# Patient Record
Sex: Female | Born: 1975 | Race: White | Hispanic: No | Smoking: Never smoker
Health system: Southern US, Community
[De-identification: ages and names within clinical notes are randomized; demographics above are authoritative.]

## PROBLEM LIST (undated history)

## (undated) ENCOUNTER — Ambulatory Visit (HOSPITAL_COMMUNITY): Admission: EM | Payer: 59

## (undated) ENCOUNTER — Emergency Department (HOSPITAL_BASED_OUTPATIENT_CLINIC_OR_DEPARTMENT_OTHER): Payer: 59

## (undated) DIAGNOSIS — J189 Pneumonia, unspecified organism: Secondary | ICD-10-CM

## (undated) DIAGNOSIS — D649 Anemia, unspecified: Secondary | ICD-10-CM

## (undated) DIAGNOSIS — R748 Abnormal levels of other serum enzymes: Secondary | ICD-10-CM

## (undated) DIAGNOSIS — F419 Anxiety disorder, unspecified: Secondary | ICD-10-CM

## (undated) DIAGNOSIS — N200 Calculus of kidney: Secondary | ICD-10-CM

## (undated) DIAGNOSIS — G43909 Migraine, unspecified, not intractable, without status migrainosus: Secondary | ICD-10-CM

## (undated) DIAGNOSIS — I10 Essential (primary) hypertension: Secondary | ICD-10-CM

## (undated) DIAGNOSIS — Z87442 Personal history of urinary calculi: Secondary | ICD-10-CM

## (undated) DIAGNOSIS — U071 COVID-19: Secondary | ICD-10-CM

## (undated) DIAGNOSIS — T7840XA Allergy, unspecified, initial encounter: Secondary | ICD-10-CM

## (undated) DIAGNOSIS — E282 Polycystic ovarian syndrome: Secondary | ICD-10-CM

## (undated) DIAGNOSIS — R011 Cardiac murmur, unspecified: Secondary | ICD-10-CM

## (undated) DIAGNOSIS — K8020 Calculus of gallbladder without cholecystitis without obstruction: Secondary | ICD-10-CM

## (undated) DIAGNOSIS — Z973 Presence of spectacles and contact lenses: Secondary | ICD-10-CM

## (undated) DIAGNOSIS — K219 Gastro-esophageal reflux disease without esophagitis: Secondary | ICD-10-CM

## (undated) DIAGNOSIS — M1990 Unspecified osteoarthritis, unspecified site: Secondary | ICD-10-CM

## (undated) HISTORY — DX: Allergy, unspecified, initial encounter: T78.40XA

## (undated) HISTORY — PX: COLPOSCOPY: SHX161

## (undated) HISTORY — DX: Polycystic ovarian syndrome: E28.2

## (undated) HISTORY — DX: Essential (primary) hypertension: I10

## (undated) HISTORY — DX: Calculus of kidney: N20.0

## (undated) HISTORY — DX: Migraine, unspecified, not intractable, without status migrainosus: G43.909

## (undated) HISTORY — DX: Morbid (severe) obesity due to excess calories: E66.01

## (undated) HISTORY — PX: CYST EXCISION: SHX5701

## (undated) HISTORY — DX: Anxiety disorder, unspecified: F41.9

## (undated) HISTORY — DX: Abnormal levels of other serum enzymes: R74.8

## (undated) HISTORY — DX: Calculus of gallbladder without cholecystitis without obstruction: K80.20

---

## 1997-09-13 ENCOUNTER — Emergency Department (HOSPITAL_COMMUNITY): Admission: EM | Admit: 1997-09-13 | Discharge: 1997-09-13 | Payer: Self-pay | Admitting: Emergency Medicine

## 1998-09-02 ENCOUNTER — Emergency Department (HOSPITAL_COMMUNITY): Admission: EM | Admit: 1998-09-02 | Discharge: 1998-09-02 | Payer: Self-pay | Admitting: *Deleted

## 1998-10-29 ENCOUNTER — Inpatient Hospital Stay (HOSPITAL_COMMUNITY): Admission: AD | Admit: 1998-10-29 | Discharge: 1998-10-29 | Payer: Self-pay | Admitting: Obstetrics

## 2002-03-26 ENCOUNTER — Emergency Department (HOSPITAL_COMMUNITY): Admission: EM | Admit: 2002-03-26 | Discharge: 2002-03-26 | Payer: Self-pay | Admitting: Emergency Medicine

## 2002-04-14 ENCOUNTER — Inpatient Hospital Stay (HOSPITAL_COMMUNITY): Admission: AD | Admit: 2002-04-14 | Discharge: 2002-04-14 | Payer: Self-pay | Admitting: *Deleted

## 2004-01-18 ENCOUNTER — Ambulatory Visit: Payer: Self-pay | Admitting: Family Medicine

## 2004-01-21 ENCOUNTER — Ambulatory Visit: Payer: Self-pay | Admitting: *Deleted

## 2004-02-02 ENCOUNTER — Ambulatory Visit: Payer: Self-pay | Admitting: Internal Medicine

## 2004-02-07 ENCOUNTER — Ambulatory Visit (HOSPITAL_COMMUNITY): Admission: RE | Admit: 2004-02-07 | Discharge: 2004-02-07 | Payer: Self-pay | Admitting: Internal Medicine

## 2004-03-03 ENCOUNTER — Ambulatory Visit: Payer: Self-pay | Admitting: Internal Medicine

## 2004-04-27 ENCOUNTER — Ambulatory Visit (HOSPITAL_COMMUNITY): Admission: RE | Admit: 2004-04-27 | Discharge: 2004-04-27 | Payer: Self-pay | Admitting: Surgery

## 2004-05-12 ENCOUNTER — Ambulatory Visit: Payer: Self-pay | Admitting: Internal Medicine

## 2004-07-05 ENCOUNTER — Ambulatory Visit: Payer: Self-pay | Admitting: Internal Medicine

## 2004-07-07 ENCOUNTER — Ambulatory Visit (HOSPITAL_COMMUNITY): Admission: RE | Admit: 2004-07-07 | Discharge: 2004-07-07 | Payer: Self-pay | Admitting: Internal Medicine

## 2004-07-11 ENCOUNTER — Ambulatory Visit: Payer: Self-pay | Admitting: Internal Medicine

## 2004-07-12 ENCOUNTER — Ambulatory Visit: Payer: Self-pay | Admitting: Internal Medicine

## 2004-07-18 ENCOUNTER — Ambulatory Visit: Payer: Self-pay | Admitting: Internal Medicine

## 2004-09-06 ENCOUNTER — Ambulatory Visit: Payer: Self-pay | Admitting: Internal Medicine

## 2005-03-13 ENCOUNTER — Ambulatory Visit: Payer: Self-pay | Admitting: Internal Medicine

## 2005-03-20 ENCOUNTER — Ambulatory Visit: Payer: Self-pay | Admitting: Internal Medicine

## 2005-03-22 ENCOUNTER — Ambulatory Visit: Payer: Self-pay | Admitting: Internal Medicine

## 2005-04-09 ENCOUNTER — Emergency Department (HOSPITAL_COMMUNITY): Admission: EM | Admit: 2005-04-09 | Discharge: 2005-04-09 | Payer: Self-pay | Admitting: Emergency Medicine

## 2005-06-08 ENCOUNTER — Ambulatory Visit: Payer: Self-pay | Admitting: Internal Medicine

## 2005-10-18 ENCOUNTER — Ambulatory Visit: Payer: Self-pay | Admitting: Internal Medicine

## 2006-01-03 ENCOUNTER — Ambulatory Visit: Payer: Self-pay | Admitting: Internal Medicine

## 2006-02-14 ENCOUNTER — Ambulatory Visit: Payer: Self-pay | Admitting: Internal Medicine

## 2006-06-03 ENCOUNTER — Ambulatory Visit: Payer: Self-pay | Admitting: Internal Medicine

## 2006-06-18 ENCOUNTER — Ambulatory Visit: Payer: Self-pay | Admitting: Internal Medicine

## 2006-06-18 LAB — CONVERTED CEMR LAB: Pap Smear: NORMAL

## 2006-06-20 ENCOUNTER — Ambulatory Visit: Payer: Self-pay | Admitting: Internal Medicine

## 2006-07-31 ENCOUNTER — Emergency Department (HOSPITAL_COMMUNITY): Admission: EM | Admit: 2006-07-31 | Discharge: 2006-07-31 | Payer: Self-pay | Admitting: Emergency Medicine

## 2006-08-21 ENCOUNTER — Emergency Department (HOSPITAL_COMMUNITY): Admission: EM | Admit: 2006-08-21 | Discharge: 2006-08-21 | Payer: Self-pay | Admitting: *Deleted

## 2006-09-27 ENCOUNTER — Encounter (INDEPENDENT_AMBULATORY_CARE_PROVIDER_SITE_OTHER): Payer: Self-pay | Admitting: Internal Medicine

## 2006-10-17 ENCOUNTER — Ambulatory Visit: Payer: Self-pay | Admitting: Internal Medicine

## 2006-11-24 ENCOUNTER — Emergency Department (HOSPITAL_COMMUNITY): Admission: EM | Admit: 2006-11-24 | Discharge: 2006-11-24 | Payer: Self-pay | Admitting: Emergency Medicine

## 2007-01-08 ENCOUNTER — Encounter (INDEPENDENT_AMBULATORY_CARE_PROVIDER_SITE_OTHER): Payer: Self-pay | Admitting: *Deleted

## 2007-03-24 ENCOUNTER — Ambulatory Visit: Payer: Self-pay | Admitting: Internal Medicine

## 2007-04-15 ENCOUNTER — Ambulatory Visit: Payer: Self-pay | Admitting: Internal Medicine

## 2007-04-16 ENCOUNTER — Encounter: Payer: Self-pay | Admitting: Internal Medicine

## 2007-04-16 LAB — CONVERTED CEMR LAB
Chlamydia, DNA Probe: NEGATIVE
GC Probe Amp, Genital: NEGATIVE

## 2007-04-24 DIAGNOSIS — G43909 Migraine, unspecified, not intractable, without status migrainosus: Secondary | ICD-10-CM | POA: Insufficient documentation

## 2007-04-24 HISTORY — DX: Migraine, unspecified, not intractable, without status migrainosus: G43.909

## 2007-06-05 ENCOUNTER — Encounter (INDEPENDENT_AMBULATORY_CARE_PROVIDER_SITE_OTHER): Payer: Self-pay | Admitting: *Deleted

## 2007-07-31 ENCOUNTER — Ambulatory Visit: Payer: Self-pay | Admitting: Family Medicine

## 2007-08-04 ENCOUNTER — Ambulatory Visit: Payer: Self-pay | Admitting: Internal Medicine

## 2007-08-06 ENCOUNTER — Ambulatory Visit: Payer: Self-pay | Admitting: Internal Medicine

## 2007-09-10 ENCOUNTER — Emergency Department (HOSPITAL_COMMUNITY): Admission: EM | Admit: 2007-09-10 | Discharge: 2007-09-10 | Payer: Self-pay | Admitting: Family Medicine

## 2007-12-19 ENCOUNTER — Emergency Department (HOSPITAL_COMMUNITY): Admission: EM | Admit: 2007-12-19 | Discharge: 2007-12-19 | Payer: Self-pay | Admitting: Emergency Medicine

## 2008-04-23 DIAGNOSIS — E282 Polycystic ovarian syndrome: Secondary | ICD-10-CM

## 2008-04-23 HISTORY — DX: Polycystic ovarian syndrome: E28.2

## 2008-05-15 ENCOUNTER — Emergency Department (HOSPITAL_COMMUNITY): Admission: EM | Admit: 2008-05-15 | Discharge: 2008-05-15 | Payer: Self-pay | Admitting: Emergency Medicine

## 2008-06-07 ENCOUNTER — Ambulatory Visit: Payer: Self-pay | Admitting: Family Medicine

## 2008-08-02 ENCOUNTER — Encounter (INDEPENDENT_AMBULATORY_CARE_PROVIDER_SITE_OTHER): Payer: Self-pay | Admitting: Family Medicine

## 2008-08-02 ENCOUNTER — Ambulatory Visit: Payer: Self-pay | Admitting: Internal Medicine

## 2008-08-02 LAB — CONVERTED CEMR LAB
ALT: 78 units/L — ABNORMAL HIGH (ref 0–35)
AST: 55 units/L — ABNORMAL HIGH (ref 0–37)
Albumin: 3.8 g/dL (ref 3.5–5.2)
Alkaline Phosphatase: 74 units/L (ref 39–117)
BUN: 11 mg/dL (ref 6–23)
CO2: 21 meq/L (ref 19–32)
Calcium: 8.9 mg/dL (ref 8.4–10.5)
Chloride: 107 meq/L (ref 96–112)
Creatinine, Ser: 0.61 mg/dL (ref 0.40–1.20)
Glucose, Bld: 114 mg/dL — ABNORMAL HIGH (ref 70–99)
Potassium: 4.1 meq/L (ref 3.5–5.3)
Sodium: 141 meq/L (ref 135–145)
Total Bilirubin: 0.2 mg/dL — ABNORMAL LOW (ref 0.3–1.2)
Total Protein: 6.4 g/dL (ref 6.0–8.3)

## 2008-08-04 ENCOUNTER — Ambulatory Visit: Payer: Self-pay | Admitting: Internal Medicine

## 2008-09-17 ENCOUNTER — Emergency Department (HOSPITAL_COMMUNITY): Admission: EM | Admit: 2008-09-17 | Discharge: 2008-09-17 | Payer: Self-pay | Admitting: Family Medicine

## 2008-10-13 ENCOUNTER — Emergency Department (HOSPITAL_COMMUNITY): Admission: EM | Admit: 2008-10-13 | Discharge: 2008-10-13 | Payer: Self-pay | Admitting: Family Medicine

## 2009-02-09 ENCOUNTER — Ambulatory Visit: Payer: Self-pay | Admitting: Internal Medicine

## 2009-02-09 ENCOUNTER — Encounter (INDEPENDENT_AMBULATORY_CARE_PROVIDER_SITE_OTHER): Payer: Self-pay | Admitting: Adult Health

## 2009-02-09 LAB — CONVERTED CEMR LAB
ALT: 152 units/L — ABNORMAL HIGH (ref 0–35)
AST: 90 units/L — ABNORMAL HIGH (ref 0–37)
Albumin: 3.9 g/dL (ref 3.5–5.2)
Alkaline Phosphatase: 76 units/L (ref 39–117)
BUN: 11 mg/dL (ref 6–23)
Basophils Absolute: 0.1 10*3/uL (ref 0.0–0.1)
Basophils Relative: 1 % (ref 0–1)
CO2: 22 meq/L (ref 19–32)
Calcium: 8.9 mg/dL (ref 8.4–10.5)
Chloride: 108 meq/L (ref 96–112)
Cholesterol: 162 mg/dL (ref 0–200)
Creatinine, Ser: 0.65 mg/dL (ref 0.40–1.20)
Eosinophils Absolute: 0.2 10*3/uL (ref 0.0–0.7)
Eosinophils Relative: 2 % (ref 0–5)
FSH: 6.7 milliintl units/mL
Glucose, Bld: 100 mg/dL — ABNORMAL HIGH (ref 70–99)
HCT: 40.6 % (ref 36.0–46.0)
HDL: 45 mg/dL (ref >39)
Hemoglobin: 12.5 g/dL (ref 12.0–15.0)
LDL Cholesterol: 81 mg/dL (ref 0–99)
LH: 7.2 milliintl units/mL
Lymphocytes Relative: 34 % (ref 12–46)
Lymphs Abs: 3 10*3/uL (ref 0.7–4.0)
MCHC: 30.8 g/dL (ref 30.0–36.0)
MCV: 88.5 fL (ref 78.0–100.0)
Microalb, Ur: 4.03 mg/dL — ABNORMAL HIGH (ref 0.00–1.89)
Monocytes Absolute: 0.5 10*3/uL (ref 0.1–1.0)
Monocytes Relative: 5 % (ref 3–12)
Neutro Abs: 5 10*3/uL (ref 1.7–7.7)
Neutrophils Relative %: 58 % (ref 43–77)
Platelets: 331 10*3/uL (ref 150–400)
Potassium: 4.1 meq/L (ref 3.5–5.3)
RBC: 4.59 M/uL (ref 3.87–5.11)
RDW: 15.3 % (ref 11.5–15.5)
Sodium: 144 meq/L (ref 135–145)
TSH: 1.924 microintl units/mL (ref 0.350–4.500)
Testosterone: 63.25 ng/dL (ref 10–70)
Total Bilirubin: 0.3 mg/dL (ref 0.3–1.2)
Total CHOL/HDL Ratio: 3.6
Total Protein: 6.9 g/dL (ref 6.0–8.3)
Triglycerides: 179 mg/dL — ABNORMAL HIGH (ref <150)
VLDL: 36 mg/dL (ref 0–40)
WBC: 8.7 10*3/uL (ref 4.0–10.5)

## 2009-02-10 ENCOUNTER — Encounter (INDEPENDENT_AMBULATORY_CARE_PROVIDER_SITE_OTHER): Payer: Self-pay | Admitting: Adult Health

## 2009-02-10 LAB — CONVERTED CEMR LAB
HCV Ab: NEGATIVE
Hep A IgM: NEGATIVE
Hep B C IgM: NEGATIVE
Hepatitis B Surface Ag: NEGATIVE

## 2009-02-21 ENCOUNTER — Ambulatory Visit (HOSPITAL_COMMUNITY): Admission: RE | Admit: 2009-02-21 | Discharge: 2009-02-21 | Payer: Self-pay | Admitting: Internal Medicine

## 2009-03-08 ENCOUNTER — Emergency Department (HOSPITAL_COMMUNITY): Admission: EM | Admit: 2009-03-08 | Discharge: 2009-03-08 | Payer: Self-pay | Admitting: Emergency Medicine

## 2009-03-16 ENCOUNTER — Ambulatory Visit: Payer: Self-pay | Admitting: Internal Medicine

## 2009-03-16 ENCOUNTER — Encounter (INDEPENDENT_AMBULATORY_CARE_PROVIDER_SITE_OTHER): Payer: Self-pay | Admitting: Adult Health

## 2009-03-16 LAB — CONVERTED CEMR LAB
Chlamydia, DNA Probe: NEGATIVE
GC Probe Amp, Genital: NEGATIVE

## 2009-06-22 ENCOUNTER — Emergency Department (HOSPITAL_COMMUNITY): Admission: EM | Admit: 2009-06-22 | Discharge: 2009-06-22 | Payer: Self-pay | Admitting: Family Medicine

## 2009-07-01 ENCOUNTER — Ambulatory Visit: Payer: Self-pay | Admitting: Adult Health

## 2009-07-25 ENCOUNTER — Emergency Department (HOSPITAL_COMMUNITY): Admission: EM | Admit: 2009-07-25 | Discharge: 2009-07-26 | Payer: Self-pay | Admitting: Emergency Medicine

## 2009-07-28 ENCOUNTER — Emergency Department (HOSPITAL_COMMUNITY): Admission: EM | Admit: 2009-07-28 | Discharge: 2009-07-28 | Payer: Self-pay | Admitting: Family Medicine

## 2009-08-01 ENCOUNTER — Ambulatory Visit: Payer: Self-pay | Admitting: Internal Medicine

## 2009-08-01 ENCOUNTER — Encounter (INDEPENDENT_AMBULATORY_CARE_PROVIDER_SITE_OTHER): Payer: Self-pay | Admitting: Adult Health

## 2009-09-13 ENCOUNTER — Ambulatory Visit: Payer: Self-pay | Admitting: Internal Medicine

## 2009-09-13 ENCOUNTER — Encounter (INDEPENDENT_AMBULATORY_CARE_PROVIDER_SITE_OTHER): Payer: Self-pay | Admitting: Adult Health

## 2009-09-13 LAB — CONVERTED CEMR LAB: Preg, Serum: NEGATIVE

## 2009-09-21 ENCOUNTER — Ambulatory Visit (HOSPITAL_COMMUNITY): Admission: RE | Admit: 2009-09-21 | Discharge: 2009-09-21 | Payer: Self-pay | Admitting: Internal Medicine

## 2009-10-26 ENCOUNTER — Ambulatory Visit: Payer: Self-pay | Admitting: Internal Medicine

## 2009-10-28 ENCOUNTER — Ambulatory Visit: Payer: Self-pay | Admitting: Internal Medicine

## 2009-12-23 ENCOUNTER — Emergency Department (HOSPITAL_COMMUNITY)
Admission: EM | Admit: 2009-12-23 | Discharge: 2009-12-23 | Payer: Self-pay | Source: Home / Self Care | Admitting: Emergency Medicine

## 2010-02-07 ENCOUNTER — Encounter (INDEPENDENT_AMBULATORY_CARE_PROVIDER_SITE_OTHER): Payer: Self-pay | Admitting: *Deleted

## 2010-02-07 LAB — CONVERTED CEMR LAB
Basophils Absolute: 0 10*3/uL (ref 0.0–0.1)
Basophils Relative: 0 % (ref 0–1)
Eosinophils Absolute: 0.1 10*3/uL (ref 0.0–0.7)
Eosinophils Relative: 1 % (ref 0–5)
FSH: 3.4 milliintl units/mL
HCT: 37.2 % (ref 36.0–46.0)
Hemoglobin: 11.9 g/dL — ABNORMAL LOW (ref 12.0–15.0)
LH: 9.2 milliintl units/mL
Lymphocytes Relative: 34 % (ref 12–46)
Lymphs Abs: 3.1 10*3/uL (ref 0.7–4.0)
MCHC: 32 g/dL (ref 30.0–36.0)
MCV: 88.4 fL (ref 78.0–100.0)
Monocytes Absolute: 0.4 10*3/uL (ref 0.1–1.0)
Monocytes Relative: 5 % (ref 3–12)
Neutro Abs: 5.5 10*3/uL (ref 1.7–7.7)
Neutrophils Relative %: 59 % (ref 43–77)
Platelets: 282 10*3/uL (ref 150–400)
Prolactin: 6.2 ng/mL
RBC: 4.21 M/uL (ref 3.87–5.11)
RDW: 15 % (ref 11.5–15.5)
TSH: 2.259 microintl units/mL (ref 0.350–4.500)
Testosterone: 43.73 ng/dL (ref 10–70)
WBC: 9.2 10*3/uL (ref 4.0–10.5)

## 2010-04-19 ENCOUNTER — Emergency Department (HOSPITAL_COMMUNITY)
Admission: EM | Admit: 2010-04-19 | Discharge: 2010-04-19 | Payer: Self-pay | Source: Home / Self Care | Admitting: Family Medicine

## 2010-04-23 DIAGNOSIS — E1165 Type 2 diabetes mellitus with hyperglycemia: Secondary | ICD-10-CM | POA: Insufficient documentation

## 2010-07-03 LAB — GLUCOSE, CAPILLARY: Glucose-Capillary: 188 mg/dL — ABNORMAL HIGH (ref 70–99)

## 2010-07-12 LAB — POCT URINALYSIS DIP (DEVICE)
Bilirubin Urine: NEGATIVE
Glucose, UA: NEGATIVE mg/dL
Ketones, ur: NEGATIVE mg/dL
Nitrite: NEGATIVE
Protein, ur: NEGATIVE mg/dL
Specific Gravity, Urine: 1.025 (ref 1.005–1.030)
Urobilinogen, UA: 0.2 mg/dL (ref 0.0–1.0)
pH: 5.5 (ref 5.0–8.0)

## 2010-07-12 LAB — URINALYSIS, ROUTINE W REFLEX MICROSCOPIC
Bilirubin Urine: NEGATIVE
Glucose, UA: NEGATIVE mg/dL
Ketones, ur: NEGATIVE mg/dL
Nitrite: NEGATIVE
Protein, ur: NEGATIVE mg/dL
Specific Gravity, Urine: 1.022 (ref 1.005–1.030)
Urobilinogen, UA: 0.2 mg/dL (ref 0.0–1.0)
pH: 5 (ref 5.0–8.0)

## 2010-07-12 LAB — URINE MICROSCOPIC-ADD ON

## 2010-07-12 LAB — POCT PREGNANCY, URINE
Preg Test, Ur: NEGATIVE
Preg Test, Ur: NEGATIVE

## 2010-07-31 LAB — POCT URINALYSIS DIP (DEVICE)
Bilirubin Urine: NEGATIVE
Glucose, UA: NEGATIVE mg/dL
Ketones, ur: NEGATIVE mg/dL
Nitrite: NEGATIVE
Protein, ur: NEGATIVE mg/dL
Specific Gravity, Urine: 1.015 (ref 1.005–1.030)
Urobilinogen, UA: 0.2 mg/dL (ref 0.0–1.0)
pH: 6 (ref 5.0–8.0)

## 2010-07-31 LAB — POCT PREGNANCY, URINE: Preg Test, Ur: NEGATIVE

## 2010-08-07 LAB — POCT RAPID STREP A (OFFICE): Streptococcus, Group A Screen (Direct): NEGATIVE

## 2010-08-07 LAB — STREP A DNA PROBE

## 2010-08-09 ENCOUNTER — Other Ambulatory Visit (HOSPITAL_COMMUNITY): Payer: Self-pay | Admitting: Family Medicine

## 2010-08-09 DIAGNOSIS — N200 Calculus of kidney: Secondary | ICD-10-CM

## 2010-08-11 ENCOUNTER — Other Ambulatory Visit (HOSPITAL_COMMUNITY): Payer: Self-pay

## 2010-08-15 ENCOUNTER — Ambulatory Visit (HOSPITAL_COMMUNITY)
Admission: RE | Admit: 2010-08-15 | Discharge: 2010-08-15 | Disposition: A | Discharge status: Home / Self Care | Payer: Self-pay | Source: Ambulatory Visit | Attending: Family Medicine | Admitting: Family Medicine

## 2010-08-15 DIAGNOSIS — N201 Calculus of ureter: Secondary | ICD-10-CM | POA: Insufficient documentation

## 2010-08-15 DIAGNOSIS — M47817 Spondylosis without myelopathy or radiculopathy, lumbosacral region: Secondary | ICD-10-CM | POA: Insufficient documentation

## 2010-08-15 DIAGNOSIS — N200 Calculus of kidney: Secondary | ICD-10-CM

## 2010-08-15 DIAGNOSIS — R109 Unspecified abdominal pain: Secondary | ICD-10-CM | POA: Insufficient documentation

## 2010-10-04 ENCOUNTER — Emergency Department (HOSPITAL_COMMUNITY)
Admission: EM | Admit: 2010-10-04 | Discharge: 2010-10-05 | Discharge status: Against Medical Advice | Payer: Self-pay | Attending: Emergency Medicine | Admitting: Emergency Medicine

## 2010-10-05 ENCOUNTER — Encounter (HOSPITAL_COMMUNITY): Payer: Self-pay

## 2010-10-05 ENCOUNTER — Emergency Department (HOSPITAL_COMMUNITY)
Admission: EM | Admit: 2010-10-05 | Discharge: 2010-10-05 | Disposition: A | Discharge status: Home / Self Care | Payer: Self-pay | Attending: Emergency Medicine | Admitting: Emergency Medicine

## 2010-10-05 ENCOUNTER — Emergency Department (HOSPITAL_COMMUNITY): Payer: Self-pay

## 2010-10-05 DIAGNOSIS — R112 Nausea with vomiting, unspecified: Secondary | ICD-10-CM | POA: Insufficient documentation

## 2010-10-05 DIAGNOSIS — R109 Unspecified abdominal pain: Secondary | ICD-10-CM | POA: Insufficient documentation

## 2010-10-05 DIAGNOSIS — N200 Calculus of kidney: Secondary | ICD-10-CM | POA: Insufficient documentation

## 2010-10-05 DIAGNOSIS — E119 Type 2 diabetes mellitus without complications: Secondary | ICD-10-CM | POA: Insufficient documentation

## 2010-10-05 DIAGNOSIS — J45909 Unspecified asthma, uncomplicated: Secondary | ICD-10-CM | POA: Insufficient documentation

## 2010-10-05 DIAGNOSIS — Z87442 Personal history of urinary calculi: Secondary | ICD-10-CM | POA: Insufficient documentation

## 2010-10-05 LAB — URINALYSIS, ROUTINE W REFLEX MICROSCOPIC
Bilirubin Urine: NEGATIVE
Glucose, UA: 500 mg/dL — AB
Hgb urine dipstick: NEGATIVE
Protein, ur: NEGATIVE mg/dL
pH: 7.5 (ref 5.0–8.0)

## 2010-10-05 LAB — CBC
HCT: 37.6 % (ref 36.0–46.0)
Hemoglobin: 12.4 g/dL (ref 12.0–15.0)
MCH: 27.3 pg (ref 26.0–34.0)
MCHC: 33 g/dL (ref 30.0–36.0)
MCV: 82.6 fL (ref 78.0–100.0)
RBC: 4.55 MIL/uL (ref 3.87–5.11)

## 2010-10-05 LAB — DIFFERENTIAL
Lymphocytes Relative: 17 % (ref 12–46)
Lymphs Abs: 2.3 10*3/uL (ref 0.7–4.0)
Monocytes Absolute: 0.5 10*3/uL (ref 0.1–1.0)
Monocytes Relative: 3 % (ref 3–12)
Neutro Abs: 11 10*3/uL — ABNORMAL HIGH (ref 1.7–7.7)
Neutrophils Relative %: 80 % — ABNORMAL HIGH (ref 43–77)

## 2010-10-05 LAB — URINE MICROSCOPIC-ADD ON

## 2010-10-05 LAB — POCT I-STAT, CHEM 8
BUN: 13 mg/dL (ref 6–23)
Creatinine, Ser: 0.8 mg/dL (ref 0.4–1.2)
Glucose, Bld: 256 mg/dL — ABNORMAL HIGH (ref 70–99)
Hemoglobin: 13.3 g/dL (ref 12.0–15.0)
Potassium: 4.1 mEq/L (ref 3.5–5.1)
Sodium: 137 mEq/L (ref 135–145)

## 2010-12-14 ENCOUNTER — Other Ambulatory Visit (HOSPITAL_COMMUNITY): Payer: Self-pay | Admitting: Family Medicine

## 2010-12-14 DIAGNOSIS — N838 Other noninflammatory disorders of ovary, fallopian tube and broad ligament: Secondary | ICD-10-CM

## 2010-12-18 ENCOUNTER — Ambulatory Visit (HOSPITAL_COMMUNITY)
Admission: RE | Admit: 2010-12-18 | Discharge: 2010-12-18 | Disposition: A | Discharge status: Home / Self Care | Payer: Self-pay | Source: Ambulatory Visit | Attending: Family Medicine | Admitting: Family Medicine

## 2010-12-18 DIAGNOSIS — N838 Other noninflammatory disorders of ovary, fallopian tube and broad ligament: Secondary | ICD-10-CM | POA: Insufficient documentation

## 2010-12-24 ENCOUNTER — Inpatient Hospital Stay (INDEPENDENT_AMBULATORY_CARE_PROVIDER_SITE_OTHER)
Admission: RE | Admit: 2010-12-24 | Discharge: 2010-12-24 | Disposition: A | Discharge status: Home / Self Care | Payer: Self-pay | Source: Ambulatory Visit | Attending: Family Medicine | Admitting: Family Medicine

## 2010-12-24 DIAGNOSIS — N3090 Cystitis, unspecified without hematuria: Secondary | ICD-10-CM

## 2010-12-24 LAB — POCT PREGNANCY, URINE: Preg Test, Ur: NEGATIVE

## 2010-12-24 LAB — POCT URINALYSIS DIP (DEVICE)
Glucose, UA: 100 mg/dL — AB
Ketones, ur: 15 mg/dL — AB
Leukocytes, UA: NEGATIVE
Protein, ur: >=300 mg/dL — AB

## 2010-12-26 LAB — URINE CULTURE: Culture  Setup Time: 201209031201

## 2010-12-27 ENCOUNTER — Inpatient Hospital Stay (INDEPENDENT_AMBULATORY_CARE_PROVIDER_SITE_OTHER)
Admission: RE | Admit: 2010-12-27 | Discharge: 2010-12-27 | Disposition: A | Discharge status: Home / Self Care | Payer: Self-pay | Source: Ambulatory Visit | Attending: Emergency Medicine | Admitting: Emergency Medicine

## 2010-12-27 DIAGNOSIS — N200 Calculus of kidney: Secondary | ICD-10-CM

## 2010-12-27 LAB — POCT I-STAT, CHEM 8
Chloride: 104 mEq/L (ref 96–112)
Creatinine, Ser: 0.7 mg/dL (ref 0.50–1.10)
Glucose, Bld: 215 mg/dL — ABNORMAL HIGH (ref 70–99)
Potassium: 3.7 mEq/L (ref 3.5–5.1)

## 2010-12-27 LAB — POCT URINALYSIS DIP (DEVICE)
Bilirubin Urine: NEGATIVE
Glucose, UA: NEGATIVE mg/dL
Specific Gravity, Urine: >=1.03 (ref 1.005–1.030)
Urobilinogen, UA: 0.2 mg/dL (ref 0.0–1.0)
pH: 5.5 (ref 5.0–8.0)

## 2010-12-28 ENCOUNTER — Ambulatory Visit (HOSPITAL_COMMUNITY)
Admission: RE | Admit: 2010-12-28 | Discharge: 2010-12-28 | Disposition: A | Discharge status: Home / Self Care | Payer: Self-pay | Source: Ambulatory Visit | Attending: Urology | Admitting: Urology

## 2010-12-28 ENCOUNTER — Other Ambulatory Visit (HOSPITAL_COMMUNITY): Payer: Self-pay | Admitting: Urology

## 2010-12-28 DIAGNOSIS — N1330 Unspecified hydronephrosis: Secondary | ICD-10-CM | POA: Insufficient documentation

## 2010-12-28 DIAGNOSIS — R319 Hematuria, unspecified: Secondary | ICD-10-CM | POA: Insufficient documentation

## 2010-12-28 DIAGNOSIS — N200 Calculus of kidney: Secondary | ICD-10-CM

## 2010-12-28 DIAGNOSIS — N83209 Unspecified ovarian cyst, unspecified side: Secondary | ICD-10-CM | POA: Insufficient documentation

## 2010-12-28 DIAGNOSIS — R109 Unspecified abdominal pain: Secondary | ICD-10-CM | POA: Insufficient documentation

## 2011-02-22 ENCOUNTER — Ambulatory Visit (INDEPENDENT_AMBULATORY_CARE_PROVIDER_SITE_OTHER): Payer: Self-pay | Admitting: Obstetrics & Gynecology

## 2011-02-22 ENCOUNTER — Encounter: Payer: Self-pay | Admitting: Obstetrics & Gynecology

## 2011-02-22 VITALS — BP 144/95 | HR 89 | Temp 97.2°F | Ht 66.0 in | Wt 295.1 lb

## 2011-02-22 DIAGNOSIS — N926 Irregular menstruation, unspecified: Secondary | ICD-10-CM | POA: Insufficient documentation

## 2011-02-22 MED ORDER — NORGESTIMATE-ETH ESTRADIOL 0.25-35 MG-MCG PO TABS: 1.0000 | ORAL_TABLET | Freq: Every day | ORAL | Status: DC

## 2011-02-22 NOTE — Patient Instructions (Signed)
Polycystic Ovarian Syndrome Polycystic ovarian syndrome is a condition with a number of problems. One problem is with the ovaries. The ovaries are organs located in the female pelvis, on each side of the uterus. Usually, during the menstrual cycle, an egg is released from 1 ovary every month. This is called ovulation. When the egg is fertilized, it goes into the womb (uterus), which allows for the growth of a baby. The egg travels from the ovary through the fallopian tube to the uterus. The ovaries also make the hormones estrogen and progesterone. These hormones help the development of a woman's breasts, body shape, and body hair. They also regulate the menstrual cycle and pregnancy. Sometimes, cysts form in the ovaries. A cyst is a fluid-filled sac. On the ovary, different types of cysts can form. The most common type of ovarian cyst is called a functional or ovulation cyst. It is normal, and often forms during the normal menstrual cycle. Each month, a woman's ovaries grow tiny cysts that hold the eggs. When an egg is fully grown, the sac breaks open. This releases the egg. Then, the sac which released the egg from the ovary dissolves. In one type of functional cyst, called a follicle cyst, the sac does not break open to release the egg. It may actually continue to grow. This type of cyst usually disappears within 1 to 3 months.  One type of cyst problem with the ovaries is called Polycystic Ovarian Syndrome (PCOS). In this condition, many follicle cysts form, but do not rupture and produce an egg. This health problem can affect the following:  Menstrual cycle.   Heart.   Obesity.   Cancer of the uterus.   Fertility.   Blood vessels.   Hair growth (face and body) or baldness.   Hormones.   Appearance.   High blood pressure.   Stroke.   Insulin production.   Inflammation of the liver.   Elevated blood cholesterol and triglycerides.  CAUSES   No one knows the exact cause of PCOS.    Women with PCOS often have a mother or sister with PCOS. There is not yet enough proof to say this is inherited.   Many women with PCOS have a weight problem.   Researchers are looking at the relationship between PCOS and the body's ability to make insulin. Insulin is a hormone that regulates the change of sugar, starches, and other food into energy for the body's use, or for storage. Some women with PCOS make too much insulin. It is possible that the ovaries react by making too many female hormones, called androgens. This can lead to acne, excessive hair growth, weight gain, and ovulation problems.   Too much production of luteinizing hormone (LH) from the pituitary gland in the brain stimulates the ovary to produce too much female hormone (androgen).  SYMPTOMS   Infrequent or no menstrual periods, and/or irregular bleeding.   Inability to get pregnant (infertility), because of not ovulating.   Increased growth of hair on the face, chest, stomach, back, thumbs, thighs, or toes.   Acne, oily skin, or dandruff.   Pelvic pain.   Weight gain or obesity, usually carrying extra weight around the waist.   Type 2 diabetes (this is the diabetes that usually does not need insulin).   High cholesterol.   High blood pressure.   Female-pattern baldness or thinning hair.   Patches of thickened and dark brown or black skin on the neck, arms, breasts, or thighs.   Skin tags,   or tiny excess flaps of skin, in the armpits or neck area.   Sleep apnea (excessive snoring and breathing stops at times while asleep).   Deepening of the voice.   Gestational diabetes when pregnant.   Increased risk of miscarriage with pregnancy.  DIAGNOSIS  There is no single test to diagnose PCOS.   Your caregiver will:   Take a medical history.   Perform a pelvic exam.   Perform an ultrasound.   Check your female and female hormone levels.   Measure glucose or sugar levels in the blood.   Do other blood  tests.   If you are producing too many female hormones, your caregiver will make sure it is from PCOS. At the physical exam, your caregiver will want to evaluate the areas of increased hair growth. Try to allow natural hair growth for a few days before the visit.   During a pelvic exam, the ovaries may be enlarged or swollen by the increased number of small cysts. This can be seen more easily by vaginal ultrasound or screening, to examine the ovaries and lining of the uterus (endometrium) for cysts. The uterine lining may become thicker, if there has not been a regular period.  TREATMENT  Because there is no cure for PCOS, it needs to be managed to prevent problems. Treatments are based on your symptoms. Treatment is also based on whether you want to have a baby or whether you need contraception.  Treatment may include:  Progesterone hormone, to start a menstrual period.   Birth control pills, to make you have regular menstrual periods.   Medicines to make you ovulate, if you want to get pregnant.   Medicines to control your insulin.   Medicine to control your blood pressure.   Medicine and diet, to control your high cholesterol and triglycerides in your blood.   Surgery, making small holes in the ovary, to decrease the amount of female hormone production. This is done through a long, lighted tube (laparoscope), placed into the pelvis through a tiny incision in the lower abdomen.  Your caregiver will go over some of the choices with you. WOMEN WITH PCOS HAVE THESE CHARACTERISTICS:  High levels of female hormones called androgens.   An irregular or no menstrual cycle.   May have many small cysts in their ovaries.  PCOS is the most common hormonal reproductive problem in women of childbearing age. WHY DO WOMEN WITH PCOS HAVE TROUBLE WITH THEIR MENSTRUAL CYCLE? Each month, about 20 eggs start to mature in the ovaries. As one egg grows and matures, the follicle breaks open to release the egg,  so it can travel through the fallopian tube for fertilization. When the single egg leaves the follicle, ovulation takes place. In women with PCOS, the ovary does not make all of the hormones it needs for any of the eggs to fully mature. They may start to grow and accumulate fluid, but no one egg becomes large enough. Instead, some may remain as cysts. Since no egg matures or is released, ovulation does not occur and the hormone progesterone is not made. Without progesterone, a woman's menstrual cycle is irregular or absent. Also, the cysts produce female hormones, which continue to prevent ovulation.  Document Released: 08/03/2004 Document Revised: 12/20/2010 Document Reviewed: 02/25/2009 [employer] Patient Information 2012 [employer]. 

## 2011-02-22 NOTE — Progress Notes (Signed)
History:  35 y.o. [id number] here today for evaluation for possible PCOS; was referred from [facility].  She reports menarche at age 53, regular menses until age 71 when she was on high doses of prednisone for asthma.  Since then, she had oligomenorrhea, having as many as 5-6 months between periods.  She also gained a lot of weight and was diagnosed with prediabetes and started on Metformin.  She had a pelvic ultrasound which did not show polycystic ovaries (see below).  Patient is otherwise up-to-date with GYN health maintenance.  The following portions of the patient's history were reviewed and updated as appropriate: allergies, current medications, past family history, past medical history, past social history, past surgical history and problem list.   Objective:  Physical Exam Blood pressure 144/95, pulse 89, temperature 97.2 F (36.2 C), temperature source Oral, height 5\' 6"  (1.676 m), weight 295 lb 1.6 oz (133.856 kg), last menstrual period 02/07/2011. Gen: NAD. No evidence of acne or hirsutism. Abd: Soft/NT/ND, obese Pelvic: Normal appearing external genitalia; normal appearing vaginal mucosa and cervix.  Unable to palpate uterus or adnexa secondary to habitus.  Labs and Imaging 12/18/10 TRANSABDOMINAL AND TRANSVAGINAL ULTRASOUND OF PELVIS  Findings: Uterus: 8.8 x 6.5 x 5.7 cm. Uterus is retroverted. No myometrial masses are identified.  Endometrium: 13 mm double layer thickness measured transvaginally. No focal lesion visualized.  Right ovary: 5.1 x 3.3 x 3.5 cm. 3.5 cm simple cyst with benign characteristics, which is likely physiologic. No complex cyst or solid masses identified.  Left ovary: 4.0 x 2.5 x 3.2 cm. Normal appearance. No adnexal mass identified.  Other Findings: No free fluid  IMPRESSION: 1. Normal and appearance of left ovary. 2. 3.5 cm simple right ovarian cyst with benign characteristics, most likely physiologic. No further imaging followup required. 3. Normal retroverted  uterus. No evidence of fibroids.   Assessment & Plan:   For workup of her irregular menses, will check FSH (for ovarian reserve), PRL, TSH, total testosterone, DHEAS (markers for hyperandrogenic state) and HgA1C.  Will also e-prescribe Sprintec for patient for regulation of periods.  Will follow up labs and manage accordingly.   If she has PCOS by lab criteria, she is already on Metformin (needs 1500 mg for treatment of PCOS) and will be on Sprintec.   Bleeding precautions reviewed.

## 2011-02-23 LAB — TESTOSTERONE, FREE, TOTAL, SHBG
Sex Hormone Binding: 49 nmol/L (ref 18–114)
Testosterone, Free: 9.1 pg/mL — ABNORMAL HIGH (ref 0.6–6.8)
Testosterone: 64.34 ng/dL (ref 10–70)

## 2011-02-23 LAB — HEMOGLOBIN A1C
Hgb A1c MFr Bld: 7.7 % — ABNORMAL HIGH (ref <5.7)
Mean Plasma Glucose: 174 mg/dL — ABNORMAL HIGH (ref <117)

## 2011-03-26 ENCOUNTER — Telehealth: Payer: Self-pay | Admitting: *Deleted

## 2011-03-26 NOTE — Telephone Encounter (Signed)
Called patient back , states started sprintec on first day of period as instructed on 03/07/11, since then period at first changed to spotting for a few days, then regular period, then tapered off , and then restarted as a heavy period with passing lots of clots.  Patient states has gone thru 48 pads- is changing every hour or so and they are completely saturated. Also c/o headaches for 2-3 days. Instructed patient to come to [facility] for evaluation- pt. Denies dizziness or weakness. Patient voices understanding of plan.

## 2011-03-26 NOTE — Telephone Encounter (Signed)
Patient called and left a message on the nurse voicemail today at 0913 stating she saw a doctor about a month ago and they prescribed Sprintec. States she started her period about 3 weeks ago and it is now heavy with clots and she feels bad. Wants to know what to do.

## 2011-04-12 ENCOUNTER — Ambulatory Visit (INDEPENDENT_AMBULATORY_CARE_PROVIDER_SITE_OTHER): Payer: Self-pay | Admitting: Obstetrics & Gynecology

## 2011-04-12 ENCOUNTER — Encounter: Payer: Self-pay | Admitting: Obstetrics & Gynecology

## 2011-04-12 DIAGNOSIS — Z01419 Encounter for gynecological examination (general) (routine) without abnormal findings: Secondary | ICD-10-CM

## 2011-04-12 DIAGNOSIS — R300 Dysuria: Secondary | ICD-10-CM

## 2011-04-12 DIAGNOSIS — E282 Polycystic ovarian syndrome: Secondary | ICD-10-CM

## 2011-04-12 DIAGNOSIS — R309 Painful micturition, unspecified: Secondary | ICD-10-CM

## 2011-04-12 DIAGNOSIS — Z0000 Encounter for general adult medical examination without abnormal findings: Secondary | ICD-10-CM

## 2011-04-12 LAB — POCT URINALYSIS DIP (DEVICE)
Glucose, UA: >=1000 mg/dL — AB
Hgb urine dipstick: NEGATIVE
Specific Gravity, Urine: 1.025 (ref 1.005–1.030)
Urobilinogen, UA: 0.2 mg/dL (ref 0.0–1.0)
pH: 5.5 (ref 5.0–8.0)

## 2011-04-12 MED ORDER — METFORMIN HCL 500 MG PO TABS: ORAL_TABLET | ORAL | Status: DC

## 2011-04-12 NOTE — Progress Notes (Signed)
  Subjective:    Patient ID: Lori Shea, female    DOB: 1975/08/17, 35 y.o.   MRN: [id number]  HPI Lori Shea is here to discuss her labs. She also needs a pap smear. She has had some BTB with the OCPs but is now doing ok. She complains of some dysuria.   Review of Systems    Her free testosterone is elevated and her HBA1C is 7.7. Objective:   Physical Exam   Normal cervix and discharge on exam. I am unable to distinctly palpate her ovaries due to her centripital obesity.     Assessment & Plan:  PCOS- She should continue her OCPs and increase her Metformin to 1500 mg per day.  If she continues with BTB she could change to a different type of OCP or use the Mirena IUD. I did a pap today.

## 2011-04-14 LAB — URINE CULTURE: Colony Count: 60000

## 2011-06-11 ENCOUNTER — Emergency Department (INDEPENDENT_AMBULATORY_CARE_PROVIDER_SITE_OTHER)
Admission: EM | Admit: 2011-06-11 | Discharge: 2011-06-11 | Disposition: A | Discharge status: Home / Self Care | Payer: Self-pay | Source: Home / Self Care | Attending: Emergency Medicine | Admitting: Emergency Medicine

## 2011-06-11 ENCOUNTER — Encounter (HOSPITAL_COMMUNITY): Payer: Self-pay | Admitting: *Deleted

## 2011-06-11 DIAGNOSIS — N390 Urinary tract infection, site not specified: Secondary | ICD-10-CM

## 2011-06-11 DIAGNOSIS — N760 Acute vaginitis: Secondary | ICD-10-CM

## 2011-06-11 LAB — WET PREP, GENITAL

## 2011-06-11 LAB — POCT URINALYSIS DIP (DEVICE)
Glucose, UA: NEGATIVE mg/dL
Nitrite: NEGATIVE
Urobilinogen, UA: 0.2 mg/dL (ref 0.0–1.0)

## 2011-06-11 LAB — POCT PREGNANCY, URINE: Preg Test, Ur: NEGATIVE

## 2011-06-11 MED ORDER — FLUCONAZOLE 150 MG PO TABS: 150.0000 mg | ORAL_TABLET | Freq: Once | ORAL | Status: AC

## 2011-06-11 MED ORDER — PHENAZOPYRIDINE HCL 200 MG PO TABS: 200.0000 mg | ORAL_TABLET | Freq: Three times a day (TID) | ORAL | Status: AC | PRN

## 2011-06-11 MED ORDER — CEPHALEXIN 500 MG PO CAPS: 500.0000 mg | ORAL_CAPSULE | Freq: Three times a day (TID) | ORAL | Status: AC

## 2011-06-11 NOTE — ED Notes (Signed)
Pt is here with complaints of painful urination with lower abdominal pain and lower back pain.  Pt also reports vaginal itching.  Pt is currently being treated for kidney stones by [facility].

## 2011-06-11 NOTE — ED Provider Notes (Signed)
Chief Complaint  Patient presents with  . Urinary Tract Infection    History of Present Illness:   The patient has had a 3 to four-day history of dysuria, frequency, and urgency. She denies any hematuria or strong smelling urine. She's also had mild pain in her lower back, bladder area, and vaginal pain. She's had some discharge and some itching but no odor. She denies any abnormal vaginal bleeding. Last menstrual period was February 4. She is not sexually active, but does take Sprintec for cycle control. She has a history of urinary tract infections and stones in the past. No history of vaginitis or STDs.  Review of Systems:  Other than noted above, the patient denies any of the following symptoms: General:  No fevers, chills, sweats, aches, or fatigue. GI:  No abdominal pain, back pain, nausea, vomiting, diarrhea, or constipation. GU:  No dysuria, frequency, urgency, hematuria, or incontinence. GYN:  No discharge, itching, vulvar pain or lesions, pelvic pain, or abnormal vaginal bleeding.  PMFSH:  Past medical history, family history, social history, meds, and allergies were reviewed.  Physical Exam:   Vital signs:  BP 146/96  Pulse 92  Temp(Src) 97.4 F (36.3 C) (Oral)  Resp 18  SpO2 99%  LMP 05/28/2011 Gen:  Alert, oriented, in no distress. Lungs:  Clear to auscultation, no wheezes, rales or rhonchi. Heart:  Regular rhythm, no gallop or murmer. Abdomen:  Flat and soft. There was slight suprapubic pain to palpation.  No guarding, or rebound.  No hepato-splenomegaly or mass.  Bowel sounds were normally active.  No hernia. Pelvic exam:  Pelvic exam reveals normal external genitalia. She has some yellowish, mucoid discharge coming from the cervical os and there was some cervical erosions. There was no pain on cervical motion. Uterus was normal in size and shape and was nontender. She has mild bilateral adnexal tenderness without any masses. Back:  No CVA tenderness.  Skin:  Clear, warm  and dry.  Labs:   Results for orders placed during the hospital encounter of 06/11/11  POCT URINALYSIS DIP (DEVICE)      Component Value Range   Glucose, UA NEGATIVE  NEGATIVE (mg/dL)   Bilirubin Urine SMALL (*) NEGATIVE    Ketones, ur TRACE (*) NEGATIVE (mg/dL)   Specific Gravity, Urine >=1.030  1.005 - 1.030    Hgb urine dipstick TRACE (*) NEGATIVE    pH 5.5  5.0 - 8.0    Protein, ur 30 (*) NEGATIVE (mg/dL)   Urobilinogen, UA 0.2  0.0 - 1.0 (mg/dL)   Nitrite NEGATIVE  NEGATIVE    Leukocytes, UA TRACE (*) NEGATIVE   POCT PREGNANCY, URINE      Component Value Range   Preg Test, Ur NEGATIVE  NEGATIVE   WET PREP, GENITAL      Component Value Range   Yeast Wet Prep HPF POC NONE SEEN  NONE SEEN    Trich, Wet Prep NONE SEEN  NONE SEEN    Clue Cells Wet Prep HPF POC FEW (*) NONE SEEN    WBC, Wet Prep HPF POC TOO NUMEROUS TO COUNT (*) NONE SEEN      Assessment:  Diagnoses that have been ruled out:  None  Diagnoses that are still under consideration:  None  Final diagnoses:  UTI (lower urinary tract infection)  Vaginitis     Plan:   1.  The following meds were prescribed:   New Prescriptions   CEPHALEXIN (KEFLEX) 500 MG CAPSULE    Take 1 capsule (500  mg total) by mouth 3 (three) times daily.   FLUCONAZOLE (DIFLUCAN) 150 MG TABLET    Take 1 tablet (150 mg total) by mouth once.   PHENAZOPYRIDINE (PYRIDIUM) 200 MG TABLET    Take 1 tablet (200 mg total) by mouth 3 (three) times daily as needed for pain.   2.  The patient was instructed in symptomatic care and handouts were given. 3.  The patient was told to return if becoming worse in any way, if no better in 3 or 4 days, and given some red flag symptoms that would indicate earlier return. 4.  The patient was told to avoid intercourse for 10 days, get extra fluids, and return for a follow up with her primary care doctor at the completion of treatment for a repeat UA and culture.     Roque Lias, MD 06/11/11 1051

## 2011-06-11 NOTE — Discharge Instructions (Signed)

## 2011-06-12 LAB — GC/CHLAMYDIA PROBE AMP, GENITAL: GC Probe Amp, Genital: NEGATIVE

## 2011-06-12 LAB — URINE CULTURE
Culture: NO GROWTH
Special Requests: NORMAL

## 2011-06-14 ENCOUNTER — Telehealth (HOSPITAL_COMMUNITY): Payer: Self-pay | Admitting: *Deleted

## 2011-06-14 MED ORDER — METRONIDAZOLE 500 MG PO TABS: 500.0000 mg | ORAL_TABLET | Freq: Two times a day (BID) | ORAL | Status: AC

## 2011-06-14 NOTE — ED Notes (Signed)
GC/Chlamydia neg., Urine culture: no growth, Wet prep: few clue cells and WBC's TNTC. Message sent to Dr. Lorenz Coaster and he e-prescribed Rx. of Flagyl to [facility] on [address]. I called pt. and verified x 2. Pt. given results and told about Rx. at [facility]. Pt. instructed no alcohol while taking the medication.  Pt. c/o severe nausea and headache and rash on her buttocks since Mon. Discussed with Dr. Lorenz Coaster. Pt. advised to come back in for recheck if getting worse. Doctor will need to look at the rash to know what to prescribe. Pt. voiced understanding. Lori Shea 06/14/2011

## 2012-07-08 ENCOUNTER — Other Ambulatory Visit (HOSPITAL_COMMUNITY)
Admission: RE | Admit: 2012-07-08 | Discharge: 2012-07-08 | Disposition: A | Discharge status: Home / Self Care | Payer: Self-pay | Source: Ambulatory Visit | Attending: Family Medicine | Admitting: Family Medicine

## 2012-07-08 ENCOUNTER — Other Ambulatory Visit: Payer: Self-pay | Admitting: Physician Assistant

## 2012-07-08 DIAGNOSIS — Z124 Encounter for screening for malignant neoplasm of cervix: Secondary | ICD-10-CM | POA: Insufficient documentation

## 2013-04-23 DIAGNOSIS — I10 Essential (primary) hypertension: Secondary | ICD-10-CM

## 2013-04-23 HISTORY — DX: Essential (primary) hypertension: I10

## 2014-02-22 ENCOUNTER — Encounter (HOSPITAL_COMMUNITY): Payer: Self-pay | Admitting: *Deleted

## 2014-12-15 ENCOUNTER — Other Ambulatory Visit (HOSPITAL_COMMUNITY): Payer: Self-pay | Admitting: Nurse Practitioner

## 2014-12-15 DIAGNOSIS — Z1231 Encounter for screening mammogram for malignant neoplasm of breast: Secondary | ICD-10-CM

## 2014-12-21 ENCOUNTER — Ambulatory Visit (HOSPITAL_COMMUNITY)
Admission: RE | Admit: 2014-12-21 | Discharge: 2014-12-21 | Disposition: A | Discharge status: Home / Self Care | Payer: Self-pay | Source: Ambulatory Visit | Attending: Nurse Practitioner | Admitting: Nurse Practitioner

## 2014-12-21 DIAGNOSIS — Z1231 Encounter for screening mammogram for malignant neoplasm of breast: Secondary | ICD-10-CM

## 2016-03-05 ENCOUNTER — Other Ambulatory Visit: Payer: Self-pay | Admitting: Specialist

## 2016-03-05 DIAGNOSIS — Z1231 Encounter for screening mammogram for malignant neoplasm of breast: Secondary | ICD-10-CM

## 2016-03-08 ENCOUNTER — Ambulatory Visit
Admission: RE | Admit: 2016-03-08 | Discharge: 2016-03-08 | Disposition: A | Discharge status: Home / Self Care | Payer: No Typology Code available for payment source | Source: Ambulatory Visit | Attending: Specialist | Admitting: Specialist

## 2016-03-08 DIAGNOSIS — Z1231 Encounter for screening mammogram for malignant neoplasm of breast: Secondary | ICD-10-CM

## 2016-05-02 ENCOUNTER — Ambulatory Visit (HOSPITAL_COMMUNITY)
Admission: EM | Admit: 2016-05-02 | Discharge: 2016-05-02 | Discharge status: Against Medical Advice | Payer: No Typology Code available for payment source

## 2016-05-02 NOTE — ED Notes (Signed)
Patient registration advised that the patient has left.

## 2016-05-03 ENCOUNTER — Encounter (HOSPITAL_COMMUNITY): Payer: Self-pay | Admitting: Emergency Medicine

## 2016-05-03 ENCOUNTER — Ambulatory Visit (HOSPITAL_COMMUNITY)
Admission: EM | Admit: 2016-05-03 | Discharge: 2016-05-03 | Disposition: A | Discharge status: Home / Self Care | Payer: No Typology Code available for payment source | Attending: Emergency Medicine | Admitting: Emergency Medicine

## 2016-05-03 DIAGNOSIS — N390 Urinary tract infection, site not specified: Secondary | ICD-10-CM

## 2016-05-03 LAB — POCT URINALYSIS DIP (DEVICE)
Bilirubin Urine: NEGATIVE
GLUCOSE, UA: 500 mg/dL — AB
KETONES UR: NEGATIVE mg/dL
Nitrite: POSITIVE — AB
PROTEIN: 100 mg/dL — AB
Specific Gravity, Urine: 1.02 (ref 1.005–1.030)
UROBILINOGEN UA: 0.2 mg/dL (ref 0.0–1.0)
pH: 6 (ref 5.0–8.0)

## 2016-05-03 MED ORDER — CEPHALEXIN 500 MG PO CAPS: 500.0000 mg | ORAL_CAPSULE | Freq: Four times a day (QID) | ORAL | 0 refills | Status: DC

## 2016-05-03 NOTE — Discharge Instructions (Signed)
Take the Keflex as directed. Try not to miss any doses. Drink plenty of fluids especially water and stay well-hydrated. May also use AZO standard for urinary tract analgesia. If you develop fevers, worsening back pain, vomiting and unable to hold down liquids and medicines this could be a sign of worsening or condition call pyelonephritis which would require more aggressive therapy. In that situation he would want to go to the emergency department.

## 2016-05-03 NOTE — ED Provider Notes (Signed)
CSN: [id number]     Arrival date & time 05/03/16  1001 History   First MD Initiated Contact with Patient 05/03/16 1022     Chief Complaint  Patient presents with  . Urinary Tract Infection   (Consider location/radiation/quality/duration/timing/severity/associated sxs/prior Treatment) 41 year old female complaining of dysuria for 1 week. Associated with urinary frequency and urgency and small volume voids. She also has some vague lower back pain. Denies fever vomiting or chills. She does have occasional minor transient nausea. She has been taking cranberry pills and drinking water.      Past Medical History:  Diagnosis Date  . Allergy   . Asthma   . Diabetes mellitus   . Hypertension   . Kidney stone   . Kidney stones   . Morbid obesity ([place])    History reviewed. No pertinent surgical history. Family History  Problem Relation Age of Onset  . Heart disease Father   . Diabetes Father   . Kidney disease Father   . Hyperlipidemia Father   . Hypertension Father   . Diabetes Paternal Grandfather   . Heart disease Paternal Grandfather   . Hyperlipidemia Paternal Grandfather   . Hypertension Paternal Grandfather   . Kidney disease Paternal Grandfather    Social History  Substance Use Topics  . Smoking status: Never Smoker  . Smokeless tobacco: Never Used  . Alcohol use No   OB History    Gravida Para Term Preterm AB Living   3 1 1   2 1    SAB TAB Ectopic Multiple Live Births   2             Review of Systems  Constitutional: Negative.  Negative for fever.  HENT: Negative.   Respiratory: Negative.   Gastrointestinal: Positive for nausea. Negative for abdominal pain and vomiting.  Genitourinary: Positive for dysuria, frequency and urgency. Negative for menstrual problem, pelvic pain and vaginal bleeding.  All other systems reviewed and are negative.   Allergies  Corn-containing products; Honey; and Mango flavor  Home Medications   Prior to Admission medications    Medication Sig Start Date End Date Taking? Authorizing Provider  insulin glargine (LANTUS) 100 UNIT/ML injection Inject 50 Units into the skin at bedtime.   Yes Historical Provider, MD  ALBUTEROL IN Inhale into the lungs.      Historical Provider, MD  cephALEXin (KEFLEX) 500 MG capsule Take 1 capsule (500 mg total) by mouth 4 (four) times daily. 05/03/16   Janne Napoleon, NP  esomeprazole (NEXIUM) 40 MG capsule Take 40 mg by mouth daily.      Historical Provider, MD  Fluticasone-Salmeterol (ADVAIR DISKUS) 100-50 MCG/DOSE AEPB Inhale 1 puff into the lungs every 12 (twelve) hours.      Historical Provider, MD  losartan (COZAAR) 25 MG tablet Take 25 mg by mouth daily.      Historical Provider, MD  metFORMIN (GLUCOPHAGE) 500 MG tablet She should take 1000mg  every morning and 500 mg every evening. 04/12/11   Emily Filbert, MD  montelukast (SINGULAIR) 10 MG tablet Take 10 mg by mouth daily.      Historical Provider, MD  norgestimate-ethinyl estradiol (ORTHO-CYCLEN,SPRINTEC,PREVIFEM) 0.25-35 MG-MCG tablet Take 1 tablet by mouth daily. 02/22/11 02/22/12  Osborne Oman, MD   Meds Ordered and Administered this Visit  Medications - No data to display  BP 129/90 (BP Location: Right Arm) Comment (BP Location): large cuff  Pulse 95   Temp 98.8 F (37.1 C) (Oral)   Resp 20   LMP  04/19/2016   SpO2 96%  No data found.   Physical Exam  Constitutional: She is oriented to person, place, and time. She appears well-developed and well-nourished. No distress.  Neck: Neck supple.  Cardiovascular: Normal rate and regular rhythm.   Pulmonary/Chest: Effort normal.  Musculoskeletal: Normal range of motion. She exhibits no edema.  Neurological: She is alert and oriented to person, place, and time.  Skin: Skin is warm and dry.  Psychiatric: She has a normal mood and affect.  Nursing note and vitals reviewed.   Urgent Care Course   Clinical Course     Procedures (including critical care time)  Labs  Review Labs Reviewed  POCT URINALYSIS DIP (DEVICE) - Abnormal; Notable for the following:       Result Value   Glucose, UA 500 (*)    Hgb urine dipstick MODERATE (*)    Protein, ur 100 (*)    Nitrite POSITIVE (*)    Leukocytes, UA TRACE (*)    All other components within normal limits    Imaging Review No results found.   Visual Acuity Review  Right Eye Distance:   Left Eye Distance:   Bilateral Distance:    Right Eye Near:   Left Eye Near:    Bilateral Near:         MDM   1. Lower urinary tract infectious disease    Take the Keflex as directed. Try not to miss any doses. Drink plenty of fluids especially water and stay well-hydrated. May also use AZO standard for urinary tract analgesia. If you develop fevers, worsening back pain, vomiting and unable to hold down liquids and medicines this could be a sign of worsening or condition call pyelonephritis which would require more aggressive therapy. In that situation he would want to go to the emergency department. Meds ordered this encounter  Medications  . insulin glargine (LANTUS) 100 UNIT/ML injection    Sig: Inject 50 Units into the skin at bedtime.  . cephALEXin (KEFLEX) 500 MG capsule    Sig: Take 1 capsule (500 mg total) by mouth 4 (four) times daily.    Dispense:  28 capsule    Refill:  0    Order Specific Question:   Supervising Provider    Answer:   Melony Overly [id number]       Janne Napoleon, NP 05/03/16 1054

## 2016-05-03 NOTE — ED Triage Notes (Signed)
History of the same.  Symptoms for a week.  Urgency, but limited urine output.  Pain in back

## 2016-06-13 ENCOUNTER — Encounter: Payer: Self-pay | Admitting: Internal Medicine

## 2016-06-13 ENCOUNTER — Ambulatory Visit (INDEPENDENT_AMBULATORY_CARE_PROVIDER_SITE_OTHER): Payer: Self-pay | Admitting: Internal Medicine

## 2016-06-13 VITALS — BP 122/84 | HR 80 | Resp 20 | Ht 65.0 in | Wt 259.0 lb

## 2016-06-13 DIAGNOSIS — K8020 Calculus of gallbladder without cholecystitis without obstruction: Secondary | ICD-10-CM

## 2016-06-13 DIAGNOSIS — N200 Calculus of kidney: Secondary | ICD-10-CM

## 2016-06-13 DIAGNOSIS — E119 Type 2 diabetes mellitus without complications: Secondary | ICD-10-CM

## 2016-06-13 DIAGNOSIS — T7840XA Allergy, unspecified, initial encounter: Secondary | ICD-10-CM | POA: Insufficient documentation

## 2016-06-13 DIAGNOSIS — G43001 Migraine without aura, not intractable, with status migrainosus: Secondary | ICD-10-CM

## 2016-06-13 DIAGNOSIS — T7840XD Allergy, unspecified, subsequent encounter: Secondary | ICD-10-CM

## 2016-06-13 DIAGNOSIS — J4530 Mild persistent asthma, uncomplicated: Secondary | ICD-10-CM

## 2016-06-13 DIAGNOSIS — E282 Polycystic ovarian syndrome: Secondary | ICD-10-CM

## 2016-06-13 DIAGNOSIS — J45909 Unspecified asthma, uncomplicated: Secondary | ICD-10-CM | POA: Insufficient documentation

## 2016-06-13 DIAGNOSIS — I10 Essential (primary) hypertension: Secondary | ICD-10-CM

## 2016-06-13 DIAGNOSIS — Z794 Long term (current) use of insulin: Secondary | ICD-10-CM

## 2016-06-13 MED ORDER — MONTELUKAST SODIUM 10 MG PO TABS: 10.0000 mg | ORAL_TABLET | Freq: Every day | ORAL | 11 refills | Status: DC

## 2016-06-13 MED ORDER — DEXLANSOPRAZOLE 30 MG PO CPDR: 30.0000 mg | DELAYED_RELEASE_CAPSULE | Freq: Every day | ORAL | 11 refills | Status: DC

## 2016-06-13 MED ORDER — ALBUTEROL SULFATE (2.5 MG/3ML) 0.083% IN NEBU: 2.5000 mg | INHALATION_SOLUTION | Freq: Four times a day (QID) | RESPIRATORY_TRACT | 0 refills | Status: DC | PRN

## 2016-06-13 NOTE — Patient Instructions (Addendum)
Can google "advance directives, [place]"  And bring up form from Secretary of [place]. Print and fill out Or can go to "5 wishes"  Which is also in Spanish and fill out--this costs $5--perhaps easier to use. Designate a Forensic scientist to speak for you if you are unable to speak for yourself when ill or injured  Drink a glass of water before every meal Drink 6-8 glasses of water daily Eat three meals daily Eat a protein and healthy fat with every meal (eggs,fish, chicken, [place] and limit red meats) Eat 5 servings of vegetables daily, mix the colors Eat 2 servings of fruit daily with, if skin is edible Use smaller plates Put food/utensils down as you chew and swallow each bite Eat at a table with friends/family at least once daily, no TV Do not eat in front of the TV  Take Ranitidine 150 mg twice daily until back on Dexilant, or may take 300 mg at one time

## 2016-06-13 NOTE — Progress Notes (Signed)
Subjective:    Patient ID: Lori Shea, female    DOB: Jul 06, 1975, 41 y.o.   MRN: [id number]  HPI   Here to establish  1.  PCOS:  Diagnosed in 2010.  Has been overweight most of life. Was thin when very young.   Gained a lot of weight with prednisone bursts with her asthma as a child.   Evaluation at [facility] clinic.  Initially treated with Metformin and Sprintec.  Ultimately diagnosed with DM about 2 years later.    2.  DM Type 2:  A1C was high on just Metformin.  Januvia added, but did not make a difference per patient.  Treated with Victoza, but had severe GI intolerance.  Ultimately, about 3-4 years ago, placed on Lantus and gradually increased to 50 units at bedtime. Subsequently, placed on Invokamet 1 year ago--combination of Canagliflozin and Metformin.  Feels her sugars are generally around 155 first thing in the morning fasting.  Last A1C was 7.4% about 03/2016.   Checks feet regularly.   Last eye check 05/2015 with no diabetic changes at [facility]. Did not get influenza this year Received pneumococcal vaccine in 1999 after suffering from a severe pneumonia. Weight was up to 305 lbs at one point.  Has lost about 45 lbs over a period of 5 years or more.   Trying to go to gym--but has not recently. Trying to eat better.  2.  Essential Hypertension:  Diagnosed 3 years ago.  No history of protein in urine per patient, except when pregnant.  3.  Asthma:  Using rescue inhaler about twice weekly.  Has not needed more for about 6 months until about 1 week ago--three times last week.   No mold or moisture issues, no roaches.  Does have a cat that sneaks in when it's cold outside.  Does have carpeting in some rooms.  Vacuums several times weekly. Does have hypollergenic covers to pillows and mattress, does wash bedclothes weekly.  4.  PCOS:  Periods regular if does not run out of [place].  5.  GERD:  Has been out of Dexilant for a bit.  Taking Ranitidine 150 mg once  daily, but not controlling as well.  Not clear what the ICP problem is with her Dexilant--needs to check with MAP.  6.  Meloxicam:  Taking for generalized joint pain.  She did not realize could make her heartburn/epigastric pain worse.  7.  Anxiety:  Finding noises bother more significantly.  Feels jittery.  Can have somewhat of a panic attack.  Would consider counseling.  8.  Left ear pain and neck pain with some difficulty swallowing for several days.  No fever.  Current Meds  Medication Sig  . albuterol (PROVENTIL HFA;VENTOLIN HFA) 108 (90 Base) MCG/ACT inhaler Inhale 2 puffs into the lungs every 4 (four) hours as needed for wheezing or shortness of breath.  Marland Kitchen albuterol (PROVENTIL) (2.5 MG/3ML) 0.083% nebulizer solution Take 2.5 mg by nebulization every 6 (six) hours as needed for wheezing or shortness of breath.  Marland Kitchen amitriptyline (ELAVIL) 25 MG tablet Take 25 mg by mouth at bedtime.  . Canagliflozin-Metformin HCl (INVOKAMET) 50-500 MG TABS Take 1 tablet by mouth 2 (two) times daily after a meal.  . Dexlansoprazole (DEXILANT) 30 MG capsule Take 30 mg by mouth daily. Take on empty stomach  . Fluticasone-Salmeterol (ADVAIR DISKUS) 100-50 MCG/DOSE AEPB Inhale 1 puff into the lungs every 12 (twelve) hours.    . insulin glargine (LANTUS) 100 UNIT/ML injection Inject  50 Units into the skin at bedtime.  . meloxicam (MOBIC) 15 MG tablet Take 15 mg by mouth daily.  . montelukast (SINGULAIR) 10 MG tablet Take 10 mg by mouth daily.    . Multiple Vitamins-Minerals (MULTIVITAMIN ADULT PO) Take 1 tablet by mouth daily.  Marland Kitchen olmesartan-hydrochlorothiazide (BENICAR HCT) 40-25 MG tablet Take 1 tablet by mouth daily.  . [DISCONTINUED] Canagliflozin-Metformin HCl (INVOKAMET) 50-500 MG TABS Take 1 tablet by mouth.   Allergies  Allergen Reactions  . Corn-Containing Products   . Honey Shortness Of Breath  . 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 Victoza [Liraglutide] Nausea And Vomiting         Review of Systems     Objective:   Physical Exam   NAD, obese HEENT:  PERRL, EOMI, TMs pearly gray, throat without injection Left TM mildly retracted and with good light reflex, no erythema Neck:  Supple, Mildly tender let anterior cervical node, 1 cm and moveable. Chest:  CTA CV:  RRR with normal S1 and S2, No s3, S4 or murmur, radial pulses normal and equal Abd: S, NT, No HSM or mass, + BS LE:  No edema      Assessment & Plan:  1.  DM/PCOS:  No change to current medication for now.  Send for old records. Optometry referral  2.  Left ear pain:  Likely eustachian tube dysfunction.  Discussed to give time to resolve.  If worsens to call.  3.  Anxiety:  Has phone number to contact MSW intern, [facility].  Encouraged her to start with counseling and will discuss medication thereafter, particularly with all the meds she is currently taking.  4.  GERD:  Dexilant--call MAP to see if they can figure out what happened with her ICP.  To take Ranitidine 150 mg twice daily until has Dexilant again.  5.  Allergies/Asthma:  Seems to be fairly well controlled until last week.  May have mild URI triggering.  To call if worsens. Refilled singulair and albuterol for nebulization  FAsting labs in 1-2 weeks. Return in 3 months for CPE without pap.  Has had mammogram.

## 2016-06-18 ENCOUNTER — Telehealth: Payer: Self-pay | Admitting: Internal Medicine

## 2016-06-18 NOTE — Telephone Encounter (Signed)
Patient would like dental referral.  Patient forgot to mention to doctor when she came in on 06/13/16 that she was having some dental concerns.  Had teeth filled by dental clinic few years ago and having some pain, also would like to have teeth cleaned.  Please let patient know if doctor can process a referral for her without her coming back to office.

## 2016-06-21 NOTE — Telephone Encounter (Signed)
Spoke with patient. Informed they are only accepting emergency dental referrals at this time.

## 2016-06-22 ENCOUNTER — Other Ambulatory Visit: Payer: Self-pay

## 2016-06-22 MED ORDER — OLMESARTAN MEDOXOMIL-HCTZ 40-25 MG PO TABS: 1.0000 | ORAL_TABLET | Freq: Every day | ORAL | 11 refills | Status: DC

## 2016-06-27 ENCOUNTER — Other Ambulatory Visit: Payer: Self-pay

## 2016-06-27 MED ORDER — SITAGLIPTIN PHOSPHATE 100 MG PO TABS: 100.0000 mg | ORAL_TABLET | Freq: Every day | ORAL | 3 refills | Status: DC

## 2016-07-03 ENCOUNTER — Other Ambulatory Visit: Payer: Self-pay

## 2016-07-19 ENCOUNTER — Other Ambulatory Visit (INDEPENDENT_AMBULATORY_CARE_PROVIDER_SITE_OTHER): Payer: Self-pay

## 2016-07-19 DIAGNOSIS — Z794 Long term (current) use of insulin: Secondary | ICD-10-CM

## 2016-07-19 DIAGNOSIS — Z13220 Encounter for screening for lipoid disorders: Secondary | ICD-10-CM

## 2016-07-19 DIAGNOSIS — Z79899 Other long term (current) drug therapy: Secondary | ICD-10-CM

## 2016-07-19 DIAGNOSIS — E119 Type 2 diabetes mellitus without complications: Secondary | ICD-10-CM

## 2016-07-20 LAB — CBC WITH DIFFERENTIAL/PLATELET
BASOS ABS: 0.1 10*3/uL (ref 0.0–0.2)
BASOS: 1 %
EOS (ABSOLUTE): 0.3 10*3/uL (ref 0.0–0.4)
Eos: 3 %
HEMOGLOBIN: 13.2 g/dL (ref 11.1–15.9)
Hematocrit: 40.5 % (ref 34.0–46.6)
IMMATURE GRANS (ABS): 0 10*3/uL (ref 0.0–0.1)
Immature Granulocytes: 0 %
LYMPHS ABS: 3 10*3/uL (ref 0.7–3.1)
Lymphs: 32 %
MCH: 27.5 pg (ref 26.6–33.0)
MCHC: 32.6 g/dL (ref 31.5–35.7)
MCV: 84 fL (ref 79–97)
Monocytes Absolute: 0.6 10*3/uL (ref 0.1–0.9)
Monocytes: 6 %
NEUTROS ABS: 5.5 10*3/uL (ref 1.4–7.0)
Neutrophils: 58 %
PLATELETS: 315 10*3/uL (ref 150–379)
RBC: 4.8 x10E6/uL (ref 3.77–5.28)
RDW: 15.4 % (ref 12.3–15.4)
WBC: 9.4 10*3/uL (ref 3.4–10.8)

## 2016-07-20 LAB — COMPREHENSIVE METABOLIC PANEL
ALBUMIN: 3.7 g/dL (ref 3.5–5.5)
ALT: 54 IU/L — ABNORMAL HIGH (ref 0–32)
AST: 74 IU/L — ABNORMAL HIGH (ref 0–40)
Albumin/Globulin Ratio: 1.4 (ref 1.2–2.2)
Alkaline Phosphatase: 79 IU/L (ref 39–117)
BILIRUBIN TOTAL: 0.2 mg/dL (ref 0.0–1.2)
BUN / CREAT RATIO: 24 — AB (ref 9–23)
BUN: 16 mg/dL (ref 6–24)
CALCIUM: 9.2 mg/dL (ref 8.7–10.2)
CHLORIDE: 99 mmol/L (ref 96–106)
CO2: 25 mmol/L (ref 18–29)
Creatinine, Ser: 0.66 mg/dL (ref 0.57–1.00)
GFR, EST AFRICAN AMERICAN: 126 mL/min/{1.73_m2} (ref >59)
GFR, EST NON AFRICAN AMERICAN: 109 mL/min/{1.73_m2} (ref >59)
GLOBULIN, TOTAL: 2.7 g/dL (ref 1.5–4.5)
Glucose: 176 mg/dL — ABNORMAL HIGH (ref 65–99)
POTASSIUM: 4.4 mmol/L (ref 3.5–5.2)
Sodium: 140 mmol/L (ref 134–144)
TOTAL PROTEIN: 6.4 g/dL (ref 6.0–8.5)

## 2016-07-20 LAB — LIPID PANEL W/O CHOL/HDL RATIO
CHOLESTEROL TOTAL: 180 mg/dL (ref 100–199)
HDL: 60 mg/dL (ref >39)
LDL Calculated: 98 mg/dL (ref 0–99)
TRIGLYCERIDES: 109 mg/dL (ref 0–149)
VLDL Cholesterol Cal: 22 mg/dL (ref 5–40)

## 2016-07-20 LAB — MICROALBUMIN / CREATININE URINE RATIO
Creatinine, Urine: 121.3 mg/dL
MICROALB/CREAT RATIO: 11.5 mg/g{creat} (ref 0.0–30.0)
Microalbumin, Urine: 14 ug/mL

## 2016-07-20 LAB — HGB A1C W/O EAG: HEMOGLOBIN A1C: 8.8 % — AB (ref 4.8–5.6)

## 2016-07-30 ENCOUNTER — Other Ambulatory Visit: Payer: Self-pay

## 2016-07-30 MED ORDER — INSULIN GLARGINE 100 UNIT/ML ~~LOC~~ SOLN: 50.0000 [IU] | Freq: Every day | SUBCUTANEOUS | 5 refills | Status: DC

## 2016-09-13 ENCOUNTER — Ambulatory Visit (INDEPENDENT_AMBULATORY_CARE_PROVIDER_SITE_OTHER): Payer: Self-pay | Admitting: Internal Medicine

## 2016-09-13 ENCOUNTER — Encounter: Payer: Self-pay | Admitting: Internal Medicine

## 2016-09-13 ENCOUNTER — Ambulatory Visit: Payer: Self-pay | Admitting: Internal Medicine

## 2016-09-13 VITALS — BP 130/78 | HR 88 | Resp 18 | Ht 66.0 in | Wt 265.0 lb

## 2016-09-13 DIAGNOSIS — R748 Abnormal levels of other serum enzymes: Secondary | ICD-10-CM

## 2016-09-13 DIAGNOSIS — G43001 Migraine without aura, not intractable, with status migrainosus: Secondary | ICD-10-CM

## 2016-09-13 DIAGNOSIS — Z794 Long term (current) use of insulin: Secondary | ICD-10-CM

## 2016-09-13 DIAGNOSIS — E119 Type 2 diabetes mellitus without complications: Secondary | ICD-10-CM

## 2016-09-13 DIAGNOSIS — J4530 Mild persistent asthma, uncomplicated: Secondary | ICD-10-CM

## 2016-09-13 DIAGNOSIS — Z23 Encounter for immunization: Secondary | ICD-10-CM

## 2016-09-13 HISTORY — DX: Abnormal levels of other serum enzymes: R74.8

## 2016-09-13 MED ORDER — TART CHERRY ADVANCED PO CAPS: ORAL_CAPSULE | ORAL | Status: DC

## 2016-09-13 MED ORDER — RIZATRIPTAN BENZOATE 10 MG PO TBDP: 10.0000 mg | ORAL_TABLET | ORAL | 6 refills | Status: DC | PRN

## 2016-09-13 MED ORDER — METFORMIN HCL ER 500 MG PO TB24: ORAL_TABLET | ORAL | 11 refills | Status: DC

## 2016-09-13 NOTE — Patient Instructions (Addendum)
Keeping track of sugars:  In a spiral bound notebook      Month Name (January) Day of month   B   L   D  BT 1   Time /sugar level    7:45 pm  150 2 3 4   Drink a glass of water before every meal Drink 6-8 glasses of water daily Eat three meals daily Eat a protein and healthy fat with every meal (eggs,fish, chicken, [place] and limit red meats) Eat 5 servings of vegetables daily, mix the colors Eat 2 servings of fruit daily with skin, if skin is edible Use smaller plates Put food/utensils down as you chew and swallow each bite Eat at a table with friends/family at least once daily, no TV Do not eat in front of the TV  [facility] [address]  [place], [place] [address] [place] please with 40% off for [employer] patient  Get back to the gym and walking.

## 2016-09-13 NOTE — Progress Notes (Signed)
Subjective:    Patient ID: Lori Shea, female    DOB: [birth year], 41 y.o.   MRN: [id number]  HPI  Gets her pap at [facility] and does not need a CPE.  1.  Migraines:  Diagnosed when she was in early 74s.  Headache starts in neck and goes over her head.  Headache becomes pounding.   + photophobia,  + phonophobia.  + nausea with vomiting.  Takes Amitriptyline at bedtime to prevent migraines.  Headaches really occur most often the days before she starts her period. Wondering if she can get something to treat an acute migraine when she has one. Still with 7-8 migraines per month. Has been to a chiropractor for the neck:  Was given exercises, but never does then.   Has tried Maxalt in past.  Was a small dose and did not help.  No allergy to med, however. Has tried Flexeril and initially helped, but now no help.   Taking a "natural"  Muscle relaxant from chiropractor.  0  2.  DM:  A1C was 8.8% last check in April.  Would like to get off Invokamet due to amputation risk.   Did not tolerate plain Metformin at higher doses:  Loose stools. Has not been doing her regular physical activity.  Every year has problems with getting busy with work at beginning of year.  See previous visit with list of medications she did not do well with in treating her asthma  3.  Elevated Liver Enzymes:  Has occurred in past.  Hepatitis A, B, C negative when this occurred.  Has gained weight and not physically active.  Does not drink alcohol.   4.  Asthma:  Not requiring rescue inhaler even once weekly in most weeks at this time.    Current Meds  Medication Sig  . albuterol (PROVENTIL HFA;VENTOLIN HFA) 108 (90 Base) MCG/ACT inhaler Inhale 2 puffs into the lungs every 4 (four) hours as needed for wheezing or shortness of breath.  Marland Kitchen albuterol (PROVENTIL) (2.5 MG/3ML) 0.083% nebulizer solution Take 3 mLs (2.5 mg total) by nebulization every 6 (six) hours as needed for wheezing or shortness of breath.  Marland Kitchen amitriptyline  (ELAVIL) 25 MG tablet Take 25 mg by mouth at bedtime.  . Canagliflozin-Metformin HCl (INVOKAMET) 50-500 MG TABS Take 1 tablet by mouth 2 (two) times daily after a meal.  . Dexlansoprazole (DEXILANT) 30 MG capsule Take 1 capsule (30 mg total) by mouth daily. Take on empty stomach  . Fluticasone-Salmeterol (ADVAIR DISKUS) 100-50 MCG/DOSE AEPB Inhale 1 puff into the lungs every 12 (twelve) hours.    . insulin glargine (LANTUS) 100 UNIT/ML injection Inject 0.5 mLs (50 Units total) into the skin at bedtime.  . meloxicam (MOBIC) 15 MG tablet Take 15 mg by mouth daily.  . montelukast (SINGULAIR) 10 MG tablet Take 1 tablet (10 mg total) by mouth daily.  . Multiple Vitamins-Minerals (MULTIVITAMIN ADULT PO) Take 1 tablet by mouth daily.  Marland Kitchen olmesartan-hydrochlorothiazide (BENICAR HCT) 40-25 MG tablet Take 1 tablet by mouth daily.   Allergies  Allergen Reactions  . Corn-Containing Products   . Honey Shortness Of Breath  . 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 Victoza [Liraglutide] Nausea And Vomiting    Review of Systems     Objective:   Physical Exam  NAD Lungs:  CTA CV:  RRR without murmur or rub, radial and DP pulses normal and equal Abd:  S, NT, + BS LE:  No  edema        Assessment & Plan:  1.  Tension and Migraine Headaches:  Encouraged to get started on exercises for her upper back and neck her chiropractor gave her. Can try [place] for inflammation and see if helpful Call into [facility], though did send in [place] higher dose prescription.  Not clear if other tryptans available there. Again, no allergic reaction to Maxalt in past.  2.  DM:  Stopping the Invokamet due to risk of limb loss.  Change to Metformin ER 500 mg twice daily.   If tolerates, will see if can increase to 1000 mg twice daily. Continue Lantus for now. To get started back on physical activity and eating better Check sugars twice daily and document A1C in 2 months.  3.  Elevated liver enzymes:  Suspect  fatty liver.  Recheck CMP in 2 months  4.  Morbid obesity:  As in DM.    5.  Asthma:  Quit stable.  6.  HM:  Tdap today.

## 2016-10-23 ENCOUNTER — Ambulatory Visit: Payer: Self-pay | Admitting: Internal Medicine

## 2016-10-31 ENCOUNTER — Other Ambulatory Visit: Payer: Self-pay

## 2016-10-31 MED ORDER — FLUTICASONE-SALMETEROL 100-50 MCG/DOSE IN AEPB: 1.0000 | INHALATION_SPRAY | Freq: Two times a day (BID) | RESPIRATORY_TRACT | 2 refills | Status: DC

## 2016-11-13 ENCOUNTER — Other Ambulatory Visit: Payer: Self-pay

## 2016-11-22 ENCOUNTER — Ambulatory Visit: Payer: Self-pay | Admitting: Internal Medicine

## 2016-12-20 ENCOUNTER — Emergency Department (HOSPITAL_COMMUNITY): Payer: Self-pay

## 2016-12-20 ENCOUNTER — Encounter (HOSPITAL_COMMUNITY): Payer: Self-pay

## 2016-12-20 ENCOUNTER — Emergency Department (HOSPITAL_COMMUNITY)
Admission: EM | Admit: 2016-12-20 | Discharge: 2016-12-20 | Disposition: A | Discharge status: Home / Self Care | Payer: Self-pay | Attending: Physician Assistant | Admitting: Physician Assistant

## 2016-12-20 DIAGNOSIS — Z794 Long term (current) use of insulin: Secondary | ICD-10-CM | POA: Insufficient documentation

## 2016-12-20 DIAGNOSIS — Y999 Unspecified external cause status: Secondary | ICD-10-CM | POA: Insufficient documentation

## 2016-12-20 DIAGNOSIS — S82891A Other fracture of right lower leg, initial encounter for closed fracture: Secondary | ICD-10-CM | POA: Insufficient documentation

## 2016-12-20 DIAGNOSIS — M25579 Pain in unspecified ankle and joints of unspecified foot: Secondary | ICD-10-CM

## 2016-12-20 DIAGNOSIS — Y939 Activity, unspecified: Secondary | ICD-10-CM | POA: Insufficient documentation

## 2016-12-20 DIAGNOSIS — Y929 Unspecified place or not applicable: Secondary | ICD-10-CM | POA: Insufficient documentation

## 2016-12-20 DIAGNOSIS — E119 Type 2 diabetes mellitus without complications: Secondary | ICD-10-CM | POA: Insufficient documentation

## 2016-12-20 DIAGNOSIS — I10 Essential (primary) hypertension: Secondary | ICD-10-CM | POA: Insufficient documentation

## 2016-12-20 DIAGNOSIS — Z79899 Other long term (current) drug therapy: Secondary | ICD-10-CM | POA: Insufficient documentation

## 2016-12-20 DIAGNOSIS — J45909 Unspecified asthma, uncomplicated: Secondary | ICD-10-CM | POA: Insufficient documentation

## 2016-12-20 DIAGNOSIS — W101XXA Fall (on)(from) sidewalk curb, initial encounter: Secondary | ICD-10-CM | POA: Insufficient documentation

## 2016-12-20 MED ORDER — HYDROCODONE-ACETAMINOPHEN 5-325 MG PO TABS: 1.0000 | ORAL_TABLET | Freq: Four times a day (QID) | ORAL | 0 refills | Status: AC | PRN

## 2016-12-20 MED ORDER — IBUPROFEN 400 MG PO TABS
600.0000 mg | ORAL_TABLET | Freq: Once | ORAL | Status: AC
  Administered 2016-12-20: 17:00:00 600 mg via ORAL
  Filled 2016-12-20: qty 1

## 2016-12-20 MED ORDER — IBUPROFEN 600 MG PO TABS: 600.0000 mg | ORAL_TABLET | Freq: Four times a day (QID) | ORAL | 0 refills | Status: DC | PRN

## 2016-12-20 NOTE — ED Notes (Signed)
Ortho notified of need for cam walker.

## 2016-12-20 NOTE — Progress Notes (Signed)
Orthopedic Tech Progress Note Patient Details:  Lori Shea Jan 27, 1976 [id number]  Ortho Devices Type of Ortho Device: CAM walker Ortho Device/Splint Interventions: Application   [name] 12/20/2016, 6:02 PM

## 2016-12-20 NOTE — ED Notes (Signed)
Called pt x2 no response.

## 2016-12-20 NOTE — ED Notes (Signed)
Patient verbalized understanding of discharge instructions and denies any further needs or questions at this time. VS stable. Patient ambulatory with steady gait, using cam walker. Escorted patient to ED entrance in wheelchair.

## 2016-12-20 NOTE — ED Triage Notes (Signed)
Pt states she fell and heard something in her right foot pop. Pt reports pain is bearable until she puts weight on it. Some swelling noted. Pt denies LOC.

## 2016-12-20 NOTE — Discharge Instructions (Signed)
Please see the information and instructions below regarding your visit.  Your diagnoses today include:  1. Closed fracture of malleolus of right ankle, initial encounter   2. Ankle pain     You have a fracture in the tip of the bone that runs from your knee to your ankle.  Tests performed today include: X-ray of foot, ankle, leg bones.  Medications prescribed:    1. You have been prescribed Norco for pain. This is an opioid pain medication. You may take this medication every 4-6 hours as needed for pain. Only take this medication if you need it for breakthrough pain. You may combine this medicine with ibuprofen, a non-steroidal anti-inflammatory drug (NSAID) every 6 hours, so you are getting something for pain relief every 3 hours.  Do not combine this medication with Tylenol, as it may increase the risk of liver problems.  Do not combine this medication with alcohol.  Please be advised to avoid driving or operating heavy machinery while taking this medication, as it may make you drowsy or impair judgment.    2. You are prescribed Mobic, a non-steroidal anti-inflammatory agent (NSAID) for pain. You may take this once daily as needed for pain.  Take any prescribed medications only as prescribed, and any over the counter medications only as directed on the packaging.  Home care instructions:  Please follow any educational materials contained in this packet.  Follow any educational materials contained in this packet Follow R.I.C.E. Protocol: R - rest your injury  I  - use ice on injury without applying directly to skin C - compress injury with CAM walking boot. E - elevate the injury as much as possible.  Follow-up instructions:  Please follow up with Orthopedics as soon as possible.  Return instructions:  Please return to the Emergency Department if you experience worsening symptoms.  Please return for increased swelling in the ankle, increased pain, redness, total loss of  sensation in your foot, foot turning pale, foot turning cold. Please return if you have any other emergent concerns.   Your vital signs today were: BP 125/83 (BP Location: Right Arm)    Pulse 84    Temp 98.6 F (37 C) (Oral)    Resp 16    LMP 12/11/2016 (Exact Date)    SpO2 99%  If your blood pressure (BP) was elevated on multiple readings during this visit above 130 for the top number or above 80 for the bottom number, please have this repeated by your primary care provider within one month. --------------  Thank you for allowing [place] to participate in your care today.

## 2016-12-20 NOTE — ED Provider Notes (Signed)
[facility] DEPT Provider Note   CSN: [id number] Arrival date & time: 12/20/16  1323     History   Chief Complaint Chief Complaint  Patient presents with  . Fall  . Foot Injury    HPI FABLE HUISMAN is a 41 y.o. female.  HPI   Patient is 41 year old female with a past history diabetes and hypertension presenting to the ED following a fall off a curb earlier this afternoon prior to arrival. Patient reports that she experienced a mechanical fall  with no syncope, presyncope, lightheadedness, or dizziness. Patient reports twisting her ankle and catching herself. Patient reports the pain is Focused in the right lateral ankle. Patient is nonweightbearing since injury. Patient reports pain is worse with inversion and is 8 out of 10. Patient reports some paresthesias in loss of sensation in the toes of the right foot. Patient reported some nausea with no vomiting.   Past Medical History:  Diagnosis Date  . Allergy    Foods:  mango, honey, corn.  Allergic to "everything tested"  15 years ago in [place]  . Asthma age 57 yo   Triggers:  cold weather, exercise, allergens, anxiety, URIs  . Cholelithiasis age 72 yo   Asymptomatic  . Diabetes mellitus 2012   Was diagnosed 2 years earlier with PCOS  . Elevated liver enzymes 09/13/2016  . Hypertension 2015  . Kidney stone   . Kidney stones   . Migraines 2009  . Morbid obesity ([place])   . PCOS (polycystic ovarian syndrome) 2010    Patient Active Problem List   Diagnosis Date Noted  . Morbid obesity ([place]) 09/13/2016  . Elevated liver enzymes 09/13/2016  . Allergy   . Asthma   . Kidney stones   . Cholelithiasis   . Hypertension 04/23/2013  . Irregular menstrual bleeding 02/22/2011  . Diabetes ([place]) 04/23/2010  . PCOS (polycystic ovarian syndrome) 04/23/2008  . Migraines 04/24/2007  . ASTHMA NOS W/ACUTE EXACERBATION 09/27/2006  . VAGINAL DELIVERY, HX OF 09/27/2006    History reviewed. No pertinent surgical history.  OB  History    Gravida Para Term Preterm AB Living   3 1 1   2 1    SAB TAB Ectopic Multiple Live Births   2               Home Medications    Prior to Admission medications   Medication Sig Start Date End Date Taking? Authorizing Provider  albuterol (PROVENTIL HFA;VENTOLIN HFA) 108 (90 Base) MCG/ACT inhaler Inhale 2 puffs into the lungs every 4 (four) hours as needed for wheezing or shortness of breath.    [provider]  albuterol (PROVENTIL) (2.5 MG/3ML) 0.083% nebulizer solution Take 3 mLs (2.5 mg total) by nebulization every 6 (six) hours as needed for wheezing or shortness of breath. 06/13/16   Mack Hook, MD  amitriptyline (ELAVIL) 25 MG tablet Take 25 mg by mouth at bedtime.    [provider]  Dexlansoprazole (DEXILANT) 30 MG capsule Take 1 capsule (30 mg total) by mouth daily. Take on empty stomach 06/13/16   Mack Hook, MD  Fluticasone-Salmeterol (ADVAIR DISKUS) 100-50 MCG/DOSE AEPB Inhale 1 puff into the lungs every 12 (twelve) hours. 10/31/16   Mack Hook, MD  insulin glargine (LANTUS) 100 UNIT/ML injection Inject 0.5 mLs (50 Units total) into the skin at bedtime. 07/30/16   Mack Hook, MD  meloxicam (MOBIC) 15 MG tablet Take 15 mg by mouth daily.    [provider]  metFORMIN (GLUCOPHAGE-XR) 500 MG  24 hr tablet 1 tab by mouth twice daily with meals 09/13/16   Mack Hook, MD  Misc Natural Products [facility] ADVANCED) CAPS 1 cap by mouth twice daily 09/13/16   Mack Hook, MD  montelukast (SINGULAIR) 10 MG tablet Take 1 tablet (10 mg total) by mouth daily. 06/13/16   Mack Hook, MD  Multiple Vitamins-Minerals (MULTIVITAMIN ADULT PO) Take 1 tablet by mouth daily.    [provider]  norgestimate-ethinyl estradiol (ORTHO-CYCLEN,SPRINTEC,PREVIFEM) 0.25-35 MG-MCG tablet Take 1 tablet by mouth daily. 02/22/11 02/22/12  Anyanwu, Sallyanne Havers, MD  olmesartan-hydrochlorothiazide (BENICAR HCT) 40-25 MG  tablet Take 1 tablet by mouth daily. 06/22/16   Mack Hook, MD  rizatriptan (MAXALT-MLT) 10 MG disintegrating tablet Take 1 tablet (10 mg total) by mouth as needed for migraine. May repeat in 2 hours if needed.  Max 30 mg/24 h 09/13/16   Mack Hook, MD    Family History Family History  Problem Relation Age of Onset  . Diabetes Father   . Hyperlipidemia Father   . Hypertension Father   . Heart disease Father   . Kidney disease Father        Developed after 2nd CABG  . Diabetes Paternal Grandfather   . Heart disease Paternal Grandfather   . Hyperlipidemia Paternal Grandfather   . Hypertension Paternal Grandfather   . Kidney disease Paternal Grandfather     Social History Social History  Substance Use Topics  . Smoking status: Never Smoker  . Smokeless tobacco: Never Used  . Alcohol use No     Allergies   Corn-containing products; Honey; Mango flavor; and Victoza [liraglutide]   Review of Systems Review of Systems  Musculoskeletal: Positive for arthralgias and joint swelling. Negative for gait problem.  Skin: Negative for color change.  Neurological: Positive for numbness. Negative for dizziness, syncope, weakness and light-headedness.     Physical Exam Updated Vital Signs BP 125/83 (BP Location: Right Arm)   Pulse 84   Temp 98.6 F (37 C) (Oral)   Resp 16   LMP 12/11/2016 (Exact Date)   SpO2 99%   Physical Exam  Constitutional: She appears well-developed and well-nourished. No distress.  Sitting comfortably in bed.  HENT:  Head: Normocephalic and atraumatic.  Eyes: Conjunctivae are normal. Right eye exhibits no discharge. Left eye exhibits no discharge.  EOMs normal to gross examination.  Neck: Normal range of motion.  Cardiovascular: Normal rate and regular rhythm.   Intact, 2+ radial pulse.  Pulmonary/Chest:  Normal respiratory effort. Patient converses comfortably. No audible wheeze or stridor.  Abdominal: She exhibits no distension.    Musculoskeletal:  Swelling of the right distal fibular region with no ecchymosis or erythema. Point tenderness to the right lateral malleolus. No point tenderness of the medial malleolus. No bony tenderness at the base of the fifth metatarsal or navicular. Mild tenderness to palpation of the proximal head of the fibula. No deformity or swelling at head of fibula. Ecchymosis of the left third toe. Mild tenderness to palpation but no deformity or swelling of this toe. No open skin or abrasions over this lesion.  Neurological: She is alert.  Cranial nerves intact to gross observation. Patient moves extremities without difficulty with exception of right ankle.  Skin: Skin is warm and dry. She is not diaphoretic.  There is a hemostatic abrasion over the left patella.  Psychiatric: She has a normal mood and affect. Her behavior is normal. Judgment and thought content normal.  Nursing note and vitals reviewed.  ED Treatments / Results  Labs (all labs ordered are listed, but only abnormal results are displayed) Labs Reviewed - No data to display  EKG  EKG Interpretation None       Radiology Dg Tibia/fibula Right  Result Date: 12/20/2016 CLINICAL DATA:  [place] and injured right leg. EXAM: RIGHT TIBIA AND FIBULA - 2 VIEW COMPARISON:  None. FINDINGS: The knee and ankle joints are maintained. There is a small avulsion fracture involving the distal tip of the lateral malleolus. No tibia or fibular shaft fractures. IMPRESSION: Avulsion fracture involving the distal tip of the lateral malleolus. No fractures of the tibial or fibular shafts. Electronically Signed   By: Marijo Sanes M.D.   On: 12/20/2016 16:49   Dg Ankle Complete Right  Result Date: 12/20/2016 CLINICAL DATA:  [place] and injured right ankle. EXAM: RIGHT ANKLE - COMPLETE 3+ VIEW COMPARISON:  None. FINDINGS: Avulsion fracture involving the distal tip of the lateral malleolus. The ankle mortise is maintained. No definite tibial  fractures. No osteochondral lesion. Calcaneal heel spurs are noted. IMPRESSION: Avulsion fracture involving the distal tip of the lateral malleolus. Electronically Signed   By: Marijo Sanes M.D.   On: 12/20/2016 16:50   Dg Foot Complete Right  Result Date: 12/20/2016 CLINICAL DATA:  Fall.  Difficulty weight-bearing EXAM: RIGHT FOOT COMPLETE - 3+ VIEW COMPARISON:  None. FINDINGS: There is no evidence of fracture or dislocation. Plantar and posterior heel spurs. There is no evidence of arthropathy or other focal bone abnormality. Soft tissues are unremarkable. IMPRESSION: 1. No acute findings. . Electronically Signed   By: Kerby Moors M.D.   On: 12/20/2016 15:32    Procedures Procedures (including critical care time)  Medications Ordered in ED Medications  ibuprofen (ADVIL,MOTRIN) tablet 600 mg (600 mg Oral Given 12/20/16 1644)     Initial Impression / Assessment and Plan / ED Course  I have reviewed the triage vital signs and the nursing notes.  Pertinent labs & imaging results that were available during my care of the patient were reviewed by me and considered in my medical decision making (see chart for details).  Clinical Course as of Dec 20 1749  Thu Dec 20, 2016  1705 Discussed patient case with Dr. Zenovia Jarred who is in agreement with the plan of care.  [AM]  1735 Patient information searched in the [place] Controlled Substance Reporting System and no controlled substance prescriptions documented for the past 6 months. Proceeded with prescribing a short course of Norco for fracture pain.  [AM]    Clinical Course User Index [AM] Albesa Seen, PA-C    Final Clinical Impressions(s) / ED Diagnoses   Final diagnoses:  Ankle pain   MDM  Patient is a 41 year old female with a past history of diabetes and hypertension presenting to the emergency department after a fall which resulted in a lateral malleolus avulsion fracture of the right ankle. The mortise of the ankle is  stable. Patient reports some paresthesias and numbness of the toes of the right foot however pulses are equal in right and left foot, foot is well perfused, there is no pallor to the foot, and foot is warm. I doubt compartment syndrome at this time, however patient given return precautions should further symptoms develop. In addition to the ankle, proximal tibia and fibula x-ray performed. No Maissoneuve fracture suspected at this time based on radiographic evidence. Patient given a CAM boot in the emergency department and instructions to follow up with orthopedics as soon as  possible. Patient given prescription for 8 pills of Norco for pain control. Patient given precautions not to drive or perform her work while taking this medication. Patient is in agreement with plan of care.  Patient reports that she received a tetanus shot at the [facility] clinic a few months ago.   New Prescriptions New Prescriptions   No medications on file        Tamala Julian 12/20/16 2251    Macarthur Critchley, MD 12/20/16 [phone]

## 2016-12-27 ENCOUNTER — Ambulatory Visit (INDEPENDENT_AMBULATORY_CARE_PROVIDER_SITE_OTHER): Payer: Self-pay | Admitting: Orthopaedic Surgery

## 2016-12-27 ENCOUNTER — Ambulatory Visit (INDEPENDENT_AMBULATORY_CARE_PROVIDER_SITE_OTHER): Payer: Self-pay

## 2016-12-27 ENCOUNTER — Encounter (INDEPENDENT_AMBULATORY_CARE_PROVIDER_SITE_OTHER): Payer: Self-pay | Admitting: Orthopaedic Surgery

## 2016-12-27 DIAGNOSIS — M79675 Pain in left toe(s): Secondary | ICD-10-CM

## 2016-12-27 DIAGNOSIS — S8261XA Displaced fracture of lateral malleolus of right fibula, initial encounter for closed fracture: Secondary | ICD-10-CM

## 2016-12-27 HISTORY — DX: Displaced fracture of lateral malleolus of right fibula, initial encounter for closed fracture: S82.61XA

## 2016-12-27 MED ORDER — HYDROCODONE-ACETAMINOPHEN 5-325 MG PO TABS: 1.0000 | ORAL_TABLET | Freq: Four times a day (QID) | ORAL | 0 refills | Status: DC | PRN

## 2016-12-27 NOTE — Progress Notes (Signed)
Office Visit Note   Patient: Lori Shea           Date of Birth: Sep 05, 1975           MRN: [id number] Visit Date: 12/27/2016              Requested by: Mack Hook, MD [place], [place] [address] PCP: Mack Hook, MD   Assessment & Plan: Visit Diagnoses:  1. Closed avulsion fracture of lateral malleolus of right fibula, initial encounter   2. Pain in left toe(s)     Plan: I showed her her x-rays and do feel that given the job she performs that we need to keep her out of work for at least the next 2-4 weeks. She can still try weightbearing as tolerated and should certainly continue the cam walking boot on the right side. She should come out of it to work on pumping her feet and making sure she has good circulation and does not have to sleep in her boot. I gave her a note to keep her out of work for a minimum of 2-4 weeks I did have her a one time prescription for hydrocodone. I would like to see her back in 2 weeks for repeat exam but no x-rays are needed. All questions were encouraged and answered.  Follow-Up Instructions: Return in about 2 weeks (around 01/10/2017).   Orders:  Orders Placed This Encounter  Procedures  . XR Toe 2nd Left  . XR Toe 3rd Left   Meds ordered this encounter  Medications  . HYDROcodone-acetaminophen (NORCO/VICODIN) 5-325 MG tablet    Sig: Take 1 tablet by mouth every 6 (six) hours as needed for moderate pain.    Dispense:  30 tablet    Refill:  0      Procedures: No procedures performed   Clinical Data: No additional findings.   Subjective: Chief Complaint  Patient presents with  . Right Ankle - Pain, Fracture  . Left Foot - Pain  The patient is a very pleasant 41 year old female referred from emergency room after sustaining an axonal mechanical fall on a parking lot about a week ago. She injured both her left foot and her right ankle but her right ankle showed an avulsion fracture of the tip of the  lateral malleolus and she is placed appropriately in a cam walking boot. She is expansion a lot of bruising and swelling in the right foot and ankle but is also noted bruising in her second and third toes on the left foot and she knew these evaluated today. 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 She says her pain is manageable with mainly ibuprofen but would like a few more hydrocodone. She's not been driving and putting minimal weight. She is a diabetic but denies any peripheral neuropathy.  HPI  Review of Systems She denies any chest pain, headache, shortness of breath, fever, chills, nausea, vomiting.  Objective: Vital Signs: LMP 12/11/2016 (Exact Date)   Physical Exam She is alert and 3 and in no acute distress. Ortho Exam Examination of the right ankle shows pain over the lateral malleolus and mainly the anterior talofibular ligament area. The ankles well located and neurovascular intact. There is bruising as well. Examination of her left foot and ankle does show bruising of the second third toes and pain especially on the dorsum of the third toe. Both feet are well perfused. Specialty Comments:  No specialty comments available.  Imaging: Xr Toe 2nd Left  Result Date: 12/27/2016 2 views of the left second toe show no acute findings.  Xr Toe 3rd Left  Result Date: 12/27/2016 X-rays of the third toe show possibly a small avulsion fracture at the distal phalanx dorsally. The overall alignment of the bone and joints appear anatomic.  I did independently review x-rays of the right ankle from emergency room and it does show a small avulsion fracture off the tip of the lateral malleolus. The ankle joint itself as well as located with no evidence of instability.  PMFS History: Patient Active Problem List   Diagnosis Date Noted  . Pain in left toe(s) 12/27/2016  . Closed avulsion fracture of lateral malleolus of right fibula 12/27/2016  . Morbid obesity ([place])  09/13/2016  . Elevated liver enzymes 09/13/2016  . Allergy   . Asthma   . Kidney stones   . Cholelithiasis   . Hypertension 04/23/2013  . Irregular menstrual bleeding 02/22/2011  . Diabetes ([place]) 04/23/2010  . PCOS (polycystic ovarian syndrome) 04/23/2008  . Migraines 04/24/2007  . ASTHMA NOS W/ACUTE EXACERBATION 09/27/2006  . VAGINAL DELIVERY, HX OF 09/27/2006   Past Medical History:  Diagnosis Date  . Allergy    Foods:  mango, honey, corn.  Allergic to "everything tested"  15 years ago in [place]  . Asthma age 32 yo   Triggers:  cold weather, exercise, allergens, anxiety, URIs  . Cholelithiasis age 61 yo   Asymptomatic  . Diabetes mellitus 2012   Was diagnosed 2 years earlier with PCOS  . Elevated liver enzymes 09/13/2016  . Hypertension 2015  . Kidney stone   . Kidney stones   . Migraines 2009  . Morbid obesity ([place])   . PCOS (polycystic ovarian syndrome) 2010    Family History  Problem Relation Age of Onset  . Diabetes Father   . Hyperlipidemia Father   . Hypertension Father   . Heart disease Father   . Kidney disease Father        Developed after 2nd CABG  . Diabetes Paternal Grandfather   . Heart disease Paternal Grandfather   . Hyperlipidemia Paternal Grandfather   . Hypertension Paternal Grandfather   . Kidney disease Paternal Grandfather     No past surgical history on file. Social History   Occupational History  . CNA     [employer]   Social History Main Topics  . Smoking status: Never Smoker  . Smokeless tobacco: Never Used  . Alcohol use No  . Drug use: No  . Sexual activity: Yes    Birth control/ protection: Pill     Comment: Sprintec

## 2017-01-15 ENCOUNTER — Ambulatory Visit (INDEPENDENT_AMBULATORY_CARE_PROVIDER_SITE_OTHER): Payer: Self-pay | Admitting: Orthopaedic Surgery

## 2017-01-15 DIAGNOSIS — S8261XD Displaced fracture of lateral malleolus of right fibula, subsequent encounter for closed fracture with routine healing: Secondary | ICD-10-CM

## 2017-01-15 NOTE — Progress Notes (Signed)
The patient is returning for follow-up after having a severe right ankle sprain with a small avulsion off the tip of the lateral malleolus. She's been in a cam walking boot and still hasn't put weight on her foot. It's been getting close to a month since her injury.  On exam she still has some swelling over the lateral ankle and the lateral talofibular joint medial side there is no swelling and no pain. The ankle is well located and ligaments stable. She is neurovascular intact as well. I did not x-ray anything today based on her last x-ray showing it more of a sprain than the small avulsion at the very tip of the lateral malleolus.  Repair transition her to an ASO today and encouraged her to put full weight on her ankle as comfort allows. We'll see her back in 4 weeks to see how she is doing overall to see if she needs therapy or not. No x-rays are needed. We'll allow her to return to her job on October 8.

## 2017-02-12 ENCOUNTER — Ambulatory Visit (INDEPENDENT_AMBULATORY_CARE_PROVIDER_SITE_OTHER): Payer: Medicaid Other | Admitting: Orthopaedic Surgery

## 2017-02-13 ENCOUNTER — Other Ambulatory Visit: Payer: Self-pay

## 2017-02-13 MED ORDER — FLUTICASONE-SALMETEROL 100-50 MCG/DOSE IN AEPB: 1.0000 | INHALATION_SPRAY | Freq: Two times a day (BID) | RESPIRATORY_TRACT | 2 refills | Status: DC

## 2017-03-25 ENCOUNTER — Ambulatory Visit (HOSPITAL_COMMUNITY)
Admission: EM | Admit: 2017-03-25 | Discharge: 2017-03-25 | Disposition: A | Discharge status: Home / Self Care | Payer: No Typology Code available for payment source | Attending: Urgent Care | Admitting: Urgent Care

## 2017-03-25 ENCOUNTER — Encounter (HOSPITAL_COMMUNITY): Payer: Self-pay | Admitting: *Deleted

## 2017-03-25 DIAGNOSIS — J329 Chronic sinusitis, unspecified: Secondary | ICD-10-CM

## 2017-03-25 DIAGNOSIS — H9201 Otalgia, right ear: Secondary | ICD-10-CM

## 2017-03-25 DIAGNOSIS — J3089 Other allergic rhinitis: Secondary | ICD-10-CM

## 2017-03-25 DIAGNOSIS — J309 Allergic rhinitis, unspecified: Secondary | ICD-10-CM

## 2017-03-25 DIAGNOSIS — J4540 Moderate persistent asthma, uncomplicated: Secondary | ICD-10-CM

## 2017-03-25 MED ORDER — AMOXICILLIN-POT CLAVULANATE 875-125 MG PO TABS: 1.0000 | ORAL_TABLET | Freq: Two times a day (BID) | ORAL | 0 refills | Status: DC

## 2017-03-25 MED ORDER — METHYLPREDNISOLONE ACETATE 80 MG/ML IJ SUSP
INTRAMUSCULAR | Status: AC
  Filled 2017-03-25: qty 1

## 2017-03-25 MED ORDER — METHYLPREDNISOLONE ACETATE 80 MG/ML IJ SUSP
80.0000 mg | Freq: Once | INTRAMUSCULAR | Status: AC
  Administered 2017-03-25: 80 mg via INTRAMUSCULAR

## 2017-03-25 MED ORDER — PSEUDOEPHEDRINE HCL ER 120 MG PO TB12: 120.0000 mg | ORAL_TABLET | Freq: Two times a day (BID) | ORAL | 3 refills | Status: DC

## 2017-03-25 NOTE — ED Provider Notes (Signed)
MRN: [id number] DOB: 1975-08-01  Subjective:   Lori Shea is a 41 y.o. female presenting for 4 day history of recurrent sinus congestion, fever (highest was 103F), right ear pain, sinus pain, burning sensation of her eyes, sore throat, shob. Has had a mild dry cough. She has undergone a course of amoxicillin for 3 weeks in November, received a steroid injection then as well. Has a history of asthma, has been using nebulizer treatments twice yesterday and today. Has also used Advil for cold/sinus. Denies chest pain, abdominal pain. Denies smoking cigarettes.  No current facility-administered medications for this encounter.    Current Outpatient Medications  Medication Sig Dispense Refill  . albuterol (PROVENTIL HFA;VENTOLIN HFA) 108 (90 Base) MCG/ACT inhaler Inhale 2 puffs into the lungs every 4 (four) hours as needed for wheezing or shortness of breath.    Marland Kitchen albuterol (PROVENTIL) (2.5 MG/3ML) 0.083% nebulizer solution Take 3 mLs (2.5 mg total) by nebulization every 6 (six) hours as needed for wheezing or shortness of breath. 75 mL 0  . amitriptyline (ELAVIL) 25 MG tablet Take 25 mg by mouth at bedtime.    Marland Kitchen Dexlansoprazole (DEXILANT) 30 MG capsule Take 1 capsule (30 mg total) by mouth daily. Take on empty stomach 30 capsule 11  . Fluticasone-Salmeterol (ADVAIR DISKUS) 100-50 MCG/DOSE AEPB Inhale 1 puff into the lungs every 12 (twelve) hours. 60 each 2  . HYDROcodone-acetaminophen (NORCO/VICODIN) 5-325 MG tablet Take 1 tablet by mouth every 6 (six) hours as needed for moderate pain. 30 tablet 0  . insulin glargine (LANTUS) 100 UNIT/ML injection Inject 0.5 mLs (50 Units total) into the skin at bedtime. 10 mL 5  . meloxicam (MOBIC) 15 MG tablet Take 15 mg by mouth daily.    . metFORMIN (GLUCOPHAGE-XR) 500 MG 24 hr tablet 1 tab by mouth twice daily with meals 60 tablet 11  . Misc Natural Products ([employer] [facility] ADVANCED) CAPS 1 cap by mouth twice daily    . montelukast (SINGULAIR) 10 MG tablet  Take 1 tablet (10 mg total) by mouth daily. 30 tablet 11  . Multiple Vitamins-Minerals (MULTIVITAMIN ADULT PO) Take 1 tablet by mouth daily.    . norgestimate-ethinyl estradiol (ORTHO-CYCLEN,SPRINTEC,PREVIFEM) 0.25-35 MG-MCG tablet Take 1 tablet by mouth daily. 1 Package 11  . olmesartan-hydrochlorothiazide (BENICAR HCT) 40-25 MG tablet Take 1 tablet by mouth daily. 30 tablet 11  . rizatriptan (MAXALT-MLT) 10 MG disintegrating tablet Take 1 tablet (10 mg total) by mouth as needed for migraine. May repeat in 2 hours if needed.  Max 30 mg/24 h 12 tablet 6    Lori Shea is allergic to corn-containing products; honey; mango flavor; and victoza [liraglutide].  Lori Shea  has a past medical history of Allergy, Asthma (age 67 yo), Cholelithiasis (age 86 yo), Diabetes mellitus (2012), Elevated liver enzymes (09/13/2016), Hypertension (2015), Kidney stone, Kidney stones, Migraines (2009), Morbid obesity ([place]), and PCOS (polycystic ovarian syndrome) (2010). Denies past surgical history.   Objective:   Vitals: BP 131/78   Pulse 89   Temp 98.9 F (37.2 C) (Oral)   Resp 19   SpO2 100%   Physical Exam  Constitutional: She is oriented to person, place, and time. She appears well-developed and well-nourished.  HENT:  Right TM erythematous and slightly bulging. Left TM normal. Nasal turbinates boggy and edematous, nasal passages minimally patent. Bilateral maxillary sinus tenderness. Oropharynx clear, mucous membranes moist.   Eyes: Right eye exhibits no discharge. Left eye exhibits no discharge.  Neck: Normal range of motion. Neck supple.  Cardiovascular:  Normal rate, regular rhythm and intact distal pulses. Exam reveals no gallop and no friction rub.  No murmur heard. Pulmonary/Chest: No respiratory distress. She has no wheezes. She has no rales.  Lymphadenopathy:    She has no cervical adenopathy.  Neurological: She is alert and oriented to person, place, and time.  Skin: Skin is warm and dry.   Psychiatric: She has a normal mood and affect.   Assessment and Plan :   Chronic sinusitis, unspecified location  Right ear pain  Allergic rhinitis due to other allergic trigger, unspecified seasonality  Moderate persistent extrinsic asthma without complication  Will use Augmentin to address sinusitis and right ear otitis media secondary to chronic allergic rhinitis. IM Depomedrol today for the latter. Schedule albuterol inhaler. Return-to-clinic precautions discussed, patient verbalized understanding.   Jaynee Eagles, PA-C [facility]  03/25/2017  11:15 AM    Jaynee Eagles, PA-C 03/25/17 1132

## 2017-03-25 NOTE — ED Triage Notes (Signed)
Patient reports nasal congestion, fever, ear pain, sore throat, and sob. Patient with hx of asthma. Patient reports hx of sinus infections. Patient reports she has been using breathing tx's. Patient states took course of antibiotics in October for sinus infections.

## 2017-03-25 NOTE — Discharge Instructions (Addendum)
We will be using Augmentin to address infection for both your sinuses and right ear. This infection is secondary though to chronic allergic rhinitis. Use either Zyrtec, Allegra or Claritin daily to address your allergic rhinitis. Maintain your Singulair but stop the nasal steroid until you get better for a period of at least 2 weeks. Use Sudafed for now as well to help with sinus congestion. Hydrate very well with at least 2 liters of water daily.

## 2017-04-08 ENCOUNTER — Ambulatory Visit (INDEPENDENT_AMBULATORY_CARE_PROVIDER_SITE_OTHER): Payer: Self-pay | Admitting: Family Medicine

## 2017-04-08 ENCOUNTER — Encounter: Payer: Self-pay | Admitting: Family Medicine

## 2017-04-08 VITALS — BP 140/88 | HR 102 | Temp 98.7°F | Resp 16 | Ht 66.0 in | Wt 272.0 lb

## 2017-04-08 DIAGNOSIS — J329 Chronic sinusitis, unspecified: Secondary | ICD-10-CM

## 2017-04-08 DIAGNOSIS — Z794 Long term (current) use of insulin: Secondary | ICD-10-CM

## 2017-04-08 DIAGNOSIS — E119 Type 2 diabetes mellitus without complications: Secondary | ICD-10-CM

## 2017-04-08 DIAGNOSIS — F418 Other specified anxiety disorders: Secondary | ICD-10-CM

## 2017-04-08 DIAGNOSIS — J4530 Mild persistent asthma, uncomplicated: Secondary | ICD-10-CM

## 2017-04-08 LAB — GLUCOSE, CAPILLARY: GLUCOSE-CAPILLARY: 214 mg/dL — AB (ref 65–99)

## 2017-04-08 LAB — POCT GLYCOSYLATED HEMOGLOBIN (HGB A1C): HEMOGLOBIN A1C: 9.5

## 2017-04-08 MED ORDER — MOMETASONE FUROATE 50 MCG/ACT NA SUSP: 2.0000 | Freq: Every day | NASAL | 12 refills | Status: DC

## 2017-04-08 MED ORDER — INSULIN GLARGINE 100 UNIT/ML ~~LOC~~ SOLN: 55.0000 [IU] | Freq: Every day | SUBCUTANEOUS | 5 refills | Status: DC

## 2017-04-08 MED ORDER — CETIRIZINE HCL 10 MG PO TABS: 10.0000 mg | ORAL_TABLET | Freq: Every day | ORAL | 11 refills | Status: DC

## 2017-04-08 MED ORDER — METFORMIN HCL 1000 MG PO TABS: 1000.0000 mg | ORAL_TABLET | Freq: Two times a day (BID) | ORAL | 11 refills | Status: DC

## 2017-04-08 NOTE — Patient Instructions (Addendum)
Your hemoglobin A1c is 9.5 once a day.  Will increase metformin to 1000 mg twice daily with meals.  We will also increase Lantus to 55 units at bedtime.  Recommend a carbohydrate modify diet divided over 5-6 small meals throughout the day, also increase water intake to 6-8 glasses/day. Recommend 150 minutes of cardiovascular exercise per week. Your blood pressure is at goal on current medication regimen no changes warranted on today.  Your A1C goal is less than 7. Your fasting blood sugar  Upon awakening goal is between 110-140.  Your LDL  (bad cholesterol goal is less than 100 Blood pressure goal is <140/90.  Recommend a lowfat, low carbohydrate diet divided over 5-6 small meals, increase water intake to 6-8 glasses, and 150 minutes per week of cardiovascular exercise.   Take your medications as prescribed Make sure that you are familiar with each one of your medications and what they are used to treat.  If you are unsure of medications, please bring to follow up Will send referral for eye exam  Please keep your scheduled follow up appointment.

## 2017-04-08 NOTE — Progress Notes (Signed)
Subjective:    Patient ID: Lori Shea, female    DOB: Jan 18, 1976, 41 y.o.   MRN: [id number]  HPI Lori Shea, a 41 year old female with a history of uncontrolled type 2 diabetes, hypertension, polycystic ovarian syndrome, and morbid obesity presents to establish care.  Lori Shea states that she was a patient of [facility] prior to establishing care.  She states that she has been taking all prescribed medications consistently.  She does not follow a carbohydrate modify diet or exercise routine.  Her last eye exam was greater than a year ago.  She does not check her feet routinely for diabetic ulcers or skin breakdown.  Her last Pap smear was greater than 3 years ago and she is not up-to-date with mammogram.  She currently denies blurred vision, dizziness, heart palpitations, shortness of breath, abdominal pain, polyuria, polydipsia, or polyphagia.  She feels well on today and is without complaint.  Lori Shea also has a history of mild intermittent asthma. Her last exacerbation was several months ago.  Patient states that asthma has been under control since starting daily Advair every 12 hours.  She typically does not have to use albuterol rescue inhaler.  She also takes daily Singulair.  Patient denies coughing, shortness of breath, chest tightness, or wheezing.  Patient endorses a history of allergic rhinitis.  She states that she has had drainage for over the past several weeks.  She was recently treated in the emergency department for chronic sinusitis.  She underwent a course of amoxicillin and steroids.  Past Medical History:  Diagnosis Date  . Allergy    Foods:  mango, honey, corn.  Allergic to "everything tested"  15 years ago in [place]  . Asthma age 3 yo   Triggers:  cold weather, exercise, allergens, anxiety, URIs  . Cholelithiasis age 30 yo   Asymptomatic  . Diabetes mellitus 2012   Was diagnosed 2 years earlier with PCOS  . Elevated liver enzymes 09/13/2016  .  Hypertension 2015  . Kidney stone   . Kidney stones   . Migraines 2009  . Morbid obesity ([place])   . PCOS (polycystic ovarian syndrome) 2010   Review of Systems  Constitutional: Negative for fatigue and fever.  HENT: Negative.   Eyes: Negative.  Negative for photophobia and visual disturbance.  Respiratory: Negative.   Cardiovascular: Negative.   Gastrointestinal: Negative.   Endocrine: Negative for polydipsia, polyphagia and polyuria.  Genitourinary: Negative.   Musculoskeletal: Negative.   Skin: Negative.   Allergic/Immunologic: Negative for immunocompromised state.  Neurological: Negative.   Hematological: Negative.   Psychiatric/Behavioral: Negative.        Objective:      BP 140/88 (BP Location: Left Arm, Patient Position: Sitting, Cuff Size: Large) Comment: manually  Pulse (!) 102   Temp 98.7 F (37.1 C) (Oral)   Resp 16   Ht 5\' 6"  (1.676 m)   Wt 272 lb (123.4 kg)   LMP 04/06/2017   SpO2 99%   BMI 43.90 kg/m  Assessment & Plan:  Type 2 diabetes mellitus without complication, with long-term current use of insulin (HCC) Hemoglobin A1c is 9.5, which is above goal.  Will increase Lantus to 55 units at bedtime.  Will increase metformin to 1000 mg twice daily with meals.  Discussed the importance of maintaining carbohydrate modify diet and cardiovascular exercise. - HgB A1c - Urinalysis, Routine w reflex microscopic - insulin glargine (LANTUS) 100 UNIT/ML injection; Inject 0.55 mLs (55 Units total) into the  skin at bedtime.  Dispense: 10 mL; Refill: 5 - Lipid Panel; Future - metFORMIN (GLUCOPHAGE) 1000 MG tablet; Take 1 tablet (1,000 mg total) by mouth 2 (two) times daily with a meal.  Dispense: 60 tablet; Refill: 11 - CMP and Liver  Situational anxiety Patient complains of  feelings of anxiety and panic when exposed to loud noises.  She states that these feelings have recently developed.  She denies feelings of losing control, anhedonia, constant worrying, or racing  thoughts.  She states that these feelings occur when around small children that are very  loud.  Patient warrants referral to psychology to discuss feelings of anxiety further. - Ambulatory referral to Psychology  Chronic sinusitis, unspecified location - mometasone (NASONEX) 50 MCG/ACT nasal spray; Place 2 sprays into the nose daily.  Dispense: 17 g; Refill: 12 - cetirizine (ZYRTEC) 10 MG tablet; Take 1 tablet (10 mg total) by mouth daily.  Dispense: 30 tablet; Refill: 11  Mild persistent asthma without complication We will continue Advair as previously prescribed  Class 2 severe obesity with serious comorbidity in adult, unspecified BMI, unspecified obesity type ([place]) Recommend a lowfat, low carbohydrate diet divided over 5-6 small meals, increase water intake to 6-8 glasses, and 150 minutes per week of cardiovascular exercise.   - TSH   Return to office in 3 months for chronic conditions   Donia Pounds  MSN, FNP-C [facility] [address] [place], [place] [address] [phone]

## 2017-04-09 LAB — MICROSCOPIC EXAMINATION: Casts: NONE SEEN /lpf

## 2017-04-09 LAB — URINALYSIS, ROUTINE W REFLEX MICROSCOPIC
Bilirubin, UA: NEGATIVE
GLUCOSE, UA: NEGATIVE
KETONES UA: NEGATIVE
Leukocytes, UA: NEGATIVE
NITRITE UA: NEGATIVE
PROTEIN UA: NEGATIVE
SPEC GRAV UA: 1.013 (ref 1.005–1.030)
UUROB: 0.2 mg/dL (ref 0.2–1.0)
pH, UA: 6 (ref 5.0–7.5)

## 2017-04-09 LAB — CMP AND LIVER
ALT: 49 IU/L — AB (ref 0–32)
AST: 32 IU/L (ref 0–40)
Albumin: 3.7 g/dL (ref 3.5–5.5)
Alkaline Phosphatase: 89 IU/L (ref 39–117)
BILIRUBIN TOTAL: 0.2 mg/dL (ref 0.0–1.2)
BILIRUBIN, DIRECT: 0.08 mg/dL (ref 0.00–0.40)
BUN: 13 mg/dL (ref 6–24)
CALCIUM: 8.9 mg/dL (ref 8.7–10.2)
CHLORIDE: 103 mmol/L (ref 96–106)
CO2: 23 mmol/L (ref 20–29)
Creatinine, Ser: 0.52 mg/dL — ABNORMAL LOW (ref 0.57–1.00)
GFR calc non Af Amer: 118 mL/min/{1.73_m2} (ref >59)
GFR, EST AFRICAN AMERICAN: 136 mL/min/{1.73_m2} (ref >59)
GLUCOSE: 274 mg/dL — AB (ref 65–99)
Potassium: 4.1 mmol/L (ref 3.5–5.2)
Sodium: 139 mmol/L (ref 134–144)
Total Protein: 6.5 g/dL (ref 6.0–8.5)

## 2017-04-09 LAB — TSH: TSH: 4.37 u[IU]/mL (ref 0.450–4.500)

## 2017-04-25 ENCOUNTER — Encounter: Payer: Self-pay | Admitting: Family Medicine

## 2017-05-01 ENCOUNTER — Other Ambulatory Visit: Payer: Self-pay | Admitting: Family Medicine

## 2017-05-03 ENCOUNTER — Encounter: Payer: Self-pay | Admitting: Family Medicine

## 2017-05-03 ENCOUNTER — Other Ambulatory Visit: Payer: Self-pay

## 2017-05-03 ENCOUNTER — Ambulatory Visit (INDEPENDENT_AMBULATORY_CARE_PROVIDER_SITE_OTHER): Payer: Self-pay | Admitting: Family Medicine

## 2017-05-03 VITALS — BP 138/90 | HR 96 | Temp 98.6°F | Resp 16 | Ht 66.0 in | Wt 266.0 lb

## 2017-05-03 DIAGNOSIS — E119 Type 2 diabetes mellitus without complications: Secondary | ICD-10-CM

## 2017-05-03 DIAGNOSIS — N926 Irregular menstruation, unspecified: Secondary | ICD-10-CM

## 2017-05-03 DIAGNOSIS — Z794 Long term (current) use of insulin: Secondary | ICD-10-CM

## 2017-05-03 LAB — GLUCOSE, CAPILLARY: Glucose-Capillary: 208 mg/dL — ABNORMAL HIGH (ref 65–99)

## 2017-05-03 MED ORDER — AMITRIPTYLINE HCL 25 MG PO TABS: 25.0000 mg | ORAL_TABLET | Freq: Every day | ORAL | 3 refills | Status: DC

## 2017-05-03 MED ORDER — MONTELUKAST SODIUM 10 MG PO TABS: 10.0000 mg | ORAL_TABLET | Freq: Every day | ORAL | 3 refills | Status: DC

## 2017-05-03 MED ORDER — DEXLANSOPRAZOLE 30 MG PO CPDR: 30.0000 mg | DELAYED_RELEASE_CAPSULE | Freq: Every day | ORAL | 3 refills | Status: DC

## 2017-05-03 MED ORDER — NORGESTIMATE-ETH ESTRADIOL 0.25-35 MG-MCG PO TABS: 1.0000 | ORAL_TABLET | Freq: Every day | ORAL | 3 refills | Status: DC

## 2017-05-03 MED ORDER — OLMESARTAN MEDOXOMIL-HCTZ 40-25 MG PO TABS: 1.0000 | ORAL_TABLET | Freq: Every day | ORAL | 3 refills | Status: DC

## 2017-05-03 MED ORDER — METFORMIN HCL 500 MG PO TABS
500.0000 mg | ORAL_TABLET | Freq: Two times a day (BID) | ORAL | 0 refills | Status: DC
Start: 2017-05-03 — End: 2017-11-22

## 2017-05-03 NOTE — Progress Notes (Signed)
Patient ID: Lori Shea, female    DOB: [birth year], 42 y.o.   MRN: [id number]  PCP: Lori Dew, FNP  Chief Complaint  Patient presents with  . Hyperglycemia    Subjective:  HPI  Patient seen on-behalf Lori Sickle, FNP-C   Lori Shea is a 42 y.o. female with type 2 diabetes, morbid obesity, hypertension presents for evaluation of recent episodes of hyperglycemia.  Lori Shea reports since Christmas experiencing elevated blood sugar readings. She has been out of town for the holidays and admits to poor food choices. Her blood sugars have ranged 280-300. Over the last few days, she reports eliminating carbs, and increasing protein, although has not noticed any change in her morning readings. She also complains of metformin intolerance with the increased dose of 1000 mg twice daily. She reports previously she was able to tolerate metformin 500 mg twice daily without GI disturbances, as of recent she is having loose stools and abdominal distress with the higher dose. Lori Shea was last seen in office 04/08/2017, while at that visit, her Lantus dose was increased to 55 units daily. She reports that she misunderstood and currently continues to administer 50 units at bedtime and admits to not eating a high protein snack with Lantus administration. She denies urinary frequency, visual disturbances, polyphagia, dizziness, chest pain, or shortness of breath.   Social History   Socioeconomic History  . Marital status: Divorced    Spouse name: Not on file  . Number of children: 1  . Years of education: some comm. college  . Highest education level: Not on file  Social Needs  . Financial resource strain: Not on file  . Food insecurity - worry: Not on file  . Food insecurity - inability: Not on file  . Transportation needs - medical: Not on file  . Transportation needs - non-medical: Not on file  Occupational History  . Occupation: CNA    Comment: Multimedia programmer   Tobacco Use  . Smoking status: Never Smoker  . Smokeless tobacco: Never Used  Substance and Sexual Activity  . Alcohol use: No  . Drug use: No  . Sexual activity: Yes    Birth control/protection: Pill    Comment: Sprintec  Other Topics Concern  . Not on file  Social History Narrative   Originally from [place], [place] to [place] in 1988.   In [place] in [place] at home with daughter on [place].    Family History  Problem Relation Age of Onset  . Diabetes Father   . Hyperlipidemia Father   . Hypertension Father   . Heart disease Father   . Kidney disease Father        Developed after 2nd CABG  . Diabetes Paternal Grandfather   . Heart disease Paternal Grandfather   . Hyperlipidemia Paternal Grandfather   . Hypertension Paternal Grandfather   . Kidney disease Paternal Grandfather    Review of Systems  Constitutional: Negative.   Respiratory: Negative.   Cardiovascular: Negative.   Endocrine: Positive for polydipsia and polyuria.  Neurological: Negative.   Psychiatric/Behavioral: Negative.    Patient Active Problem List   Diagnosis Date Noted  . Pain in left toe(s) 12/27/2016  . Closed avulsion fracture of lateral malleolus of right fibula 12/27/2016  . Morbid obesity ([place]) 09/13/2016  . Elevated liver enzymes 09/13/2016  . Allergy   . Asthma   . Kidney stones   . Cholelithiasis   . Hypertension 04/23/2013  .  Irregular menstrual bleeding 02/22/2011  . Diabetes ([place]) 04/23/2010  . PCOS (polycystic ovarian syndrome) 04/23/2008  . Migraines 04/24/2007  . ASTHMA NOS W/ACUTE EXACERBATION 09/27/2006  . VAGINAL DELIVERY, HX OF 09/27/2006    Allergies  Allergen Reactions  . Corn-Containing Products   . Honey Shortness Of Breath  . 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 Victoza [Liraglutide] Nausea And Vomiting    Prior to Admission medications   Medication Sig Start Date End Date Taking? Authorizing Provider  albuterol  (PROVENTIL HFA;VENTOLIN HFA) 108 (90 Base) MCG/ACT inhaler Inhale 2 puffs into the lungs every 4 (four) hours as needed for wheezing or shortness of breath.   Yes [provider]  albuterol (PROVENTIL) (2.5 MG/3ML) 0.083% nebulizer solution Take 3 mLs (2.5 mg total) by nebulization every 6 (six) hours as needed for wheezing or shortness of breath. 06/13/16  Yes Mack Hook, MD  amitriptyline (ELAVIL) 25 MG tablet Take 25 mg by mouth at bedtime.   Yes [provider]  Black Elderberry,Berry-Flower, 575 MG CAPS Take 1 capsule by mouth daily.   Yes [provider]  cetirizine (ZYRTEC) 10 MG tablet Take 1 tablet (10 mg total) by mouth daily. 04/08/17  Yes Lori Dew, FNP  Dexlansoprazole (DEXILANT) 30 MG capsule Take 1 capsule (30 mg total) by mouth daily. Take on empty stomach 06/13/16  Yes Mack Hook, MD  Fluticasone-Salmeterol (ADVAIR DISKUS) 100-50 MCG/DOSE AEPB Inhale 1 puff into the lungs every 12 (twelve) hours. 02/13/17  Yes Mack Hook, MD  insulin glargine (LANTUS) 100 UNIT/ML injection Inject 0.55 mLs (55 Units total) into the skin at bedtime. 04/08/17  Yes Lori Dew, FNP  meloxicam (MOBIC) 15 MG tablet Take 15 mg by mouth daily.   Yes [provider]  metFORMIN (GLUCOPHAGE) 1000 MG tablet Take 1 tablet (1,000 mg total) by mouth 2 (two) times daily with a meal. 04/08/17  Yes Lori Dew, FNP  mometasone (NASONEX) 50 MCG/ACT nasal spray Place 2 sprays into the nose daily. 04/08/17  Yes Lori Dew, FNP  montelukast (SINGULAIR) 10 MG tablet Take 1 tablet (10 mg total) by mouth daily. 06/13/16  Yes Mack Hook, MD  Multiple Vitamins-Minerals (MULTIVITAMIN ADULT PO) Take 1 tablet by mouth daily.   Yes [provider]  olmesartan-hydrochlorothiazide (BENICAR HCT) 40-25 MG tablet Take 1 tablet by mouth daily. 06/22/16  Yes Mack Hook, MD  rizatriptan (MAXALT-MLT) 10 MG disintegrating tablet  Take 1 tablet (10 mg total) by mouth as needed for migraine. May repeat in 2 hours if needed.  Max 30 mg/24 h 09/13/16  Yes Mack Hook, MD  norgestimate-ethinyl estradiol (ORTHO-CYCLEN,SPRINTEC,PREVIFEM) 0.25-35 MG-MCG tablet Take 1 tablet by mouth daily. 02/22/11 04/08/17  Osborne Oman, MD    Past Medical, Surgical Family and Social History reviewed and updated.    Objective:   Today's Vitals   05/03/17 1402  BP: 138/90  Pulse: 96  Resp: 16  Temp: 98.6 F (37 C)  TempSrc: Oral  SpO2: 100%  Weight: 266 lb (120.7 kg)  Height: 5\' 6"  (1.676 m)    Wt Readings from Last 3 Encounters:  05/03/17 266 lb (120.7 kg)  04/08/17 272 lb (123.4 kg)  09/13/16 265 lb (120.2 kg)    Physical Exam Constitutional: Patient appears well-developed and well-nourished. No distress. HENT: Normocephalic, atraumatic, External right and left ear normal. Oropharynx is clear and moist.  Eyes: Conjunctivae and EOM are normal. PERRLA, no scleral icterus. Neck: Normal ROM.  Neck supple. No JVD. No tracheal deviation. No thyromegaly. CVS: RRR, S1/S2 +, no murmurs, no gallops, no carotid bruit.  Pulmonary: Effort and breath sounds normal, no stridor, rhonchi, wheezes, rales.  Abdominal: Soft. BS +, no distension, tenderness, rebound or guarding.  Musculoskeletal: Normal range of motion. No edema and no tenderness.  Lymphadenopathy: No lymphadenopathy noted, cervical. Neuro: Alert. Muscle tone coordination. Skin: Skin is warm and dry. No rash noted. Not diaphoretic. No erythema. No pallor. Psychiatric: Normal mood and affect. Behavior, judgment, thought content normal.   Assessment & Plan:  1. Type 2 diabetes mellitus without complication, with long-term current use of insulin ([place]), uncontrolled. -Start previously prescribed increased Lantus dose 55 units at bedtime with a high protein snack.  Will reduce metformin back to 500 mg twice daily with meals to increase compliance and toleration of  medication.   - METFORMIN (GLUCOPHAGE) 500 MG tablet; Take 1 tablet (500 mg total) by mouth 2 (two) times daily with a meal.  Dispense: 90 tablet; Refill: 0  Patient will follow-up with Lachina Hollis-FNP-C in 1 week with a log of blood sugar readings. She will discuss options for changing oral antidiabetes medication.   Carroll Sage. Kenton Kingfisher, MSN, FNP-C [facility]  [address] [place] C[place], [place] [address] [phone]

## 2017-05-03 NOTE — Telephone Encounter (Signed)
Refills sent into pharmacy for patient. Thanks!

## 2017-05-03 NOTE — Patient Instructions (Addendum)
Increase Lantus 55 units at bedtime with high protein snacks.  Keep log of blood sugars and bring with you to your next visit.   Diabetes Mellitus and Exercise Exercising regularly is important for your overall health, especially when you have diabetes (diabetes mellitus). Exercising is not only about losing weight. It has many health benefits, such as increasing muscle strength and bone density and reducing body fat and stress. This leads to improved fitness, flexibility, and endurance, all of which result in better overall health. Exercise has additional benefits for people with diabetes, including:  Reducing appetite.  Helping to lower and control blood glucose.  Lowering blood pressure.  Helping to control amounts of fatty substances (lipids) in the blood, such as cholesterol and triglycerides.  Helping the body to respond better to insulin (improving insulin sensitivity).  Reducing how much insulin the body needs.  Decreasing the risk for heart disease by: ? Lowering cholesterol and triglyceride levels. ? Increasing the levels of good cholesterol. ? Lowering blood glucose levels.  What is my activity plan? Your health care provider or certified diabetes educator can help you make a plan for the type and frequency of exercise (activity plan) that works for you. Make sure that you:  Do at least 150 minutes of moderate-intensity or vigorous-intensity exercise each week. This could be brisk walking, biking, or water aerobics. ? Do stretching and strength exercises, such as yoga or weightlifting, at least 2 times a week. ? Spread out your activity over at least 3 days of the week.  Get some form of physical activity every day. ? Do not go more than 2 days in a row without some kind of physical activity. ? Avoid being inactive for more than 90 minutes at a time. Take frequent breaks to walk or stretch.  Choose a type of exercise or activity that you enjoy, and set realistic  goals.  Start slowly, and gradually increase the intensity of your exercise over time.  What do I need to know about managing my diabetes?  Check your blood glucose before and after exercising. ? If your blood glucose is higher than 240 mg/dL (13.3 mmol/L) before you exercise, check your urine for ketones. If you have ketones in your urine, do not exercise until your blood glucose returns to normal.  Know the symptoms of low blood glucose (hypoglycemia) and how to treat it. Your risk for hypoglycemia increases during and after exercise. Common symptoms of hypoglycemia can include: ? Hunger. ? Anxiety. ? Sweating and feeling clammy. ? Confusion. ? Dizziness or feeling light-headed. ? Increased heart rate or palpitations. ? Blurry vision. ? Tingling or numbness around the mouth, lips, or tongue. ? Tremors or shakes. ? Irritability.  Keep a rapid-acting carbohydrate snack available before, during, and after exercise to help prevent or treat hypoglycemia.  Avoid injecting insulin into areas of the body that are going to be exercised. For example, avoid injecting insulin into: ? The arms, when playing tennis. ? The legs, when jogging.  Keep records of your exercise habits. Doing this can help you and your health care provider adjust your diabetes management plan as needed. Write down: ? Food that you eat before and after you exercise. ? Blood glucose levels before and after you exercise. ? The type and amount of exercise you have done. ? When your insulin is expected to peak, if you use insulin. Avoid exercising at times when your insulin is peaking.  When you start a new exercise or activity, work  with your health care provider to make sure the activity is safe for you, and to adjust your insulin, medicines, or food intake as needed.  Drink plenty of water while you exercise to prevent dehydration or heat stroke. Drink enough fluid to keep your urine clear or pale yellow. This  information is not intended to replace advice given to you by your health care provider. Make sure you discuss any questions you have with your health care provider. Document Released: 06/30/2003 Document Revised: 10/28/2015 Document Reviewed: 09/19/2015 Elsevier Interactive Patient Education  2018 [employer].

## 2017-05-09 ENCOUNTER — Other Ambulatory Visit: Payer: Self-pay

## 2017-05-09 ENCOUNTER — Encounter: Payer: Self-pay | Admitting: Family Medicine

## 2017-05-09 ENCOUNTER — Ambulatory Visit (INDEPENDENT_AMBULATORY_CARE_PROVIDER_SITE_OTHER): Payer: Self-pay | Admitting: Family Medicine

## 2017-05-09 VITALS — BP 119/79 | HR 88 | Temp 98.0°F | Resp 16 | Ht 66.0 in | Wt 267.0 lb

## 2017-05-09 DIAGNOSIS — L659 Nonscarring hair loss, unspecified: Secondary | ICD-10-CM

## 2017-05-09 DIAGNOSIS — I10 Essential (primary) hypertension: Secondary | ICD-10-CM

## 2017-05-09 DIAGNOSIS — Z794 Long term (current) use of insulin: Secondary | ICD-10-CM

## 2017-05-09 DIAGNOSIS — E119 Type 2 diabetes mellitus without complications: Secondary | ICD-10-CM

## 2017-05-09 LAB — POCT GLYCOSYLATED HEMOGLOBIN (HGB A1C): HEMOGLOBIN A1C: 10.2

## 2017-05-09 LAB — GLUCOSE, CAPILLARY: Glucose-Capillary: 191 mg/dL — ABNORMAL HIGH (ref 65–99)

## 2017-05-09 MED ORDER — AZILSARTAN-CHLORTHALIDONE 40-25 MG PO TABS: 1.0000 | ORAL_TABLET | Freq: Every day | ORAL | 1 refills | Status: DC

## 2017-05-09 MED ORDER — INSULIN GLARGINE 100 UNIT/ML ~~LOC~~ SOLN: SUBCUTANEOUS | 11 refills | Status: DC

## 2017-05-09 MED ORDER — SITAGLIPTIN PHOSPHATE 50 MG PO TABS: 50.0000 mg | ORAL_TABLET | Freq: Every day | ORAL | 1 refills | Status: DC

## 2017-05-09 MED ORDER — ALBUTEROL SULFATE HFA 108 (90 BASE) MCG/ACT IN AERS: 2.0000 | INHALATION_SPRAY | RESPIRATORY_TRACT | 2 refills | Status: DC | PRN

## 2017-05-09 MED ORDER — FLUTICASONE-SALMETEROL 100-50 MCG/DOSE IN AEPB: 1.0000 | INHALATION_SPRAY | Freq: Two times a day (BID) | RESPIRATORY_TRACT | 2 refills | Status: DC

## 2017-05-09 NOTE — Progress Notes (Signed)
Subjective:    Patient ID: Lori Shea, female    DOB: 04/25/1975, 42 y.o.   MRN: [id number]  HPI Lori Shea, a 42 year old female with a history of uncontrolled type 2 diabetes, hypertension, polycystic ovarian syndrome, and morbid obesity presents for a follow up of type 2 DM. Lori Shea's most recent hemoglobin a1c was 9.5. Patient was started on Lantus 30 units at bedtime and Metformin 500 mg BID. Patient says that fasting blood sugars have been consistently elevated. She says that she has make an attempt to modify diet and has been exercising 3 times per week.  She currently denies blurred vision, dizziness, heart palpitations, shortness of breath, abdominal pain, polyuria, polydipsia, or polyphagia.  She feels well on today and is without complaint.  Patient is complaining of hair loss that has been present for greater than 1 year. She says that previous provider told her that it was fungal. Patient was prescribed a steroid, with unsatisfactory results.   Patient describes symptoms of hair shedding and itching of scalp. Patient does not have family history of hair loss.  Patient does not have dietary restrictions. Patient does not wear a high tension hair style. Patient has not serious medical illnesses or major weight loss during time of hair loss.     Past Medical History:  Diagnosis Date  . Allergy    Foods:  mango, honey, corn.  Allergic to "everything tested"  15 years ago in [place]  . Asthma age 72 yo   Triggers:  cold weather, exercise, allergens, anxiety, URIs  . Cholelithiasis age 60 yo   Asymptomatic  . Diabetes mellitus 2012   Was diagnosed 2 years earlier with PCOS  . Elevated liver enzymes 09/13/2016  . Hypertension 2015  . Kidney stone   . Kidney stones   . Migraines 2009  . Morbid obesity ([place])   . PCOS (polycystic ovarian syndrome) 2010   Review of Systems  Constitutional: Negative for fatigue and fever.  HENT: Negative.   Eyes: Negative.  Negative for  photophobia and visual disturbance.  Respiratory: Negative.   Cardiovascular: Negative.   Gastrointestinal: Negative.   Endocrine: Negative for polydipsia, polyphagia and polyuria.  Genitourinary: Negative.   Musculoskeletal: Negative.   Skin: Negative.   Allergic/Immunologic: Negative for immunocompromised state.  Neurological: Negative.   Hematological: Negative.   Psychiatric/Behavioral: Negative.        Objective:      BP 119/79 (BP Location: Left Arm, Patient Position: Sitting, Cuff Size: Large)   Pulse 88   Temp 98 F (36.7 C) (Oral)   Resp 16   Ht 5\' 6"  (1.676 m)   Wt 267 lb (121.1 kg)   LMP 04/29/2017   SpO2 97%   BMI 43.09 kg/m   Physical Exam  Skin:     Left scalp, non tender, itching, bald, smooth, 1 cm x1 cm  BP 119/79 (BP Location: Left Arm, Patient Position: Sitting, Cuff Size: Large)   Pulse 88   Temp 98 F (36.7 C) (Oral)   Resp 16   Ht 5\' 6"  (1.676 m)   Wt 267 lb (121.1 kg)   LMP 04/29/2017   SpO2 97%   BMI 43.09 kg/m   Assessment & Plan:  Type 2 diabetes mellitus without complication, with long-term current use of insulin (HCC) Hemoglobin A1c is 10.7, which is above goal.  Will increase Lantus to 35 units in am and 25units at bedtime.  Will increase metformin to 500 mg twice daily with meals.  Will also add Januvia 50 mg daily.  Discussed the importance of maintaining carbohydrate modify diet and cardiovascular exercise. - HgB A1c - sitaGLIPtin (JANUVIA) 50 MG tablet; Take 1 tablet (50 mg total) by mouth daily.  Dispense: 90 tablet; Refill: 1 - insulin glargine (LANTUS) 100 UNIT/ML injection; Take 35 units in am and 25 at bedtime  Dispense: 10 mL; Refill: 11  Essential hypertension Blood pressure is at goal on current medication regimen. No medication changes warranted.  - Azilsartan-Chlorthalidone (EDARBYCLOR) 40-25 MG TABS; Take 1 tablet by mouth daily.  Dispense: 90 tablet; Refill: 1  Alopecia of scalp Refrain from pulling hair tightly.    Maintain a clean scalp Patient warrants a referral to dermatology for further workup and evaluation - Ambulatory referral to Dermatology   RTC: 3 months for type 2 DM and hypertension   Donia Pounds  MSN, FNP-C [facility] [address] [place], [place] [address] [phone]

## 2017-05-09 NOTE — Patient Instructions (Signed)
Your hemoglobin A1c is increased to 10.2.  Will change medication regimen to the following: Lantus 35 units in a.m. and 25 units at bedtime, Januvia 50 mg daily with breakfast, and continue metformin 500 mg twice daily with meals.  Also continue a carbohydrate modify diet divided over 3-4 small meals throughout the day and increase water intake.  Start a low impact cardiovascular exercise routine 3-4 times per week   Left scalp alopecia, will send a referral to dermatology for further workup and evaluation.   For essential hypertension will transition to [place] chlor 40-25 mg daily.  Will discontinue Benicar.

## 2017-05-14 ENCOUNTER — Other Ambulatory Visit: Payer: Self-pay

## 2017-05-14 MED ORDER — PEN NEEDLES 32G X 5 MM MISC: 100.0000 | Freq: Two times a day (BID) | 3 refills | Status: DC

## 2017-06-10 ENCOUNTER — Ambulatory Visit: Payer: Medicaid Other | Admitting: Family Medicine

## 2017-06-13 MED FILL — FLUOCINONIDE 0.05 % SOLN: 0.05 | 30 days supply | Qty: 60 | Fill #0

## 2017-06-14 ENCOUNTER — Other Ambulatory Visit: Payer: Self-pay | Admitting: Family Medicine

## 2017-06-14 DIAGNOSIS — Z1231 Encounter for screening mammogram for malignant neoplasm of breast: Secondary | ICD-10-CM

## 2017-06-17 ENCOUNTER — Ambulatory Visit
Admission: RE | Admit: 2017-06-17 | Discharge: 2017-06-17 | Disposition: A | Discharge status: Home / Self Care | Payer: Self-pay | Source: Ambulatory Visit | Attending: Family Medicine | Admitting: Family Medicine

## 2017-06-17 DIAGNOSIS — Z1231 Encounter for screening mammogram for malignant neoplasm of breast: Secondary | ICD-10-CM

## 2017-06-26 ENCOUNTER — Ambulatory Visit: Payer: Self-pay | Attending: Family Medicine

## 2017-06-27 ENCOUNTER — Encounter: Payer: Self-pay | Admitting: *Deleted

## 2017-07-11 ENCOUNTER — Ambulatory Visit (INDEPENDENT_AMBULATORY_CARE_PROVIDER_SITE_OTHER): Payer: Self-pay | Admitting: Family Medicine

## 2017-07-11 VITALS — BP 132/81 | HR 102 | Temp 98.2°F | Resp 16 | Ht 66.0 in | Wt 264.0 lb

## 2017-07-11 DIAGNOSIS — E119 Type 2 diabetes mellitus without complications: Secondary | ICD-10-CM

## 2017-07-11 DIAGNOSIS — R81 Glycosuria: Secondary | ICD-10-CM

## 2017-07-11 DIAGNOSIS — R399 Unspecified symptoms and signs involving the genitourinary system: Secondary | ICD-10-CM

## 2017-07-11 DIAGNOSIS — R300 Dysuria: Secondary | ICD-10-CM

## 2017-07-11 DIAGNOSIS — R350 Frequency of micturition: Secondary | ICD-10-CM

## 2017-07-11 DIAGNOSIS — Z794 Long term (current) use of insulin: Secondary | ICD-10-CM

## 2017-07-11 LAB — POCT URINALYSIS DIP (DEVICE)
BILIRUBIN URINE: NEGATIVE
GLUCOSE, UA: 500 mg/dL — AB
Hgb urine dipstick: NEGATIVE
KETONES UR: NEGATIVE mg/dL
Leukocytes, UA: NEGATIVE
NITRITE: NEGATIVE
PH: 6.5 (ref 5.0–8.0)
PROTEIN: NEGATIVE mg/dL
Specific Gravity, Urine: 1.01 (ref 1.005–1.030)
UROBILINOGEN UA: 0.2 mg/dL (ref 0.0–1.0)

## 2017-07-11 LAB — POCT GLYCOSYLATED HEMOGLOBIN (HGB A1C): Hemoglobin A1C: 11.7

## 2017-07-11 MED ORDER — INSULIN GLARGINE 100 UNIT/ML ~~LOC~~ SOLN: SUBCUTANEOUS | 11 refills | Status: DC

## 2017-07-11 MED ORDER — AMOXICILLIN 500 MG PO CAPS: 500.0000 mg | ORAL_CAPSULE | Freq: Two times a day (BID) | ORAL | 0 refills | Status: AC

## 2017-07-11 NOTE — Progress Notes (Signed)
1/17

## 2017-07-11 NOTE — Patient Instructions (Signed)
For urinary tract symptoms we will treat empirically with amoxicillin 500 mg twice daily for 10 days.  Increase water intake to 6-8 glasses a day.  Also, wipe from front to back and practice good perineal hygiene.  Will follow urine culture and follow-up by phone with any abnormal results.   Urinary Tract Infection, Adult A urinary tract infection (UTI) is an infection of any part of the urinary tract. The urinary tract includes the:  Kidneys.  Ureters.  Bladder.  Urethra.  These organs make, store, and get rid of pee (urine) in the body. Follow these instructions at home:  Take over-the-counter and prescription medicines only as told by your doctor.  If you were prescribed an antibiotic medicine, take it as told by your doctor. Do not stop taking the antibiotic even if you start to feel better.  Avoid the following drinks: ? Alcohol. ? Caffeine. ? Tea. ? Carbonated drinks.  Drink enough fluid to keep your pee clear or pale yellow.  Keep all follow-up visits as told by your doctor. This is important.  Make sure to: ? Empty your bladder often and completely. Do not to hold pee for long periods of time. ? Empty your bladder before and after sex. ? Wipe from front to back after a bowel movement if you are female. Use each tissue one time when you wipe. Contact a doctor if:  You have back pain.  You have a fever.  You feel sick to your stomach (nauseous).  You throw up (vomit).  Your symptoms do not get better after 3 days.  Your symptoms go away and then come back. Get help right away if:  You have very bad back pain.  You have very bad lower belly (abdominal) pain.  You are throwing up and cannot keep down any medicines or water. This information is not intended to replace advice given to you by your health care provider. Make sure you discuss any questions you have with your health care provider. Document Released: 09/26/2007 Document Revised: 09/15/2015  Document Reviewed: 02/28/2015 Elsevier Interactive Patient Education  [employer].

## 2017-07-11 NOTE — Progress Notes (Deleted)
Subjective:    Lori Shea is a 42 y.o. female who complains of {UTI sx:16137} for {0-10:33138} {Time; units w/plural:11}.  Patient also complains of {ros UTI:16138}. Patient denies {ros UTI:16138}.  Patient {does/does not:33181} have a history of recurrent UTI.  Patient {does/does not:33181} have a history of pyelonephritis.Marland Kitchen  +  Review of Systems  Constitutional: Negative.   HENT: Negative.   Eyes: Negative.   Respiratory: Negative.   Cardiovascular: Negative.   Gastrointestinal: Negative.   Genitourinary: Positive for dysuria, frequency and urgency. Negative for hematuria.   Musculoskeletal: Negative.   Skin: Negative.   Endo/Heme/Allergies: Negative.   Psychiatric/Behavioral: Negative.     Objective:    BP 132/81 (BP Location: Left Arm, Patient Position: Sitting, Cuff Size: Large)   Pulse (!) 102   Temp 98.2 F (36.8 C) (Oral)   Resp 16   Ht 5\' 6"  (1.676 m)   Wt 264 lb (119.7 kg)   LMP 06/21/2017   SpO2 97%   BMI 42.61 kg/m  General: {gen appear:16600}  Abdomen: {abd exam:14935::"soft, non-tender, without masses or organomegaly"} {abd. site:5010}  Back: {back exam:16126}  GU: {FS:23953}   Laboratory:  Urine dipstick shows {UA dip:16139}.   Micro exam: {urine micro exam:5114}.    Assessment:    {uti/vaginitis:16142}    Plan: Plan:    1. Medications: {uti antibiotics:16143} 2. Maintain adequate hydration 3. Follow up if symptoms not improving, and prn.

## 2017-07-14 ENCOUNTER — Encounter: Payer: Self-pay | Admitting: Family Medicine

## 2017-07-14 NOTE — Progress Notes (Signed)
Subjective:    Lori Shea is a 42 y.o. female with a history of uncontrolled type 2 diabetes mellitus, hypertension, and PCOS presents complaining  of burning with urination, dysuria and right flank pain for 3 days. Lus has been taking OTC Azo and cranberry tablets without relief.. Patient denies congestion, cough, fever, headache, rhinitis, sorethroat, stomach ache and vaginal discharge.  Patient does not have a history of recurrent UTI.  Patient says that blood sugars have been elevated over the past several weeks. She stopped taking Lantus for  1 week after reading an article stating that when an individuals pancreas produces insulin, there is no need for synthethic insulin. She says that blood sugar levels have been averaging between 180-250.   Past Medical History:  Diagnosis Date  . Allergy    Foods:  mango, honey, corn.  Allergic to "everything tested"  15 years ago in [place]  . Asthma age 42 yo   Triggers:  cold weather, exercise, allergens, anxiety, URIs  . Cholelithiasis age 61 yo   Asymptomatic  . Diabetes mellitus 2012   Was diagnosed 2 years earlier with PCOS  . Elevated liver enzymes 09/13/2016  . Hypertension 2015  . Kidney stone   . Kidney stones   . Migraines 2009  . Morbid obesity ([place])   . PCOS (polycystic ovarian syndrome) 2010   Immunization History  Administered Date(s) Administered  . Pneumococcal-Unspecified 04/23/1997  . Td 09/06/2004  . Tdap 09/13/2016   Social History   Socioeconomic History  . Marital status: Divorced    Spouse name: Not on file  . Number of children: 1  . Years of education: some comm. college  . Highest education level: Not on file  Occupational History  . Occupation: CNA    Comment: Veterinary surgeon  . Financial resource strain: Not on file  . Food insecurity:    Worry: Not on file    Inability: Not on file  . Transportation needs:    Medical: Not on file    Non-medical: Not on file  Tobacco  Use  . Smoking status: Never Smoker  . Smokeless tobacco: Never Used  Substance and Sexual Activity  . Alcohol use: No  . Drug use: No  . Sexual activity: Yes    Birth control/protection: Pill    Comment: Sprintec  Lifestyle  . Physical activity:    Days per week: Not on file    Minutes per session: Not on file  . Stress: Not on file  Relationships  . Social connections:    Talks on phone: Not on file    Gets together: Not on file    Attends religious service: Not on file    Active member of club or organization: Not on file    Attends meetings of clubs or organizations: Not on file    Relationship status: Not on file  . Intimate partner violence:    Fear of current or ex partner: Not on file    Emotionally abused: Not on file    Physically abused: Not on file    Forced sexual activity: Not on file  Other Topics Concern  . Not on file  Social History Narrative   Originally from [place], [place] to [place] in 1988.   In [place] in [place] at home with daughter on [address] [place].   Allergies  Allergen Reactions  . Corn-Containing Products   . Honey Shortness Of Breath  .  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 Victoza [Liraglutide] Nausea And Vomiting   Review of Systems  Constitutional: Negative.   HENT: Negative.   Eyes: Negative.   Cardiovascular: Negative.   Gastrointestinal: Negative.   Genitourinary: Positive for dysuria, flank pain, frequency and urgency. Negative for hematuria.  Skin: Negative.   Neurological: Negative.   Endo/Heme/Allergies: Negative.   Psychiatric/Behavioral: Negative.      Objective:   Physical Exam  Constitutional: Vital signs are normal. She appears well-developed and well-nourished.  Cardiovascular: Normal rate, regular rhythm, normal heart sounds and intact distal pulses.  Pulmonary/Chest: Effort normal and breath sounds normal.  Abdominal: Soft. Bowel sounds are normal. There is CVA tenderness.    Plan  1. Symptoms of urinary tract infection Will treat symptoms of UTI empirically with Amoxicillin 500mg  BID for 10 days.  Patient advised to increase water intake, wipe from front to back, and practice good perineal  hygiene.  - POCT urinalysis dip (device) - Urine Culture - amoxicillin (AMOXIL) 500 MG capsule; Take 1 capsule (500 mg total) by mouth 2 (two) times daily for 10 days.  Dispense: 20 capsule; Refill: 0  2. Urinary frequency - POCT urinalysis dip (device)  3. Dysuria - POCT urinalysis dip (device)  4. Type 2 diabetes mellitus without complication, with long-term current use of insulin (HCC) Hemoglobin a1C is 11.7, which is above goal.  Goal is < 7. Discussed the importance of taking medication consistently in order to achieve positive outcomes.  - POCT urinalysis dip (device) - insulin glargine (LANTUS) 100 UNIT/ML injection; Take 35 units in am and 25 at bedtime  Dispense: 10 mL; Refill: 11  5. Glucosuria - POCT urinalysis dip (device) - HgB A1c   RTC; 3 months for DMII and hypertension  Donia Pounds  MSN, FNP-C [facility] [address] [place], [place] [address] [phone]

## 2017-07-15 ENCOUNTER — Encounter (HOSPITAL_COMMUNITY): Payer: Self-pay

## 2017-07-15 ENCOUNTER — Telehealth: Payer: Self-pay

## 2017-07-15 LAB — URINE CULTURE

## 2017-07-15 NOTE — Telephone Encounter (Signed)
Called and left a message advising patient that urine was growing bacteria and that it is important that she finish the antibiotic therapy as directed. Ask that she keep next scheduled follow up and call if she needs [place] before then and left call back number. Thanks!

## 2017-07-15 NOTE — Telephone Encounter (Signed)
-----   Message from Dorena Dew, [place] sent at 07/15/2017  5:56 AM EDT ----- Regarding: lab results Please inform patient that urine culture yielded E.coli. Please complete prescribed antibiotic therapy. Follow up in office as scheduled.   Donia Pounds  MSN, FNP-C [facility] [address] [place], [place] [address] [phone]

## 2017-07-24 ENCOUNTER — Other Ambulatory Visit: Payer: Self-pay

## 2017-07-24 MED ORDER — FLUTICASONE-SALMETEROL 100-50 MCG/DOSE IN AEPB: 1.0000 | INHALATION_SPRAY | Freq: Two times a day (BID) | RESPIRATORY_TRACT | 2 refills | Status: DC

## 2017-07-29 ENCOUNTER — Ambulatory Visit: Payer: No Typology Code available for payment source | Admitting: Family Medicine

## 2017-07-31 ENCOUNTER — Encounter: Payer: Self-pay | Admitting: Obstetrics and Gynecology

## 2017-07-31 ENCOUNTER — Ambulatory Visit (INDEPENDENT_AMBULATORY_CARE_PROVIDER_SITE_OTHER): Payer: Self-pay | Admitting: Obstetrics and Gynecology

## 2017-07-31 VITALS — BP 129/74 | HR 115 | Ht 66.0 in | Wt 259.7 lb

## 2017-07-31 DIAGNOSIS — R87612 Low grade squamous intraepithelial lesion on cytologic smear of cervix (LGSIL): Secondary | ICD-10-CM

## 2017-07-31 DIAGNOSIS — Z3041 Encounter for surveillance of contraceptive pills: Secondary | ICD-10-CM

## 2017-07-31 DIAGNOSIS — N879 Dysplasia of cervix uteri, unspecified: Secondary | ICD-10-CM | POA: Insufficient documentation

## 2017-07-31 DIAGNOSIS — Z309 Encounter for contraceptive management, unspecified: Secondary | ICD-10-CM

## 2017-07-31 MED ORDER — LEVONORGESTREL 1.5 MG PO TABS: 1.5000 mg | ORAL_TABLET | Freq: Once | ORAL | 0 refills | Status: AC

## 2017-07-31 NOTE — Progress Notes (Signed)
Obstetrics and Gynecology New Patient Evaluation  Appointment Date: 07/31/2017  OBGYN [facility]: [facility]  Primary Care Provider: Dorena Shea  Referring Provider: Department, [facility]*  Chief Complaint:  Contraception management  History of Present Illness: Lori Shea is a 42 y.o. Caucasian [id number] (Patient's last menstrual period was 07/17/2017 (exact date).), seen for the above chief complaint. Her past medical history is significant for PCOS, BMI 40s, HTN, DM2.  Patient has been on sprintec/combined OCPs since birth of her last daughter 18 years ago. Patient referred from [facility] to talk about her contraception given her co-moribidities. Patient states she was given PCOS dx many years and had confirmatory lab work and an u/s at that time.    No breast s/s, chest pain, sob, abdominal pain.   Review of Systems: as noted in the History of Present Illness.  Patient Active Problem List   Diagnosis Date Noted  . Alopecia of scalp 05/09/2017  . Pain in left toe(s) 12/27/2016  . Closed avulsion fracture of lateral malleolus of right fibula 12/27/2016  . Morbid obesity ([place]) 09/13/2016  . Elevated liver enzymes 09/13/2016  . Allergy   . Asthma   . Kidney stones   . Cholelithiasis   . Hypertension 04/23/2013  . Irregular menstrual bleeding 02/22/2011  . Diabetes ([place]) 04/23/2010  . PCOS (polycystic ovarian syndrome) 04/23/2008  . Migraines 04/24/2007  . ASTHMA NOS W/ACUTE EXACERBATION 09/27/2006  . VAGINAL DELIVERY, HX OF 09/27/2006     Past Medical History:  Past Medical History:  Diagnosis Date  . Allergy    Foods:  mango, honey, corn.  Allergic to "everything tested"  15 years ago in [place]  . Asthma age 56 yo   Triggers:  cold weather, exercise, allergens, anxiety, URIs  . Cholelithiasis age 62 yo   Asymptomatic  . Diabetes mellitus 2012   Was diagnosed 2 years earlier with PCOS  . Elevated liver enzymes 09/13/2016  . Hypertension  2015  . Kidney stone   . Kidney stones   . Migraines 2009  . Morbid obesity ([place])   . PCOS (polycystic ovarian syndrome) 2010    Past Surgical History:  No past surgical history on file.  Past Obstetrical History:  OB History  Gravida Para Term Preterm AB Living  3 1 1   2 1   SAB TAB Ectopic Multiple Live Births  2            # Outcome Date GA Lbr Len/2nd Weight Sex Delivery Anes PTL Lv  3 Term           2 SAB           1 SAB             SVD x 1  Past Gynecological History: As per HPI. Periods: qmonth, regular on OCPs. Pt states she had AUB when she wasn't on pills History of Pap Smear(s): Yes.   Last pap 2019, which [facility] She is currently using OCPs for contraception.   Social History:  Social History   Socioeconomic History  . Marital status: Divorced    Spouse name: Not on file  . Number of children: 1  . Years of education: some comm. college  . Highest education level: Not on file  Occupational History  . Occupation: CNA    Comment: Veterinary surgeon  . Financial resource strain: Not on file  . Food insecurity:    Worry: Not on file  Inability: Not on file  . Transportation needs:    Medical: Not on file    Non-medical: Not on file  Tobacco Use  . Smoking status: Never Smoker  . Smokeless tobacco: Never Used  Substance and Sexual Activity  . Alcohol use: No  . Drug use: No  . Sexual activity: Yes    Birth control/protection: Pill    Comment: Sprintec  Lifestyle  . Physical activity:    Days per week: Not on file    Minutes per session: Not on file  . Stress: Not on file  Relationships  . Social connections:    Talks on phone: Not on file    Gets together: Not on file    Attends religious service: Not on file    Active member of club or organization: Not on file    Attends meetings of clubs or organizations: Not on file    Relationship status: Not on file  . Intimate partner violence:    Fear of current or ex  partner: Not on file    Emotionally abused: Not on file    Physically abused: Not on file    Forced sexual activity: Not on file  Other Topics Concern  . Not on file  Social History Narrative   Originally from [place], [place] to [place] in 1988.   In [place] in [place] at home with daughter on [address].    Family History:  Family History  Problem Relation Age of Onset  . Diabetes Father   . Hyperlipidemia Father   . Hypertension Father   . Heart disease Father   . Kidney disease Father        Developed after 2nd CABG  . Diabetes Paternal Grandfather   . Heart disease Paternal Grandfather   . Hyperlipidemia Paternal Grandfather   . Hypertension Paternal Grandfather   . Kidney disease Paternal Grandfather     Health Maintenance:  Mammogram(s): Yes.   Date: 2019  Medications Lori Shea. Lori Shea had no medications administered during this visit. Current Outpatient Medications  Medication Sig Dispense Refill  . albuterol (PROVENTIL HFA;VENTOLIN HFA) 108 (90 Base) MCG/ACT inhaler Inhale 2 puffs into the lungs every 4 (four) hours as needed for wheezing or shortness of breath. 2 each 2  . albuterol (PROVENTIL) (2.5 MG/3ML) 0.083% nebulizer solution Take 3 mLs (2.5 mg total) by nebulization every 6 (six) hours as needed for wheezing or shortness of breath. 75 mL 0  . amitriptyline (ELAVIL) 25 MG tablet Take 1 tablet (25 mg total) by mouth at bedtime. 30 tablet 3  . Azilsartan-Chlorthalidone (EDARBYCLOR) 40-25 MG TABS Take 1 tablet by mouth daily. 90 tablet 1  . Black Elderberry,Berry-Flower, 575 MG CAPS Take 1 capsule by mouth daily.    . cetirizine (ZYRTEC) 10 MG tablet Take 1 tablet (10 mg total) by mouth daily. 30 tablet 11  . Dexlansoprazole (DEXILANT) 30 MG capsule Take 1 capsule (30 mg total) by mouth daily. Take on empty stomach 30 capsule 3  . EASY COMFORT PEN NEEDLES 32G X 4 MM MISC use as directed test 2 times daily  3  .  Fluticasone-Salmeterol (ADVAIR DISKUS) 100-50 MCG/DOSE AEPB Inhale 1 puff into the lungs every 12 (twelve) hours. 60 each 2  . insulin glargine (LANTUS) 100 UNIT/ML injection Take 35 units in am and 25 at bedtime 10 mL 11  . Insulin Pen Needle (PEN NEEDLES) 32G X 5 MM MISC 100 each by Does not apply  route 2 (two) times daily. 100 each 3  . meloxicam (MOBIC) 15 MG tablet Take 15 mg by mouth daily.    . metFORMIN (GLUCOPHAGE) 500 MG tablet Take 1 tablet (500 mg total) by mouth 2 (two) times daily with a meal. 90 tablet 0  . mometasone (NASONEX) 50 MCG/ACT nasal spray Place 2 sprays into the nose daily. 17 g 12  . montelukast (SINGULAIR) 10 MG tablet Take 1 tablet (10 mg total) by mouth daily. 30 tablet 3  . Multiple Vitamins-Minerals (MULTIVITAMIN ADULT PO) Take 1 tablet by mouth daily.    . norgestimate-ethinyl estradiol (ORTHO-CYCLEN,SPRINTEC,PREVIFEM) 0.25-35 MG-MCG tablet Take 1 tablet by mouth daily. 1 Package 3  . rizatriptan (MAXALT-MLT) 10 MG disintegrating tablet Take 1 tablet (10 mg total) by mouth as needed for migraine. May repeat in 2 hours if needed.  Max 30 mg/24 h 12 tablet 6  . sitaGLIPtin (JANUVIA) 50 MG tablet Take 1 tablet (50 mg total) by mouth daily. 90 tablet 1  . levonorgestrel (PLAN B 1-STEP) 1.5 MG tablet Take 1 tablet (1.5 mg total) by mouth once for 1 dose. 2 tablet 0   No current facility-administered medications for this visit.     Allergies Corn-containing products; Honey; Mango flavor; and Victoza [liraglutide]   Physical Exam:  BP 129/74   Pulse (!) 115   Ht 5\' 6"  (1.676 m)   Wt 259 lb 11.2 oz (117.8 kg)   LMP 07/17/2017 (Exact Date)   BMI 41.92 kg/m  Body mass index is 41.92 kg/m. General appearance: Well nourished, well developed female in no acute distress.   Laboratory: none  Radiology: none  Assessment: pt doing well  Plan:  1. Encounter for surveillance of contraceptive pills Long d/w pt. I told her to definitely stop the pills today, as she  has too many contraindications to using estrogen containing options. I told her I recommend LNG IUD 1st and then depo provera or nexplanon next. She is interested in an IUD. I told her I recommend condoms in the meantime and that she needs to have her colpo first. She has her bcccp initial appt in may and then I told her she'll have an appt in this clinic again for a colpo. I told her that if the person doing her colpo is okay doing her LNG IUD at the same time and billing wise we can, then that's okay but I told her that we may have to bring her back for a 2nd visit for the IUD after the colpo.  Plan B prescription sent in.   RTC PRN  Durene Romans MD Attending [facility] [employer] [facility] farm [facility])

## 2017-08-07 ENCOUNTER — Ambulatory Visit: Payer: Medicaid Other | Admitting: Family Medicine

## 2017-08-19 ENCOUNTER — Ambulatory Visit (INDEPENDENT_AMBULATORY_CARE_PROVIDER_SITE_OTHER): Payer: Self-pay | Admitting: Family Medicine

## 2017-08-19 ENCOUNTER — Encounter: Payer: Self-pay | Admitting: Family Medicine

## 2017-08-19 ENCOUNTER — Ambulatory Visit (HOSPITAL_COMMUNITY)
Admission: RE | Admit: 2017-08-19 | Discharge: 2017-08-19 | Disposition: A | Discharge status: Home / Self Care | Payer: No Typology Code available for payment source | Source: Ambulatory Visit | Attending: Family Medicine | Admitting: Family Medicine

## 2017-08-19 VITALS — BP 124/86 | HR 95 | Temp 98.6°F | Resp 16 | Ht 66.0 in | Wt 257.0 lb

## 2017-08-19 DIAGNOSIS — J309 Allergic rhinitis, unspecified: Secondary | ICD-10-CM

## 2017-08-19 DIAGNOSIS — R918 Other nonspecific abnormal finding of lung field: Secondary | ICD-10-CM | POA: Insufficient documentation

## 2017-08-19 DIAGNOSIS — Z3200 Encounter for pregnancy test, result unknown: Secondary | ICD-10-CM

## 2017-08-19 DIAGNOSIS — R0989 Other specified symptoms and signs involving the circulatory and respiratory systems: Secondary | ICD-10-CM

## 2017-08-19 DIAGNOSIS — J4520 Mild intermittent asthma, uncomplicated: Secondary | ICD-10-CM

## 2017-08-19 DIAGNOSIS — IMO0001 Reserved for inherently not codable concepts without codable children: Secondary | ICD-10-CM

## 2017-08-19 LAB — POCT URINE PREGNANCY: Preg Test, Ur: NEGATIVE

## 2017-08-19 MED ORDER — ALBUTEROL SULFATE (2.5 MG/3ML) 0.083% IN NEBU
2.5000 mg | INHALATION_SOLUTION | Freq: Once | RESPIRATORY_TRACT | Status: AC
  Administered 2017-08-19: 2.5 mg via RESPIRATORY_TRACT

## 2017-08-19 MED ORDER — AZITHROMYCIN 250 MG PO TABS: ORAL_TABLET | ORAL | 0 refills | Status: DC

## 2017-08-19 MED ORDER — PREDNISONE 10 MG (21) PO TBPK: ORAL_TABLET | ORAL | 0 refills | Status: DC

## 2017-08-19 MED ORDER — IPRATROPIUM BROMIDE 0.02 % IN SOLN
0.5000 mg | Freq: Once | RESPIRATORY_TRACT | Status: AC
  Administered 2017-08-19: 0.5 mg via RESPIRATORY_TRACT

## 2017-08-19 MED ORDER — ALBUTEROL SULFATE (2.5 MG/3ML) 0.083% IN NEBU: 2.5000 mg | INHALATION_SOLUTION | Freq: Four times a day (QID) | RESPIRATORY_TRACT | 0 refills | Status: DC | PRN

## 2017-08-19 MED FILL — predniSONE 10 MG TABS: 10 | 7 days supply | Qty: 21 | Fill #0

## 2017-08-19 MED FILL — AZITHROMYCIN 250 MG TABLET: 250 | 5 days supply | Qty: 6 | Fill #0

## 2017-08-19 MED FILL — !VENTOLIN HFA INHALER: 108 (90 BAS | 50 days supply | Qty: 18 | Fill #0

## 2017-08-19 MED FILL — MONTELUKAST SOD 10 MG TAB: 10 | 30 days supply | Qty: 30 | Fill #0

## 2017-08-19 MED FILL — MELOXICAM 15 MG TABLET: 15 | 31 days supply | Qty: 31 | Fill #0

## 2017-08-19 NOTE — Progress Notes (Signed)
Subjective:     Lori Shea is an 42 y.o. female with a history of asthma and allergic rhinitis presents for asthma exacerbation.  Patient has been experiencing symptoms over the past 3 to 4 weeks.  She has been consistently using albuterol nebulizer every 6 hours without sustained relief.  She is also taking over-the-counter cetirizine 10 mg and Singulair.  Patient has been taking daily Advair for the past several years.  She endorses nighttime awakenings with wheezing, shortness of breath, and persistent coughing.  She last had albuterol nebulizer on last night with unsatisfactory results.  She denies fever, chest pain, heart palpitations, nausea, vomiting, diarrhea, or constipation. Past Medical History:  Diagnosis Date  . Allergy    Foods:  mango, honey, corn.  Allergic to "everything tested"  15 years ago in [place]  . Asthma age 49 yo   Triggers:  cold weather, exercise, allergens, anxiety, URIs  . Cholelithiasis age 36 yo   Asymptomatic  . Diabetes mellitus 2012   Was diagnosed 2 years earlier with PCOS  . Elevated liver enzymes 09/13/2016  . Hypertension 2015  . Kidney stones   . Migraines 2009  . Morbid obesity ([place])   . PCOS (polycystic ovarian syndrome) 2010   Social History   Socioeconomic History  . Marital status: Divorced    Spouse name: Not on file  . Number of children: 1  . Years of education: some comm. college  . Highest education level: Not on file  Occupational History  . Occupation: CNA    Comment: Veterinary surgeon  . Financial resource strain: Not on file  . Food insecurity:    Worry: Not on file    Inability: Not on file  . Transportation needs:    Medical: Not on file    Non-medical: Not on file  Tobacco Use  . Smoking status: Never Smoker  . Smokeless tobacco: Never Used  Substance and Sexual Activity  . Alcohol use: No  . Drug use: No  . Sexual activity: Yes    Birth control/protection: Pill    Comment: Sprintec    Lifestyle  . Physical activity:    Days per week: Not on file    Minutes per session: Not on file  . Stress: Not on file  Relationships  . Social connections:    Talks on phone: Not on file    Gets together: Not on file    Attends religious service: Not on file    Active member of club or organization: Not on file    Attends meetings of clubs or organizations: Not on file    Relationship status: Not on file  . Intimate partner violence:    Fear of current or ex partner: Not on file    Emotionally abused: Not on file    Physically abused: Not on file    Forced sexual activity: Not on file  Other Topics Concern  . Not on file  Social History Narrative   Originally from [place], [place] to [place] in 1988.   In [place] in [place] at home with daughter on [place].   Allergies  Allergen Reactions  . Corn-Containing Products   . Honey Shortness Of Breath  . 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 Victoza [Liraglutide] Nausea And Vomiting   Allergies as of 08/19/2017      Reactions   Corn-containing Products    Honey  Shortness Of Breath   Mango Flavor    Pt allergic to all mango   Victoza [liraglutide] Nausea And Vomiting      Medication List        Accurate as of 08/19/17 10:55 AM. Always use your most recent med list.          albuterol 108 (90 Base) MCG/ACT inhaler Commonly known as:  PROVENTIL HFA;VENTOLIN HFA Inhale 2 puffs into the lungs every 4 (four) hours as needed for wheezing or shortness of breath.   albuterol (2.5 MG/3ML) 0.083% nebulizer solution Commonly known as:  PROVENTIL Take 3 mLs (2.5 mg total) by nebulization every 6 (six) hours as needed for wheezing or shortness of breath.   amitriptyline 25 MG tablet Commonly known as:  ELAVIL Take 1 tablet (25 mg total) by mouth at bedtime.   Azilsartan-Chlorthalidone 40-25 MG Tabs Commonly known as:  EDARBYCLOR Take 1 tablet by mouth daily.   azithromycin 250 MG  tablet Commonly known as:  ZITHROMAX Take 500 mg on day, 250 mg days 2-5   Black Elderberry(Berry-Flower) 575 MG Caps Take 1 capsule by mouth daily.   cetirizine 10 MG tablet Commonly known as:  ZYRTEC Take 1 tablet (10 mg total) by mouth daily.   Dexlansoprazole 30 MG capsule Commonly known as:  DEXILANT Take 1 capsule (30 mg total) by mouth daily. Take on empty stomach   Fluticasone-Salmeterol 100-50 MCG/DOSE Aepb Commonly known as:  ADVAIR DISKUS Inhale 1 puff into the lungs every 12 (twelve) hours.   insulin glargine 100 UNIT/ML injection Commonly known as:  LANTUS Take 35 units in am and 25 at bedtime   meloxicam 15 MG tablet Commonly known as:  MOBIC Take 15 mg by mouth daily.   metFORMIN 500 MG tablet Commonly known as:  GLUCOPHAGE Take 1 tablet (500 mg total) by mouth 2 (two) times daily with a meal.   mometasone 50 MCG/ACT nasal spray Commonly known as:  NASONEX Place 2 sprays into the nose daily.   montelukast 10 MG tablet Commonly known as:  SINGULAIR Take 1 tablet (10 mg total) by mouth daily.   MULTIVITAMIN ADULT PO Take 1 tablet by mouth daily.   norgestimate-ethinyl estradiol 0.25-35 MG-MCG tablet Commonly known as:  ORTHO-CYCLEN,SPRINTEC,PREVIFEM Take 1 tablet by mouth daily.   Pen Needles 32G X 5 MM Misc 100 each by Does not apply route 2 (two) times daily.   EASY COMFORT PEN NEEDLES 32G X 4 MM Misc Generic drug:  Insulin Pen Needle use as directed test 2 times daily   predniSONE 10 MG (21) Tbpk tablet Commonly known as:  STERAPRED UNI-PAK 21 TAB Per written instructions for 6 days tapered   rizatriptan 10 MG disintegrating tablet Commonly known as:  MAXALT-MLT Take 1 tablet (10 mg total) by mouth as needed for migraine. May repeat in 2 hours if needed.  Max 30 mg/24 h   sitaGLIPtin 50 MG tablet Commonly known as:  JANUVIA Take 1 tablet (50 mg total) by mouth daily.      Review of Systems  Constitutional: Positive for  malaise/fatigue.  HENT: Positive for congestion and sinus pain.   Eyes: Negative.   Respiratory: Positive for sputum production and wheezing.   Cardiovascular: Negative for palpitations.  Gastrointestinal: Negative.   Genitourinary: Negative.   Musculoskeletal: Negative.   Skin: Negative.   Neurological: Negative.   Endo/Heme/Allergies: Negative.   Psychiatric/Behavioral: Negative.     Objective:  Physical Exam  Constitutional: She is oriented to person, place, and  time.  Pulmonary/Chest: Effort normal and breath sounds normal.  Abdominal: Soft. Bowel sounds are normal.  Musculoskeletal: Normal range of motion.  Neurological: She is alert and oriented to person, place, and time.  Skin: Skin is warm and dry.    Assessment:  BP 124/86 (BP Location: Right Arm, Patient Position: Sitting, Cuff Size: Large)   Pulse 95   Temp 98.6 F (37 C) (Oral)   Resp 16   Ht 5\' 6"  (1.676 m)   Wt 257 lb (116.6 kg)   LMP 07/17/2017   SpO2 96%   BMI 41.48 kg/m   Plan:  1. Moderate intermittent asthma without complication For asthma exacerbation, patient received duo nebulizer in office without complication.  We will follow-up by phone with chest x-ray as results become available.  We will also start a trial of prednisone per taper dose pack for 6 days. - predniSONE (STERAPRED UNI-PAK 21 TAB) 10 MG (21) TBPK tablet; Per written instructions for 6 days tapered  Dispense: 21 tablet; Refill: 0 - DG Chest 2 View; Future - albuterol (PROVENTIL) (2.5 MG/3ML) 0.083% nebulizer solution; Take 3 mLs (2.5 mg total) by nebulization every 6 (six) hours as needed for wheezing or shortness of breath.  Dispense: 75 mL; Refill: 0 - albuterol (PROVENTIL) (2.5 MG/3ML) 0.083% nebulizer solution 2.5 mg - ipratropium (ATROVENT) nebulizer solution 0.5 mg  2. Symptoms of upper respiratory infection (URI) Due to length of symptom occurrence, will start a trial of azithromycin.  Patient will take 500 mg on day 1 and 250  mg on days 2 through 5.  - azithromycin (ZITHROMAX) 250 MG tablet; Take 500 mg on day, 250 mg days 2-5  Dispense: 6 tablet; Refill: 0 - albuterol (PROVENTIL) (2.5 MG/3ML) 0.083% nebulizer solution 2.5 mg - ipratropium (ATROVENT) nebulizer solution 0.5 mg  3. Allergic rhinitis, unspecified seasonality, unspecified trigger - albuterol (PROVENTIL) (2.5 MG/3ML) 0.083% nebulizer solution 2.5 mg - ipratropium (ATROVENT) nebulizer solution 0.5 mg  4. Encounter for pregnancy test, result unknown - POCT urine pregnancy  Patient was given clear instructions to report to the emergency department if asthma exacerbation worsens.   RTC: As previously scheduled  Donia Pounds  MSN, FNP-C [facility] [address] [place], [place] [address] [phone]

## 2017-08-24 NOTE — Patient Instructions (Signed)

## 2017-08-26 MED FILL — metFORMIN HCL 1000 MG TABS: 1000 | 30 days supply | Qty: 60 | Fill #0

## 2017-09-10 ENCOUNTER — Encounter (HOSPITAL_COMMUNITY): Payer: Self-pay

## 2017-09-10 ENCOUNTER — Ambulatory Visit (HOSPITAL_COMMUNITY)
Admission: RE | Admit: 2017-09-10 | Discharge: 2017-09-10 | Disposition: A | Discharge status: Home / Self Care | Payer: No Typology Code available for payment source | Source: Ambulatory Visit | Attending: Obstetrics and Gynecology | Admitting: Obstetrics and Gynecology

## 2017-09-10 VITALS — BP 118/78 | Ht 66.0 in

## 2017-09-10 DIAGNOSIS — Z1239 Encounter for other screening for malignant neoplasm of breast: Secondary | ICD-10-CM

## 2017-09-10 DIAGNOSIS — R87612 Low grade squamous intraepithelial lesion on cytologic smear of cervix (LGSIL): Secondary | ICD-10-CM

## 2017-09-10 NOTE — Progress Notes (Signed)
Patient referred to [place] by the [facility] Department due to having an abnormal Pap smear on 06/19/2017 that a colposcopy is recommended for follow-up.  Pap Smear: Pap smear not completed today. Last Pap smear was 06/19/2017 at the [facility] Department and [facility]. Referred patient to the Center for [place] at [facility] for a colposcopy to follow-up for abnormal Pap smear. Appointment scheduled for Thursday, September 26, 2017 at 1355. Per patient her most recent Pap smear is the only abnormal Pap smear she has had. Last three Pap smear results are in Epic.  Physical exam: Breasts Breasts symmetrical. No skin abnormalities bilateral breasts. No nipple retraction bilateral breasts. No nipple discharge bilateral breasts. No lymphadenopathy. No lumps palpated bilateral breasts. No complaints of pain or tenderness on exam. Screening mammogram due in February 2020 due to last screening mammogram was completed 06/17/2017.     Pelvic/Bimanual No Pap smear completed today since last Pap smear was 06/19/2017. Pap smear not indicated per BCCCP guidelines.   Smoking History: Patient has never smoked.  Patient Navigation: Patient education provided. Access to services provided for patient through BCCCP program.   Breast and Cervical Cancer Risk Assessment: Patient has no family history of breast cancer, known genetic mutations, or radiation treatment to the chest before age 60. Patient has no history of cervical dysplasia, immunocompromised, or DES exposure in-utero. Patient has a 5-Year breast cancer risk at 0.7% and a Lifetime risk at 9.6%.

## 2017-09-10 NOTE — Patient Instructions (Signed)
Explained breast self awareness with Lori Shea. Patient did not need a Pap smear today due to last Pap smear was 06/19/2017. Explained the colposcopy the recommended follow-up for her abnormal Pap smear with patient. Referred patient to the [facility] for [place] at [facility] for a colposcopy to follow-up for abnormal Pap smear. Appointment scheduled for Thursday, September 26, 2017 at 1355. Patient aware of appointment and will be there. Let patient know her screening mammogram is due in February 2020. Lori Shea verbalized understanding.  [place], Arvil Chaco, RN @T @ 2:42 PM

## 2017-09-17 ENCOUNTER — Other Ambulatory Visit: Payer: Self-pay

## 2017-09-17 MED ORDER — RIZATRIPTAN BENZOATE 10 MG PO TBDP: 10.0000 mg | ORAL_TABLET | ORAL | 6 refills | Status: DC | PRN

## 2017-09-18 MED FILL — MONTELUKAST SOD 10 MG TAB: 10 | 30 days supply | Qty: 30 | Fill #1

## 2017-09-19 ENCOUNTER — Telehealth: Payer: Self-pay

## 2017-09-26 ENCOUNTER — Encounter: Payer: Self-pay | Admitting: Obstetrics and Gynecology

## 2017-09-26 ENCOUNTER — Ambulatory Visit (INDEPENDENT_AMBULATORY_CARE_PROVIDER_SITE_OTHER): Payer: Self-pay | Admitting: Obstetrics and Gynecology

## 2017-09-26 ENCOUNTER — Other Ambulatory Visit (HOSPITAL_COMMUNITY)
Admission: RE | Admit: 2017-09-26 | Discharge: 2017-09-26 | Disposition: A | Discharge status: Home / Self Care | Payer: No Typology Code available for payment source | Source: Ambulatory Visit | Attending: Obstetrics and Gynecology | Admitting: Obstetrics and Gynecology

## 2017-09-26 VITALS — BP 119/79 | HR 101 | Wt 252.0 lb

## 2017-09-26 DIAGNOSIS — R87612 Low grade squamous intraepithelial lesion on cytologic smear of cervix (LGSIL): Secondary | ICD-10-CM | POA: Insufficient documentation

## 2017-09-26 LAB — POCT PREGNANCY, URINE: Preg Test, Ur: NEGATIVE

## 2017-09-26 NOTE — Procedures (Signed)
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   The risks (including infection, bleeding, pain) and benefits of the procedure were explained to the patient and written informed consent was obtained.  The patient was placed in the dorsal lithotomy position. A Graves was speculum inserted in the vagina, and the cervix was visualized.  AA staining done Lugol's with green filter.  Biopsy 4 o'clock and then single toothed tenaculum applied and ECC in all four quadrants done. No bleeding after procedure  Findings: AWE staining circumferentially with mosaicism at 4 and 10 o'clock.   Adequate: Yes  Specimens: 4 o'clock and ECC  Condition: Stable  Complications: None  Plan: The patient was advised to call for any fever or for prolonged or severe pain or bleeding. She was advised to use OTC analgesics as needed for mild to moderate pain. She was advised to avoid vaginal intercourse for 48 hours or until the bleeding has completely stopped.  Hopefully results back in time before patient comes in next week for LNG IUD placement  Durene Romans MD Attending Center for [place] (Faculty Practice)       4 o'clock and ECC

## 2017-09-30 ENCOUNTER — Telehealth: Payer: Self-pay | Admitting: Obstetrics and Gynecology

## 2017-09-30 ENCOUNTER — Encounter (HOSPITAL_COMMUNITY): Payer: Self-pay | Admitting: *Deleted

## 2017-09-30 NOTE — Telephone Encounter (Signed)
GYN Telephone Note I d/w Dr. Tresa Moore and she states there was no endocervical tissue seen on the ECC.  Patient called at [phone] and no answer. VM identified it as "Lori Shea." VM left stating that I recommend doing another ECC at the time of her IUD placement since only endometrial tissue was seen on the ECC. I stated that if she has any questions to let us know and we can talk more about it at her IUD placement if she'd like.   Durene Romans MD Attending Center for [facility] [employer] [facility] (Faculty Practice) 09/30/2017 Time: [phone]

## 2017-10-01 ENCOUNTER — Telehealth: Payer: Self-pay | Admitting: General Practice

## 2017-10-01 NOTE — Telephone Encounter (Signed)
Patient called and left message on nurse voicemail line stating she recently had a colposcopy with Dr Ilda Basset. Patient states she is in a lot of pain and having cramping with some bleeding. Patient states she has an appt tomorrow for IUD placement but thinks maybe given this she should reschedule. Called patient, no answer- left message stating we are trying to reach you to return your phone call, please call us back. Per chart review, Dr Ilda Basset tried to contact the patient yesterday.

## 2017-10-02 ENCOUNTER — Ambulatory Visit: Payer: No Typology Code available for payment source | Admitting: Obstetrics and Gynecology

## 2017-10-02 NOTE — Telephone Encounter (Signed)
Per chart review, patient cancelled appt. Called patient back and she states she had her period Wednesday-Saturday but was continuing to bleed and having cramps/pressure as well as a yellow discharge. Discussed with patient the discharge is normal because of a medication used during the procedure. Discussed the procedure likely had an effect on her period which is why she is still cramping. Discussed telephone note from Dr Ilda Basset with patient and discussed we want to get her appt rescheduled. Offered 6/14 @ 815 appt to patient. Patient verbalized understanding & had no questions.

## 2017-10-04 ENCOUNTER — Other Ambulatory Visit (HOSPITAL_COMMUNITY)
Admission: RE | Admit: 2017-10-04 | Discharge: 2017-10-04 | Disposition: A | Discharge status: Home / Self Care | Payer: No Typology Code available for payment source | Source: Ambulatory Visit | Attending: Obstetrics and Gynecology | Admitting: Obstetrics and Gynecology

## 2017-10-04 ENCOUNTER — Ambulatory Visit (INDEPENDENT_AMBULATORY_CARE_PROVIDER_SITE_OTHER): Payer: Self-pay | Admitting: Obstetrics and Gynecology

## 2017-10-04 ENCOUNTER — Encounter: Payer: Self-pay | Admitting: Obstetrics and Gynecology

## 2017-10-04 VITALS — BP 110/67 | HR 84 | Ht 66.0 in | Wt 258.4 lb

## 2017-10-04 DIAGNOSIS — R87612 Low grade squamous intraepithelial lesion on cytologic smear of cervix (LGSIL): Secondary | ICD-10-CM

## 2017-10-04 NOTE — Progress Notes (Signed)
See procedure note for repeat ecc

## 2017-10-04 NOTE — Patient Instructions (Signed)
How to Use a Condom Correctly, Adult Using a condom correctly and consistently is important for preventing pregnancy and the spread of sexually transmitted diseases (STDs). Condoms work by blocking contact with bodily fluids that can result in pregnancy or spread infection. This is called the barrier method. What are the different types of condoms? There are both female and female condoms. A female condom is a thin sheath that fits over an erect penis. Female condoms can be made from different materials, including:  Latex.  Polyurethane. This is a type of plastic.  Synthetic rubber.  Lambskin or other natural membranes.  Female condoms can be lubricated or unlubricated. A female condom is a thin pouch inserted into the vagina. An inner ring holds the condom in place. Another ring covers the outer folds of skin (labia). Female condoms are made from a rubber-like substance (nitrile). What do condoms prevent? Condoms can effectively prevent:  Pregnancy.  STDs that are transmitted through genital fluids. These include: ? HIV and AIDS. ? Gonorrhea. ? Chlamydia. ? Hepatitis B and C.  Condoms also offer some protection from STDs that are transmitted through skin-to-skin contact if the infected area is covered by the condom. These infections include:  Syphilis.  Genital herpes.  Human papillomavirus (HPV).  Trichomoniasis.  Condoms made from lambskin or other natural membranes are not as effective at preventing STDs because some germs can pass through them. How do I use a condom? Female condom  Store condoms in a cool, dry place.  Before using a condom: ? Check the package to make sure the expiration date has not passed. ? Make sure that both the package and the condom do not have any holes, rips, or tears in them. ? Make sure that the condom is not brittle or discolored.  Make sure the condom is ready to be put on the right way. It should be ready to unroll downward.  Place the condom  over your erect penis before engaging in any contact with your partner's mouth, anus, or vagina.  Pinch the tip of the condomwhile rolling it down to the base of the penis so that the entire penis is covered.  You can use water-based or silicone-based lubricants with female condoms. Do not use oil-based lubricants.  After you ejaculate, hold the rim of the condom as you withdraw.  Carefully pull off the condom away from your partner and be careful to avoid spills.  Wrap the condom in tissue or toilet paper and discard it in a trash can. Do not flush it down the toilet.  Do not use the same condom more than once. Use a new condom every time you have sex.  Female condom  Store condoms in a cool, dry place.  Before using a condom: ? Check the package to make sure the expiration date has not passed. ? Make sure that both the package and the condom do not have any holes, rips, or tears in them. ? Make sure that the condom is not brittle or discolored.  Place the condom inside your vagina before engaging in any contact with your partner's penis. To do this: ? Squeeze the inner ring and insert it into the vagina like a tampon. ? Use your index finger to push it into place. There should be about one inch of condom outside of the vagina to expand during sex. ? Make sure the outer part of the condom completely covers the labia.  Female condoms are already lubricated. You can also use  water-based or silicone-based lubricants with female condoms. Do not use oil-based lubricants.  Your partner should withdraw his penis shortly after ejaculating. Before you stand up, grasp the condom. Twist the outer part slightly to hold in fluid and carefully remove it.  Your partner can also grasp the condom and remove it at the same time he withdraws his penis.  Wrap the condom in tissue or toilet paper and discard it in a trash can. Do not flush it down the toilet.  Do not use the same condom more than  once. Use a new condom every time you have sex.  Where can I get more information? Learn more about how to use a condom correctly from:  Centers for Disease Control and Prevention: ExpensiveJewels.hu  http://www.dixon-collier.info/: LendingParty.com.au  Planned Parenthood: https://www.plannedparenthood.org/learn/birth-control/condom/how-to-put-a-condom-on  This information is not intended to replace advice given to you by your health care provider. Make sure you discuss any questions you have with your health care provider. Document Released: 05/06/2015 Document Revised: 09/15/2015 Document Reviewed: 12/21/2015 Elsevier Interactive Patient Education  2018 [employer].

## 2017-10-04 NOTE — Procedures (Signed)
Colposcopy Procedure Note  Pre-operative Diagnosis: inadequate ECC on prior colposcopy.   Post-operative Diagnosis: s/p ECC  Procedure Details  Patient denies any intercourse since last visit.    The risks (including infection, bleeding, pain) and benefits of the procedure were explained to the patient and written informed consent was obtained.  The patient was placed in the dorsal lithotomy position. A Graves was speculum inserted in the vagina, and the cervix was visualized and the biopsy was c/d/i and healing well with no bleeding. Then single toothed tenaculum applied and ECC in all four quadrants done. No bleeding after procedure  Findings: see above  Specimens: ECC  Condition: Stable  Complications: None  Plan: The patient was advised to call for any fever or for prolonged or severe pain or bleeding. She was advised to use OTC analgesics as needed for mild to moderate pain. She was advised to avoid vaginal intercourse for 48 hours or until the bleeding has completely stopped.  Patient states that since coming off of OCPS that her periods haven't been bad like before and she is having regular monthly periods with no intermenstrual bleeding and she'd like to hold off on the LNG IUD, which I told her is fine. I did tell her that she isn't on anything for birth control so be aware of that.    Durene Romans MD Attending Center for [employer] farm manager)

## 2017-10-08 ENCOUNTER — Telehealth: Payer: Self-pay

## 2017-10-08 NOTE — Telephone Encounter (Signed)
Pt called wanting results of biopsy.

## 2017-10-10 NOTE — Telephone Encounter (Signed)
Pt left additional message on 6/19 @ 1814 requesting results of Colpo.

## 2017-10-14 ENCOUNTER — Encounter: Payer: Self-pay | Admitting: Obstetrics and Gynecology

## 2017-10-22 ENCOUNTER — Other Ambulatory Visit: Payer: Self-pay

## 2017-10-22 MED ORDER — FLUTICASONE-SALMETEROL 100-50 MCG/DOSE IN AEPB: 1.0000 | INHALATION_SPRAY | Freq: Two times a day (BID) | RESPIRATORY_TRACT | 2 refills | Status: DC

## 2017-10-25 ENCOUNTER — Telehealth: Payer: Self-pay

## 2017-10-25 NOTE — Telephone Encounter (Signed)
Called Pt to provide ECC results no answer, so left VM to call out office back.

## 2017-10-29 MED FILL — MONTELUKAST SOD 10 MG TAB: 10 | 30 days supply | Qty: 30 | Fill #2

## 2017-10-29 MED FILL — ?METFORMIN HCL 1,000 MG TAB: 1000 | 30 days supply | Qty: 60 | Fill #1

## 2017-11-14 ENCOUNTER — Encounter: Payer: Self-pay | Admitting: Family Medicine

## 2017-11-22 ENCOUNTER — Ambulatory Visit (INDEPENDENT_AMBULATORY_CARE_PROVIDER_SITE_OTHER): Payer: Self-pay | Admitting: Family Medicine

## 2017-11-22 ENCOUNTER — Ambulatory Visit (HOSPITAL_COMMUNITY)
Admission: RE | Admit: 2017-11-22 | Discharge: 2017-11-22 | Disposition: A | Discharge status: Home / Self Care | Payer: Medicaid Other | Source: Ambulatory Visit | Attending: Family Medicine | Admitting: Family Medicine

## 2017-11-22 ENCOUNTER — Encounter: Payer: Self-pay | Admitting: Family Medicine

## 2017-11-22 VITALS — BP 123/86 | HR 99 | Temp 98.7°F | Resp 16 | Ht 66.0 in | Wt 257.0 lb

## 2017-11-22 DIAGNOSIS — Z91018 Allergy to other foods: Secondary | ICD-10-CM | POA: Insufficient documentation

## 2017-11-22 DIAGNOSIS — R1011 Right upper quadrant pain: Secondary | ICD-10-CM | POA: Insufficient documentation

## 2017-11-22 DIAGNOSIS — R1013 Epigastric pain: Secondary | ICD-10-CM | POA: Insufficient documentation

## 2017-11-22 DIAGNOSIS — Z888 Allergy status to other drugs, medicaments and biological substances status: Secondary | ICD-10-CM | POA: Insufficient documentation

## 2017-11-22 DIAGNOSIS — J4530 Mild persistent asthma, uncomplicated: Secondary | ICD-10-CM

## 2017-11-22 DIAGNOSIS — G4700 Insomnia, unspecified: Secondary | ICD-10-CM

## 2017-11-22 DIAGNOSIS — K219 Gastro-esophageal reflux disease without esophagitis: Secondary | ICD-10-CM

## 2017-11-22 DIAGNOSIS — Z79899 Other long term (current) drug therapy: Secondary | ICD-10-CM | POA: Insufficient documentation

## 2017-11-22 DIAGNOSIS — Z794 Long term (current) use of insulin: Secondary | ICD-10-CM

## 2017-11-22 DIAGNOSIS — K8020 Calculus of gallbladder without cholecystitis without obstruction: Secondary | ICD-10-CM | POA: Insufficient documentation

## 2017-11-22 DIAGNOSIS — I10 Essential (primary) hypertension: Secondary | ICD-10-CM

## 2017-11-22 DIAGNOSIS — E119 Type 2 diabetes mellitus without complications: Secondary | ICD-10-CM | POA: Insufficient documentation

## 2017-11-22 DIAGNOSIS — E1165 Type 2 diabetes mellitus with hyperglycemia: Secondary | ICD-10-CM | POA: Insufficient documentation

## 2017-11-22 DIAGNOSIS — Z09 Encounter for follow-up examination after completed treatment for conditions other than malignant neoplasm: Secondary | ICD-10-CM | POA: Insufficient documentation

## 2017-11-22 DIAGNOSIS — R112 Nausea with vomiting, unspecified: Secondary | ICD-10-CM | POA: Insufficient documentation

## 2017-11-22 LAB — POCT URINALYSIS DIPSTICK
Bilirubin, UA: NEGATIVE
Blood, UA: NEGATIVE
Glucose, UA: POSITIVE — AB
Ketones, UA: 15
Nitrite, UA: NEGATIVE
Protein, UA: NEGATIVE
Spec Grav, UA: 1.01 (ref 1.010–1.025)
Urobilinogen, UA: 0.2 E.U./dL
pH, UA: 5 (ref 5.0–8.0)

## 2017-11-22 LAB — GLUCOSE, POCT (MANUAL RESULT ENTRY): POC Glucose: 448 mg/dl — AB (ref 70–99)

## 2017-11-22 LAB — POCT GLYCOSYLATED HEMOGLOBIN (HGB A1C): Hemoglobin A1C: 13.2 % — AB (ref 4.0–5.6)

## 2017-11-22 LAB — CBG MONITORING, ED: Glucose-Capillary: 390 mg/dL — ABNORMAL HIGH (ref 70–99)

## 2017-11-22 MED ORDER — METFORMIN HCL 500 MG PO TABS: 500.0000 mg | ORAL_TABLET | Freq: Two times a day (BID) | ORAL | 5 refills | Status: DC

## 2017-11-22 MED ORDER — AMITRIPTYLINE HCL 25 MG PO TABS: 25.0000 mg | ORAL_TABLET | Freq: Every day | ORAL | 5 refills | Status: DC

## 2017-11-22 MED ORDER — INSULIN LISPRO 100 UNIT/ML ~~LOC~~ SOLN
3.0000 [IU] | Freq: Once | SUBCUTANEOUS | Status: AC
  Administered 2017-11-22: 3 [IU] via SUBCUTANEOUS

## 2017-11-22 MED ORDER — FLUTICASONE-SALMETEROL 100-50 MCG/DOSE IN AEPB: 1.0000 | INHALATION_SPRAY | Freq: Two times a day (BID) | RESPIRATORY_TRACT | 2 refills | Status: DC

## 2017-11-22 MED ORDER — SODIUM CHLORIDE 0.9 % IV BOLUS
1000.0000 mL | Freq: Once | INTRAVENOUS | Status: AC
  Administered 2017-11-22: 1000 mL via INTRAVENOUS

## 2017-11-22 MED ORDER — SITAGLIPTIN PHOSPHATE 100 MG PO TABS: 100.0000 mg | ORAL_TABLET | Freq: Every day | ORAL | 5 refills | Status: DC

## 2017-11-22 MED ORDER — MONTELUKAST SODIUM 10 MG PO TABS: 10.0000 mg | ORAL_TABLET | Freq: Every day | ORAL | 5 refills | Status: DC

## 2017-11-22 MED ORDER — INSULIN GLARGINE 100 UNIT/ML ~~LOC~~ SOLN: SUBCUTANEOUS | 11 refills | Status: DC

## 2017-11-22 MED ORDER — DEXLANSOPRAZOLE 30 MG PO CPDR: 30.0000 mg | DELAYED_RELEASE_CAPSULE | Freq: Every day | ORAL | 5 refills | Status: DC

## 2017-11-22 MED FILL — ?AMITRIPTYLINE HCL 25 MG TA: 25 | 30 days supply | Qty: 30 | Fill #0

## 2017-11-22 MED FILL — !JANUVIA 100MG TABLET: 100 | 30 days supply | Qty: 30 | Fill #0

## 2017-11-22 NOTE — Progress Notes (Signed)
SUBJECTIVE:  Patient here for follow-up of hypertension and diabetes.  She is not exercising and is not adherent to a low-salt, ADA, or carbohydrate modified eating lifestyle .  Blood pressure is well controlled at home.   She is compliant with medications.  Home blood sugar records: Elevated and "whacky"  Diabetic Labs Latest Ref Rng & Units 11/22/2017 07/11/2017 05/09/2017  HbA1c 4.0 - 5.6 % 13.2(A) 11.7 10.2  Microalbumin 0.00 - 1.89 mg/dL - - -  Chol 100 - 199 mg/dL - - -  HDL >39 mg/dL - - -  Calc LDL 0 - 99 mg/dL - - -  Triglycerides 0 - 149 mg/dL - - -  Creatinine 0.57 - 1.00 mg/dL - - -   No flowsheet data found.  Lab Results  Component Value Date   POCGLU 448 (A) 11/22/2017    Diabetes Health Maintenance Due  Topic Date Due  . FOOT EXAM  06/03/1984  . OPHTHALMOLOGY EXAM  06/03/1984  . HEMOGLOBIN A1C  01/11/2018   Pneumococcal Immunization Status  Topic Date Due  . PNEUMOCOCCAL POLYSACCHARIDE VACCINE (2) 04/23/2002   Past Medical History:  Diagnosis Date  . Allergy    Foods:  mango, honey, corn.  Allergic to "everything tested"  15 years ago in [place]  . Asthma age 87 yo   Triggers:  cold weather, exercise, allergens, anxiety, URIs  . Cholelithiasis age 24 yo   Asymptomatic  . Diabetes mellitus 2012   Was diagnosed 2 years earlier with PCOS  . Elevated liver enzymes 09/13/2016  . Hypertension 2015  . Kidney stones   . Migraines 2009  . Morbid obesity ([place])   . PCOS (polycystic ovarian syndrome) 2010    Social History   Socioeconomic History  . Marital status: Divorced    Spouse name: Not on file  . Number of children: 1  . Years of education: some comm. college  . Highest education level: Not on file  Occupational History  . Occupation: CNA    Comment: Veterinary surgeon  . Financial resource strain: Not on file  . Food insecurity:    Worry: Not on file    Inability: Not on file  . Transportation needs:    Medical: Not on file     Non-medical: Not on file  Tobacco Use  . Smoking status: Never Smoker  . Smokeless tobacco: Never Used  Substance and Sexual Activity  . Alcohol use: No  . Drug use: No  . Sexual activity: Yes    Birth control/protection: None    Comment: Sprintec  Lifestyle  . Physical activity:    Days per week: Not on file    Minutes per session: Not on file  . Stress: Not on file  Relationships  . Social connections:    Talks on phone: Not on file    Gets together: Not on file    Attends religious service: Not on file    Active member of club or organization: Not on file    Attends meetings of clubs or organizations: Not on file    Relationship status: Not on file  . Intimate partner violence:    Fear of current or ex partner: Not on file    Emotionally abused: Not on file    Physically abused: Not on file    Forced sexual activity: Not on file  Other Topics Concern  . Not on file  Social History Narrative   Originally from [place], [place]  to [place] in 1988.   In [place] in [place] at home with daughter on [address].    Allergies  Allergen Reactions  . Corn-Containing Products   . Honey Shortness Of Breath  . 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 Victoza [Liraglutide] Nausea And Vomiting     Review of Systems  Constitutional: Negative.   HENT: Negative.   Eyes: Negative.   Respiratory: Negative.   Cardiovascular: Negative.   Gastrointestinal: Positive for nausea.  Musculoskeletal: Negative.   Neurological: Negative.   Psychiatric/Behavioral: Negative.     OBJECTIVE:   BP 123/86 (BP Location: Right Arm, Patient Position: Sitting, Cuff Size: Large)   Pulse 99   Temp 98.7 F (37.1 C) (Oral)   Resp 16   Ht 5\' 6"  (1.676 m)   Wt 257 lb (116.6 kg)   LMP 10/29/2017   SpO2 98%   BMI 41.48 kg/m    Physical Exam  Constitutional: She is oriented to person, place, and time and well-developed, well-nourished, and in no distress. No  distress.  HENT:  Head: Normocephalic and atraumatic.  Eyes: Pupils are equal, round, and reactive to light. Conjunctivae and EOM are normal.  Neck: Normal range of motion. Neck supple.  Cardiovascular: Normal rate, regular rhythm and intact distal pulses. Exam reveals no gallop and no friction rub.  No murmur heard. Pulmonary/Chest: Effort normal and breath sounds normal. No respiratory distress. She has no wheezes.  Abdominal: Soft. Bowel sounds are normal. There is no tenderness.  Musculoskeletal: Normal range of motion. She exhibits no edema or tenderness.  Lymphadenopathy:    She has no cervical adenopathy.  Neurological: She is alert and oriented to person, place, and time. Gait normal.  Skin: Skin is warm and dry.  Psychiatric: Mood, memory, affect and judgment normal.  Nursing note and vitals reviewed.    ASSESSMENT/ PLAN:   1. Essential hypertension Continue with current medications - Urinalysis Dipstick  2. Type 2 diabetes mellitus without complication, with long-term current use of insulin (HCC) 3 units of immediate release humalog given in office. Patient transferred to day hospital for a liter of fluid due to protenuria and ketonuria. Labs ordered.  Increased Januvia to 100mg . Continue with current medications.  We discussed carb modified diet and increasing exercise.  - Glucose (CBG) - HgB A1c   The patient was given clear instructions to go to ER or return to medical center if symptoms do not improve, worsen or new problems develop. The patient verbalized understanding and agreed with plan of care.   Ms. Doug Sou. Nathaneil Canary, FNP-BC [facility] [address] [place], [place] [address] [phone]

## 2017-11-22 NOTE — Discharge Instructions (Signed)
Hyperglycemia Hyperglycemia is when the sugar (glucose) level in your blood is too high. It may not cause symptoms. If you do have symptoms, they may include warning signs, such as:  Feeling more thirsty than normal.  Hunger.  Feeling tired.  Needing to pee (urinate) more than normal.  Blurry eyesight (vision). You may get other symptoms as it gets worse, such as:  Dry mouth.  Not being hungry (loss of appetite).  Fruity-smelling breath.  Weakness.  Weight gain or loss that is not planned. Weight loss may be fast.  A tingling or numb feeling in your hands or feet.  Headache.  Skin that does not bounce back quickly when it is lightly pinched and released (poor skin turgor).  Pain in your belly (abdomen).  Cuts or bruises that heal slowly. High blood sugar can happen to people who do or do not have diabetes. High blood sugar can happen slowly or quickly, and it can be an emergency. Follow these instructions at home: General instructions  Take over-the-counter and prescription medicines only as told by your doctor.  Do not use products that contain nicotine or tobacco, such as cigarettes and e-cigarettes. If you need help quitting, ask your doctor.  Limit alcohol intake to no more than 1 drink per day for nonpregnant women and 2 drinks per day for men. One drink equals 12 oz of beer, 5 oz of wine, or 1 oz of hard liquor.  Manage stress. If you need help with this, ask your doctor.  Keep all follow-up visits as told by your doctor. This is important. Eating and drinking  Stay at a healthy weight.  Exercise regularly, as told by your doctor.  Drink enough fluid, especially when you:  Exercise.  Get sick.  Are in hot temperatures.  Eat healthy foods, such as:  Low-fat (lean) proteins.  Complex carbs (complex carbohydrates), such as whole wheat bread or brown rice.  Fresh fruits and vegetables.  Low-fat dairy products.  Healthy fats.  Drink enough  fluid to keep your pee (urine) clear or pale yellow. If you have diabetes:  Make sure you know the symptoms of hyperglycemia.  Follow your diabetes management plan, as told by your doctor. Make sure you:  Take insulin and medicines as told.  Follow your exercise plan.  Follow your meal plan. Eat on time. Do not skip meals.  Check your blood sugar as often as told. Make sure to check before and after exercise. If you exercise longer or in a different way than you normally do, check your blood sugar more often.  Follow your sick day plan whenever you cannot eat or drink normally. Make this plan ahead of time with your doctor.  Share your diabetes management plan with people in your workplace, school, and household.  Check your urine for ketones when you are ill and as told by your doctor.  Carry a card or wear jewelry that says that you have diabetes. Contact a doctor if:  Your blood sugar level is higher than 240 mg/dL (13.3 mmol/L) for 2 days in a row.  You have problems keeping your blood sugar in your target range.  High blood sugar happens often for you. Get help right away if:  You have trouble breathing.  You have a change in how you think, feel, or act (mental status).  You feel sick to your stomach (nauseous), and that feeling does not go away.  You cannot stop throwing up (vomiting). These symptoms may be an   emergency. Do not wait to see if the symptoms will go away. Get medical help right away. Call your local emergency services (911 in the [place] Do not drive yourself to the hospital.  Summary  Hyperglycemia is when the sugar (glucose) level in your blood is too high.  High blood sugar can happen to people who do or do not have diabetes.  Make sure you drink enough fluids, eat healthy foods, and exercise regularly.  Contact your doctor if you have problems keeping your blood sugar in your target range. This information is not intended to replace advice given  to you by your health care provider. Make sure you discuss any questions you have with your health care provider. Document Released: 02/04/2009 Document Revised: 12/26/2015 Document Reviewed: 12/26/2015 Elsevier Interactive Patient Education  2017 [employer]  

## 2017-11-22 NOTE — Progress Notes (Signed)
Patient called on call answering service. Patient wit nausea and headache. Blood glucose of 444 with eating 4 hours prior. Feeling jittery. Advised to go to ED for further evaluation. Patient states that she does have someone who can drive her there.  Patient agreed to go. Will follow up in office next week.

## 2017-11-22 NOTE — Progress Notes (Signed)
PATIENT [facility]ARE CENTER NOTE  Diagnosis: Hyperglycemia    Provider: Lanae Boast, FNP   Procedure: 1 Liter bolus of 0.9% Sodium Chloride, lab draw and CBG check   Note: Patient given 1 liter bolus of fluids and labs were drawn from IV. At completion of bolus, patient's CBG was down to 330. [name] Maples, [place] notified. Vitals stable. Discharge instructions given to patient. Patient alert, oriented and ambulatory at discharge.

## 2017-11-23 ENCOUNTER — Other Ambulatory Visit: Payer: Self-pay

## 2017-11-23 ENCOUNTER — Emergency Department (HOSPITAL_COMMUNITY)
Admission: EM | Admit: 2017-11-23 | Discharge: 2017-11-23 | Disposition: A | Discharge status: Home / Self Care | Payer: Self-pay | Attending: Emergency Medicine | Admitting: Emergency Medicine

## 2017-11-23 ENCOUNTER — Encounter (HOSPITAL_COMMUNITY): Payer: Self-pay | Admitting: *Deleted

## 2017-11-23 ENCOUNTER — Emergency Department (HOSPITAL_COMMUNITY): Payer: Self-pay

## 2017-11-23 DIAGNOSIS — K8020 Calculus of gallbladder without cholecystitis without obstruction: Secondary | ICD-10-CM

## 2017-11-23 DIAGNOSIS — R739 Hyperglycemia, unspecified: Secondary | ICD-10-CM

## 2017-11-23 LAB — COMPREHENSIVE METABOLIC PANEL
ALBUMIN: 3.4 g/dL — AB (ref 3.5–5.0)
ALT: 86 U/L — ABNORMAL HIGH (ref 0–44)
ALT: 98 IU/L — ABNORMAL HIGH (ref 0–32)
ANION GAP: 10 (ref 5–15)
AST: 48 U/L — AB (ref 15–41)
AST: 66 IU/L — ABNORMAL HIGH (ref 0–40)
Albumin/Globulin Ratio: 1.4 (ref 1.2–2.2)
Albumin: 3.9 g/dL (ref 3.5–5.5)
Alkaline Phosphatase: 100 U/L (ref 38–126)
Alkaline Phosphatase: 127 IU/L — ABNORMAL HIGH (ref 39–117)
BILIRUBIN TOTAL: 0.3 mg/dL (ref 0.3–1.2)
BUN/Creatinine Ratio: 22 (ref 9–23)
BUN: 16 mg/dL (ref 6–24)
BUN: 18 mg/dL (ref 6–20)
Bilirubin Total: 0.3 mg/dL (ref 0.0–1.2)
CHLORIDE: 101 mmol/L (ref 98–111)
CO2: 20 mmol/L (ref 20–29)
CO2: 23 mmol/L (ref 22–32)
Calcium: 8.8 mg/dL — ABNORMAL LOW (ref 8.9–10.3)
Calcium: 9.3 mg/dL (ref 8.7–10.2)
Chloride: 97 mmol/L (ref 96–106)
Creatinine, Ser: 0.66 mg/dL (ref 0.44–1.00)
Creatinine, Ser: 0.73 mg/dL (ref 0.57–1.00)
GFR calc Af Amer: 117 mL/min/{1.73_m2} (ref >59)
GFR calc Af Amer: >60 mL/min (ref >60)
GFR calc non Af Amer: 101 mL/min/{1.73_m2} (ref >59)
GFR calc non Af Amer: >60 mL/min (ref >60)
GLUCOSE: 419 mg/dL — AB (ref 70–99)
Globulin, Total: 2.8 g/dL (ref 1.5–4.5)
Glucose: 422 mg/dL — ABNORMAL HIGH (ref 65–99)
POTASSIUM: 3.3 mmol/L — AB (ref 3.5–5.1)
Potassium: 3.8 mmol/L (ref 3.5–5.2)
SODIUM: 134 mmol/L — AB (ref 135–145)
Sodium: 137 mmol/L (ref 134–144)
TOTAL PROTEIN: 6.7 g/dL (ref 6.5–8.1)
Total Protein: 6.7 g/dL (ref 6.0–8.5)

## 2017-11-23 LAB — CBC WITH DIFFERENTIAL/PLATELET
Basophils Absolute: 0 10*3/uL (ref 0.0–0.2)
Basos: 0 %
EOS (ABSOLUTE): 0.2 10*3/uL (ref 0.0–0.4)
Eos: 2 %
Hematocrit: 41.5 % (ref 34.0–46.6)
Hemoglobin: 13.4 g/dL (ref 11.1–15.9)
Immature Grans (Abs): 0 10*3/uL (ref 0.0–0.1)
Immature Granulocytes: 0 %
Lymphocytes Absolute: 3.2 10*3/uL — ABNORMAL HIGH (ref 0.7–3.1)
Lymphs: 32 %
MCH: 28.8 pg (ref 26.6–33.0)
MCHC: 32.3 g/dL (ref 31.5–35.7)
MCV: 89 fL (ref 79–97)
Monocytes Absolute: 0.5 10*3/uL (ref 0.1–0.9)
Monocytes: 5 %
Neutrophils Absolute: 6 10*3/uL (ref 1.4–7.0)
Neutrophils: 61 %
Platelets: 287 10*3/uL (ref 150–450)
RBC: 4.65 x10E6/uL (ref 3.77–5.28)
RDW: 14.3 % (ref 12.3–15.4)
WBC: 9.9 10*3/uL (ref 3.4–10.8)

## 2017-11-23 LAB — URINALYSIS, ROUTINE W REFLEX MICROSCOPIC
BACTERIA UA: NONE SEEN
BILIRUBIN URINE: NEGATIVE
Glucose, UA: >=500 mg/dL — AB
Hgb urine dipstick: NEGATIVE
Ketones, ur: NEGATIVE mg/dL
Leukocytes, UA: NEGATIVE
Nitrite: NEGATIVE
PH: 5 (ref 5.0–8.0)
Protein, ur: NEGATIVE mg/dL
SPECIFIC GRAVITY, URINE: 1.029 (ref 1.005–1.030)

## 2017-11-23 LAB — LIPASE, BLOOD: Lipase: 33 U/L (ref 11–51)

## 2017-11-23 LAB — CBC
HEMATOCRIT: 38.9 % (ref 36.0–46.0)
HEMOGLOBIN: 13.1 g/dL (ref 12.0–15.0)
MCH: 29.4 pg (ref 26.0–34.0)
MCHC: 33.7 g/dL (ref 30.0–36.0)
MCV: 87.2 fL (ref 78.0–100.0)
Platelets: 257 10*3/uL (ref 150–400)
RBC: 4.46 MIL/uL (ref 3.87–5.11)
RDW: 14 % (ref 11.5–15.5)
WBC: 9.5 10*3/uL (ref 4.0–10.5)

## 2017-11-23 MED ORDER — ONDANSETRON 4 MG PO TBDP
4.0000 mg | ORAL_TABLET | Freq: Once | ORAL | Status: AC | PRN
  Administered 2017-11-23: 4 mg via ORAL
  Filled 2017-11-23: qty 1

## 2017-11-23 MED ORDER — ONDANSETRON 4 MG PO TBDP: 4.0000 mg | ORAL_TABLET | Freq: Three times a day (TID) | ORAL | 0 refills | Status: DC | PRN

## 2017-11-23 MED ORDER — IOPAMIDOL (ISOVUE-300) INJECTION 61%
100.0000 mL | Freq: Once | INTRAVENOUS | Status: AC | PRN
  Administered 2017-11-23: 100 mL via INTRAVENOUS

## 2017-11-23 MED ORDER — DEXTROSE-NACL 5-0.45 % IV SOLN: INTRAVENOUS | Status: DC

## 2017-11-23 MED ORDER — ONDANSETRON HCL 4 MG/2ML IJ SOLN
INTRAMUSCULAR | Status: AC
  Filled 2017-11-23: qty 2

## 2017-11-23 MED ORDER — SODIUM CHLORIDE 0.9 % IV SOLN
INTRAVENOUS | Status: DC
  Administered 2017-11-23: 2.6 [IU]/h via INTRAVENOUS
  Filled 2017-11-23 (×2): qty 1

## 2017-11-23 MED ORDER — IOPAMIDOL (ISOVUE-300) INJECTION 61%
INTRAVENOUS | Status: AC
  Filled 2017-11-23: qty 100

## 2017-11-23 MED ORDER — HYDROCODONE-ACETAMINOPHEN 5-325 MG PO TABS: 1.0000 | ORAL_TABLET | Freq: Four times a day (QID) | ORAL | 0 refills | Status: DC | PRN

## 2017-11-23 MED ORDER — ONDANSETRON HCL 4 MG/2ML IJ SOLN
4.0000 mg | Freq: Once | INTRAMUSCULAR | Status: AC | PRN
  Administered 2017-11-23: 4 mg via INTRAVENOUS

## 2017-11-23 MED ORDER — METOCLOPRAMIDE HCL 5 MG/ML IJ SOLN
10.0000 mg | Freq: Once | INTRAMUSCULAR | Status: AC
  Administered 2017-11-23: 10 mg via INTRAVENOUS
  Filled 2017-11-23: qty 2

## 2017-11-23 NOTE — ED Notes (Signed)
Pt's CBG at this time = 321.

## 2017-11-23 NOTE — Discharge Instructions (Addendum)
Continue taking home medications as prescribed. Use Zofran as needed for nausea or vomiting. Use Tylenol as needed for mild to moderate pain.  Use Norco as needed for severe breakthrough pain.  Have caution, this may make you tired or groggy.  Do not drive or operate heavy machinery while taking this medicine. Be very careful with your diet over the next several days, avoid greasy, fatty, dairy products.  Eat small portions at a time. Follow-up with general surgery for further evaluation and management of your gallbladder. Follow-up with your primary care doctor for further management of your blood sugars. Return to the emergency room if you develop high fevers, persistent vomiting despite medication, severe abdominal pain, or any new or concerning symptoms.

## 2017-11-23 NOTE — ED Notes (Signed)
Pt's CBG at this time = 289.

## 2017-11-23 NOTE — ED Notes (Signed)
Pt's CBG at this time = 310.

## 2017-11-23 NOTE — ED Provider Notes (Signed)
[facility] DEPT Provider Note   CSN: [id number] Arrival date & time: 11/22/17  2310     History   Chief Complaint Chief Complaint  Patient presents with  . Hyperglycemia    HPI Lori Shea is a 42 y.o. female presenting for evaluation of hyperglycemia.  Patient states for the past several months, she has had difficulty controlling her blood sugars.  Is been between 203 100, previously it was rarely above 150.  Today she had a doctor's office and her blood sugar was noted to be in the 400s.  She received IV hydration, and her blood sugar improved to the 300s.  After dinner, she checked her blood sugar again it was once again in the 400s, she was told to come to the emergency room.  This evening she had acute onset nausea, vomiting, and feeling gassy.  She denies fevers, chills, chest pain, shortness of breath, abdominal pain, urinary symptoms, abnormal bowel movements.  She takes Lantus, Januvia, and metformin.  Januvia dose increased today at her primary care visit.  Additional history includes PCOS, asthma, and hypertension, all of which are well controlled.  HPI  Past Medical History:  Diagnosis Date  . Allergy    Foods:  mango, honey, corn.  Allergic to "everything tested"  15 years ago in [place]  . Asthma age 66 yo   Triggers:  cold weather, exercise, allergens, anxiety, URIs  . Cholelithiasis age 42 yo   Asymptomatic  . Diabetes mellitus 2012   Was diagnosed 2 years earlier with PCOS  . Elevated liver enzymes 09/13/2016  . Hypertension 2015  . Kidney stones   . Migraines 2009  . Morbid obesity ([place])   . PCOS (polycystic ovarian syndrome) 2010    Patient Active Problem List   Diagnosis Date Noted  . LGSIL on Pap smear of cervix 07/31/2017  . Alopecia of scalp 05/09/2017  . Pain in left toe(s) 12/27/2016  . Closed avulsion fracture of lateral malleolus of right fibula 12/27/2016  . Morbid obesity ([place]) 09/13/2016  . Elevated liver  enzymes 09/13/2016  . Allergy   . Asthma   . Hypertension 04/23/2013  . Diabetes ([place]) 04/23/2010  . PCOS (polycystic ovarian syndrome) 04/23/2008  . Migraines 04/24/2007    Past Surgical History:  Procedure Laterality Date  . COLPOSCOPY N/A      OB History    Gravida  4   Para  1   Term  1   Preterm      AB  3   Living  1     SAB  3   TAB      Ectopic      Multiple      Live Births               Home Medications    Prior to Admission medications   Medication Sig Start Date End Date Taking? Authorizing Provider  albuterol (PROVENTIL HFA;VENTOLIN HFA) 108 (90 Base) MCG/ACT inhaler Inhale 2 puffs into the lungs every 4 (four) hours as needed for wheezing or shortness of breath. 05/09/17  Yes Dorena Dew, FNP  albuterol (PROVENTIL) (2.5 MG/3ML) 0.083% nebulizer solution Take 3 mLs (2.5 mg total) by nebulization every 6 (six) hours as needed for wheezing or shortness of breath. 08/19/17  Yes Dorena Dew, FNP  amitriptyline (ELAVIL) 25 MG tablet Take 1 tablet (25 mg total) by mouth at bedtime. Patient taking differently: Take 25 mg by mouth at bedtime as needed (  headaches).  11/22/17  Yes Lanae Boast, FNP  Azilsartan-Chlorthalidone (EDARBYCLOR) 40-25 MG TABS Take 1 tablet by mouth daily. 05/09/17  Yes Dorena Dew, FNP  Black Elderberry,Berry-Flower, 575 MG CAPS Take 1 capsule by mouth daily.   Yes [provider]  cetirizine (ZYRTEC) 10 MG tablet Take 1 tablet (10 mg total) by mouth daily. 04/08/17  Yes Dorena Dew, FNP  Cinnamon 500 MG capsule Take 500 mg by mouth daily.   Yes [provider]  Dexlansoprazole (DEXILANT) 30 MG capsule Take 1 capsule (30 mg total) by mouth daily. Take on empty stomach 11/22/17  Yes Lanae Boast, FNP  Fluticasone-Salmeterol (ADVAIR DISKUS) 100-50 MCG/DOSE AEPB Inhale 1 puff into the lungs every 12 (twelve) hours. 11/22/17  Yes Lanae Boast, FNP  insulin glargine (LANTUS) 100 UNIT/ML injection  Take 35 units in am and 25 at bedtime 11/22/17  Yes Lanae Boast, FNP  Magnesium Citrate 100 MG TABS Take 100 mg by mouth every evening.   Yes [provider]  meloxicam (MOBIC) 15 MG tablet Take 15 mg by mouth daily.   Yes [provider]  metFORMIN (GLUCOPHAGE) 500 MG tablet Take 1 tablet (500 mg total) by mouth 2 (two) times daily with a meal. 11/22/17 12/22/17 Yes Lanae Boast, FNP  mometasone (NASONEX) 50 MCG/ACT nasal spray Place 2 sprays into the nose daily. 04/08/17  Yes Dorena Dew, FNP  montelukast (SINGULAIR) 10 MG tablet Take 1 tablet (10 mg total) by mouth daily. 11/22/17  Yes Lanae Boast, FNP  Multiple Vitamins-Minerals (MULTIVITAMIN ADULT PO) Take 1 tablet by mouth daily.   Yes [provider]  Psyllium (FIBER) 0.52 g CAPS Take 1 capsule by mouth daily.   Yes [provider]  rizatriptan (MAXALT-MLT) 10 MG disintegrating tablet Take 1 tablet (10 mg total) by mouth as needed for migraine. May repeat in 2 hours if needed.  Max 30 mg/24 h 09/17/17  Yes Dorena Dew, FNP  sitaGLIPtin (JANUVIA) 100 MG tablet Take 1 tablet (100 mg total) by mouth daily. 11/22/17  Yes Lanae Boast, FNP  EASY COMFORT PEN NEEDLES 32G X 4 MM MISC use as directed test 2 times daily 05/14/17   [provider]  HYDROcodone-acetaminophen (NORCO/VICODIN) 5-325 MG tablet Take 1 tablet by mouth every 6 (six) hours as needed for severe pain. 11/23/17   Axle Parfait, PA-C  Insulin Pen Needle (PEN NEEDLES) 32G X 5 MM MISC 100 each by Does not apply route 2 (two) times daily. 05/14/17   Dorena Dew, FNP  norgestimate-ethinyl estradiol (ORTHO-CYCLEN,SPRINTEC,PREVIFEM) 0.25-35 MG-MCG tablet Take 1 tablet by mouth daily. Patient not taking: Reported on 08/19/2017 05/03/17 06/18/23  Scot Jun, FNP  ondansetron (ZOFRAN ODT) 4 MG disintegrating tablet Take 1 tablet (4 mg total) by mouth every 8 (eight) hours as needed for nausea or vomiting. 11/23/17   Claborn Janusz,  Alahna Dunne, PA-C  predniSONE (STERAPRED UNI-PAK 21 TAB) 10 MG (21) TBPK tablet Per written instructions for 6 days tapered Patient not taking: Reported on 09/10/2017 08/19/17   Dorena Dew, FNP    Family History Family History  Problem Relation Age of Onset  . Diabetes Father   . Hyperlipidemia Father   . Hypertension Father   . Heart disease Father   . Kidney disease Father        Developed after 2nd CABG  . Diabetes Paternal Grandfather   . Heart disease Paternal Grandfather   . Hyperlipidemia Paternal Grandfather   . Hypertension Paternal Grandfather   .  Kidney disease Paternal Grandfather     Social History Social History   Tobacco Use  . Smoking status: Never Smoker  . Smokeless tobacco: Never Used  Substance Use Topics  . Alcohol use: No  . Drug use: No     Allergies   Corn-containing products; Honey; Mango flavor; and Victoza [liraglutide]   Review of Systems Review of Systems  Gastrointestinal: Positive for nausea and vomiting.  Endocrine:       Hyperglycemia  All other systems reviewed and are negative.    Physical Exam Updated Vital Signs BP 113/68 (BP Location: Left Arm)   Pulse 93   Temp 98.2 F (36.8 C) (Oral)   Resp 18   Ht 5\' 6"  (1.676 m)   Wt 118.2 kg (260 lb 9.6 oz)   LMP 10/29/2017   SpO2 98%   BMI 42.06 kg/m   Physical Exam  Constitutional: She is oriented to person, place, and time. She appears well-developed and well-nourished. No distress.  In no distress  HENT:  Head: Normocephalic and atraumatic.  Eyes: Pupils are equal, round, and reactive to light. Conjunctivae and EOM are normal.  Neck: Normal range of motion. Neck supple.  Cardiovascular: Normal rate, regular rhythm and intact distal pulses.  Pulmonary/Chest: Effort normal and breath sounds normal. No respiratory distress. She has no wheezes.  Abdominal: Soft. Bowel sounds are normal. She exhibits no distension and no mass. There is tenderness. There is no rebound and no  guarding.  Exam limited due to pt body habitus. TTP of epigastric and ruq abd. Negative murphys  Musculoskeletal: Normal range of motion.  Neurological: She is alert and oriented to person, place, and time.  Skin: Skin is warm and dry. Capillary refill takes less than 2 seconds.  Psychiatric: She has a normal mood and affect.  Nursing note and vitals reviewed.    ED Treatments / Results  Labs (all labs ordered are listed, but only abnormal results are displayed) Labs Reviewed  COMPREHENSIVE METABOLIC PANEL - Abnormal; Notable for the following components:      Result Value   Sodium 134 (*)    Potassium 3.3 (*)    Glucose, Bld 419 (*)    Calcium 8.8 (*)    Albumin 3.4 (*)    AST 48 (*)    ALT 86 (*)    All other components within normal limits  URINALYSIS, ROUTINE W REFLEX MICROSCOPIC - Abnormal; Notable for the following components:   APPearance HAZY (*)    Glucose, UA >=500 (*)    All other components within normal limits  CBG MONITORING, ED - Abnormal; Notable for the following components:   Glucose-Capillary 390 (*)    All other components within normal limits  LIPASE, BLOOD  CBC  I-STAT BETA HCG BLOOD, ED ([facility], [facility], [facility] ONLY)  CBG MONITORING, ED  CBG MONITORING, ED    EKG None  Radiology Ct Abdomen Pelvis W Contrast  Result Date: 11/23/2017 CLINICAL DATA:  Mid abdominal pain with nausea and vomiting. EXAM: CT ABDOMEN AND PELVIS WITH CONTRAST TECHNIQUE: Multidetector CT imaging of the abdomen and pelvis was performed using the standard protocol following bolus administration of intravenous contrast. CONTRAST:  111mL ISOVUE-300 IOPAMIDOL (ISOVUE-300) INJECTION 61% COMPARISON:  CT 12/28/2010 FINDINGS: Lower chest: Calcified granuloma at the anterior costophrenic angle. Hepatobiliary: Prominent hepatomegaly with liver spanning 26 cm cranial caudal. Diffusely decreased density consistent with steatosis. Gallbladder distention with vague intraluminal density likely gallstones.  No pericholecystic inflammatory change. No biliary dilatation. Pancreas: No ductal  dilatation or inflammation. Spleen: Mildly enlarged spanning 14.3 cm. Multiple calcified splenic granuloma. Adrenals/Urinary Tract: Normal adrenal glands. No hydronephrosis or perinephric edema. Homogeneous renal enhancement with symmetric excretion on delayed phase imaging. Small parapelvic cyst in the upper right kidney. Punctate nonobstructing stone in the left kidney. Urinary bladder is physiologically distended without wall thickening. Stomach/Bowel: Stomach is nondistended. No small bowel wall thickening, inflammatory change or obstruction. Normal appendix. Moderate colonic stool burden throughout the entire colon. No bowel wall thickening or inflammatory change. Vascular/Lymphatic: No significant vascular findings are present. No enlarged abdominal or pelvic lymph nodes. Reproductive: 3.8 cm cyst in the right ovary, with a 1.7 cm cyst or follicle. Left ovary is normal in size. Uterus slightly bulbous but without dominant mass. Other: Small fat containing umbilical hernia. No free air, free fluid, or intra-abdominal fluid collection. Musculoskeletal: There are no acute or suspicious osseous abnormalities. IMPRESSION: 1. Mild gallbladder distention with intraluminal low-density, possible sludge or noncalcified stone. Consider right upper quadrant ultrasound if there is clinical concern for acute gallbladder pathology. 2. Hepatosplenomegaly and hepatic steatosis. 3. Nonobstructing left renal stone. 4. Right ovarian cysts largest measuring 3.8 cm. These are presumably benign, and no dedicated imaging follow-up is needed. Electronically Signed   By: Jeb Levering M.D.   On: 11/23/2017 04:46   US Abdomen Limited Ruq  Result Date: 11/23/2017 CLINICAL DATA:  Abdominal pain for 2 days. EXAM: ULTRASOUND ABDOMEN LIMITED RIGHT UPPER QUADRANT COMPARISON:  Abdominal CT earlier this day. FINDINGS: Gallbladder: Distended containing a 3.3  cm gallstone. No wall thickening or pericholecystic fluid visualized. No sonographic Murphy sign noted by sonographer. Common bile duct: Diameter: 4 mm, normal. Liver: No focal lesion identified. Diffusely increased in parenchymal echogenicity. Portal vein is patent on color Doppler imaging with normal direction of blood flow towards the liver. IMPRESSION: 1. Gallstone without secondary findings of acute cholecystitis. No biliary dilatation. 2. Hepatic steatosis. Electronically Signed   By: Jeb Levering M.D.   On: 11/23/2017 06:13    Procedures Procedures (including critical care time)  Medications Ordered in ED Medications  dextrose 5 %-0.45 % sodium chloride infusion (has no administration in time range)  insulin regular (NOVOLIN R,HUMULIN R) 100 Units in sodium chloride 0.9 % 100 mL (1 Units/mL) infusion ( Intravenous Stopped 11/23/17 0730)  iopamidol (ISOVUE-300) 61 % injection (has no administration in time range)  ondansetron (ZOFRAN-ODT) disintegrating tablet 4 mg (4 mg Oral Given 11/23/17 0018)  ondansetron (ZOFRAN) injection 4 mg (4 mg Intravenous Given 11/23/17 0141)  metoCLOPramide (REGLAN) injection 10 mg (10 mg Intravenous Given 11/23/17 0309)  iopamidol (ISOVUE-300) 61 % injection 100 mL (100 mLs Intravenous Contrast Given 11/23/17 0326)     Initial Impression / Assessment and Plan / ED Course  I have reviewed the triage vital signs and the nursing notes.  Pertinent labs & imaging results that were available during my care of the patient were reviewed by me and considered in my medical decision making (see chart for details).     Patient presented for evaluation of hyperglycemia, nausea, and vomiting.  Initial exam shows patient who is afebrile not tachycardic.  She appears nontoxic.  Mild improvement of vomiting with Zofran given in triage, however patient still reports nausea.  Labs show hyperglycemia at 419 and mild elevation of liver enzymes.  Bili reassuring.  Lipase reassuring.   No leukocytosis.  Urine without ketones, no anion gap or abnormality of bicarb, no signs of DKA at this time.  Will start patient on glucose stabilizer.  As patient has new onset nausea, vomiting, and gas in the setting of unexplained hyperglycemia, will obtain CT abdomen to ensure no intra-abdominal infection.  CT abdomen shows gallstones and ovarian cyst.  Recommending ultrasound of right upper quadrant if concern about acute gallbladder pathology.  With elevated liver enzymes and gallstones on CT, will obtain right upper quadrant ultrasound.  On reassessment of symptoms, patient reports resolution of nausea.  Ultrasound shows 3.3 cm gallstone without inflammation the gallbladder wall, doubt cholecystitis.  Blood sugar improved to 289.  Tolerating p.o.  Discussed findings with patient.  Discussed that at this time she has cholelithiasis, which will likely need to be managed by surgery on an outpatient basis.  This may be part of the source of her hyperglycemia.  No obvious infection found at this time.  Discussed follow-up with her PCP regarding blood sugar control.  Patient discharged with antiemetics and Norco as needed for severe pain.  At this time, patient appears safe for discharge.  Return precautions given.  Patient states she understands and agrees.  Final Clinical Impressions(s) / ED Diagnoses   Final diagnoses:  Gallstones  Hyperglycemia    ED Discharge Orders        Ordered    ondansetron (ZOFRAN ODT) 4 MG disintegrating tablet  Every 8 hours PRN     11/23/17 0701    HYDROcodone-acetaminophen (NORCO/VICODIN) 5-325 MG tablet  Every 6 hours PRN     11/23/17 0701       Adeleigh Barletta, PA-C 11/23/17 0751    Molpus, Jenny Reichmann, MD 11/23/17 [phone]

## 2017-11-23 NOTE — ED Triage Notes (Addendum)
Pt reports glucose was in the 400's tonight, c/o nausea, headache and vomiting.  Pt reports she was seen by her provider earlier today and given insulin and iv hydration for high blood sugar. She takes lantus, [place] and metformin. She reports she did not take her 24 units of lantus yet tonight.

## 2017-11-23 NOTE — ED Notes (Signed)
Pt's CBG at this time = 318.

## 2017-11-23 NOTE — ED Notes (Signed)
Urine cultures sent down with U/A.

## 2017-11-23 NOTE — ED Notes (Signed)
Pt is A & O x4.  She understood AVS instructions. Questions were answered by RN.

## 2017-11-25 LAB — CBG MONITORING, ED
GLUCOSE-CAPILLARY: 344 mg/dL — AB (ref 70–99)
GLUCOSE-CAPILLARY: 321 mg/dL — AB (ref 70–99)
Glucose-Capillary: 313 mg/dL — ABNORMAL HIGH (ref 70–99)
Glucose-Capillary: 310 mg/dL — ABNORMAL HIGH (ref 70–99)
Glucose-Capillary: 289 mg/dL — ABNORMAL HIGH (ref 70–99)

## 2017-11-25 LAB — I-STAT BETA HCG BLOOD, ED (MC, WL, AP ONLY)

## 2017-11-25 MED FILL — ONDANSETRON ODT 4 MG TABLET: 4 | 7 days supply | Qty: 20 | Fill #0

## 2017-11-29 MED FILL — ?MONTELUKAST SODI 10MG TAB: 10 | 30 days supply | Qty: 30 | Fill #3

## 2017-12-04 ENCOUNTER — Other Ambulatory Visit: Payer: Self-pay

## 2017-12-04 DIAGNOSIS — E119 Type 2 diabetes mellitus without complications: Secondary | ICD-10-CM

## 2017-12-04 DIAGNOSIS — Z794 Long term (current) use of insulin: Principal | ICD-10-CM

## 2017-12-04 DIAGNOSIS — I10 Essential (primary) hypertension: Secondary | ICD-10-CM

## 2017-12-04 MED ORDER — SITAGLIPTIN PHOSPHATE 100 MG PO TABS: 100.0000 mg | ORAL_TABLET | Freq: Every day | ORAL | 5 refills | Status: DC

## 2017-12-04 MED ORDER — AZILSARTAN-CHLORTHALIDONE 40-25 MG PO TABS: 1.0000 | ORAL_TABLET | Freq: Every day | ORAL | 1 refills | Status: DC

## 2017-12-04 MED ORDER — ALBUTEROL SULFATE HFA 108 (90 BASE) MCG/ACT IN AERS: 2.0000 | INHALATION_SPRAY | RESPIRATORY_TRACT | 2 refills | Status: DC | PRN

## 2017-12-06 ENCOUNTER — Ambulatory Visit (INDEPENDENT_AMBULATORY_CARE_PROVIDER_SITE_OTHER): Payer: Self-pay | Admitting: Family Medicine

## 2017-12-06 ENCOUNTER — Encounter: Payer: Self-pay | Admitting: Family Medicine

## 2017-12-06 VITALS — BP 103/61 | HR 91 | Temp 98.3°F | Resp 16 | Ht 66.0 in | Wt 262.0 lb

## 2017-12-06 DIAGNOSIS — Z794 Long term (current) use of insulin: Secondary | ICD-10-CM

## 2017-12-06 DIAGNOSIS — E119 Type 2 diabetes mellitus without complications: Secondary | ICD-10-CM

## 2017-12-06 LAB — GLUCOSE, POCT (MANUAL RESULT ENTRY): POC Glucose: 239 mg/dl — AB (ref 70–99)

## 2017-12-06 MED ORDER — INSULIN GLARGINE 100 UNIT/ML SOLOSTAR PEN: 40.0000 [IU] | PEN_INJECTOR | Freq: Every day | SUBCUTANEOUS | 11 refills | Status: DC

## 2017-12-06 MED ORDER — AMOXICILLIN 500 MG PO CAPS: 500.0000 mg | ORAL_CAPSULE | Freq: Three times a day (TID) | ORAL | 0 refills | Status: DC

## 2017-12-06 MED ORDER — PROMETHAZINE HCL 12.5 MG PO TABS: 12.5000 mg | ORAL_TABLET | Freq: Three times a day (TID) | ORAL | 0 refills | Status: DC | PRN

## 2017-12-06 MED FILL — PROMETHAZINE 12.5 MG TABLET: 12.5 | 7 days supply | Qty: 20 | Fill #0

## 2017-12-06 MED FILL — AMOXICILLIN 500 MG CAPSULE: 500 | 10 days supply | Qty: 30 | Fill #0

## 2017-12-06 NOTE — Patient Instructions (Signed)
Dental Abscess A dental abscess is pus in or around a tooth. Follow these instructions at home:  Take medicines only as told by your dentist.  If you were prescribed antibiotic medicine, finish all of it even if you start to feel better.  Rinse your mouth (gargle) often with salt water.  Do not drive or use heavy machinery, like a lawn mower, while taking pain medicine.  Do not apply heat to the outside of your mouth.  Keep all follow-up visits as told by your dentist. This is important. Contact a doctor if:  Your pain is worse, and medicine does not help. Get help right away if:  You have a fever or chills.  Your symptoms suddenly get worse.  You have a very bad headache.  You have problems breathing or swallowing.  You have trouble opening your mouth.  You have puffiness (swelling) in your neck or around your eye. This information is not intended to replace advice given to you by your health care provider. Make sure you discuss any questions you have with your health care provider. Document Released: 08/24/2014 Document Revised: 09/15/2015 Document Reviewed: 04/06/2014 Elsevier Interactive Patient Education  2018 Elsevier Inc[employer]  

## 2017-12-06 NOTE — Progress Notes (Signed)
[facility]  DIABETES TYPE II FOLLOW UP VISIT   PROVIDER: Lanae Boast, FNP   SUBJECTIVE:   42 y.o. female  Lori Shea is 41 y.o.   here for follow-up of Type 2 diabetes mellitus.  Patient seen at ED on 11/23/2017 d/t nausea and vomiting. CT reveals gallstone. Recommend to have outpatient surgery.  Patient states that she did [place] 100mg  for a week and increased lantus 40 units BID. FBS have been 180s.  Patient states that she would like to wait until Jan 2020 for surgery due to finances and being a single parent. Patient is not in pain at the present time.     Current Outpatient Medications  Medication Sig Dispense Refill  . albuterol (PROVENTIL HFA;VENTOLIN HFA) 108 (90 Base) MCG/ACT inhaler Inhale 2 puffs into the lungs every 4 (four) hours as needed for wheezing or shortness of breath. 2 each 2  . albuterol (PROVENTIL) (2.5 MG/3ML) 0.083% nebulizer solution Take 3 mLs (2.5 mg total) by nebulization every 6 (six) hours as needed for wheezing or shortness of breath. 75 mL 0  . amitriptyline (ELAVIL) 25 MG tablet Take 1 tablet (25 mg total) by mouth at bedtime. (Patient taking differently: Take 25 mg by mouth at bedtime as needed (headaches). ) 30 tablet 5  . Azilsartan-Chlorthalidone (EDARBYCLOR) 40-25 MG TABS Take 1 tablet by mouth daily. 90 tablet 1  . Black Elderberry,Berry-Flower, 575 MG CAPS Take 1 capsule by mouth daily.    . cetirizine (ZYRTEC) 10 MG tablet Take 1 tablet (10 mg total) by mouth daily. 30 tablet 11  . Cinnamon 500 MG capsule Take 500 mg by mouth daily.    Marland Kitchen Dexlansoprazole (DEXILANT) 30 MG capsule Take 1 capsule (30 mg total) by mouth daily. Take on empty stomach 30 capsule 5  . EASY COMFORT PEN NEEDLES 32G X 4 MM MISC use as directed test 2 times daily  3  . Fluticasone-Salmeterol (ADVAIR DISKUS) 100-50 MCG/DOSE AEPB Inhale 1 puff into the lungs every 12 (twelve) hours. 60 each 2  . HYDROcodone-acetaminophen  (NORCO/VICODIN) 5-325 MG tablet Take 1 tablet by mouth every 6 (six) hours as needed for severe pain. 4 tablet 0  . insulin glargine (LANTUS) 100 UNIT/ML injection Take 35 units in am and 25 at bedtime 10 mL 11  . Insulin Pen Needle (PEN NEEDLES) 32G X 5 MM MISC 100 each by Does not apply route 2 (two) times daily. 100 each 3  . Magnesium Citrate 100 MG TABS Take 100 mg by mouth every evening.    . meloxicam (MOBIC) 15 MG tablet Take 15 mg by mouth daily.    . metFORMIN (GLUCOPHAGE) 500 MG tablet Take 1 tablet (500 mg total) by mouth 2 (two) times daily with a meal. 60 tablet 5  . mometasone (NASONEX) 50 MCG/ACT nasal spray Place 2 sprays into the nose daily. 17 g 12  . montelukast (SINGULAIR) 10 MG tablet Take 1 tablet (10 mg total) by mouth daily. 30 tablet 5  . Multiple Vitamins-Minerals (MULTIVITAMIN ADULT PO) Take 1 tablet by mouth daily.    . norgestimate-ethinyl estradiol (ORTHO-CYCLEN,SPRINTEC,PREVIFEM) 0.25-35 MG-MCG tablet Take 1 tablet by mouth daily. 1 Package 3  . ondansetron (ZOFRAN ODT) 4 MG disintegrating tablet Take 1 tablet (4 mg total) by mouth every 8 (eight) hours as needed for nausea or vomiting. 20 tablet 0  . predniSONE (STERAPRED UNI-PAK 21 TAB) 10 MG (21) TBPK tablet Per written instructions for 6 days  tapered 21 tablet 0  . Psyllium (FIBER) 0.52 g CAPS Take 1 capsule by mouth daily.    . rizatriptan (MAXALT-MLT) 10 MG disintegrating tablet Take 1 tablet (10 mg total) by mouth as needed for migraine. May repeat in 2 hours if needed.  Max 30 mg/24 h 12 tablet 6  . sitaGLIPtin (JANUVIA) 100 MG tablet Take 1 tablet (100 mg total) by mouth daily. 30 tablet 5   No current facility-administered medications for this visit.    Review of Systems  Constitutional: Negative.   Respiratory: Negative.   Cardiovascular: Negative.   Gastrointestinal: Negative.   Neurological: Negative.   Psychiatric/Behavioral: Negative.       OBJECTIVE:  BP 103/61 (BP Location: Left Arm,  Patient Position: Sitting, Cuff Size: Large)   Pulse 91   Temp 98.3 F (36.8 C) (Oral)   Resp 16   Ht 5\' 6"  (1.676 m)   Wt 262 lb (118.8 kg)   SpO2 97%   BMI 42.29 kg/m   Lab Results  Component Value Date   HGBA1C 13.2 (A) 11/22/2017    Lab Results  Component Value Date   MICROALBUR 4.03 (H) 02/09/2009    Lab Results  Component Value Date   CHOL 180 07/19/2016   HDL 60 07/19/2016   LDLCALC 98 07/19/2016   TRIG 109 07/19/2016   CHOLHDL 3.6 Ratio 02/09/2009     Physical Exam  Constitutional: She is oriented to person, place, and time and well-developed, well-nourished, and in no distress. No distress.  HENT:  Head: Normocephalic and atraumatic.  Eyes: Pupils are equal, round, and reactive to light. Conjunctivae and EOM are normal.  Neck: Normal range of motion. Neck supple.  Cardiovascular: Normal rate, regular rhythm and intact distal pulses. Exam reveals no gallop and no friction rub.  No murmur heard. Pulmonary/Chest: Effort normal and breath sounds normal. No respiratory distress. She has no wheezes.  Abdominal: Soft. Bowel sounds are normal. There is no tenderness.  Musculoskeletal: Normal range of motion. She exhibits no edema or tenderness.  Lymphadenopathy:    She has no cervical adenopathy.  Neurological: She is alert and oriented to person, place, and time. Gait normal.  Skin: Skin is warm and dry.  Psychiatric: Mood, memory, affect and judgment normal.  Nursing note and vitals reviewed.    Diabetes Mellitus: poorly controlled  ASSESSMENT/PLAN: 1. Type 2 diabetes mellitus without complication, with long-term current use of insulin (HCC) Continue with Lantus 40 unit BID and 100mg  [place]. Will continue to monitor.  - Glucose (CBG)    Issues reviewed with her:  Recommendations: 1.  Patient is counseled on appropriate foot care. 2.  BP goal < 130/80. 3.  LDL goal of < 100, HDL > 40 and TG < 150. 4.  Eye Exam yearly and Dental Exam every 6 months. 5.   Dietary recommendations: carb modified 6.  Physical Activity recommendations:  150 minutes per week 7.  Medication recommendations at this time are as follows:  See orders 8.  Return to clinic in 3-4 months   The patient was given clear instructions to go to ER or return to medical center if symptoms do not improve, worsen or new problems develop. Return to the clinic for scheduled follow up visits. Take medications as prescribed. The patient verbalized understanding and agreed with plan of care.    Ms. Doug Sou. Nathaneil Canary, FNP-BC Patient [facility] [address] [place], [place] [address] [phone]

## 2017-12-24 ENCOUNTER — Telehealth: Payer: Self-pay

## 2017-12-24 ENCOUNTER — Other Ambulatory Visit: Payer: Self-pay

## 2017-12-24 DIAGNOSIS — K219 Gastro-esophageal reflux disease without esophagitis: Secondary | ICD-10-CM

## 2017-12-24 MED ORDER — DEXLANSOPRAZOLE 30 MG PO CPDR: 30.0000 mg | DELAYED_RELEASE_CAPSULE | Freq: Every day | ORAL | 5 refills | Status: DC

## 2017-12-24 NOTE — Telephone Encounter (Signed)
Refill for dexilant sent into pharmacy. Thanks!  

## 2017-12-26 MED FILL — ?AMITRIPTYLINE HCL 25 MG TA: 25 | 30 days supply | Qty: 30 | Fill #1

## 2017-12-26 MED FILL — ?METFORMIN HCL 1000MG TABS: 1000 | 30 days supply | Qty: 60 | Fill #2

## 2017-12-26 MED FILL — ?MONTELUKAST SODI 10MG TAB: 10 | 30 days supply | Qty: 30 | Fill #4

## 2017-12-26 MED FILL — !JANUVIA 100MG TABLET: 100 | 30 days supply | Qty: 30 | Fill #1

## 2017-12-29 ENCOUNTER — Ambulatory Visit (HOSPITAL_COMMUNITY)
Admission: EM | Admit: 2017-12-29 | Discharge: 2017-12-29 | Disposition: A | Discharge status: Home / Self Care | Payer: Medicaid Other | Attending: Internal Medicine | Admitting: Internal Medicine

## 2017-12-29 DIAGNOSIS — G44209 Tension-type headache, unspecified, not intractable: Secondary | ICD-10-CM

## 2017-12-29 DIAGNOSIS — R110 Nausea: Secondary | ICD-10-CM

## 2017-12-29 DIAGNOSIS — R42 Dizziness and giddiness: Secondary | ICD-10-CM

## 2017-12-29 MED ORDER — METOCLOPRAMIDE HCL 5 MG/ML IJ SOLN
5.0000 mg | Freq: Once | INTRAMUSCULAR | Status: AC
  Administered 2017-12-29: 5 mg via INTRAMUSCULAR

## 2017-12-29 MED ORDER — NAPROXEN 500 MG PO TABS: 500.0000 mg | ORAL_TABLET | Freq: Two times a day (BID) | ORAL | 0 refills | Status: DC | PRN

## 2017-12-29 MED ORDER — KETOROLAC TROMETHAMINE 60 MG/2ML IM SOLN
60.0000 mg | Freq: Once | INTRAMUSCULAR | Status: AC
  Administered 2017-12-29: 60 mg via INTRAMUSCULAR

## 2017-12-29 MED ORDER — METOCLOPRAMIDE HCL 5 MG/ML IJ SOLN
INTRAMUSCULAR | Status: AC
  Filled 2017-12-29: qty 2

## 2017-12-29 MED ORDER — MECLIZINE HCL 12.5 MG PO TABS: 12.5000 mg | ORAL_TABLET | Freq: Three times a day (TID) | ORAL | 0 refills | Status: DC | PRN

## 2017-12-29 MED ORDER — DEXAMETHASONE SODIUM PHOSPHATE 10 MG/ML IJ SOLN
INTRAMUSCULAR | Status: AC
  Filled 2017-12-29: qty 1

## 2017-12-29 MED ORDER — DEXAMETHASONE SODIUM PHOSPHATE 10 MG/ML IJ SOLN
10.0000 mg | Freq: Once | INTRAMUSCULAR | Status: AC
  Administered 2017-12-29: 10 mg via INTRAMUSCULAR

## 2017-12-29 MED ORDER — KETOROLAC TROMETHAMINE 60 MG/2ML IM SOLN
INTRAMUSCULAR | Status: AC
  Filled 2017-12-29: qty 2

## 2017-12-29 NOTE — ED Provider Notes (Signed)
[facility]    CSN: [id number] Arrival date & time: 12/29/17  1009     History   Chief Complaint Chief Complaint  Patient presents with  . Headache    HPI Lori Shea is a 42 y.o. female history of hypertension, DM type II, PCOS, asthma presenting today for evaluation of a headache.  Patient states that she has a history of migraines, and believe this to be a typical migraine, but this is persisted longer than normal.  Headache began 1 week ago and has waxed and waned since then.  Notes headache her neck, occipital region and coming up into the frontal region, also noting pain by sinuses and in teeth.  Has associated nausea, photophobia, phonophobia, dizziness.  Dizziness is described as room spinning when moving suddenly, going from lying to sitting or positioning head downward.  Onset was gradual.  Denies worse headache of life.  Has had occasional blurry vision that comes and goes, but denies any persistent vision changes or lack of vision.  Denies difficulty speaking.  Denies associated URI symptoms of congestion, cough or sore throat.  HPI  Past Medical History:  Diagnosis Date  . Allergy    Foods:  mango, honey, corn.  Allergic to "everything tested"  15 years ago in [place]  . Asthma age 50 yo   Triggers:  cold weather, exercise, allergens, anxiety, URIs  . Cholelithiasis age 5 yo   Asymptomatic  . Diabetes mellitus 2012   Was diagnosed 2 years earlier with PCOS  . Elevated liver enzymes 09/13/2016  . Hypertension 2015  . Kidney stones   . Migraines 2009  . Morbid obesity ([place])   . PCOS (polycystic ovarian syndrome) 2010    Patient Active Problem List   Diagnosis Date Noted  . LGSIL on Pap smear of cervix 07/31/2017  . Alopecia of scalp 05/09/2017  . Pain in left toe(s) 12/27/2016  . Closed avulsion fracture of lateral malleolus of right fibula 12/27/2016  . Morbid obesity ([place]) 09/13/2016  . Elevated liver enzymes 09/13/2016  . Allergy   . Asthma    . Hypertension 04/23/2013  . Diabetes ([place]) 04/23/2010  . PCOS (polycystic ovarian syndrome) 04/23/2008  . Migraines 04/24/2007    Past Surgical History:  Procedure Laterality Date  . COLPOSCOPY N/A     OB History    Gravida  4   Para  1   Term  1   Preterm      AB  3   Living  1     SAB  3   TAB      Ectopic      Multiple      Live Births               Home Medications    Prior to Admission medications   Medication Sig Start Date End Date Taking? Authorizing Provider  albuterol (PROVENTIL HFA;VENTOLIN HFA) 108 (90 Base) MCG/ACT inhaler Inhale 2 puffs into the lungs every 4 (four) hours as needed for wheezing or shortness of breath. 12/04/17   Lanae Boast, FNP  albuterol (PROVENTIL) (2.5 MG/3ML) 0.083% nebulizer solution Take 3 mLs (2.5 mg total) by nebulization every 6 (six) hours as needed for wheezing or shortness of breath. 08/19/17   Dorena Dew, FNP  amitriptyline (ELAVIL) 25 MG tablet Take 1 tablet (25 mg total) by mouth at bedtime. Patient taking differently: Take 25 mg by mouth at bedtime as needed (headaches).  11/22/17   Lanae Boast, [place]  amoxicillin (AMOXIL) 500 MG capsule Take 1 capsule (500 mg total) by mouth 3 (three) times daily. 12/06/17   Lanae Boast, FNP  Azilsartan-Chlorthalidone (EDARBYCLOR) 40-25 MG TABS Take 1 tablet by mouth daily. 12/04/17   Lanae Boast, FNP  Black Elderberry,Berry-Flower, 575 MG CAPS Take 1 capsule by mouth daily.    [provider]  cetirizine (ZYRTEC) 10 MG tablet Take 1 tablet (10 mg total) by mouth daily. 04/08/17   Dorena Dew, FNP  Cinnamon 500 MG capsule Take 500 mg by mouth daily.    [provider]  Dexlansoprazole (DEXILANT) 30 MG capsule Take 1 capsule (30 mg total) by mouth daily. Take on empty stomach 12/24/17   Lanae Boast, FNP  EASY COMFORT PEN NEEDLES 32G X 4 MM MISC use as directed test 2 times daily 05/14/17   [provider]  Fluticasone-Salmeterol  (ADVAIR DISKUS) 100-50 MCG/DOSE AEPB Inhale 1 puff into the lungs every 12 (twelve) hours. 11/22/17   Lanae Boast, FNP  HYDROcodone-acetaminophen (NORCO/VICODIN) 5-325 MG tablet Take 1 tablet by mouth every 6 (six) hours as needed for severe pain. 11/23/17   Caccavale, Sophia, PA-C  Insulin Glargine (LANTUS SOLOSTAR) 100 UNIT/ML Solostar Pen Inject 40 Units into the skin daily. 12/06/17   Lanae Boast, FNP  Insulin Pen Needle (PEN NEEDLES) 32G X 5 MM MISC 100 each by Does not apply route 2 (two) times daily. 05/14/17   Dorena Dew, FNP  Magnesium Citrate 100 MG TABS Take 100 mg by mouth every evening.    [provider]  meclizine (ANTIVERT) 12.5 MG tablet Take 1 tablet (12.5 mg total) by mouth 3 (three) times daily as needed for dizziness or nausea. 12/29/17   Wieters, Hallie C, PA-C  meloxicam (MOBIC) 15 MG tablet Take 15 mg by mouth daily.    [provider]  metFORMIN (GLUCOPHAGE) 500 MG tablet Take 1 tablet (500 mg total) by mouth 2 (two) times daily with a meal. 11/22/17 12/22/17  Lanae Boast, FNP  mometasone (NASONEX) 50 MCG/ACT nasal spray Place 2 sprays into the nose daily. 04/08/17   Dorena Dew, FNP  montelukast (SINGULAIR) 10 MG tablet Take 1 tablet (10 mg total) by mouth daily. 11/22/17   Lanae Boast, FNP  Multiple Vitamins-Minerals (MULTIVITAMIN ADULT PO) Take 1 tablet by mouth daily.    [provider]  naproxen (NAPROSYN) 500 MG tablet Take 1 tablet (500 mg total) by mouth 2 (two) times daily as needed for headache. 12/29/17   Wieters, Hallie C, PA-C  norgestimate-ethinyl estradiol (ORTHO-CYCLEN,SPRINTEC,PREVIFEM) 0.25-35 MG-MCG tablet Take 1 tablet by mouth daily. 05/03/17 06/18/23  Scot Jun, FNP  ondansetron (ZOFRAN ODT) 4 MG disintegrating tablet Take 1 tablet (4 mg total) by mouth every 8 (eight) hours as needed for nausea or vomiting. 11/23/17   Caccavale, Sophia, PA-C  predniSONE (STERAPRED UNI-PAK 21 TAB) 10 MG (21) TBPK tablet Per written  instructions for 6 days tapered 08/19/17   Dorena Dew, FNP  promethazine (PHENERGAN) 12.5 MG tablet Take 1 tablet (12.5 mg total) by mouth every 8 (eight) hours as needed for nausea or vomiting. 12/06/17   Lanae Boast, FNP  Psyllium (FIBER) 0.52 g CAPS Take 1 capsule by mouth daily.    [provider]  rizatriptan (MAXALT-MLT) 10 MG disintegrating tablet Take 1 tablet (10 mg total) by mouth as needed for migraine. May repeat in 2 hours if needed.  Max 30 mg/24 h 09/17/17   Dorena Dew, FNP  sitaGLIPtin (JANUVIA) 100 MG  tablet Take 1 tablet (100 mg total) by mouth daily. 12/04/17   Lanae Boast, FNP    Family History Family History  Problem Relation Age of Onset  . Diabetes Father   . Hyperlipidemia Father   . Hypertension Father   . Heart disease Father   . Kidney disease Father        Developed after 2nd CABG  . Diabetes Paternal Grandfather   . Heart disease Paternal Grandfather   . Hyperlipidemia Paternal Grandfather   . Hypertension Paternal Grandfather   . Kidney disease Paternal Grandfather     Social History Social History   Tobacco Use  . Smoking status: Never Smoker  . Smokeless tobacco: Never Used  Substance Use Topics  . Alcohol use: No  . Drug use: No     Allergies   Corn-containing products; Honey; Mango flavor; and Victoza [liraglutide]   Review of Systems Review of Systems  Constitutional: Positive for fatigue. Negative for fever.  HENT: Positive for sinus pressure. Negative for congestion and sore throat.   Eyes: Negative for photophobia, pain and visual disturbance.  Respiratory: Negative for cough and shortness of breath.   Cardiovascular: Negative for chest pain.  Gastrointestinal: Positive for nausea. Negative for abdominal pain and vomiting.  Genitourinary: Negative for decreased urine volume and hematuria.  Musculoskeletal: Positive for neck pain. Negative for myalgias and neck stiffness.  Neurological: Positive for  dizziness and headaches. Negative for syncope, facial asymmetry, speech difficulty, weakness, light-headedness and numbness.     Physical Exam Triage Vital Signs ED Triage Vitals [12/29/17 1039]  Enc Vitals Group     BP 127/89     Pulse Rate 82     Resp 20     Temp 97.9 F (36.6 C)     Temp Source Oral     SpO2 95 %     Weight      Height      Head Circumference      Peak Flow      Pain Score      Pain Loc      Pain Edu?      Excl. in [place]?    No data found.  Updated Vital Signs BP 127/89 (BP Location: Left Arm)   Pulse 82   Temp 97.9 F (36.6 C) (Oral)   Resp 20   SpO2 95%   Visual Acuity Right Eye Distance:   Left Eye Distance:   Bilateral Distance:    Right Eye Near:   Left Eye Near:    Bilateral Near:     Physical Exam  Constitutional: She appears well-developed and well-nourished. No distress.  HENT:  Head: Normocephalic and atraumatic.  Bilateral ears without tenderness to palpation of external auricle, tragus and mastoid, EAC's without erythema or swelling, TM's with good bony landmarks and cone of light. Non erythematous.  Nasal mucosa nonerythematous, turbinates not swollen  Oral mucosa pink and moist, no tonsillar enlargement or exudate. Posterior pharynx patent and nonerythematous, no uvula deviation or swelling. Normal phonation.  Eyes: Pupils are equal, round, and reactive to light. Conjunctivae and EOM are normal.  Neck: Neck supple.  Cardiovascular: Normal rate and regular rhythm.  No murmur heard. Pulmonary/Chest: Effort normal and breath sounds normal. No respiratory distress.  Breathing comfortably at rest, CTABL, no wheezing, rales or other adventitious sounds auscultated  Abdominal: Soft. There is no tenderness.  Musculoskeletal: She exhibits no edema.  Neurological: She is alert.  Patient A&O x3, cranial nerves II-XII grossly intact, strength  at shoulders, hips and knees 5/5, equal bilaterally, patellar reflex 2+ bilaterally. Normal  Finger to nose, RAM and heel to shin. Negative Romberg and Pronator Drift. Gait without abnormality.  Skin: Skin is warm and dry.  Psychiatric: She has a normal mood and affect.  Nursing note and vitals reviewed.    UC Treatments / Results  Labs (all labs ordered are listed, but only abnormal results are displayed) Labs Reviewed - No data to display  EKG None  Radiology No results found.  Procedures Procedures (including critical care time)  Medications Ordered in UC Medications  ketorolac (TORADOL) injection 60 mg (has no administration in time range)  metoCLOPramide (REGLAN) injection 5 mg (has no administration in time range)  dexamethasone (DECADRON) injection 10 mg (has no administration in time range)    Initial Impression / Assessment and Plan / UC Course  I have reviewed the triage vital signs and the nursing notes.  Pertinent labs & imaging results that were available during my care of the patient were reviewed by me and considered in my medical decision making (see chart for details).     Patient with headache, gradual onset, vital signs stable, no red flags, no focal neuro deficits, likely tension type versus migraine headache, possible underlying sinus inflammation, will provide patient with Toradol, Decadron and Reglan in clinic today.  Will send home with Naprosyn and meclizine given her nausea and dizziness, has not previously done well with Zofran.  Hold off on robotics for now she does not have any urinary symptoms.  Discussed increase in blood sugars with the Decadron.  Discussed sedation regarding meclizine.  Follow-up if symptoms not improving, headache persisting, worsening, developing new symptoms with headache, vision changes, difficulty speaking, weakness, syncope.Discussed strict return precautions. Patient verbalized understanding and is agreeable with plan.  Final Clinical Impressions(s) / UC Diagnoses   Final diagnoses:  Acute non intractable  tension-type headache     Discharge Instructions     We gave you a shot of Toradol, Decadron and Reglan today in clinic, these should begin working approximately 30 to 40 minutes  Please continue to use Naprosyn as needed for further headache management at home  You may try meclizine as needed for your nausea and dizziness, this will cause drowsiness, please do not drive afterward, please only take at home or at bedtime  Please follow-up here in emergency room if headache persisting, worsening, developing changes in vision, weakness, lightheadedness, difficulty speaking, feeling off balance   ED Prescriptions    Medication Sig Dispense Auth. Provider   naproxen (NAPROSYN) 500 MG tablet Take 1 tablet (500 mg total) by mouth 2 (two) times daily as needed for headache. 30 tablet Wieters, Hallie C, PA-C   meclizine (ANTIVERT) 12.5 MG tablet Take 1 tablet (12.5 mg total) by mouth 3 (three) times daily as needed for dizziness or nausea. 10 tablet Wieters, [place] C, PA-C     Controlled Substance Prescriptions [place] Controlled Substance Registry consulted? Not Applicable   Janith Lima, [place] 12/29/17 1127

## 2017-12-29 NOTE — Discharge Instructions (Addendum)
We gave you a shot of Toradol, Decadron and Reglan today in clinic, these should begin working approximately 30 to 40 minutes  Please continue to use Naprosyn as needed for further headache management at home  You may try meclizine as needed for your nausea and dizziness, this will cause drowsiness, please do not drive afterward, please only take at home or at bedtime  Please follow-up here in emergency room if headache persisting, worsening, developing changes in vision, weakness, lightheadedness, difficulty speaking, feeling off balance

## 2017-12-29 NOTE — ED Triage Notes (Signed)
Pt presents with an ongoing unrelieved headache that radiates down to her neck and makes her face and eyes hurt; pt also complains of dizziness, nausea and sensitivity to light.

## 2017-12-30 MED FILL — NAPROXEN 500 MG TABLET: 500 | 15 days supply | Qty: 30 | Fill #0

## 2018-01-13 ENCOUNTER — Encounter: Payer: Self-pay | Admitting: *Deleted

## 2018-01-20 ENCOUNTER — Other Ambulatory Visit: Payer: Self-pay

## 2018-01-20 DIAGNOSIS — J4530 Mild persistent asthma, uncomplicated: Secondary | ICD-10-CM

## 2018-01-20 MED ORDER — FLUTICASONE-SALMETEROL 100-50 MCG/DOSE IN AEPB: 1.0000 | INHALATION_SPRAY | Freq: Two times a day (BID) | RESPIRATORY_TRACT | 2 refills | Status: DC

## 2018-01-31 ENCOUNTER — Ambulatory Visit: Payer: Medicaid Other

## 2018-02-06 MED FILL — AMITRIPTYLINE HCL 25 MG TAB: 25 | 30 days supply | Qty: 30 | Fill #2

## 2018-02-06 MED FILL — MONTELUKAST SOD 10 MG TAB: 10 | 30 days supply | Qty: 30 | Fill #0

## 2018-02-10 ENCOUNTER — Encounter: Payer: Self-pay | Admitting: Obstetrics & Gynecology

## 2018-02-10 ENCOUNTER — Ambulatory Visit (INDEPENDENT_AMBULATORY_CARE_PROVIDER_SITE_OTHER): Payer: Self-pay | Admitting: Obstetrics & Gynecology

## 2018-02-10 VITALS — BP 132/86 | HR 97 | Ht 65.0 in | Wt 265.0 lb

## 2018-02-10 DIAGNOSIS — Z3009 Encounter for other general counseling and advice on contraception: Secondary | ICD-10-CM

## 2018-02-10 DIAGNOSIS — E282 Polycystic ovarian syndrome: Secondary | ICD-10-CM

## 2018-02-10 DIAGNOSIS — N911 Secondary amenorrhea: Secondary | ICD-10-CM

## 2018-02-10 DIAGNOSIS — N939 Abnormal uterine and vaginal bleeding, unspecified: Secondary | ICD-10-CM

## 2018-02-10 LAB — POCT PREGNANCY, URINE: PREG TEST UR: NEGATIVE

## 2018-02-10 NOTE — Progress Notes (Signed)
GYNECOLOGY OFFICE VISIT NOTE  History:  42 y.o. [id number] with history of HTN and T2DM here today as a referral for evaluation of AUB in the setting of previously diagnosed PCOS.  She was treated with OCPs and Metformin as prescribed by her PCP; but reported episodes of intermenstrual bleeding in the past. Also reported episodic painful ovarian cysts with associated dyspareunia, not currently. She has not been on OCPs since August 2019 or had any bleeding since August 2019, wonders if she is pregnant. Despite not having any current symptoms, she wants to discuss intervention to prevent these symptoms that will also be effective contraception since she stopped the OCPs.  In the past, Dr. Ilda Basset had discussed the progestin IUD or other progestin modalities. She denies any current abnormal vaginal discharge, bleeding, pelvic pain or other concerns.   Past Medical History:  Diagnosis Date  . Allergy    Foods:  mango, honey, corn.  Allergic to "everything tested"  15 years ago in [place]  . Asthma age 65 yo   Triggers:  cold weather, exercise, allergens, anxiety, URIs  . Cholelithiasis age 14 yo   Asymptomatic  . Diabetes mellitus 2012   Was diagnosed 2 years earlier with PCOS  . Elevated liver enzymes 09/13/2016  . Hypertension 2015  . Kidney stones   . Migraines 2009  . Morbid obesity ([place])   . PCOS (polycystic ovarian syndrome) 2010    Past Surgical History:  Procedure Laterality Date  . COLPOSCOPY N/A     The following portions of the patient's history were reviewed and updated as appropriate: allergies, current medications, past family history, past medical history, past social history, past surgical history and problem list.   Health Maintenance:  LGSIL pap and positive HRHPV in 05/2017; benign colposcopy pathology and ECC in 10/04/2017. Normal mammogram on 2/05/28/2017.   Review of Systems:  Pertinent items noted in HPI and remainder of comprehensive ROS otherwise  negative.  Objective:  Physical Exam BP 132/86   Pulse 97   Ht 5\' 5"  (1.651 m)   Wt 265 lb (120.2 kg)   LMP 11/25/2017 (Exact Date)   BMI 44.10 kg/m  CONSTITUTIONAL: Well-developed, well-nourished female in no acute distress.  HEENT:  Normocephalic, atraumatic. External right and left ear normal. No scleral icterus.  NECK: Normal range of motion, supple, no masses noted on observation SKIN: Skin is warm and dry. No rash noted. Not diaphoretic. No erythema. No pallor. MUSCULOSKELETAL: Normal range of motion. No edema noted. NEUROLOGIC: Alert and oriented to person, place, and time. Normal muscle tone coordination. No cranial nerve deficit noted. PSYCHIATRIC: Normal mood and affect. Normal behavior. Normal judgment and thought content. CARDIOVASCULAR: Normal heart rate noted RESPIRATORY: Effort and breath sounds normal, no problems with respiration noted ABDOMEN: No apparent distention noted.   PELVIC: Deferred  Labs and Imaging Results for orders placed or performed in visit on 02/10/18 (from the past 168 hour(s))  Pregnancy, urine POC   Collection Time: 02/10/18  8:33 AM  Result Value Ref Range   Preg Test, Ur NEGATIVE NEGATIVE    Assessment & Plan:  1. Secondary amenorrhea 2. PCOS (polycystic ovarian syndrome) - Follicle stimulating hormone - Beta hCG quant (ref lab) - TSH+Prl+TestT+TestF+17OHP Will order secondary amenorrhea evaluation labs today. No need for imaging at this point given no current pain, she was told to let us know if pain recurs.  3. General counseling and advice on female contraception Recommended progestin IUD for contraception; this will also treat AUB and  provide endometrial protection.  She is hesitant to do this, worried about cramping and irregular bleeding. She was told these symptoms were normal for first few months, then her periods will be light or non-existent.  Discussed benefits of this modality in detail; also discussed concern about the use  of estrogen containing pills in the setting of HTN and associated risk of venous thromboembolism (VTE). All questions answered. Information given to her to review at home. She will return soon for possible placement and further discussion.  Please refer to After Visit Summary for other counseling recommendations.   Return in about 1 month (around 03/13/2018) for Possible IUD placement/Follow up with Dr. Ilda Basset.   Total face-to-face time with patient: 25 minutes.  Over 50% of encounter was spent on counseling and coordination of care.   Verita Schneiders, MD, [place] for [employer], [place]

## 2018-02-10 NOTE — Patient Instructions (Signed)
Return to clinic for any scheduled appointments or for any gynecologic concerns as needed.    Intrauterine Device Insertion An intrauterine device (IUD) is a medical device that gets inserted into the uterus to prevent pregnancy. It is a small, T-shaped device that has one or two nylon strings hanging down from it. The strings hang out of the lower part of the uterus (cervix) to allow for future IUD removal. There are two types of IUDs available:  Copper IUD. This type of IUD has copper wire wrapped around it. Copper makes the uterus and fallopian tubes produce a fluid that kills sperm. A copper IUD may last up to 10 years.  Hormone IUD. This type of IUD is made of plastic and contains the hormone progestin (synthetic progesterone). The hormone thickens mucus in the cervix and prevents sperm from entering the uterus. It also thins the uterine lining to prevent implantation of a fertilized egg. The hormone can weaken or kill the sperm that get into the uterus. A hormone IUD may last 3-5 years.  Tell a health care provider about:  Any allergies you have.  All medicines you are taking, including vitamins, herbs, eye drops, creams, and over-the-counter medicines.  Any problems you or family members have had with anesthetic medicines.  Any blood disorders you have.  Any surgeries you have had.  Any medical conditions you have, including any STIs (sexually transmitted infections) you may have.  Whether you are pregnant or may be pregnant. What are the risks? Generally, this is a safe procedure. However, problems may occur, including:  Infection.  Bleeding.  Allergic reactions to medicines.  Accidental puncture (perforation) of the uterus, or damage to other structures or organs.  Accidental placement of the IUD either in the muscle layer of the uterus (myometrium) or outside the uterus.  The IUD falling out of the uterus (expulsion). This is more common among women who have recently  had a child.  Pregnancy that happens in the fallopian tube (ectopic pregnancy).  Infection of the uterus and fallopian tubes (pelvic inflammatory disease).  What happens before the procedure?  Schedule the IUD insertion for when you will have your menstrual period or right after, to make sure you are not pregnant. Placement of the IUD is better tolerated shortly after a menstrual cycle.  Follow instructions from your health care provider about eating or drinking restrictions.  Ask your health care provider about changing or stopping your regular medicines. This is especially important if you are taking diabetes medicines or blood thinners.  You may get a pain reliever to take before the procedure.  You may have tests for: ? Pregnancy. A pregnancy test involves having a urine sample taken. ? STIs. Placing an IUD in someone who has an STI can make the infection worse. ? Cervical cancer. You may have a Pap test to check for this type of cancer. This means collecting cells from your cervix to be examined under a microscope.  You may have a physical exam to determine the size and position of your uterus. The procedure may vary among health care providers and hospitals. What happens during the procedure?  A tool (speculum) will be placed in your vagina and widened so that your health care provider can see your cervix.  Medicine may be applied to your cervix to help lower your risk of infection (antiseptic medicine).  You may be given an anesthetic medicine to numb each side of your cervix (intracervical block or paracervical block). This medicine  is usually given by an injection into the cervix.  A tool (uterine sound) will be inserted into your uterus to determine the length of your uterus and the direction that your uterus may be tilted.  A slim instrument or tube (IUD inserter) that holds the IUD will be inserted into your vagina, through your cervical canal, and into your  uterus.  The IUD will be placed in the uterus, and the IUD inserter will be removed.  The strings that are attached to the IUD will be trimmed so that they lie just below the cervix. The procedure may vary among health care providers and hospitals. What happens after the procedure?  You may have bleeding after the procedure. This is normal. It varies from light bleeding (spotting) for a few days to menstrual-like bleeding.  You may have cramping and pain.  You may feel dizzy or light-headed.  You may have lower back pain. Summary  An intrauterine device (IUD) is a small, T-shaped device that has one or two nylon strings hanging down from it.  Two types of IUDs are available. You may have a copper IUD or a hormone IUD.  Schedule the IUD insertion for when you will have your menstrual period or right after, to make sure you are not pregnant. Placement of the IUD is better tolerated shortly after a menstrual cycle.  You may have bleeding after the procedure. This is normal. It varies from light spotting for a few days to menstrual-like bleeding. This information is not intended to replace advice given to you by your health care provider. Make sure you discuss any questions you have with your health care provider. Document Released: 12/06/2010 Document Revised: 02/29/2016 Document Reviewed: 02/29/2016 Elsevier Interactive Patient Education  2017 [employer].

## 2018-02-13 LAB — FOLLICLE STIMULATING HORMONE: FSH: 10.2 m[IU]/mL

## 2018-02-13 LAB — TSH+PRL+TESTT+TESTF+17OHP
17 HYDROXYPROGESTERONE: 32 ng/dL
Prolactin: 12.8 ng/mL (ref 4.8–23.3)
TESTOSTERONE, TOTAL: 31.5 ng/dL
TSH: 5.09 u[IU]/mL — ABNORMAL HIGH (ref 0.450–4.500)
Testosterone, Free: 1 pg/mL (ref 0.0–4.2)

## 2018-02-13 LAB — BETA HCG QUANT (REF LAB): hCG Quant: <1 m[IU]/mL

## 2018-02-14 ENCOUNTER — Ambulatory Visit: Payer: Medicaid Other

## 2018-02-14 NOTE — Addendum Note (Signed)
Addended by: Verita Schneiders A on: 02/14/2018 01:07 AM   Modules accepted: Orders

## 2018-02-17 ENCOUNTER — Telehealth: Payer: Self-pay | Admitting: General Practice

## 2018-02-17 NOTE — Telephone Encounter (Signed)
[facility] cannot add labs on as there was not enough blood. Called patient to come in for draw. Patient verbalized understanding & states she can come Wednesday at 5:30pm. Patient had no questions.

## 2018-02-17 NOTE — Telephone Encounter (Signed)
-----   Message from Mallie Darting sent at 02/17/2018  2:33 PM EDT -----   ----- Message ----- From: Osborne Oman, MD Sent: 02/14/2018   1:03 AM EDT To: [facility] Admin Pool  Need to check free T4 and T3, call [facility] to add this on

## 2018-02-19 ENCOUNTER — Other Ambulatory Visit: Payer: Medicaid Other

## 2018-02-19 DIAGNOSIS — N911 Secondary amenorrhea: Secondary | ICD-10-CM

## 2018-02-20 LAB — T4, FREE: Free T4: 1.14 ng/dL (ref 0.82–1.77)

## 2018-02-20 LAB — T3, FREE: T3, Free: 2.7 pg/mL (ref 2.0–4.4)

## 2018-03-10 ENCOUNTER — Ambulatory Visit: Payer: Medicaid Other | Admitting: Family Medicine

## 2018-03-11 MED FILL — TRUEPLUS PEN NDL 32GX5/32": 32G X 4 MM | 50 days supply | Qty: 100 | Fill #0

## 2018-03-11 MED FILL — ?METFORMIN HCL 1000MG TABS: 1000 | 30 days supply | Qty: 60 | Fill #3

## 2018-03-11 MED FILL — AMITRIPTYLINE HCL 25 MG TAB: 25 | 30 days supply | Qty: 30 | Fill #3

## 2018-03-11 MED FILL — MONTELUKAST SOD 10 MG TAB: 10 | 30 days supply | Qty: 30 | Fill #1

## 2018-03-11 MED FILL — TRUEPLUS PEN NDL 32GX5/32: 32G X 4 MM | 50 days supply | Qty: 100 | Fill #0

## 2018-03-14 ENCOUNTER — Ambulatory Visit: Payer: Medicaid Other | Admitting: Obstetrics and Gynecology

## 2018-03-14 ENCOUNTER — Ambulatory Visit (INDEPENDENT_AMBULATORY_CARE_PROVIDER_SITE_OTHER): Payer: Self-pay | Admitting: Family Medicine

## 2018-03-14 VITALS — BP 143/85 | HR 78 | Temp 98.9°F | Resp 16 | Ht 65.0 in | Wt 270.0 lb

## 2018-03-14 DIAGNOSIS — I10 Essential (primary) hypertension: Secondary | ICD-10-CM

## 2018-03-14 DIAGNOSIS — E119 Type 2 diabetes mellitus without complications: Secondary | ICD-10-CM

## 2018-03-14 DIAGNOSIS — Z794 Long term (current) use of insulin: Secondary | ICD-10-CM

## 2018-03-14 LAB — POCT GLYCOSYLATED HEMOGLOBIN (HGB A1C): Hemoglobin A1C: 11 % — AB (ref 4.0–5.6)

## 2018-03-14 LAB — GLUCOSE, POCT (MANUAL RESULT ENTRY): POC Glucose: 290 mg/dl — AB (ref 70–99)

## 2018-03-14 LAB — POCT URINALYSIS DIPSTICK
Bilirubin, UA: NEGATIVE
Blood, UA: NEGATIVE
Glucose, UA: POSITIVE — AB
Ketones, UA: NEGATIVE
Leukocytes, UA: NEGATIVE
Nitrite, UA: NEGATIVE
Protein, UA: NEGATIVE
Spec Grav, UA: 1.015 (ref 1.010–1.025)
Urobilinogen, UA: 0.2 E.U./dL
pH, UA: 6.5 (ref 5.0–8.0)

## 2018-03-14 NOTE — Patient Instructions (Signed)
Have a happy holiday season.    Diabetes Mellitus and Nutrition When you have diabetes (diabetes mellitus), it is very important to have healthy eating habits because your blood sugar (glucose) levels are greatly affected by what you eat and drink. Eating healthy foods in the appropriate amounts, at about the same times every day, can help you:  Control your blood glucose.  Lower your risk of heart disease.  Improve your blood pressure.  Reach or maintain a healthy weight.  Every person with diabetes is different, and each person has different needs for a meal plan. Your health care provider may recommend that you work with a diet and nutrition specialist (dietitian) to make a meal plan that is best for you. Your meal plan may vary depending on factors such as:  The calories you need.  The medicines you take.  Your weight.  Your blood glucose, blood pressure, and cholesterol levels.  Your activity level.  Other health conditions you have, such as heart or kidney disease.  How do carbohydrates affect me? Carbohydrates affect your blood glucose level more than any other type of food. Eating carbohydrates naturally increases the amount of glucose in your blood. Carbohydrate counting is a method for keeping track of how many carbohydrates you eat. Counting carbohydrates is important to keep your blood glucose at a healthy level, especially if you use insulin or take certain oral diabetes medicines. It is important to know how many carbohydrates you can safely have in each meal. This is different for every person. Your dietitian can help you calculate how many carbohydrates you should have at each meal and for snack. Foods that contain carbohydrates include:  Bread, cereal, rice, pasta, and crackers.  Potatoes and corn.  Peas, beans, and lentils.  Milk and yogurt.  Fruit and juice.  Desserts, such as cakes, cookies, ice cream, and candy.  How does alcohol affect me? Alcohol  can cause a sudden decrease in blood glucose (hypoglycemia), especially if you use insulin or take certain oral diabetes medicines. Hypoglycemia can be a life-threatening condition. Symptoms of hypoglycemia (sleepiness, dizziness, and confusion) are similar to symptoms of having too much alcohol. If your health care provider says that alcohol is safe for you, follow these guidelines:  Limit alcohol intake to no more than 1 drink per day for nonpregnant women and 2 drinks per day for men. One drink equals 12 oz of beer, 5 oz of wine, or 1 oz of hard liquor.  Do not drink on an empty stomach.  Keep yourself hydrated with water, diet soda, or unsweetened iced tea.  Keep in mind that regular soda, juice, and other mixers may contain a lot of sugar and must be counted as carbohydrates.  What are tips for following this plan? Reading food labels  Start by checking the serving size on the label. The amount of calories, carbohydrates, fats, and other nutrients listed on the label are based on one serving of the food. Many foods contain more than one serving per package.  Check the total grams (g) of carbohydrates in one serving. You can calculate the number of servings of carbohydrates in one serving by dividing the total carbohydrates by 15. For example, if a food has 30 g of total carbohydrates, it would be equal to 2 servings of carbohydrates.  Check the number of grams (g) of saturated and trans fats in one serving. Choose foods that have low or no amount of these fats.  Check the number of milligrams (  mg) of sodium in one serving. Most people should limit total sodium intake to less than 2,300 mg per day.  Always check the nutrition information of foods labeled as "low-fat" or "nonfat". These foods may be higher in added sugar or refined carbohydrates and should be avoided.  Talk to your dietitian to identify your daily goals for nutrients listed on the label. Shopping  Avoid buying canned,  premade, or processed foods. These foods tend to be high in fat, sodium, and added sugar.  Shop around the outside edge of the grocery store. This includes fresh fruits and vegetables, bulk grains, fresh meats, and fresh dairy. Cooking  Use low-heat cooking methods, such as baking, instead of high-heat cooking methods like deep frying.  Cook using healthy oils, such as olive, canola, or sunflower oil.  Avoid cooking with butter, cream, or high-fat meats. Meal planning  Eat meals and snacks regularly, preferably at the same times every day. Avoid going long periods of time without eating.  Eat foods high in fiber, such as fresh fruits, vegetables, beans, and whole grains. Talk to your dietitian about how many servings of carbohydrates you can eat at each meal.  Eat 4-6 ounces of lean protein each day, such as lean meat, chicken, fish, eggs, or tofu. 1 ounce is equal to 1 ounce of meat, chicken, or fish, 1 egg, or 1/4 cup of tofu.  Eat some foods each day that contain healthy fats, such as avocado, nuts, seeds, and fish. Lifestyle   Check your blood glucose regularly.  Exercise at least 30 minutes 5 or more days each week, or as told by your health care provider.  Take medicines as told by your health care provider.  Do not use any products that contain nicotine or tobacco, such as cigarettes and e-cigarettes. If you need help quitting, ask your health care provider.  Work with a Social worker or diabetes educator to identify strategies to manage stress and any emotional and social challenges. What are some questions to ask my health care provider?  Do I need to meet with a diabetes educator?  Do I need to meet with a dietitian?  What number can I call if I have questions?  When are the best times to check my blood glucose? Where to find more information:  American Diabetes Association: diabetes.org/food-and-fitness/food  Academy of Nutrition and Dietetics:  PokerClues.dk  [employer] of Diabetes and Digestive and Kidney Diseases (NIH): ContactWire.be Summary  A healthy meal plan will help you control your blood glucose and maintain a healthy lifestyle.  Working with a diet and nutrition specialist (dietitian) can help you make a meal plan that is best for you.  Keep in mind that carbohydrates and alcohol have immediate effects on your blood glucose levels. It is important to count carbohydrates and to use alcohol carefully. This information is not intended to replace advice given to you by your health care provider. Make sure you discuss any questions you have with your health care provider. Document Released: 01/04/2005 Document Revised: 05/14/2016 Document Reviewed: 05/14/2016 Elsevier Interactive Patient Education  [employer].

## 2018-03-14 NOTE — Progress Notes (Signed)
[facility]   Progress Note: General Provider: Lanae Boast, FNP  SUBJECTIVE:   Lori Shea is a 42 y.o. female who  has a past medical history of Allergy, Asthma (age 59 yo), Cholelithiasis (age 8 yo), Diabetes mellitus (2012), Elevated liver enzymes (09/13/2016), Hypertension (2015), Kidney stones, Migraines (2009), Morbid obesity ([place]), and PCOS (polycystic ovarian syndrome) (2010).. Patient presents today for Hypertension and Diabetes Patient states that her FBS are 150-180. States that there are some readings in the 200s, depending on what she had the night before.  24 hour diet recall: coffee with sugar free creamer, breakfast: 1/2 bagel, lunch: 4 tacos, dinner: chicken chipotle sandwich from [employer]  Snack: pizza around 11pm. Water and diet mountain dew.  Exercise: not exercising.   Review of Systems  Constitutional: Negative.   HENT: Negative.   Eyes: Negative.   Respiratory: Negative.   Cardiovascular: Negative.   Gastrointestinal: Negative.   Genitourinary: Negative.   Musculoskeletal: Negative.   Skin: Negative.   Neurological: Negative.   Psychiatric/Behavioral: Negative.      OBJECTIVE: BP (!) 143/85 (BP Location: Right Arm, Patient Position: Sitting, Cuff Size: Large)   Pulse 78   Temp 98.9 F (37.2 C) (Oral)   Resp 16   Ht 5\' 5"  (1.651 m)   Wt 270 lb (122.5 kg)   LMP 03/07/2018   SpO2 98%   BMI 44.93 kg/m   Wt Readings from Last 3 Encounters:  03/14/18 270 lb (122.5 kg)  02/10/18 265 lb (120.2 kg)  12/06/17 262 lb (118.8 kg)     Physical Exam  Constitutional: She is oriented to person, place, and time. She appears well-developed and well-nourished. No distress.  HENT:  Head: Normocephalic and atraumatic.  Eyes: Pupils are equal, round, and reactive to light. Conjunctivae and EOM are normal.  Neck: Normal range of motion.  Cardiovascular: Normal rate, regular rhythm, normal heart sounds and  intact distal pulses.  Pulmonary/Chest: Effort normal and breath sounds normal. No respiratory distress.  Abdominal: Soft. Bowel sounds are normal. She exhibits no distension.  Musculoskeletal: Normal range of motion.  Neurological: She is alert and oriented to person, place, and time.  Skin: Skin is warm and dry.  Psychiatric: She has a normal mood and affect. Her behavior is normal. Thought content normal.  Nursing note and vitals reviewed.   ASSESSMENT/PLAN: 1. Type 2 diabetes mellitus without complication, with long-term current use of insulin (HCC) Patient taking Lanuts 40 units BID. Would like a referral to endocrinology for further evaluation. She does not want to increase insulin at the current time. She would like to work on diet and exercise.  - HgB A1c - Glucose (CBG) - Ambulatory referral to Endocrinology  2. Essential hypertension The current medical regimen is effective;  continue present plan and medications. - Urinalysis Dipstick - Comprehensive metabolic panel  3. Morbid obesity ([place]) We discussed lowering carbohydrates, keeping a food journal and increasing activity. Discussed eating healthy over the holiday season.      Return in about 3 months (around 06/14/2018).  The patient was given clear instructions to go to ER or return to medical center if symptoms do not improve, worsen or new problems develop. The patient verbalized understanding and agreed with plan of care.   Ms. Doug Sou. Nathaneil Canary, FNP-BC [facility] [place], [place] [address] [phone]     This note has been created with Dragon speech recognition  Engineer, drilling. Any transcriptional errors are unintentional.

## 2018-03-15 ENCOUNTER — Encounter (HOSPITAL_COMMUNITY): Payer: Self-pay | Admitting: *Deleted

## 2018-03-15 ENCOUNTER — Telehealth: Payer: Self-pay | Admitting: Family Medicine

## 2018-03-15 ENCOUNTER — Emergency Department (HOSPITAL_COMMUNITY)
Admission: EM | Admit: 2018-03-15 | Discharge: 2018-03-15 | Disposition: A | Discharge status: Home / Self Care | Payer: Medicaid Other | Attending: Emergency Medicine | Admitting: Emergency Medicine

## 2018-03-15 DIAGNOSIS — R112 Nausea with vomiting, unspecified: Secondary | ICD-10-CM | POA: Insufficient documentation

## 2018-03-15 DIAGNOSIS — M791 Myalgia, unspecified site: Secondary | ICD-10-CM | POA: Insufficient documentation

## 2018-03-15 DIAGNOSIS — I10 Essential (primary) hypertension: Secondary | ICD-10-CM | POA: Insufficient documentation

## 2018-03-15 DIAGNOSIS — E119 Type 2 diabetes mellitus without complications: Secondary | ICD-10-CM | POA: Insufficient documentation

## 2018-03-15 DIAGNOSIS — J45909 Unspecified asthma, uncomplicated: Secondary | ICD-10-CM | POA: Insufficient documentation

## 2018-03-15 DIAGNOSIS — Z79899 Other long term (current) drug therapy: Secondary | ICD-10-CM | POA: Insufficient documentation

## 2018-03-15 DIAGNOSIS — R51 Headache: Secondary | ICD-10-CM | POA: Insufficient documentation

## 2018-03-15 DIAGNOSIS — R6889 Other general symptoms and signs: Secondary | ICD-10-CM

## 2018-03-15 LAB — CBC WITH DIFFERENTIAL/PLATELET
Abs Immature Granulocytes: 0.04 10*3/uL (ref 0.00–0.07)
BASOS PCT: 0 %
Basophils Absolute: 0 10*3/uL (ref 0.0–0.1)
Eosinophils Absolute: 0.1 10*3/uL (ref 0.0–0.5)
Eosinophils Relative: 1 %
HCT: 41.4 % (ref 36.0–46.0)
Hemoglobin: 13.4 g/dL (ref 12.0–15.0)
Immature Granulocytes: 1 %
Lymphocytes Relative: 10 %
Lymphs Abs: 0.8 10*3/uL (ref 0.7–4.0)
MCH: 28.8 pg (ref 26.0–34.0)
MCHC: 32.4 g/dL (ref 30.0–36.0)
MCV: 89 fL (ref 80.0–100.0)
MONOS PCT: 5 %
Monocytes Absolute: 0.4 10*3/uL (ref 0.1–1.0)
NEUTROS ABS: 7.4 10*3/uL (ref 1.7–7.7)
NEUTROS PCT: 83 %
PLATELETS: 251 10*3/uL (ref 150–400)
RBC: 4.65 MIL/uL (ref 3.87–5.11)
RDW: 14 % (ref 11.5–15.5)
WBC: 8.7 10*3/uL (ref 4.0–10.5)
nRBC: 0 % (ref 0.0–0.2)

## 2018-03-15 LAB — COMPREHENSIVE METABOLIC PANEL
ALK PHOS: 81 U/L (ref 38–126)
ALT: 84 IU/L — ABNORMAL HIGH (ref 0–32)
ALT: 63 U/L — ABNORMAL HIGH (ref 0–44)
ANION GAP: 9 (ref 5–15)
AST: 56 IU/L — ABNORMAL HIGH (ref 0–40)
AST: 32 U/L (ref 15–41)
Albumin/Globulin Ratio: 1.6 (ref 1.2–2.2)
Albumin: 3.8 g/dL (ref 3.5–5.5)
Albumin: 3.6 g/dL (ref 3.5–5.0)
Alkaline Phosphatase: 101 IU/L (ref 39–117)
BILIRUBIN TOTAL: 0.8 mg/dL (ref 0.3–1.2)
BUN/Creatinine Ratio: 17 (ref 9–23)
BUN: 11 mg/dL (ref 6–24)
BUN: 14 mg/dL (ref 6–20)
Bilirubin Total: 0.3 mg/dL (ref 0.0–1.2)
CALCIUM: 8.6 mg/dL — AB (ref 8.9–10.3)
CO2: 21 mmol/L (ref 20–29)
CO2: 25 mmol/L (ref 22–32)
Calcium: 9.2 mg/dL (ref 8.7–10.2)
Chloride: 100 mmol/L (ref 96–106)
Chloride: 104 mmol/L (ref 98–111)
Creatinine, Ser: 0.66 mg/dL (ref 0.57–1.00)
Creatinine, Ser: 0.69 mg/dL (ref 0.44–1.00)
GFR calc Af Amer: 125 mL/min/{1.73_m2} (ref >59)
GFR calc non Af Amer: 109 mL/min/{1.73_m2} (ref >59)
GFR calc non Af Amer: >60 mL/min (ref >60)
Globulin, Total: 2.4 g/dL (ref 1.5–4.5)
Glucose, Bld: 222 mg/dL — ABNORMAL HIGH (ref 70–99)
Glucose: 289 mg/dL — ABNORMAL HIGH (ref 65–99)
POTASSIUM: 3.6 mmol/L (ref 3.5–5.1)
Potassium: 4.4 mmol/L (ref 3.5–5.2)
SODIUM: 138 mmol/L (ref 135–145)
Sodium: 135 mmol/L (ref 134–144)
Total Protein: 6.2 g/dL (ref 6.0–8.5)
Total Protein: 6.9 g/dL (ref 6.5–8.1)

## 2018-03-15 LAB — URINALYSIS, ROUTINE W REFLEX MICROSCOPIC
BILIRUBIN URINE: NEGATIVE
GLUCOSE, UA: 50 mg/dL — AB
HGB URINE DIPSTICK: NEGATIVE
Ketones, ur: 5 mg/dL — AB
Leukocytes, UA: NEGATIVE
NITRITE: NEGATIVE
PH: 7 (ref 5.0–8.0)
Protein, ur: NEGATIVE mg/dL
SPECIFIC GRAVITY, URINE: 1.025 (ref 1.005–1.030)

## 2018-03-15 LAB — I-STAT BETA HCG BLOOD, ED (MC, WL, AP ONLY)

## 2018-03-15 LAB — I-STAT CG4 LACTIC ACID, ED: Lactic Acid, Venous: 1.2 mmol/L (ref 0.5–1.9)

## 2018-03-15 LAB — LIPASE, BLOOD: LIPASE: 20 U/L (ref 11–51)

## 2018-03-15 MED ORDER — FAMOTIDINE IN NACL 20-0.9 MG/50ML-% IV SOLN
20.0000 mg | Freq: Once | INTRAVENOUS | Status: AC
  Administered 2018-03-15: 20 mg via INTRAVENOUS
  Filled 2018-03-15: qty 50

## 2018-03-15 MED ORDER — SODIUM CHLORIDE 0.9 % IV BOLUS (SEPSIS): 1000.0000 mL | Freq: Once | INTRAVENOUS | Status: DC

## 2018-03-15 MED ORDER — IBUPROFEN 600 MG PO TABS: 600.0000 mg | ORAL_TABLET | Freq: Four times a day (QID) | ORAL | 0 refills | Status: DC | PRN

## 2018-03-15 MED ORDER — SODIUM CHLORIDE 0.9 % IV SOLN
1000.0000 mL | INTRAVENOUS | Status: DC
  Administered 2018-03-15: 1000 mL via INTRAVENOUS

## 2018-03-15 MED ORDER — KETOROLAC TROMETHAMINE 30 MG/ML IJ SOLN
30.0000 mg | Freq: Once | INTRAMUSCULAR | Status: AC
  Administered 2018-03-15: 30 mg via INTRAVENOUS
  Filled 2018-03-15: qty 1

## 2018-03-15 MED ORDER — METOCLOPRAMIDE HCL 5 MG/ML IJ SOLN
10.0000 mg | Freq: Once | INTRAMUSCULAR | Status: AC
  Administered 2018-03-15: 10 mg via INTRAVENOUS
  Filled 2018-03-15: qty 2

## 2018-03-15 MED ORDER — ONDANSETRON 4 MG PO TBDP: 4.0000 mg | ORAL_TABLET | ORAL | 0 refills | Status: DC | PRN

## 2018-03-15 MED ORDER — DIPHENHYDRAMINE HCL 50 MG/ML IJ SOLN
25.0000 mg | Freq: Once | INTRAMUSCULAR | Status: AC
  Administered 2018-03-15: 25 mg via INTRAVENOUS
  Filled 2018-03-15: qty 1

## 2018-03-15 NOTE — ED Provider Notes (Signed)
[facility] DEPT Provider Note   CSN: [id number] Arrival date & time: 03/15/18  1210     History   Chief Complaint Chief Complaint  Patient presents with  . Emesis  . Generalized Body Aches    HPI Lori Shea is a 42 y.o. female.  HPI Patient reports that she felt well yesterday during the day.  She ate boneless chicken wings from [employer].  Other family members had eaten the same thing and had no adverse effect.  She reports she had been working on [employer] and so she felt achy by the time she went to bed but she thought it was probably muscular soreness.  She woke up in the early morning hours with acute onset of vomiting.  She reports she is been vomiting now all morning.  She reports she has aching in her body in all extremities.  She has generalized headache.  Patient reports that she does have history of migraines and so she took a Maxalt and that has helped the headache some but she continues to feel extremely achy all over and nauseated. Past Medical History:  Diagnosis Date  . Allergy    Foods:  mango, honey, corn.  Allergic to "everything tested"  15 years ago in [place]  . Asthma age 48 yo   Triggers:  cold weather, exercise, allergens, anxiety, URIs  . Cholelithiasis age 76 yo   Asymptomatic  . Diabetes mellitus 2012   Was diagnosed 2 years earlier with PCOS  . Elevated liver enzymes 09/13/2016  . Hypertension 2015  . Kidney stones   . Migraines 2009  . Morbid obesity ([place])   . PCOS (polycystic ovarian syndrome) 2010    Patient Active Problem List   Diagnosis Date Noted  . LGSIL on Pap smear of cervix 07/31/2017  . Alopecia of scalp 05/09/2017  . Pain in left toe(s) 12/27/2016  . Closed avulsion fracture of lateral malleolus of right fibula 12/27/2016  . Morbid obesity ([place]) 09/13/2016  . Elevated liver enzymes 09/13/2016  . Allergy   . Asthma   . Hypertension 04/23/2013  . Diabetes ([place]) 04/23/2010  .  PCOS (polycystic ovarian syndrome) 04/23/2008  . Migraines 04/24/2007    Past Surgical History:  Procedure Laterality Date  . COLPOSCOPY N/A      OB History    Gravida  4   Para  1   Term  1   Preterm      AB  3   Living  1     SAB  3   TAB      Ectopic      Multiple      Live Births               Home Medications    Prior to Admission medications   Medication Sig Start Date End Date Taking? Authorizing Provider  albuterol (PROVENTIL HFA;VENTOLIN HFA) 108 (90 Base) MCG/ACT inhaler Inhale 2 puffs into the lungs every 4 (four) hours as needed for wheezing or shortness of breath. 12/04/17  Yes Lanae Boast, FNP  albuterol (PROVENTIL) (2.5 MG/3ML) 0.083% nebulizer solution Take 3 mLs (2.5 mg total) by nebulization every 6 (six) hours as needed for wheezing or shortness of breath. 08/19/17  Yes Dorena Dew, FNP  amitriptyline (ELAVIL) 25 MG tablet Take 1 tablet (25 mg total) by mouth at bedtime. 11/22/17  Yes Lanae Boast, FNP  Azilsartan-Chlorthalidone (EDARBYCLOR) 40-25 MG TABS Take 1 tablet by mouth daily. 12/04/17  Yes  Lanae Boast, FNP  Black Elderberry,Berry-Flower, 575 MG CAPS Take 1 capsule by mouth daily.   Yes [provider]  cetirizine (ZYRTEC) 10 MG tablet Take 1 tablet (10 mg total) by mouth daily. 04/08/17  Yes Dorena Dew, FNP  Cinnamon 500 MG capsule Take 500 mg by mouth daily.   Yes [provider]  Dexlansoprazole (DEXILANT) 30 MG capsule Take 1 capsule (30 mg total) by mouth daily. Take on empty stomach 12/24/17  Yes Lanae Boast, FNP  Fluticasone-Salmeterol (ADVAIR DISKUS) 100-50 MCG/DOSE AEPB Inhale 1 puff into the lungs every 12 (twelve) hours. 01/20/18  Yes Lanae Boast, FNP  Insulin Glargine (LANTUS SOLOSTAR) 100 UNIT/ML Solostar Pen Inject 40 Units into the skin daily. Patient taking differently: Inject 40 Units into the skin 2 (two) times daily.  12/06/17  Yes Lanae Boast, FNP  Magnesium Citrate 100 MG TABS  Take 100 mg by mouth every evening.   Yes [provider]  meloxicam (MOBIC) 15 MG tablet Take 15 mg by mouth daily.   Yes [provider]  metFORMIN (GLUCOPHAGE) 500 MG tablet Take 1 tablet (500 mg total) by mouth 2 (two) times daily with a meal. 11/22/17 03/15/18 Yes Lanae Boast, FNP  montelukast (SINGULAIR) 10 MG tablet Take 1 tablet (10 mg total) by mouth daily. 11/22/17  Yes Lanae Boast, FNP  Multiple Vitamins-Minerals (MULTIVITAMIN ADULT PO) Take 1 tablet by mouth daily.   Yes [provider]  naproxen (NAPROSYN) 500 MG tablet Take 1 tablet (500 mg total) by mouth 2 (two) times daily as needed for headache. 12/29/17  Yes Wieters, Hallie C, PA-C  Psyllium (FIBER) 0.52 g CAPS Take 1 capsule by mouth daily.   Yes [provider]  rizatriptan (MAXALT-MLT) 10 MG disintegrating tablet Take 1 tablet (10 mg total) by mouth as needed for migraine. May repeat in 2 hours if needed.  Max 30 mg/24 h 09/17/17  Yes Dorena Dew, FNP  sitaGLIPtin (JANUVIA) 100 MG tablet Take 1 tablet (100 mg total) by mouth daily. 12/04/17  Yes Lanae Boast, FNP  EASY COMFORT PEN NEEDLES 32G X 4 MM MISC use as directed test 2 times daily 05/14/17   [provider]  ibuprofen (ADVIL,MOTRIN) 600 MG tablet Take 1 tablet (600 mg total) by mouth every 6 (six) hours as needed. 03/15/18   Charlesetta Shanks, MD  Insulin Pen Needle (PEN NEEDLES) 32G X 5 MM MISC 100 each by Does not apply route 2 (two) times daily. 05/14/17   Dorena Dew, FNP  ondansetron (ZOFRAN ODT) 4 MG disintegrating tablet Take 1 tablet (4 mg total) by mouth every 4 (four) hours as needed for nausea or vomiting. 03/15/18   Charlesetta Shanks, MD    Family History Family History  Problem Relation Age of Onset  . Diabetes Father   . Hyperlipidemia Father   . Hypertension Father   . Heart disease Father   . Kidney disease Father        Developed after 2nd CABG  . Diabetes Paternal Grandfather   . Heart disease  Paternal Grandfather   . Hyperlipidemia Paternal Grandfather   . Hypertension Paternal Grandfather   . Kidney disease Paternal Grandfather     Social History Social History   Tobacco Use  . Smoking status: Never Smoker  . Smokeless tobacco: Never Used  Substance Use Topics  . Alcohol use: No  . Drug use: No     Allergies   Corn-containing products; Honey; Mango flavor; and Victoza [liraglutide]  Review of Systems Review of Systems 10 Systems reviewed and are negative for acute change except as noted in the HPI.   Physical Exam Updated Vital Signs BP 124/72 (BP Location: Left Arm)   Pulse (!) 102   Temp 99.2 F (37.3 C) (Oral)   Resp 18   LMP 03/07/2018   SpO2 98%   Physical Exam  Constitutional: She is oriented to person, place, and time.  Patient is alert and nontoxic.  She does appear uncomfortable.  Mental status clear.  No respiratory distress.  HENT:  Head: Normocephalic and atraumatic.  Mouth/Throat: Oropharynx is clear and moist.  Eyes: Conjunctivae and EOM are normal.  Cardiovascular: Normal rate, regular rhythm, normal heart sounds and intact distal pulses.  Pulmonary/Chest: Effort normal and breath sounds normal.  Abdominal: Soft. She exhibits no distension. There is no tenderness. There is no guarding.  Musculoskeletal: Normal range of motion. She exhibits no edema or tenderness.  Neurological: She is alert and oriented to person, place, and time. No cranial nerve deficit. She exhibits normal muscle tone. Coordination normal.  Skin: Skin is warm and dry.  Psychiatric: She has a normal mood and affect.     ED Treatments / Results  Labs (all labs ordered are listed, but only abnormal results are displayed) Labs Reviewed  COMPREHENSIVE METABOLIC PANEL - Abnormal; Notable for the following components:      Result Value   Glucose, Bld 222 (*)    Calcium 8.6 (*)    ALT 63 (*)    All other components within normal limits  URINALYSIS, ROUTINE W  REFLEX MICROSCOPIC - Abnormal; Notable for the following components:   APPearance HAZY (*)    Glucose, UA 50 (*)    Ketones, ur 5 (*)    All other components within normal limits  LIPASE, BLOOD  CBC WITH DIFFERENTIAL/PLATELET  I-STAT BETA HCG BLOOD, ED ([facility], [facility], [facility] ONLY)  I-STAT CG4 LACTIC ACID, ED  I-STAT CG4 LACTIC ACID, ED    EKG None  Radiology No results found.  Procedures Procedures (including critical care time)  Medications Ordered in ED Medications  sodium chloride 0.9 % bolus 1,000 mL (has no administration in time range)    Followed by  sodium chloride 0.9 % bolus 1,000 mL (has no administration in time range)    Followed by  0.9 %  sodium chloride infusion (1,000 mLs Intravenous New Bag/Given 03/15/18 1439)  ketorolac (TORADOL) 30 MG/ML injection 30 mg (30 mg Intravenous Given 03/15/18 1501)  metoCLOPramide (REGLAN) injection 10 mg (10 mg Intravenous Given 03/15/18 1501)  diphenhydrAMINE (BENADRYL) injection 25 mg (25 mg Intravenous Given 03/15/18 1501)  famotidine (PEPCID) IVPB 20 mg premix (20 mg Intravenous New Bag/Given 03/15/18 1502)     Initial Impression / Assessment and Plan / ED Course  I have reviewed the triage vital signs and the nursing notes.  Pertinent labs & imaging results that were available during my care of the patient were reviewed by me and considered in my medical decision making (see chart for details).    Patient is up and ambulatory after hydration and symptoms control.  She reports feeling much improved.  At this time, I suspect viral illness.  Patient felt well until yesterday evening and then had generalized myalgias and awoke with vomiting.  Her mental status is clear.  No meningismus.  No neurologic dysfunction.  Patient stable for discharge with return precautions reviewed.  Final Clinical Impressions(s) / ED Diagnoses   Final diagnoses:  Flu-like symptoms  Non-intractable vomiting with nausea, unspecified vomiting type     ED Discharge Orders         Ordered    ondansetron (ZOFRAN ODT) 4 MG disintegrating tablet  Every 4 hours PRN     03/15/18 1603    ibuprofen (ADVIL,MOTRIN) 600 MG tablet  Every 6 hours PRN     03/15/18 1603           Charlesetta Shanks, MD 03/15/18 1605

## 2018-03-15 NOTE — ED Notes (Signed)
Pt states she still does not have to urinate

## 2018-03-15 NOTE — ED Triage Notes (Signed)
Pt woke up at 230AM today with nausea/vomiting. Pt tried drinking soda, then went to bed again. Pt then woke up with vomiting again and aching all over.

## 2018-03-15 NOTE — Telephone Encounter (Signed)
Received call via 'on-call' line to contact patient. She is a patient of Lanae Boast, NP at our office. Contacted patient to assess for distress. Spoke to daughter. She reports that patient has been having nausea, vomiting, migraines, and increased abdominal pain this morning and all night the previous night. He symptom are continuous and have worsens, as reported by daughter. She is not taking any medication to aide in relieving her symptoms. She denies blood in her stools or urine, diarrhea, and constipation. Denies chest pain, cough, and heart palpitations. She is advised to report to ED if she develops worsening symptoms. Patient and daughter verbalized understanding.   Kathe Becton,  MSN, FNP-C Patient Lori Shea [address] [place], [place] [address] [phone]

## 2018-03-28 ENCOUNTER — Ambulatory Visit (INDEPENDENT_AMBULATORY_CARE_PROVIDER_SITE_OTHER): Payer: Self-pay | Admitting: Family Medicine

## 2018-03-28 VITALS — BP 133/82 | HR 82 | Temp 98.1°F | Resp 16 | Ht 65.0 in | Wt 269.0 lb

## 2018-03-28 DIAGNOSIS — A084 Viral intestinal infection, unspecified: Secondary | ICD-10-CM

## 2018-03-28 DIAGNOSIS — R059 Cough, unspecified: Secondary | ICD-10-CM

## 2018-03-28 DIAGNOSIS — K219 Gastro-esophageal reflux disease without esophagitis: Secondary | ICD-10-CM

## 2018-03-28 DIAGNOSIS — R05 Cough: Secondary | ICD-10-CM

## 2018-03-28 MED ORDER — BENZONATATE 100 MG PO CAPS: 100.0000 mg | ORAL_CAPSULE | Freq: Two times a day (BID) | ORAL | 0 refills | Status: DC | PRN

## 2018-03-28 MED ORDER — FAMOTIDINE 40 MG PO TABS: 40.0000 mg | ORAL_TABLET | Freq: Two times a day (BID) | ORAL | 0 refills | Status: DC

## 2018-03-28 MED ORDER — DEXLANSOPRAZOLE 30 MG PO CPDR: 60.0000 mg | DELAYED_RELEASE_CAPSULE | Freq: Every day | ORAL | 0 refills | Status: DC

## 2018-03-28 MED FILL — DEXILANT DR 30 MG CAPSULE: 30 | 15 days supply | Qty: 30 | Fill #0

## 2018-03-28 MED FILL — FAMOTIDINE 40 MG TABS: 40 | 15 days supply | Qty: 30 | Fill #0

## 2018-03-28 MED FILL — BENZONATATE 100 MG CAP: 100 | 10 days supply | Qty: 20 | Fill #0

## 2018-03-28 NOTE — Patient Instructions (Signed)
Diabetes Mellitus and Sick Day Management Blood sugar (glucose) can be difficult to control when you are sick. Common illnesses that can cause problems for people with diabetes (diabetes mellitus) include colds, fever, flu (influenza), nausea, vomiting, and diarrhea. These illnesses can cause stress and loss of body fluids (dehydration), and those issues can cause blood glucose levels to increase. Because of this, it is very important to take your insulin and diabetes medicines and eat some form of carbohydrate when you are sick. You should make a plan for days when you are sick (sick day plan) as part of your diabetes management plan. You and your health care provider should make this plan in advance. The following guidelines are intended to help you manage an illness that lasts for about 24 hours or less. Your health care provider may also give you more specific instructions. What do I need to do to manage my blood glucose?  Check your blood glucose every 2-4 hours, or as often as told by your health care provider.  Know your sick day treatment goals. Your target blood glucose levels may be different when you are sick.  If you use insulin, take your usual dose. ? If your blood glucose continues to be too high, you may need to take an additional insulin dose as told by your health care provider.  If you use oral diabetes medicine, you may need to stop taking it if you are not able to eat or drink normally. Ask your health care provider about whether you need to stop taking these medicines while you are sick.  If you use injectable hormone medicines other than insulin to control your diabetes, ask your health care provider about whether you need to stop taking these medicines while you are sick. What else can I do to manage my diabetes when I am sick? Check your ketones  If you have type 1 diabetes, check your urine ketones every 4 hours.  If you have type 2 diabetes, check your urine ketones as  often as told by your health care provider. Drink fluids  Drink enough fluid to keep your urine clear or pale yellow. This is especially important if you have a fever, vomiting, or diarrhea. Those symptoms can lead to dehydration.  Follow any instructions from your health care provider about beverages to avoid. ? Do not drink alcohol, caffeine, or drinks that contain a lot of sugar. Take medicines as directed  Take-over-the-counter and prescription medicines only as told by your health care provider.  Check medicine labels for added sugars. Some medicines may contain sugar or types of sugars that can raise your blood glucose level. What foods can I eat when I am sick? You need to eat some form of carbohydrates when you are sick. You should eat 45-50 grams (45-50 g) of carbohydrates every 3-4 hours until you feel better. All of the food choices below contain about 15 g of carbohydrates. Plan ahead and keep some of these foods around so you have them if you get sick.  4-6 oz (120-177 mL) carbonated beverage that contains sugar, such as regular (not diet) soda. You may be able to drink carbonated beverages more easily if you open the beverage and let it sit at room temperature for a few minutes before drinking.   of a twin frozen ice pop.  4 oz (120 g) regular gelatin.  4 oz (120 mL) fruit juice.  4 oz (120 g) ice cream or frozen yogurt.  2 oz (60  g) sherbet.  8 oz (240 mL) clear broth or soup.  4 oz (120 g) regular custard.  4 oz (120 g) regular pudding.  8 oz (240 g) plain yogurt.  1 slice bread or toast.  6 saltine crackers.  5 vanilla wafers.  Questions to ask your health care provider Consider asking the following questions so you know what to do on days when you are sick:  Should I adjust my diabetes medicines?  How often do I need to check my blood glucose?  What supplies do I need to manage my diabetes at home when I am sick?  What number can I call if I have  questions?  What foods and drinks should I avoid?  Contact a health care provider if:  You develop symptoms of diabetic ketoacidosis, such as: ? Fatigue. ? Weight loss. ? Excessive thirst. ? Light-headedness. ? Fruity or sweet-smelling breath. ? Excessive urination. ? Vision changes. ? Confusion or irritability. ? Nausea. ? Vomiting. ? Rapid breathing. ? Pain in the abdomen. ? Feeling flushed.  You are unable to drink fluids without vomiting.  You have any of the following for more than 6 hours: ? Nausea. ? Vomiting. ? Diarrhea.  Your blood glucose is at or above 240 mg/dL (13.3 mmol/L), even after you take an additional insulin dose.  You have a change in how you think, feel, or act (mental status).  You develop another serious illness.  You have been sick or have had a fever for 2 days or longer and you are not getting better. Get help right away if:  Your blood glucose is lower than 54 mg/dL (3.0 mmol/L).  You have difficulty breathing.  You have moderate or high ketone levels in your urine.  You used emergency glucagon to treat low blood glucose. Summary  Blood sugar (glucose) can be difficult to control when you are sick. Common illnesses that can cause problems for people with diabetes (diabetes mellitus) include colds, fever, flu (influenza), nausea, vomiting, and diarrhea.  Illnesses can cause stress and loss of body fluids (dehydration), and those issues can cause blood glucose levels to increase.  Make a plan for days when you are sick (sick day plan) as part of your diabetes management plan. You and your health care provider should make this plan in advance.  It is very important to take your insulin and diabetes medicines and to eat some form of carbohydrate when you are sick.  Contact your health care provider if have problems managing your blood glucose levels when you are sick, or if you have been sick or had a fever for 2 days or longer and are  not getting better. This information is not intended to replace advice given to you by your health care provider. Make sure you discuss any questions you have with your health care provider. Document Released: 04/12/2003 Document Revised: 01/06/2016 Document Reviewed: 01/06/2016 Elsevier Interactive Patient Education  2018 [employer]. Viral Gastroenteritis, Adult Viral gastroenteritis is also known as the stomach flu. This condition is caused by certain germs (viruses). These germs can be passed from person to person very easily (are very contagious). This condition can cause sudden watery poop (diarrhea), fever, and throwing up (vomiting). Having watery poop and throwing up can make you feel weak and cause you to get dehydrated. Dehydration can make you tired and thirsty, make you have a dry mouth, and make it so you pee (urinate) less often. Older adults and people with other diseases or  a weak defense system (immune system) are at higher risk for dehydration. It is important to replace the fluids that you lose from having watery poop and throwing up. Follow these instructions at home: Follow instructions from your doctor about how to care for yourself at home. Eating and drinking  Follow these instructions as told by your doctor:  Take an oral rehydration solution (ORS). This is a drink that is sold at pharmacies and stores.  Drink clear fluids in small amounts as you are able, such as: ? Water. ? Ice chips. ? Diluted fruit juice. ? Low-calorie sports drinks.  Eat bland, easy-to-digest foods in small amounts as you are able, such as: ? Bananas. ? Applesauce. ? Rice. ? Low-fat (lean) meats. ? Toast. ? Crackers.  Avoid fluids that have a lot of sugar or caffeine in them.  Avoid alcohol.  Avoid spicy or fatty foods.  General instructions  Drink enough fluid to keep your pee (urine) clear or pale yellow.  Wash your hands often. If you cannot use soap and water, use hand  sanitizer.  Make sure that all people in your home wash their hands well and often.  Rest at home while you get better.  Take over-the-counter and prescription medicines only as told by your doctor.  Watch your condition for any changes.  Take a warm bath to help with any burning or pain from having watery poop.  Keep all follow-up visits as told by your doctor. This is important. Contact a doctor if:  You cannot keep fluids down.  Your symptoms get worse.  You have new symptoms.  You feel light-headed or dizzy.  You have muscle cramps. Get help right away if:  You have chest pain.  You feel very weak or you pass out (faint).  You see blood in your throw-up.  Your throw-up looks like coffee grounds.  You have bloody or black poop (stools) or poop that look like tar.  You have a very bad headache, a stiff neck, or both.  You have a rash.  You have very bad pain, cramping, or bloating in your belly (abdomen).  You have trouble breathing.  You are breathing very quickly.  Your heart is beating very quickly.  Your skin feels cold and clammy.  You feel confused.  You have pain when you pee.  You have signs of dehydration, such as: ? Dark pee, hardly any pee, or no pee. ? Cracked lips. ? Dry mouth. ? Sunken eyes. ? Sleepiness. ? Weakness. This information is not intended to replace advice given to you by your health care provider. Make sure you discuss any questions you have with your health care provider. Document Released: 09/26/2007 Document Revised: 10/28/2015 Document Reviewed: 12/14/2014 Elsevier Interactive Patient Education  2017 [employer].

## 2018-03-28 NOTE — Progress Notes (Signed)
Acute Office Visit  Subjective:    Patient ID: Lori Shea, female    DOB: 1975/06/17, 42 y.o.   MRN: [id number]  Chief Complaint  Patient presents with  . Follow-up    er follow up   . Nausea  . Abdominal Cramping  . Cough  . Nasal Congestion    HPI Patient is in today for ED follow-up.  Patient seen in the emergency department on 03/15/2018 with complaints of nausea vomiting abdominal cramping and flulike symptoms.  Patient states that she had had the symptoms for about a week prior to going to the emergency department.  Patient states symptoms started after ingesting boneless wings from [employer].  Patient states that the diarrhea and vomiting have now ceased, but she continues to have abdominal pain cramping and gas especially after eating.  Patient takes Dexilant 30 mg daily.  She denies heartburn at the present time, but does endorse coughing. Patient states that her sugars have been running higher during her illness.  She has an appointment with endocrinology on April 28, 2018.  Patient reports that her fasting blood sugars have been in the 200s.  Past Medical History:  Diagnosis Date  . Allergy    Foods:  mango, honey, corn.  Allergic to "everything tested"  15 years ago in [place]  . Asthma age 41 yo   Triggers:  cold weather, exercise, allergens, anxiety, URIs  . Cholelithiasis age 23 yo   Asymptomatic  . Diabetes mellitus 2012   Was diagnosed 2 years earlier with PCOS  . Elevated liver enzymes 09/13/2016  . Hypertension 2015  . Kidney stones   . Migraines 2009  . Morbid obesity ([place])   . PCOS (polycystic ovarian syndrome) 2010    Past Surgical History:  Procedure Laterality Date  . COLPOSCOPY N/A     Family History  Problem Relation Age of Onset  . Diabetes Father   . Hyperlipidemia Father   . Hypertension Father   . Heart disease Father   . Kidney disease Father        Developed after 2nd CABG  . Diabetes Paternal Grandfather   . Heart  disease Paternal Grandfather   . Hyperlipidemia Paternal Grandfather   . Hypertension Paternal Grandfather   . Kidney disease Paternal Grandfather     Social History   Socioeconomic History  . Marital status: Divorced    Spouse name: Not on file  . Number of children: 1  . Years of education: some comm. college  . Highest education level: Not on file  Occupational History  . Occupation: CNA    Comment: Veterinary surgeon  . Financial resource strain: Not on file  . Food insecurity:    Worry: Not on file    Inability: Not on file  . Transportation needs:    Medical: Not on file    Non-medical: Not on file  Tobacco Use  . Smoking status: Never Smoker  . Smokeless tobacco: Never Used  Substance and Sexual Activity  . Alcohol use: No  . Drug use: No  . Sexual activity: Yes    Birth control/protection: None    Comment: Sprintec  Lifestyle  . Physical activity:    Days per week: Not on file    Minutes per session: Not on file  . Stress: Not on file  Relationships  . Social connections:    Talks on phone: Not on file    Gets together: Not on file  Attends religious service: Not on file    Active member of club or organization: Not on file    Attends meetings of clubs or organizations: Not on file    Relationship status: Not on file  . Intimate partner violence:    Fear of current or ex partner: Not on file    Emotionally abused: Not on file    Physically abused: Not on file    Forced sexual activity: Not on file  Other Topics Concern  . Not on file  Social History Narrative   Originally from [place], [place] to [place] in 1988.   In [place] in [place] at home with daughter on [place].    Outpatient Medications Prior to Visit  Medication Sig Dispense Refill  . albuterol (PROVENTIL HFA;VENTOLIN HFA) 108 (90 Base) MCG/ACT inhaler Inhale 2 puffs into the lungs every 4 (four) hours as needed for wheezing or shortness  of breath. 2 each 2  . albuterol (PROVENTIL) (2.5 MG/3ML) 0.083% nebulizer solution Take 3 mLs (2.5 mg total) by nebulization every 6 (six) hours as needed for wheezing or shortness of breath. 75 mL 0  . amitriptyline (ELAVIL) 25 MG tablet Take 1 tablet (25 mg total) by mouth at bedtime. 30 tablet 5  . Azilsartan-Chlorthalidone (EDARBYCLOR) 40-25 MG TABS Take 1 tablet by mouth daily. 90 tablet 1  . Black Elderberry,Berry-Flower, 575 MG CAPS Take 1 capsule by mouth daily.    . cetirizine (ZYRTEC) 10 MG tablet Take 1 tablet (10 mg total) by mouth daily. 30 tablet 11  . Cinnamon 500 MG capsule Take 500 mg by mouth daily.    Marland Kitchen EASY COMFORT PEN NEEDLES 32G X 4 MM MISC use as directed test 2 times daily  3  . Fluticasone-Salmeterol (ADVAIR DISKUS) 100-50 MCG/DOSE AEPB Inhale 1 puff into the lungs every 12 (twelve) hours. 60 each 2  . ibuprofen (ADVIL,MOTRIN) 600 MG tablet Take 1 tablet (600 mg total) by mouth every 6 (six) hours as needed. 30 tablet 0  . Insulin Glargine (LANTUS SOLOSTAR) 100 UNIT/ML Solostar Pen Inject 40 Units into the skin daily. (Patient taking differently: Inject 40 Units into the skin 2 (two) times daily. ) 5 pen 11  . Insulin Pen Needle (PEN NEEDLES) 32G X 5 MM MISC 100 each by Does not apply route 2 (two) times daily. 100 each 3  . Magnesium Citrate 100 MG TABS Take 100 mg by mouth every evening.    . meloxicam (MOBIC) 15 MG tablet Take 15 mg by mouth daily.    . metFORMIN (GLUCOPHAGE) 500 MG tablet Take 1 tablet (500 mg total) by mouth 2 (two) times daily with a meal. 60 tablet 5  . montelukast (SINGULAIR) 10 MG tablet Take 1 tablet (10 mg total) by mouth daily. 30 tablet 5  . Multiple Vitamins-Minerals (MULTIVITAMIN ADULT PO) Take 1 tablet by mouth daily.    . naproxen (NAPROSYN) 500 MG tablet Take 1 tablet (500 mg total) by mouth 2 (two) times daily as needed for headache. 30 tablet 0  . promethazine (PHENERGAN) 12.5 MG tablet Take 12.5 mg by mouth every 6 (six) hours as  needed for nausea or vomiting.    . Psyllium (FIBER) 0.52 g CAPS Take 1 capsule by mouth daily.    . rizatriptan (MAXALT-MLT) 10 MG disintegrating tablet Take 1 tablet (10 mg total) by mouth as needed for migraine. May repeat in 2 hours if needed.  Max 30 mg/24 h 12 tablet  6  . sitaGLIPtin (JANUVIA) 100 MG tablet Take 1 tablet (100 mg total) by mouth daily. 30 tablet 5  . Dexlansoprazole (DEXILANT) 30 MG capsule Take 1 capsule (30 mg total) by mouth daily. Take on empty stomach 30 capsule 5  . ondansetron (ZOFRAN ODT) 4 MG disintegrating tablet Take 1 tablet (4 mg total) by mouth every 4 (four) hours as needed for nausea or vomiting. (Patient not taking: Reported on 03/28/2018) 20 tablet 0   No facility-administered medications prior to visit.     Allergies  Allergen Reactions  . Corn-Containing Products Hives    "really bad asthma attacks"  . Honey Shortness Of Breath  . 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 Victoza [Liraglutide] Nausea And Vomiting    Review of Systems  Constitutional: Negative for chills and fever.  Respiratory: Positive for cough.   Gastrointestinal: Positive for heartburn and nausea. Negative for blood in stool.  All other systems reviewed and are negative.      Objective:    Physical Exam  Constitutional: She is oriented to person, place, and time. She appears well-developed and well-nourished. No distress.  HENT:  Head: Normocephalic and atraumatic.  Eyes: Pupils are equal, round, and reactive to light. Conjunctivae and EOM are normal.  Neck: Normal range of motion.  Cardiovascular: Normal rate, regular rhythm and normal heart sounds.  Pulmonary/Chest: Effort normal and breath sounds normal. No respiratory distress.  Abdominal: Soft. Bowel sounds are normal. She exhibits no distension. There is no tenderness.  Musculoskeletal: Normal range of motion.  Neurological: She is alert and oriented to person, place, and time.  Skin: Skin is warm and dry.   Psychiatric: She has a normal mood and affect. Her behavior is normal. Judgment and thought content normal.  Nursing note and vitals reviewed.   BP 133/82 (BP Location: Left Arm, Patient Position: Sitting, Cuff Size: Large)   Pulse 82   Temp 98.1 F (36.7 C) (Oral)   Resp 16   Ht 5\' 5"  (1.651 m)   Wt 269 lb (122 kg)   LMP 03/07/2018   SpO2 99%   BMI 44.76 kg/m  Wt Readings from Last 3 Encounters:  03/28/18 269 lb (122 kg)  03/14/18 270 lb (122.5 kg)  02/10/18 265 lb (120.2 kg)    Health Maintenance Due  Topic Date Due  . PNEUMOCOCCAL POLYSACCHARIDE VACCINE AGE 29-64 HIGH RISK  06/03/1976  . OPHTHALMOLOGY EXAM  06/03/1984  . HIV Screening  06/03/1989    There are no preventive care reminders to display for this patient.   Lab Results  Component Value Date   TSH 5.090 (H) 02/10/2018   Lab Results  Component Value Date   WBC 8.7 03/15/2018   HGB 13.4 03/15/2018   HCT 41.4 03/15/2018   MCV 89.0 03/15/2018   PLT 251 03/15/2018   Lab Results  Component Value Date   NA 138 03/15/2018   K 3.6 03/15/2018   CO2 25 03/15/2018   GLUCOSE 222 (H) 03/15/2018   BUN 14 03/15/2018   CREATININE 0.69 03/15/2018   BILITOT 0.8 03/15/2018   ALKPHOS 81 03/15/2018   AST 32 03/15/2018   ALT 63 (H) 03/15/2018   PROT 6.9 03/15/2018   ALBUMIN 3.6 03/15/2018   CALCIUM 8.6 (L) 03/15/2018   ANIONGAP 9 03/15/2018   Lab Results  Component Value Date   CHOL 180 07/19/2016   Lab Results  Component Value Date   HDL 60 07/19/2016   Lab  Results  Component Value Date   LDLCALC 98 07/19/2016   Lab Results  Component Value Date   TRIG 109 07/19/2016   Lab Results  Component Value Date   CHOLHDL 3.6 Ratio 02/09/2009   Lab Results  Component Value Date   HGBA1C 11.0 (A) 03/14/2018       Assessment & Plan:   Problem List Items Addressed This Visit    None    Visit Diagnoses    Gastroesophageal reflux disease without esophagitis    -  Primary   Relevant Medications    famotidine (PEPCID) 40 MG tablet   Dexlansoprazole (DEXILANT) 30 MG capsule   Cough       Relevant Medications   benzonatate (TESSALON) 100 MG capsule   Viral gastroenteritis           Meds ordered this encounter  Medications  . famotidine (PEPCID) 40 MG tablet    Sig: Take 1 tablet (40 mg total) by mouth 2 (two) times daily for 15 days.    Dispense:  30 tablet    Refill:  0  . Dexlansoprazole (DEXILANT) 30 MG capsule    Sig: Take 2 capsules (60 mg total) by mouth daily for 15 days. Take on empty stomach    Dispense:  30 capsule    Refill:  0  . benzonatate (TESSALON) 100 MG capsule    Sig: Take 1 capsule (100 mg total) by mouth 2 (two) times daily as needed for cough.    Dispense:  20 capsule    Refill:  0   Suspect nausea is from GERD.  Will increase Dexilant to 60 mg daily for the next 14 days.  Adding famotidine 40 mg twice a day for the next 14 days.  Patient advised to increase fluids during illness.  We will follow-up with endocrinology for diabetes management.  Lanae Boast, FNP

## 2018-04-08 ENCOUNTER — Other Ambulatory Visit: Payer: Self-pay | Admitting: Family Medicine

## 2018-04-08 DIAGNOSIS — J4530 Mild persistent asthma, uncomplicated: Secondary | ICD-10-CM

## 2018-04-09 MED FILL — MONTELUKAST SOD 10 MG TAB: 10 | 30 days supply | Qty: 30 | Fill #2

## 2018-04-09 MED FILL — AMITRIPTYLINE HCL 25 MG TAB: 25 | 30 days supply | Qty: 30 | Fill #4

## 2018-04-25 ENCOUNTER — Ambulatory Visit (INDEPENDENT_AMBULATORY_CARE_PROVIDER_SITE_OTHER): Payer: Medicaid Other | Admitting: Internal Medicine

## 2018-04-25 ENCOUNTER — Encounter: Payer: Self-pay | Admitting: Internal Medicine

## 2018-04-25 VITALS — BP 120/82 | HR 95 | Ht 65.0 in | Wt 265.0 lb

## 2018-04-25 DIAGNOSIS — E1165 Type 2 diabetes mellitus with hyperglycemia: Secondary | ICD-10-CM

## 2018-04-25 DIAGNOSIS — Z794 Long term (current) use of insulin: Secondary | ICD-10-CM

## 2018-04-25 MED ORDER — INSULIN ASPART 100 UNIT/ML FLEXPEN: 10.0000 [IU] | PEN_INJECTOR | Freq: Three times a day (TID) | SUBCUTANEOUS | 5 refills | Status: DC

## 2018-04-25 NOTE — Progress Notes (Signed)
Patient ID: Lori Shea, female   DOB: [birth year], 43 y.o.   MRN: [id number]  HPI: Lori Shea is a 43 y.o.-year-old female, referred by her PCP, Lanae Boast, FNP, for management of DM2, dx in 2012, insulin-dependent since 2016, uncontrolled, without long-term complications.  Reviewed latest HbA1c level: Lab Results  Component Value Date   HGBA1C 11.0 (A) 03/14/2018   HGBA1C 13.2 (A) 11/22/2017   HGBA1C 11.7 07/11/2017   HGBA1C 10.2 05/09/2017   HGBA1C 9.5 04/08/2017   HGBA1C 8.8 (H) 07/19/2016   HGBA1C 7.7 (H) 02/22/2011   Pt is on a regimen of: - Metformin 1000 mg at dinner - Januvia 100 mg before b'fast - Lantus 40 units 2x a day - Cinnamon 1000 mg 1x a day in am She tried Victoza >> AP, vomiting. She tried Invokana >> did very well on it but stopped 2/2 negative publicity as rec'd but her Dr at that time. She tried farxiga >> yeast inf.  Pt checks her sugars 0-1x a day and they are: - am: 120, 150s, but during the Holidays: 250-287 - 2h after b'fast: n/c - before lunch: n/c - 2h after lunch: n/c - before dinner: n/c - 2h after dinner: n/c - bedtime: n/c - nighttime: n/c Lowest sugar was 120; she has hypoglycemia awareness at 70.  Highest sugar was 448.  Glucometer: ReliOn Prime  Pt's meals are: - Breakfast: oatmeal, protein shake - Lunch: varies - Dinner: meat, veggies - Snacks: 2: am and pm  - no CKD, last BUN/creatinine:  Lab Results  Component Value Date   BUN 14 03/15/2018   BUN 11 03/14/2018   CREATININE 0.69 03/15/2018   CREATININE 0.66 03/14/2018  On Azilsartan.  - No HL;  last set of lipids: Lab Results  Component Value Date   CHOL 180 07/19/2016   HDL 60 07/19/2016   LDLCALC 98 07/19/2016   TRIG 109 07/19/2016   CHOLHDL 3.6 Ratio 02/09/2009  Not on a statin.  - last eye exam was in 2017. No DR.   - no numbness and tingling in her feet. Shooting Pain in feet. Restless legs.  Pt has FH of DM in father - massive AMI at 83 >>  passed away.  Patient also has a history of HTN and asthma.  ROS: Constitutional: + weight gain, no weight loss, no fatigue, + subjective hyperthermia, no subjective hypothermia, no nocturia Eyes: + Blurry vision, no xerophthalmia ENT: + Sore throat, no nodules palpated in neck, no dysphagia, no odynophagia, + hoarseness, no tinnitus, no hypoacusis Cardiovascular: no CP, no SOB, no palpitations, + leg swelling Respiratory: + All: Cough, shortness of breath, wheezing Gastrointestinal: + All: Nausea, vomiting, diarrhea + acid reflux Musculoskeletal: + Muscle, + joint aches Skin: no rash, +hair loss, + itching Neurological: no tremors, no numbness or tingling/no dizziness/no HAs Psychiatric: no depression, no anxiety  Past Medical History:  Diagnosis Date  . Allergy    Foods:  mango, honey, corn.  Allergic to "everything tested"  15 years ago in [place]  . Asthma age 43 yo   Triggers:  cold weather, exercise, allergens, anxiety, URIs  . Cholelithiasis age 43 yo   Asymptomatic  . Diabetes mellitus 2012   Was diagnosed 2 years earlier with PCOS  . Elevated liver enzymes 09/13/2016  . Hypertension 2015  . Kidney stones   . Migraines 2009  . Morbid obesity ([place])   . PCOS (polycystic ovarian syndrome) 2010   Past Surgical History:  Procedure Laterality Date  .  COLPOSCOPY N/A    Social History   Socioeconomic History  . Marital status: Divorced    Spouse name: Not on file  . Number of children: 1  . Years of education: some comm. college  . Highest education level: Not on file  Occupational History  . Occupation: CNA    Comment: Veterinary surgeon  . Financial resource strain: Not on file  . Food insecurity:    Worry: Not on file    Inability: Not on file  . Transportation needs:    Medical: Not on file    Non-medical: Not on file  Tobacco Use  . Smoking status: Never Smoker  . Smokeless tobacco: Never Used  Substance and Sexual Activity  . Alcohol  use: No  . Drug use: No  . Sexual activity: Yes    Birth control/protection: None    Comment: Sprintec  Lifestyle  . Physical activity:    Days per week: Not on file    Minutes per session: Not on file  . Stress: Not on file  Relationships  . Social connections:    Talks on phone: Not on file    Gets together: Not on file    Attends religious service: Not on file    Active member of club or organization: Not on file    Attends meetings of clubs or organizations: Not on file    Relationship status: Not on file  . Intimate partner violence:    Fear of current or ex partner: Not on file    Emotionally abused: Not on file    Physically abused: Not on file    Forced sexual activity: Not on file  Other Topics Concern  . Not on file  Social History Narrative   Originally from [place], [place] to [place] in 1988.   In [place] in [place] at home with daughter on [address].   Current Outpatient Medications on File Prior to Visit  Medication Sig Dispense Refill  . ADVAIR DISKUS 100-50 MCG/DOSE AEPB INHALE 1 PUFF INTO THE LUNGS EVERY 12 HOURS. 3 each 0  . albuterol (PROVENTIL HFA;VENTOLIN HFA) 108 (90 Base) MCG/ACT inhaler Inhale 2 puffs into the lungs every 4 (four) hours as needed for wheezing or shortness of breath. 2 each 2  . albuterol (PROVENTIL) (2.5 MG/3ML) 0.083% nebulizer solution Take 3 mLs (2.5 mg total) by nebulization every 6 (six) hours as needed for wheezing or shortness of breath. 75 mL 0  . amitriptyline (ELAVIL) 25 MG tablet Take 1 tablet (25 mg total) by mouth at bedtime. 30 tablet 5  . Azilsartan-Chlorthalidone (EDARBYCLOR) 40-25 MG TABS Take 1 tablet by mouth daily. 90 tablet 1  . Black Elderberry,Berry-Flower, 575 MG CAPS Take 1 capsule by mouth daily.    . cetirizine (ZYRTEC) 10 MG tablet Take 1 tablet (10 mg total) by mouth daily. 30 tablet 11  . Cinnamon 500 MG capsule Take 500 mg by mouth daily.    Marland Kitchen EASY COMFORT PEN NEEDLES 32G X 4  MM MISC use as directed test 2 times daily  3  . ibuprofen (ADVIL,MOTRIN) 600 MG tablet Take 1 tablet (600 mg total) by mouth every 6 (six) hours as needed. 30 tablet 0  . Insulin Glargine (LANTUS SOLOSTAR) 100 UNIT/ML Solostar Pen Inject 40 Units into the skin daily. (Patient taking differently: Inject 40 Units into the skin 2 (two) times daily. ) 5 pen 11  . Insulin Pen Needle (PEN  NEEDLES) 32G X 5 MM MISC 100 each by Does not apply route 2 (two) times daily. 100 each 3  . Magnesium Citrate 100 MG TABS Take 100 mg by mouth every evening.    . meloxicam (MOBIC) 15 MG tablet Take 15 mg by mouth daily.    . montelukast (SINGULAIR) 10 MG tablet Take 1 tablet (10 mg total) by mouth daily. 30 tablet 5  . Multiple Vitamins-Minerals (MULTIVITAMIN ADULT PO) Take 1 tablet by mouth daily.    . naproxen (NAPROSYN) 500 MG tablet Take 1 tablet (500 mg total) by mouth 2 (two) times daily as needed for headache. 30 tablet 0  . promethazine (PHENERGAN) 12.5 MG tablet Take 12.5 mg by mouth every 6 (six) hours as needed for nausea or vomiting.    . Psyllium (FIBER) 0.52 g CAPS Take 1 capsule by mouth daily.    . rizatriptan (MAXALT-MLT) 10 MG disintegrating tablet Take 1 tablet (10 mg total) by mouth as needed for migraine. May repeat in 2 hours if needed.  Max 30 mg/24 h 12 tablet 6  . sitaGLIPtin (JANUVIA) 100 MG tablet Take 1 tablet (100 mg total) by mouth daily. 30 tablet 5  . benzonatate (TESSALON) 100 MG capsule Take 1 capsule (100 mg total) by mouth 2 (two) times daily as needed for cough. (Patient not taking: Reported on 04/25/2018) 20 capsule 0  . Dexlansoprazole (DEXILANT) 30 MG capsule Take 2 capsules (60 mg total) by mouth daily for 15 days. Take on empty stomach 30 capsule 0  . famotidine (PEPCID) 40 MG tablet Take 1 tablet (40 mg total) by mouth 2 (two) times daily for 15 days. 30 tablet 0  . metFORMIN (GLUCOPHAGE) 500 MG tablet Take 1 tablet (500 mg total) by mouth 2 (two) times daily with a meal. 60  tablet 5  . ondansetron (ZOFRAN ODT) 4 MG disintegrating tablet Take 1 tablet (4 mg total) by mouth every 4 (four) hours as needed for nausea or vomiting. (Patient not taking: Reported on 03/28/2018) 20 tablet 0   No current facility-administered medications on file prior to visit.    Allergies  Allergen Reactions  . Corn-Containing Products Hives    "really bad asthma attacks"  . Honey Shortness Of Breath  . 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 Victoza [Liraglutide] Nausea And Vomiting   Family History  Problem Relation Age of Onset  . Diabetes Father   . Hyperlipidemia Father   . Hypertension Father   . Heart disease Father   . Kidney disease Father        Developed after 2nd CABG  . Diabetes Paternal Grandfather   . Heart disease Paternal Grandfather   . Hyperlipidemia Paternal Grandfather   . Hypertension Paternal Grandfather   . Kidney disease Paternal Grandfather      PE: BP 120/82   Pulse 95   Ht 5\' 5"  (1.651 m) Comment: measured  Wt 265 lb (120.2 kg)   LMP 04/06/2018   SpO2 97%   BMI 44.10 kg/m  Wt Readings from Last 3 Encounters:  04/25/18 265 lb (120.2 kg)  03/28/18 269 lb (122 kg)  03/14/18 270 lb (122.5 kg)   Constitutional: overweight, in NAD Eyes: PERRLA, EOMI, no exophthalmos ENT: moist mucous membranes, no thyromegaly, no cervical lymphadenopathy Cardiovascular: Tachycardia, RR, No MRG Respiratory: CTA B Gastrointestinal: abdomen soft, NT, ND, BS+ Musculoskeletal: no deformities, strength intact in all 4 Skin: moist, warm, no rashes Neurological: no tremor with outstretched  hands, DTR normal in all 4  ASSESSMENT: 1. DM2, insulin-dependent, uncontrolled, without long-term complications, but with hyperglycemia  PLAN:  1. Patient with long-standing, uncontrolled diabetes, on oral antidiabetic regimen + a high dose of basal insulin, which became insufficient. Latest HbA1c was very high, 11%. -She is not checking sugars consistently, and  only checking in the morning.  They are much higher now during the holidays.  We discussed that she is now in glucotoxic and it would be difficult to control her sugars with anything other than insulin.  However, a large dose of basal insulin is not covering her meals, so we decided to switch to a basal-bolus insulin regimen.  We will add rapid acting insulin before meals and in the meantime decrease the dose of her Lantus.  I suggested to take it only at bedtime to hopefully improve her a.m. sugars more. -For now, I advised her to continue Januvia until she runs out, but this is simply not a powerful enough medicine to control her blood sugars for now.  She has been on the GLP-1 receptor agonist in the past, Victoza, and she did not tolerate this well.  I would probably suggest to give a weekly GLP-1 receptor agonist to try in the future to see if maybe she can come off mealtime insulin, but not for now.  Also, in the future, we may reintroduce an SGLT 2 inhibitor.  She did well on Invokana in the past but was taken off due to fear for amputations.  We discussed that the category of patients in the counter started that developed this complication were man, with elevated HbA1c and peripheral vascular disease or peripheral neuropathy.  I think it is worth trying her on Invokana again in the future, but for now, we will need to reduce her very high glucose levels. -We also discussed about improving diet.  I made some suggestions about breakfast and also given written instructions. - I suggested to:  Patient Instructions  Please continue: - Metformin 1000 mg but move it with dinner. - Januvia 100 mg before b'fast until you run out  Decrease: - Lantus to 60 units at bedtime  Start: - NovoLog 10-14 units before meals  Please return in 1.5 months with your sugar log.   - Strongly advised her to start checking sugars at different times of the day - check 2-3x a day, rotating checks - discussed about CBG  targets for treatment: 80-130 mg/dL before meals and <180 mg/dL after meals; target HbA1c <7%. - given sugar log and advised how to fill it and to bring it at next appt  - given foot care handout and explained the principles  - given instructions for hypoglycemia management "15-15 rule"  - advised for yearly eye exams  - Return to clinic in 1.5 mo with sugar log   Philemon Kingdom, MD PhD [facility] Endocrinology

## 2018-04-25 NOTE — Patient Instructions (Addendum)
Please continue: - Metformin 1000 mg but move it with dinner. - Januvia 100 mg before b'fast until you run out  Decrease: - Lantus to 60 units at bedtime  Start: - NovoLog 10-14 units before meals  Please return in 1.5 months with your sugar log.   PATIENT INSTRUCTIONS FOR TYPE 2 DIABETES:  **Please join MyChart!** - see attached instructions about how to join if you have not done so already.  DIET AND EXERCISE Diet and exercise is an important part of diabetic treatment.  We recommended aerobic exercise in the form of brisk walking (working between 40-60% of maximal aerobic capacity, similar to brisk walking) for 150 minutes per week (such as 30 minutes five days per week) along with 3 times per week performing 'resistance' training (using various gauge rubber tubes with handles) 5-10 exercises involving the major muscle groups (upper body, lower body and core) performing 10-15 repetitions (or near fatigue) each exercise. Start at half the above goal but build slowly to reach the above goals. If limited by weight, joint pain, or disability, we recommend daily walking in a swimming pool with water up to waist to reduce pressure from joints while allow for adequate exercise.    BLOOD GLUCOSES Monitoring your blood glucoses is important for continued management of your diabetes. Please check your blood glucoses 2-4 times a day: fasting, before meals and at bedtime (you can rotate these measurements - e.g. one day check before the 3 meals, the next day check before 2 of the meals and before bedtime, etc.).   HYPOGLYCEMIA (low blood sugar) Hypoglycemia is usually a reaction to not eating, exercising, or taking too much insulin/ other diabetes drugs.  Symptoms include tremors, sweating, hunger, confusion, headache, etc. Treat IMMEDIATELY with 15 grams of Carbs: . 4 glucose tablets .  cup regular juice/soda . 2 tablespoons raisins . 4 teaspoons sugar . 1 tablespoon honey Recheck blood  glucose in 15 mins and repeat above if still symptomatic/blood glucose <100.  RECOMMENDATIONS TO REDUCE YOUR RISK OF DIABETIC COMPLICATIONS: * Take your prescribed MEDICATION(S) * Follow a DIABETIC diet: Complex carbs, fiber rich foods, (monounsaturated and polyunsaturated) fats * AVOID saturated/trans fats, high fat foods, >2,300 mg salt per day. * EXERCISE at least 5 times a week for 30 minutes or preferably daily.  * DO NOT SMOKE OR DRINK more than 1 drink a day. * Check your FEET every day. Do not wear tightfitting shoes. Contact us if you develop an ulcer * See your EYE doctor once a year or more if needed * Get a FLU shot once a year * Get a PNEUMONIA vaccine once before and once after age 56 years  GOALS:  * Your Hemoglobin A1c of <7%  * fasting sugars need to be <130 * after meals sugars need to be <180 (2h after you start eating) * Your Systolic BP should be 009 or lower  * Your Diastolic BP should be 80 or lower  * Your HDL (Good Cholesterol) should be 40 or higher  * Your LDL (Bad Cholesterol) should be 100 or lower. * Your Triglycerides should be 150 or lower  * Your Urine microalbumin (kidney function) should be <30 * Your Body Mass Index should be 25 or lower    Please consider the following ways to cut down carbs and fat and increase fiber and micronutrients in your diet: - substitute whole grain for white bread or pasta - substitute brown rice for white rice - substitute 90-calorie flat bread  pieces for slices of bread when possible - substitute sweet potatoes or yams for white potatoes - substitute humus for margarine - substitute tofu for cheese when possible - substitute almond or rice milk for regular milk (would not drink soy milk daily due to concern for soy estrogen influence on breast cancer risk) - substitute dark chocolate for other sweets when possible - substitute water - can add lemon or orange slices for taste - for diet sodas (artificial sweeteners  will trick your body that you can eat sweets without getting calories and will lead you to overeating and weight gain in the long run) - do not skip breakfast or other meals (this will slow down the metabolism and will result in more weight gain over time)  - can try smoothies made from fruit and almond/rice milk in am instead of regular breakfast - can also try old-fashioned (not instant) oatmeal made with almond/rice milk in am - order the dressing on the side when eating salad at a restaurant (pour less than half of the dressing on the salad) - eat as little meat as possible - can try juicing, but should not forget that juicing will get rid of the fiber, so would alternate with eating raw veg./fruits or drinking smoothies - use as little oil as possible, even when using olive oil - can dress a salad with a mix of balsamic vinegar and lemon juice, for e.g. - use agave nectar, stevia sugar, or regular sugar rather than artificial sweateners - steam or broil/roast veggies  - snack on veggies/fruit/nuts (unsalted, preferably) when possible, rather than processed foods - reduce or eliminate aspartame in diet (it is in diet sodas, chewing gum, etc) Read the labels!  Try to read Dr. Janene Harvey book: "Program for Reversing Diabetes" for other ideas for healthy eating.

## 2018-05-02 ENCOUNTER — Ambulatory Visit: Payer: Medicaid Other | Admitting: Family Medicine

## 2018-05-06 ENCOUNTER — Other Ambulatory Visit: Payer: Self-pay

## 2018-05-06 ENCOUNTER — Telehealth: Payer: Self-pay | Admitting: Internal Medicine

## 2018-05-06 MED ORDER — INSULIN ASPART 100 UNIT/ML FLEXPEN: 10.0000 [IU] | PEN_INJECTOR | Freq: Three times a day (TID) | SUBCUTANEOUS | 5 refills | Status: DC

## 2018-05-06 NOTE — Telephone Encounter (Signed)
insulin aspart (NOVOLOG FLEXPEN) 100 UNIT/ML FlexPen   Patient stated this medication was sent to the incorrect pharmacy and needs to resent to the pharmacy below.      [place], [place] [facility] [place]

## 2018-05-06 NOTE — Telephone Encounter (Signed)
Rx has been sent  

## 2018-05-07 ENCOUNTER — Other Ambulatory Visit: Payer: Self-pay

## 2018-05-07 MED ORDER — INSULIN ASPART 100 UNIT/ML FLEXPEN: 10.0000 [IU] | PEN_INJECTOR | Freq: Three times a day (TID) | SUBCUTANEOUS | 5 refills | Status: DC

## 2018-06-05 ENCOUNTER — Other Ambulatory Visit: Payer: Self-pay

## 2018-06-05 MED ORDER — INSULIN GLARGINE 100 UNIT/ML SOLOSTAR PEN: PEN_INJECTOR | SUBCUTANEOUS | 11 refills | Status: DC

## 2018-06-06 ENCOUNTER — Telehealth: Payer: Self-pay | Admitting: Internal Medicine

## 2018-06-06 ENCOUNTER — Ambulatory Visit: Payer: Medicaid Other | Admitting: Internal Medicine

## 2018-06-06 NOTE — Telephone Encounter (Signed)
Patient no showed today's appt. Please advise on how to follow up. °A. No follow up necessary. °B. Follow up urgent. Contact patient immediately. °C. Follow up necessary. Contact patient and schedule visit in ___ days. °D. Follow up advised. Contact patient and schedule visit in ____weeks. ° °Would you like the NS fee to be applied to this visit? ° °

## 2018-06-09 NOTE — Telephone Encounter (Signed)
3mo

## 2018-06-13 ENCOUNTER — Ambulatory Visit: Payer: Medicaid Other | Admitting: Family Medicine

## 2018-06-19 ENCOUNTER — Encounter: Payer: Self-pay | Admitting: Family Medicine

## 2018-06-19 ENCOUNTER — Ambulatory Visit (INDEPENDENT_AMBULATORY_CARE_PROVIDER_SITE_OTHER): Payer: Self-pay | Admitting: Family Medicine

## 2018-06-19 ENCOUNTER — Other Ambulatory Visit: Payer: Self-pay | Admitting: Family Medicine

## 2018-06-19 VITALS — BP 115/73 | HR 91 | Temp 98.6°F | Resp 16 | Ht 65.0 in | Wt 267.0 lb

## 2018-06-19 DIAGNOSIS — M545 Low back pain, unspecified: Secondary | ICD-10-CM

## 2018-06-19 LAB — POCT URINALYSIS DIPSTICK
Bilirubin, UA: NEGATIVE
Blood, UA: NEGATIVE
Glucose, UA: POSITIVE — AB
Ketones, UA: NEGATIVE
Leukocytes, UA: NEGATIVE
Nitrite, UA: NEGATIVE
Protein, UA: NEGATIVE
Spec Grav, UA: 1.02 (ref 1.010–1.025)
Urobilinogen, UA: 0.2 E.U./dL
pH, UA: 7 (ref 5.0–8.0)

## 2018-06-19 MED ORDER — CYCLOBENZAPRINE HCL 10 MG PO TABS: 10.0000 mg | ORAL_TABLET | Freq: Three times a day (TID) | ORAL | 0 refills | Status: DC | PRN

## 2018-06-19 MED ORDER — OXYCODONE-ACETAMINOPHEN 7.5-325 MG PO TABS: 1.0000 | ORAL_TABLET | ORAL | 0 refills | Status: AC | PRN

## 2018-06-19 MED ORDER — NAPROXEN 500 MG PO TABS: 500.0000 mg | ORAL_TABLET | Freq: Two times a day (BID) | ORAL | 0 refills | Status: DC | PRN

## 2018-06-19 NOTE — Patient Instructions (Signed)
Sciatica    Sciatica is pain, numbness, weakness, or tingling along your sciatic nerve. The sciatic nerve starts in the lower back and goes down the back of each leg. Sciatica happens when this nerve is pinched or has pressure put on it. Sciatica usually goes away on its own or with treatment. Sometimes, sciatica may keep coming back (recur).  Follow these instructions at home:  Medicines  · Take over-the-counter and prescription medicines only as told by your doctor.  · Do not drive or use heavy machinery while taking prescription pain medicine.  Managing pain  · If directed, put ice on the affected area.  ? Put ice in a plastic bag.  ? Place a towel between your skin and the bag.  ? Leave the ice on for 20 minutes, 2-3 times a day.  · After icing, apply heat to the affected area before you exercise or as often as told by your doctor. Use the heat source that your doctor tells you to use, such as a moist heat pack or a heating pad.  ? Place a towel between your skin and the heat source.  ? Leave the heat on for 20-30 minutes.  ? Remove the heat if your skin turns bright red. This is especially important if you are unable to feel pain, heat, or cold. You may have a greater risk of getting burned.  Activity  · Return to your normal activities as told by your doctor. Ask your doctor what activities are safe for you.  ? Avoid activities that make your sciatica worse.  · Take short rests during the day. Rest in a lying or standing position. This is usually better than sitting to rest.  ? When you rest for a long time, do some physical activity or stretching between periods of rest.  ? Avoid sitting for a long time without moving. Get up and move around at least one time each hour.  · Exercise and stretch regularly, as told by your doctor.  · Do not lift anything that is heavier than 10 lb (4.5 kg) while you have symptoms of sciatica.  ? Avoid lifting heavy things even when you do not have symptoms.  ? Avoid lifting  heavy things over and over.  · When you lift objects, always lift in a way that is safe for your body. To do this, you should:  ? Bend your knees.  ? Keep the object close to your body.  ? Avoid twisting.  General instructions  · Use good posture.  ? Avoid leaning forward when you are sitting.  ? Avoid hunching over when you are standing.  · Stay at a healthy weight.  · Wear comfortable shoes that support your feet. Avoid wearing high heels.  · Avoid sleeping on a mattress that is too soft or too hard. You might have less pain if you sleep on a mattress that is firm enough to support your back.  · Keep all follow-up visits as told by your doctor. This is important.  Contact a doctor if:  · You have pain that:  ? Wakes you up when you are sleeping.  ? Gets worse when you lie down.  ? Is worse than the pain you have had in the past.  ? Lasts longer than 4 weeks.  · You lose weight for without trying.  Get help right away if:  · You cannot control when you pee (urinate) or poop (have a bowel movement).  · You   have weakness in any of these areas and it gets worse.  ? Lower back.  ? Lower belly (pelvis).  ? Butt (buttocks).  ? Legs.  · You have redness or swelling of your back.  · You have a burning feeling when you pee.  This information is not intended to replace advice given to you by your health care provider. Make sure you discuss any questions you have with your health care provider.  Document Released: 01/17/2008 Document Revised: 09/15/2015 Document Reviewed: 12/17/2014  Elsevier Interactive Patient Education © 2019 [employer]

## 2018-06-19 NOTE — Progress Notes (Signed)
  Patient [facility]   Progress Note: Sick Visit Provider: Lanae Boast, FNP  SUBJECTIVE:   Lori Shea is a 43 y.o. female who  has a past medical history of Allergy, Asthma (age 32 yo), Cholelithiasis (age 25 yo), Diabetes mellitus (2012), Elevated liver enzymes (09/13/2016), Hypertension (2015), Kidney stones, Migraines (2009), Morbid obesity ([place]), and PCOS (polycystic ovarian syndrome) (2010).. Patient presents today for Back Pain (right side lower back pain ) Back pain x 1 week due to overuse. History of sciatica. Was adjusted x 3 times by chiropractor with mild relief. Patient states that she has been using moist heat and ice, naproxen and percocet with relief.  Review of Systems  Constitutional: Negative.   Genitourinary: Negative.   Musculoskeletal: Positive for back pain.  Skin: Negative.   Neurological: Negative.   Psychiatric/Behavioral: Negative.      OBJECTIVE: BP 115/73 (BP Location: Left Arm, Patient Position: Sitting, Cuff Size: Large)   Pulse 91   Temp 98.6 F (37 C) (Oral)   Resp 16   Ht 5\' 5"  (1.651 m)   Wt 267 lb (121.1 kg)   LMP 05/26/2018   SpO2 99%   BMI 44.43 kg/m   Wt Readings from Last 3 Encounters:  06/19/18 267 lb (121.1 kg)  04/25/18 265 lb (120.2 kg)  03/28/18 269 lb (122 kg)     Physical Exam  ASSESSMENT/PLAN:   1. Acute right-sided low back pain, unspecified whether sciatica present - Urinalysis Dipstick - cyclobenzaprine (FLEXERIL) 10 MG tablet; Take 1 tablet (10 mg total) by mouth 3 (three) times daily as needed for muscle spasms.  Dispense: 30 tablet; Refill: 0 - naproxen (NAPROSYN) 500 MG tablet; Take 1 tablet (500 mg total) by mouth 2 (two) times daily as needed for headache.  Dispense: 30 tablet; Refill: 0 - oxyCODONE-acetaminophen (PERCOCET) 7.5-325 MG tablet; Take 1 tablet by mouth every 4 (four) hours as needed for up to 7 days for severe pain.  Dispense: 30 tablet; Refill:  0         The patient was given clear instructions to go to ER or return to medical center if symptoms do not improve, worsen or new problems develop. The patient verbalized understanding and agreed with plan of care.   Ms. Doug Sou. Nathaneil Canary, FNP-BC [facility] [address] [place], [place] [address] [phone]     This note has been created with Dragon speech recognition software and smart phrase technology. Any transcriptional errors are unintentional.

## 2018-06-23 DIAGNOSIS — M545 Low back pain, unspecified: Secondary | ICD-10-CM

## 2018-06-23 NOTE — Telephone Encounter (Signed)
I put in an xray order for her spine. An Mri would probably show more such as a herniated disc. Most insurance companies would like to do a xray and then MRI.

## 2018-06-24 ENCOUNTER — Ambulatory Visit (HOSPITAL_COMMUNITY)
Admission: RE | Admit: 2018-06-24 | Discharge: 2018-06-24 | Disposition: A | Discharge status: Home / Self Care | Payer: Medicaid Other | Source: Ambulatory Visit | Attending: Family Medicine | Admitting: Family Medicine

## 2018-06-24 DIAGNOSIS — M545 Low back pain, unspecified: Secondary | ICD-10-CM

## 2018-06-25 ENCOUNTER — Other Ambulatory Visit: Payer: Self-pay | Admitting: Family Medicine

## 2018-06-25 DIAGNOSIS — K219 Gastro-esophageal reflux disease without esophagitis: Secondary | ICD-10-CM

## 2018-07-21 ENCOUNTER — Other Ambulatory Visit: Payer: Self-pay | Admitting: Family Medicine

## 2018-07-21 DIAGNOSIS — J4530 Mild persistent asthma, uncomplicated: Secondary | ICD-10-CM

## 2018-08-15 ENCOUNTER — Telehealth: Payer: Self-pay

## 2018-08-15 DIAGNOSIS — G4700 Insomnia, unspecified: Secondary | ICD-10-CM

## 2018-08-18 MED ORDER — AMITRIPTYLINE HCL 25 MG PO TABS: 25.0000 mg | ORAL_TABLET | Freq: Every day | ORAL | 5 refills | Status: DC

## 2018-08-18 MED ORDER — MELOXICAM 15 MG PO TABS: 15.0000 mg | ORAL_TABLET | Freq: Every day | ORAL | 3 refills | Status: DC

## 2018-08-18 NOTE — Telephone Encounter (Signed)
Refills sent into pharmacy. Thanks!  

## 2018-08-21 ENCOUNTER — Ambulatory Visit (INDEPENDENT_AMBULATORY_CARE_PROVIDER_SITE_OTHER): Payer: Medicaid Other | Admitting: Family Medicine

## 2018-08-21 ENCOUNTER — Other Ambulatory Visit: Payer: Self-pay

## 2018-08-21 ENCOUNTER — Encounter: Payer: Self-pay | Admitting: Family Medicine

## 2018-08-21 VITALS — BP 103/52 | HR 80 | Temp 98.8°F | Resp 16 | Ht 65.0 in | Wt 264.0 lb

## 2018-08-21 DIAGNOSIS — Z3202 Encounter for pregnancy test, result negative: Secondary | ICD-10-CM

## 2018-08-21 DIAGNOSIS — Z30011 Encounter for initial prescription of contraceptive pills: Secondary | ICD-10-CM | POA: Diagnosis not present

## 2018-08-21 LAB — POCT URINALYSIS DIPSTICK
Bilirubin, UA: NEGATIVE
Blood, UA: NEGATIVE
Glucose, UA: POSITIVE — AB
Nitrite, UA: NEGATIVE
Protein, UA: NEGATIVE
Spec Grav, UA: 1.02 (ref 1.010–1.025)
Urobilinogen, UA: 0.2 E.U./dL
pH, UA: 5.5 (ref 5.0–8.0)

## 2018-08-21 LAB — POCT URINE PREGNANCY: Preg Test, Ur: NEGATIVE

## 2018-08-21 MED ORDER — NORETHINDRONE 0.35 MG PO TABS: 1.0000 | ORAL_TABLET | Freq: Every day | ORAL | 3 refills | Status: DC

## 2018-08-21 NOTE — Patient Instructions (Signed)
Norethindrone tablets (contraception) What is this medicine? NORETHINDRONE (nor eth IN drone) is an oral contraceptive. The product contains a female hormone known as a progestin. It is used to prevent pregnancy. This medicine may be used for other purposes; ask your health care provider or pharmacist if you have questions. COMMON BRAND NAME(S): Camila, Deblitane 28-Day, Errin, Heather, [place], Jolivette, [place], Nor-QD, Nora-BE, Norlyroc, Ortho Micronor, [employer] 28-Day What should I tell my health care provider before I take this medicine? They need to know if you have any of these conditions: -blood vessel disease or blood clots -breast, cervical, or vaginal cancer -diabetes -heart disease -kidney disease -liver disease -mental depression -migraine -seizures -stroke -vaginal bleeding -an unusual or allergic reaction to norethindrone, other medicines, foods, dyes, or preservatives -pregnant or trying to get pregnant -breast-feeding How should I use this medicine? Take this medicine by mouth with a glass of water. You may take it with or without food. Follow the directions on the prescription label. Take this medicine at the same time each day and in the order directed on the package. Do not take your medicine more often than directed. Contact your pediatrician regarding the use of this medicine in children. Special care may be needed. This medicine has been used in female children who have started having menstrual periods. A patient package insert for the product will be given with each prescription and refill. Read this sheet carefully each time. The sheet may change frequently. Overdosage: If you think you have taken too much of this medicine contact a poison control center or emergency room at once. NOTE: This medicine is only for you. Do not share this medicine with others. What if I miss a dose? Try not to miss a dose. Every time you miss a dose or take a dose late your chance of  pregnancy increases. When 1 pill is missed (even if only 3 hours late), take the missed pill as soon as possible and continue taking a pill each day at the regular time (use a back up method of birth control for the next 48 hours). If more than 1 dose is missed, use an additional birth control method for the rest of your pill pack until menses occurs. Contact your health care professional if more than 1 dose has been missed. What may interact with this medicine? Do not take this medicine with any of the following medications: -amprenavir or fosamprenavir -bosentan This medicine may also interact with the following medications: -antibiotics or medicines for infections, especially rifampin, rifabutin, rifapentine, and griseofulvin, and possibly penicillins or tetracyclines -aprepitant -barbiturate medicines, such as phenobarbital -carbamazepine -felbamate -modafinil -oxcarbazepine -phenytoin -ritonavir or other medicines for HIV infection or AIDS -St. John's wort -topiramate This list may not describe all possible interactions. Give your health care provider a list of all the medicines, herbs, non-prescription drugs, or dietary supplements you use. Also tell them if you smoke, drink alcohol, or use illegal drugs. Some items may interact with your medicine. What should I watch for while using this medicine? Visit your doctor or health care professional for regular checks on your progress. You will need a regular breast and pelvic exam and Pap smear while on this medicine. Use an additional method of birth control during the first cycle that you take these tablets. If you have any reason to think you are pregnant, stop taking this medicine right away and contact your doctor or health care professional. If you are taking this medicine for hormone related problems, it  method of birth control during the first cycle that you take these tablets.  If you have any reason to think you are pregnant, stop taking this medicine right away and contact your doctor or health care professional.  If you are taking this medicine for hormone related problems, it may take several cycles of use to see improvement in your condition.  This medicine does not protect you against HIV infection (AIDS)  or any other sexually transmitted diseases.  What side effects may I notice from receiving this medicine?  Side effects that you should report to your doctor or health care professional as soon as possible:  -breast tenderness or discharge  -pain in the abdomen, chest, groin or leg  -severe headache  -skin rash, itching, or hives  -sudden shortness of breath  -unusually weak or tired  -vision or speech problems  -yellowing of skin or eyes  Side effects that usually do not require medical attention (report to your doctor or health care professional if they continue or are bothersome):  -changes in sexual desire  -change in menstrual flow  -facial hair growth  -fluid retention and swelling  -headache  -irritability  -nausea  -weight gain or loss  This list may not describe all possible side effects. Call your doctor for medical advice about side effects. You may report side effects to FDA at 1-800-FDA-1088.  Where should I keep my medicine?  Keep out of the reach of children.  Store at room temperature between 15 and 30 degrees C (59 and 86 degrees F). Throw away any unused medicine after the expiration date.  NOTE: This sheet is a summary. It may not cover all possible information. If you have questions about this medicine, talk to your doctor, pharmacist, or health care provider.  © 2019 Elsevier/Gold Standard (2011-12-28 16:41:35)

## 2018-08-21 NOTE — Progress Notes (Signed)
  Patient [facility]   Progress Note: General Provider: Lanae Boast, FNP  SUBJECTIVE:   Lori Shea is a 43 y.o. female who  has a past medical history of Allergy, Asthma (age 24 yo), Cholelithiasis (age 63 yo), Diabetes mellitus (2012), Elevated liver enzymes (09/13/2016), Hypertension (2015), Kidney stones, Migraines (2009), Morbid obesity ([place]), and PCOS (polycystic ovarian syndrome) (2010).. Patient presents today for Contraception  Patient presents for contraception management. Currently sexually active with one female partner. She is not on any form of contraception. She was instructed that she will need progesterion only due to her HTN and migraines. She does not want the IUD or implant.   Review of Systems  Constitutional: Negative.   HENT: Negative.   Eyes: Negative.   Respiratory: Negative.   Cardiovascular: Negative.   Gastrointestinal: Negative.   Genitourinary: Negative.   Musculoskeletal: Negative.   Skin: Negative.   Neurological: Negative.   Psychiatric/Behavioral: Negative.      OBJECTIVE: BP (!) 103/52 (BP Location: Left Arm, Patient Position: Sitting, Cuff Size: Large)   Pulse 80   Temp 98.8 F (37.1 C) (Oral)   Resp 16   Ht 5\' 5"  (1.651 m)   Wt 264 lb (119.7 kg)   LMP 07/30/2018   SpO2 97%   BMI 43.93 kg/m   Wt Readings from Last 3 Encounters:  09/12/18 259 lb (117.5 kg)  08/21/18 264 lb (119.7 kg)  06/19/18 267 lb (121.1 kg)     Physical Exam Vitals signs and nursing note reviewed.  Constitutional:      General: She is not in acute distress.    Appearance: She is well-developed.  HENT:     Head: Normocephalic and atraumatic.  Eyes:     Conjunctiva/sclera: Conjunctivae normal.     Pupils: Pupils are equal, round, and reactive to light.  Neck:     Musculoskeletal: Normal range of motion.  Cardiovascular:     Rate and Rhythm: Normal rate and regular rhythm.     Heart sounds: Normal heart sounds.   Pulmonary:     Effort: Pulmonary effort is normal. No respiratory distress.     Breath sounds: Normal breath sounds.  Abdominal:     General: Bowel sounds are normal. There is no distension.     Palpations: Abdomen is soft.  Musculoskeletal: Normal range of motion.  Skin:    General: Skin is warm and dry.  Neurological:     Mental Status: She is alert and oriented to person, place, and time.  Psychiatric:        Behavior: Behavior normal.        Thought Content: Thought content normal.     ASSESSMENT/PLAN:   1. Encounter for initial prescription of contraceptive pills - Urinalysis Dipstick - norethindrone (ORTHO MICRONOR) 0.35 MG tablet; Take 1 tablet (0.35 mg total) by mouth daily.  Dispense: 3 Package; Refill: 3 - POCT urine pregnancy    Return if symptoms worsen or fail to improve.    The patient was given clear instructions to go to ER or return to medical center if symptoms do not improve, worsen or new problems develop. The patient verbalized understanding and agreed with plan of care.   Ms. Doug Sou. Nathaneil Canary, FNP-BC [facility] [address] [place], [place] [address] [phone]

## 2018-08-25 ENCOUNTER — Encounter: Payer: Self-pay | Admitting: Physician Assistant

## 2018-08-25 ENCOUNTER — Telehealth: Payer: Medicaid Other | Admitting: Physician Assistant

## 2018-08-25 DIAGNOSIS — J3489 Other specified disorders of nose and nasal sinuses: Secondary | ICD-10-CM

## 2018-08-25 DIAGNOSIS — R1310 Dysphagia, unspecified: Secondary | ICD-10-CM

## 2018-08-25 MED ORDER — AMOXICILLIN-POT CLAVULANATE 875-125 MG PO TABS: 1.0000 | ORAL_TABLET | Freq: Two times a day (BID) | ORAL | 0 refills | Status: AC

## 2018-08-25 MED ORDER — FLUTICASONE PROPIONATE 50 MCG/ACT NA SUSP: 2.0000 | Freq: Every day | NASAL | 0 refills | Status: DC

## 2018-08-25 NOTE — Progress Notes (Signed)
We are sorry that you are not feeling well.  Here is how we plan to help!  Based on what you have shared with me it is likely that you have  pharyngitis.  Pharyngitis is inflammation and infection in the back of the throat.    This infection may be caused by virus. However, I have prescribed antibiotics that I DONT recommend you start today. Since your symptoms have only been for one day, try supporative measures such as throat lozenges and if your symptoms dont improve or worse, start the prescribed antibiotic. I have also prescribed Flonase 2 sprays in each nostril once daily, this should help with your nasal symptoms.   I have also provided a work note   The prescribed antibiotic is Augmentin 875 mg/ 125 mg one tablet twice daily for 7 days. For throat pain, we recommend over the counter oral pain relief medications such as acetaminophen or aspirin, or anti-inflammatory medications such as ibuprofen or naproxen sodium. Topical treatments such as oral throat lozenges or sprays may be used as needed. Strep infections are not as easily transmitted as other respiratory infections, however we still recommend that you avoid close contact with loved ones, especially the very young and elderly.  Remember to wash your hands thoroughly throughout the day as this is the number one way to prevent the spread of infection and wipe down door knobs and counters with disinfectant.   Home Care:  Only take medications as instructed by your medical team.  Complete the entire course of an antibiotic.  Do not take these medications with alcohol.  A steam or ultrasonic humidifier can help congestion.  You can place a towel over your head and breathe in the steam from hot water coming from a faucet.  Avoid close contacts especially the very young and the elderly.  Cover your mouth when you cough or sneeze.  Always remember to wash your hands.  Get Help Right Away If:  You develop worsening fever or sinus  pain.  You develop a severe head ache or visual changes.  Your symptoms persist after you have completed your treatment plan.  Make sure you  Understand these instructions.  Will watch your condition.  Will get help right away if you are not doing well or get worse.  Your e-visit answers were reviewed by a board certified advanced clinical practitioner to complete your personal care plan.  Depending on the condition, your plan could have included both over the counter or prescription medications.  If there is a problem please reply  once you have received a response from your provider.  Your safety is important to [place].  If you have drug allergies check your prescription carefully.    You can use MyChart to ask questions about today's visit, request a non-urgent call back, or ask for a work or school excuse for 24 hours related to this e-Visit. If it has been greater than 24 hours you will need to follow up with your provider, or enter a new e-Visit to address those concerns.  You will get an e-mail in the next two days asking about your experience.  I hope that your e-visit has been valuable and will speed your recovery. Thank you for using e-visits.   I have spent 7 min in completion and review of this note- Lacy Duverney [facility]

## 2018-08-28 ENCOUNTER — Other Ambulatory Visit: Payer: Self-pay | Admitting: Family Medicine

## 2018-08-28 DIAGNOSIS — I10 Essential (primary) hypertension: Secondary | ICD-10-CM

## 2018-08-28 NOTE — Telephone Encounter (Signed)
Message sent to provider 

## 2018-09-01 ENCOUNTER — Other Ambulatory Visit: Payer: Self-pay | Admitting: Obstetrics and Gynecology

## 2018-09-04 ENCOUNTER — Other Ambulatory Visit: Payer: Self-pay

## 2018-09-04 ENCOUNTER — Ambulatory Visit (HOSPITAL_COMMUNITY)
Admission: EM | Admit: 2018-09-04 | Discharge: 2018-09-04 | Disposition: A | Discharge status: Home / Self Care | Payer: Medicaid Other | Attending: Family Medicine | Admitting: Family Medicine

## 2018-09-04 ENCOUNTER — Encounter (HOSPITAL_COMMUNITY): Payer: Self-pay | Admitting: Emergency Medicine

## 2018-09-04 DIAGNOSIS — J029 Acute pharyngitis, unspecified: Secondary | ICD-10-CM | POA: Insufficient documentation

## 2018-09-04 LAB — POCT RAPID STREP A: Streptococcus, Group A Screen (Direct): NEGATIVE

## 2018-09-04 LAB — POCT INFECTIOUS MONO SCREEN: Mono Screen: NEGATIVE

## 2018-09-04 MED ORDER — PREDNISONE 10 MG (21) PO TBPK: ORAL_TABLET | Freq: Every day | ORAL | 0 refills | Status: DC

## 2018-09-04 NOTE — Discharge Instructions (Signed)
You may use over the counter ibuprofen or acetaminophen as needed.  For a sore throat, over the counter products such as Colgate Peroxyl Mouth Sore Rinse or Chloraseptic Sore Throat Spray may provide some temporary relief. Your rapid strep test was negative today. We have sent your throat swab for culture and will let you know of any positive results. 

## 2018-09-04 NOTE — ED Triage Notes (Signed)
Pt states she was given augmentin during a e visit earlier this month and finished her antibiotics. States white spots and sore throat on L side of mouth, states that went away but now its come back on the right side of her throat.

## 2018-09-07 LAB — CULTURE, GROUP A STREP (THRC)

## 2018-09-09 NOTE — ED Provider Notes (Signed)
[place]   [id number] 09/04/18 Arrival Time: 4034  ASSESSMENT & PLAN:  1. Sore throat    No signs of peritonsillar abscess. Discussed.  Given amount of discomfort and irritation of throat/tonsils, trial of: Meds ordered this encounter  Medications  . predniSONE (STERAPRED UNI-PAK 21 TAB) 10 MG (21) TBPK tablet    Sig: Take by mouth daily. Take as directed.    Dispense:  21 tablet    Refill:  0    Infectious mono screen, POC  Result Value Ref Range   Mono Screen NEGATIVE NEGATIVE   Rapid strep negative. Culture sent. Discussed possibility of viral throat infection.   Discharge Instructions      You may use over the counter ibuprofen or acetaminophen as needed.   For a sore throat, over the counter products such as Colgate Peroxyl Mouth Sore Rinse or Chloraseptic Sore Throat Spray may provide some temporary relief.  Your rapid strep test was negative today. We have sent your throat swab for culture and will let you know of any positive results.   Reviewed expectations re: course of current medical issues. Questions answered. Outlined signs and symptoms indicating need for more acute intervention. Patient verbalized understanding. After Visit Summary given.   SUBJECTIVE:  Lori Shea is a 43 y.o. female who reports a sore throat. Describes as pain with swallowing; R>L. Onset gradual beginning a few days ago. No respiratory symptoms. Normal PO intake but reports discomfort with swallowing. Fever reported: no. No neck pain or swelling. No associated n/v/abdominal symptoms. Sick contacts: none known. Reports being treated with Augmentin recently for similar symptoms.  OTC treatment: analgesics with minimal help.  ROS: As per HPI. All other systems negative.   OBJECTIVE:  Vitals:   09/04/18 1557  BP: 118/72  Pulse: 72  Resp: 16  Temp: 98.2 F (36.8 C)  SpO2: 100%     General appearance: alert; no distress but does appear uncomfortable  HEENT: throat with tonsillar hypertrophy and moderate erythema; without exudates; uvula midline: yes Neck: supple with FROM; no lymphadenopathy CV: RRR Lungs: clear to auscultation bilaterally Abd: soft; non-tender Skin: reveals no rash; warm and dry Psychological: alert and cooperative; normal mood and affect  Allergies  Allergen Reactions  . Corn-Containing Products Hives    "really bad asthma attacks"  . Honey Shortness Of Breath  . 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 Victoza [Liraglutide] Nausea And Vomiting    Past Medical History:  Diagnosis Date  . Allergy    Foods:  mango, honey, corn.  Allergic to "everything tested"  15 years ago in [place]  . Asthma age 85 yo   Triggers:  cold weather, exercise, allergens, anxiety, URIs  . Cholelithiasis age 38 yo   Asymptomatic  . Diabetes mellitus 2012   Was diagnosed 2 years earlier with PCOS  . Elevated liver enzymes 09/13/2016  . Hypertension 2015  . Kidney stones   . Migraines 2009  . Morbid obesity ([place])   . PCOS (polycystic ovarian syndrome) 2010   Social History   Socioeconomic History  . Marital status: Divorced    Spouse name: Not on file  . Number of children: 1  . Years of education: some comm. college  . Highest education level: Not on file  Occupational History  . Occupation: CNA    Comment: Veterinary surgeon  . Financial resource strain: Not on file  . Food insecurity:    Worry:  Not on file    Inability: Not on file  . Transportation needs:    Medical: Not on file    Non-medical: Not on file  Tobacco Use  . Smoking status: Never Smoker  . Smokeless tobacco: Never Used  Substance and Sexual Activity  . Alcohol use: No  . Drug use: No  . Sexual activity: Yes    Birth control/protection: None    Comment: Sprintec  Lifestyle  . Physical activity:    Days per week: Not on file    Minutes per session: Not on file  . Stress: Not on file  Relationships  . Social  connections:    Talks on phone: Not on file    Gets together: Not on file    Attends religious service: Not on file    Active member of club or organization: Not on file    Attends meetings of clubs or organizations: Not on file    Relationship status: Not on file  . Intimate partner violence:    Fear of current or ex partner: Not on file    Emotionally abused: Not on file    Physically abused: Not on file    Forced sexual activity: Not on file  Other Topics Concern  . Not on file  Social History Narrative   Originally from [place], [place] to [place] in 1988.   In [place] in [place] at home with daughter on [place].   Family History  Problem Relation Age of Onset  . Diabetes Father   . Hyperlipidemia Father   . Hypertension Father   . Heart disease Father   . Kidney disease Father        Developed after 2nd CABG  . Diabetes Paternal Grandfather   . Heart disease Paternal Grandfather   . Hyperlipidemia Paternal Grandfather   . Hypertension Paternal Grandfather   . Kidney disease Paternal Reymundo Poll, MD 09/09/18 1059

## 2018-09-12 ENCOUNTER — Other Ambulatory Visit: Payer: Self-pay

## 2018-09-12 ENCOUNTER — Ambulatory Visit (INDEPENDENT_AMBULATORY_CARE_PROVIDER_SITE_OTHER): Payer: Self-pay | Admitting: Family Medicine

## 2018-09-12 ENCOUNTER — Telehealth: Payer: Medicaid Other | Admitting: Nurse Practitioner

## 2018-09-12 VITALS — BP 107/67 | HR 94 | Temp 98.9°F | Resp 16 | Ht 65.0 in | Wt 259.0 lb

## 2018-09-12 DIAGNOSIS — J029 Acute pharyngitis, unspecified: Secondary | ICD-10-CM

## 2018-09-12 DIAGNOSIS — R070 Pain in throat: Secondary | ICD-10-CM

## 2018-09-12 MED ORDER — NYSTATIN 100000 UNIT/ML MT SUSP: 5.0000 mL | Freq: Four times a day (QID) | OROMUCOSAL | 0 refills | Status: AC

## 2018-09-12 MED ORDER — FLUCONAZOLE 150 MG PO TABS: 150.0000 mg | ORAL_TABLET | ORAL | 0 refills | Status: DC

## 2018-09-12 NOTE — Progress Notes (Signed)
Based on what you shared with me it looks like you have an issue ,that should be evaluated in a face to face office visit. With negative strep and mono test you will need a face to face for proper treatment. Without a fevr it is unlike that an antibiotic will help. Providers prescribe antibiotics to treat infections caused by bacteria. Antibiotics are very powerful in treating bacterial infections when they are used properly. To maintain their effectiveness, they should be used only when necessary. Overuse of antibiotics has resulted in the development of superbugs that are resistant to treatment!    After careful review of your answers, I would not recommend an antibiotic for your condition.  Antibiotics are not effective against viruses and therefore should not be used to treat them. Common examples of infections caused by viruses include colds and flu     NOTE: If you entered your credit card information for this eVisit, you will not be charged. You may see a "hold" on your card for the $30 but that hold will drop off and you will not have a charge processed.  If you are having a true medical emergency please call 911.  If you need an urgent face to face visit, [facility] has four urgent care centers for your convenience.  If you need care fast and have a high deductible or no insurance consider:   [email or web address] to reserve your spot online an avoid wait times  [facility] [address] [place], [place] [address] 8 am to 8 pm Monday-Friday 10 am to 4 pm Saturday-Sunday *Across the street from [employer]  [place], [place], [address] 8 am to 5 pm Monday-Friday * In the [facility] on the [facility]   The following sites will take your  insurance:  . [facility] [place] a Provider at this Location  [address] [place], [place] [address] .  10 am to 8 pm Monday-Friday . 12 pm to 8 pm Saturday-Sunday   . [facility] at [place] a Provider at this Location  [place], [place], [place] [address] . 8 am to 8 pm Monday-Friday . 9 am to 6 pm Saturday . 11 am to 6 pm Sunday   . [facility] [place] Get Driving Directions  [address] [place], [place] [address] . 8 am to 8 pm Monday-Friday . 8 am to 4 pm Saturday-Sunday   Your e-visit answers were reviewed by a board certified advanced clinical practitioner to complete your personal care plan.

## 2018-09-12 NOTE — Progress Notes (Signed)
  Patient [facility]   Progress Note: Sick Visit Provider: Lanae Boast, FNP  SUBJECTIVE:   Lori Shea is a 43 y.o. female who  has a past medical history of Allergy, Asthma (age 82 yo), Cholelithiasis (age 62 yo), Diabetes mellitus (2012), Elevated liver enzymes (09/13/2016), Hypertension (2015), Kidney stones, Migraines (2009), Morbid obesity ([place]), and PCOS (polycystic ovarian syndrome) (2010).. Patient presents today for Oral Swelling (red and swollen thorat )  Patient seen at urgent care on 09/04/2018 for sore throat. She was prescribed prednisone. She reports that the throat remains swollen, but the white spots have resolved. She states that her throat is a 7/10 today.  Review of Systems  HENT: Positive for sore throat (swelling).      OBJECTIVE: BP 107/67 (BP Location: Left Arm, Patient Position: Sitting, Cuff Size: Large)   Pulse 94   Temp 98.9 F (37.2 C) (Oral)   Resp 16   Ht 5\' 5"  (1.651 m)   Wt 259 lb (117.5 kg)   LMP 09/08/2018   SpO2 98%   BMI 43.10 kg/m   Wt Readings from Last 3 Encounters:  09/12/18 259 lb (117.5 kg)  08/21/18 264 lb (119.7 kg)  06/19/18 267 lb (121.1 kg)     Physical Exam Constitutional:      General: She is not in acute distress.    Appearance: Normal appearance. She is obese.  HENT:     Head: Normocephalic and atraumatic.     Mouth/Throat:     Lips: Pink.     Mouth: Mucous membranes are moist.     Pharynx: Oropharynx is clear. Uvula midline. No uvula swelling.     Tonsils: 2+ on the right. 2+ on the left.  Eyes:     Conjunctiva/sclera: Conjunctivae normal.     Pupils: Pupils are equal, round, and reactive to light.  Lymphadenopathy:     Head:     Right side of head: No submental or tonsillar adenopathy.     Left side of head: No submental or tonsillar adenopathy.  Neurological:     Mental Status: She is alert.     ASSESSMENT/PLAN:   1. Throat pain No clear sign of infection.  There is exudate on the tongue. Treat for thrush. Rapid strep negative.  - Rapid Strep A - Strep A DNA Probe - nystatin (MYCOSTATIN) 100000 UNIT/ML suspension; Take 5 mLs (500,000 Units total) by mouth 4 (four) times daily for 7 days. Swish and swallow  Dispense: 60 mL; Refill: 0 - fluconazole (DIFLUCAN) 150 MG tablet; Take 1 tablet (150 mg total) by mouth every 3 (three) days. Take one tablet now and repeat in 3 days  Dispense: 2 tablet; Refill: 0       The patient was given clear instructions to go to ER or return to medical center if symptoms do not improve, worsen or new problems develop. The patient verbalized understanding and agreed with plan of care.   Ms. Doug Sou. Nathaneil Canary, FNP-BC [facility] [address] [place], [place] [address] [phone]     This note has been created with Dragon speech recognition software and smart phrase technology. Any transcriptional errors are unintentional.

## 2018-09-12 NOTE — Patient Instructions (Signed)
Oral Thrush, Adult  Oral thrush is an infection in your mouth and throat. It causes white patches on your tongue and in your mouth. Follow these instructions at home: Helping with soreness   To lessen your pain: ? Drink cold liquids, like water and iced tea. ? Eat frozen ice pops or frozen juices. ? Eat foods that are easy to swallow, like gelatin and ice cream. ? Drink from a straw if the patches in your mouth are painful. General instructions  Take or use over-the-counter and prescription medicines only as told by your doctor. Medicine for oral thrush may be something to swallow, or it may be something to put on the infected area.  Eat plain yogurt that has live cultures in it. Read the label to make sure.  If you wear dentures: ? Take out your dentures before you go to bed. ? Brush them well. ? Soak them in a denture cleaner.  Rinse your mouth with warm salt-water many times a day. To make the salt-water mixture, completely dissolve 1/2-1 teaspoon of salt in 1 cup of warm water. Contact a doctor if:  Your problems are getting worse.  Your problems do not get better in less than 7 days with treatment.  Your infection is spreading. This may show as white patches on the skin outside of your mouth.  You are nursing your baby and you have redness and pain in the nipples. This information is not intended to replace advice given to you by your health care provider. Make sure you discuss any questions you have with your health care provider. Document Released: 07/04/2009 Document Revised: 01/02/2016 Document Reviewed: 01/02/2016 Elsevier Interactive Patient Education  2019 Elsevier Inc.  

## 2018-09-17 ENCOUNTER — Telehealth: Payer: Self-pay

## 2018-09-17 ENCOUNTER — Other Ambulatory Visit: Payer: Self-pay

## 2018-09-17 DIAGNOSIS — J029 Acute pharyngitis, unspecified: Secondary | ICD-10-CM

## 2018-09-17 DIAGNOSIS — J309 Allergic rhinitis, unspecified: Secondary | ICD-10-CM

## 2018-09-17 NOTE — Telephone Encounter (Signed)
Patient called and states she is No better from last week. Her throat is still swollen and sore and she feels she is getting worse. She says she has been dealing with these symptoms for a month. Please advise what can be done now. Thanks!

## 2018-09-17 NOTE — Telephone Encounter (Signed)
Called and left message that we will send in referral to ENT. Thanks!

## 2018-09-18 NOTE — Addendum Note (Signed)
Addended by: Genelle Bal on: 09/18/2018 05:20 PM   Modules accepted: Orders

## 2018-09-19 ENCOUNTER — Ambulatory Visit: Payer: Medicaid Other | Admitting: Internal Medicine

## 2018-09-19 NOTE — Progress Notes (Deleted)
Patient ID: Lori Shea, female   DOB: [birth year], 43 y.o.   MRN: [id number]  HPI: Lori Shea is a 43 y.o.-year-old female, referred by her PCP, Lanae Boast, FNP, returning for follow-up for DM2, dx in 2012, insulin-dependent since 2016, uncontrolled, without long-term complications.  Last visit 4.5 months ago  Reviewed latest HbA1c level: Lab Results  Component Value Date   HGBA1C 11.0 (A) 03/14/2018   HGBA1C 13.2 (A) 11/22/2017   HGBA1C 11.7 07/11/2017   HGBA1C 10.2 05/09/2017   HGBA1C 9.5 04/08/2017   HGBA1C 8.8 (H) 07/19/2016   HGBA1C 7.7 (H) 02/22/2011   Pt was on a regimen of: - Metformin 1000 mg at dinner - Januvia 100 mg before b'fast  - Lantus 40 units 2x a day  - Cinnamon 1000 mg 1x a day in am She tried Victoza >> AP, vomiting. She tried Invokana >> did very well on it but stopped 2/2 negative publicity as rec'd but her Dr at that time. She tried farxiga >> yeast inf.  At last visit, we changed to: - Metformin 1000 mg with dinner - Lantus 60 units at bedtime - NovoLog 10-14 units before meals  Pt checks her sugars 0 to once a day: - am: 120, 150s, but during the Holidays: 250-287 - 2h after b'fast: n/c - before lunch: n/c - 2h after lunch: n/c - before dinner: n/c - 2h after dinner: n/c - bedtime: n/c - nighttime: n/c Lowest sugar was 120; she has hypoglycemia awareness at 70.  Highest sugar was 448.  Glucometer: ReliOn Prime  Pt's meals are: - Breakfast: oatmeal, protein shake - Lunch: varies - Dinner: meat, veggies - Snacks: 2: am and pm  -No CKD, last BUN/creatinine:  Lab Results  Component Value Date   BUN 14 03/15/2018   BUN 11 03/14/2018   CREATININE 0.69 03/15/2018   CREATININE 0.66 03/14/2018  On azilsartan.  -No HL;  last set of lipids: Lab Results  Component Value Date   CHOL 180 07/19/2016   HDL 60 07/19/2016   LDLCALC 98 07/19/2016   TRIG 109 07/19/2016   CHOLHDL 3.6 Ratio 02/09/2009  She is not on a statin.  -  last eye exam was in 2017: No DR  - no numbness and tingling in her feet. Shooting Pain in feet. Restless legs.  Pt has FH of DM in father - massive AMI at 75 >> passed away.  Patient also has a history of HTN and asthma.  ROS: Constitutional: no weight gain/no weight loss, no fatigue, no subjective hyperthermia, no subjective hypothermia Eyes: no blurry vision, no xerophthalmia ENT: no sore throat, no nodules palpated in neck, no dysphagia, no odynophagia, no hoarseness Cardiovascular: no CP/no SOB/no palpitations/no leg swelling Respiratory: no cough/no SOB/no wheezing Gastrointestinal: no N/no V/no D/no C/no acid reflux Musculoskeletal: no muscle aches/no joint aches Skin: no rashes, no hair loss Neurological: no tremors/no numbness/no tingling/no dizziness  I reviewed pt's medications, allergies, PMH, social hx, family hx, and changes were documented in the history of present illness. Otherwise, unchanged from my initial visit note.  Past Medical History:  Diagnosis Date  . Allergy    Foods:  mango, honey, corn.  Allergic to "everything tested"  15 years ago in [place]  . Asthma age 2 yo   Triggers:  cold weather, exercise, allergens, anxiety, URIs  . Cholelithiasis age 49 yo   Asymptomatic  . Diabetes mellitus 2012   Was diagnosed 2 years earlier with PCOS  . Elevated liver enzymes  09/13/2016  . Hypertension 2015  . Kidney stones   . Migraines 2009  . Morbid obesity ([place])   . PCOS (polycystic ovarian syndrome) 2010   Past Surgical History:  Procedure Laterality Date  . COLPOSCOPY N/A    Social History   Socioeconomic History  . Marital status: Divorced    Spouse name: Not on file  . Number of children: 1  . Years of education: some comm. college  . Highest education level: Not on file  Occupational History  . Occupation: CNA    Comment: Veterinary surgeon  . Financial resource strain: Not on file  . Food insecurity:    Worry: Not on file     Inability: Not on file  . Transportation needs:    Medical: Not on file    Non-medical: Not on file  Tobacco Use  . Smoking status: Never Smoker  . Smokeless tobacco: Never Used  Substance and Sexual Activity  . Alcohol use: No  . Drug use: No  . Sexual activity: Yes    Birth control/protection: None    Comment: Sprintec  Lifestyle  . Physical activity:    Days per week: Not on file    Minutes per session: Not on file  . Stress: Not on file  Relationships  . Social connections:    Talks on phone: Not on file    Gets together: Not on file    Attends religious service: Not on file    Active member of club or organization: Not on file    Attends meetings of clubs or organizations: Not on file    Relationship status: Not on file  . Intimate partner violence:    Fear of current or ex partner: Not on file    Emotionally abused: Not on file    Physically abused: Not on file    Forced sexual activity: Not on file  Other Topics Concern  . Not on file  Social History Narrative   Originally from [place], [place] to [place] in 1988.   In [place] in [place] at home with daughter on [address].   Current Outpatient Medications on File Prior to Visit  Medication Sig Dispense Refill  . ADVAIR DISKUS 100-50 MCG/DOSE AEPB INHALE 1 PUFF INTO THE LUNGS EVERY 12 HOURS. 3 each 0  . albuterol (PROVENTIL) (2.5 MG/3ML) 0.083% nebulizer solution Take 3 mLs (2.5 mg total) by nebulization every 6 (six) hours as needed for wheezing or shortness of breath. 75 mL 0  . amitriptyline (ELAVIL) 25 MG tablet Take 1 tablet (25 mg total) by mouth at bedtime. 30 tablet 5  . Black Elderberry,Berry-Flower, 575 MG CAPS Take 1 capsule by mouth daily.    . cetirizine (ZYRTEC) 10 MG tablet Take 1 tablet (10 mg total) by mouth daily. 30 tablet 11  . cyclobenzaprine (FLEXERIL) 10 MG tablet Take 1 tablet (10 mg total) by mouth 3 (three) times daily as needed for muscle spasms. 30 tablet 0   . DEXILANT 30 MG capsule TAKE 1 CAPSULE (30MG  TOTAL) BY MOUTH DAILY. TAKE ON AN EMPTY STOMACH. 90 capsule 0  . EASY COMFORT PEN NEEDLES 32G X 4 MM MISC use as directed test 2 times daily  3  . EDARBYCLOR 40-25 MG TABS TAKE 1 TABLET BY MOUTH DAILY. 90 tablet 0  . famotidine (PEPCID) 40 MG tablet Take 1 tablet (40 mg total) by mouth 2 (two) times daily for 15 days. 30 tablet  0  . fluconazole (DIFLUCAN) 150 MG tablet Take 1 tablet (150 mg total) by mouth every 3 (three) days. Take one tablet now and repeat in 3 days 2 tablet 0  . fluticasone (FLONASE) 50 MCG/ACT nasal spray Place 2 sprays into both nostrils daily. 16 g 0  . ibuprofen (ADVIL,MOTRIN) 600 MG tablet Take 1 tablet (600 mg total) by mouth every 6 (six) hours as needed. 30 tablet 0  . insulin aspart (NOVOLOG FLEXPEN) 100 UNIT/ML FlexPen Inject 10-14 Units into the skin 3 (three) times daily before meals. 30 mL 5  . Insulin Glargine (LANTUS SOLOSTAR) 100 UNIT/ML Solostar Pen Inject 60 units at bedtime. 5 pen 11  . Insulin Pen Needle (PEN NEEDLES) 32G X 5 MM MISC 100 each by Does not apply route 2 (two) times daily. 100 each 3  . Magnesium Citrate 100 MG TABS Take 100 mg by mouth every evening.    . meloxicam (MOBIC) 15 MG tablet Take 1 tablet (15 mg total) by mouth daily. 30 tablet 3  . metFORMIN (GLUCOPHAGE) 500 MG tablet Take 1 tablet (500 mg total) by mouth 2 (two) times daily with a meal. 60 tablet 5  . montelukast (SINGULAIR) 10 MG tablet Take 1 tablet (10 mg total) by mouth daily. 30 tablet 5  . Multiple Vitamins-Minerals (MULTIVITAMIN ADULT PO) Take 1 tablet by mouth daily.    . naproxen (NAPROSYN) 500 MG tablet Take 1 tablet (500 mg total) by mouth 2 (two) times daily as needed for headache. 30 tablet 0  . norethindrone (ORTHO MICRONOR) 0.35 MG tablet Take 1 tablet (0.35 mg total) by mouth daily. 3 Package 3  . nystatin (MYCOSTATIN) 100000 UNIT/ML suspension Take 5 mLs (500,000 Units total) by mouth 4 (four) times daily for 7 days.  Swish and swallow 60 mL 0  . ondansetron (ZOFRAN ODT) 4 MG disintegrating tablet Take 1 tablet (4 mg total) by mouth every 4 (four) hours as needed for nausea or vomiting. 20 tablet 0  . promethazine (PHENERGAN) 12.5 MG tablet Take 12.5 mg by mouth every 6 (six) hours as needed for nausea or vomiting.    . Psyllium (FIBER) 0.52 g CAPS Take 1 capsule by mouth daily.    . rizatriptan (MAXALT-MLT) 10 MG disintegrating tablet Take 1 tablet (10 mg total) by mouth as needed for migraine. May repeat in 2 hours if needed.  Max 30 mg/24 h 12 tablet 6  . VENTOLIN HFA 108 (90 Base) MCG/ACT inhaler INHALE 2 PUFFS INTO THE LUNGS EVERY 4 HOURS AS NEEDED FOR WHEEZING ORSHORTNESS OF BREATH. 3 each 0   No current facility-administered medications on file prior to visit.    Allergies  Allergen Reactions  . Corn-Containing Products Hives    "really bad asthma attacks"  . Honey Shortness Of Breath  . 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 Victoza [Liraglutide] Nausea And Vomiting   Family History  Problem Relation Age of Onset  . Diabetes Father   . Hyperlipidemia Father   . Hypertension Father   . Heart disease Father   . Kidney disease Father        Developed after 2nd CABG  . Diabetes Paternal Grandfather   . Heart disease Paternal Grandfather   . Hyperlipidemia Paternal Grandfather   . Hypertension Paternal Grandfather   . Kidney disease Paternal Grandfather      PE: LMP 09/08/2018  Wt Readings from Last 3 Encounters:  09/12/18 259 lb (117.5 kg)  08/21/18  264 lb (119.7 kg)  06/19/18 267 lb (121.1 kg)   Constitutional: overweight, in NAD Eyes: PERRLA, EOMI, no exophthalmos ENT: moist mucous membranes, no thyromegaly, no cervical lymphadenopathy Cardiovascular: RRR, No MRG Respiratory: CTA B Gastrointestinal: abdomen soft, NT, ND, BS+ Musculoskeletal: no deformities, strength intact in all 4 Skin: moist, warm, no rashes Neurological: no tremor with outstretched hands, DTR normal  in all 4  ASSESSMENT: 1. DM2, insulin-dependent, uncontrolled, without long-term complications, but with hyperglycemia  2. Obesity   PLAN:  1. Patient with longstanding, uncontrolled, type 2 diabetes, on oral antidiabetic regimen with metformin and also basal-bolus insulin regimen.  Latest HbA1c was very high, at 11%.  At last visit, she was not checking sugars consistently and when she did, she only checked in the morning.  We discussed about checking at different times of the day, 2-3 times a day.  At that time, sugars were much higher after the holidays so we added rapid acting insulin while decreasing the dose of her basal insulin.  I also advised her to stop Januvia when she ran out since this was not powerful enough.  At that time I also gave her advice about improving her diet. -Of note, she was on Invokana in the past but was taken off due to fear of amputations.  We discussed about the countless study at last visit and the fact that she did not meet the characteristics of the patients that actually developed this complication in the study.  She agreed to retry this.  Also, she had GI side effects to Victoza in the past but was fairly open to start a weekly GLP-1 receptor agonist.  - I suggested to:  Patient Instructions  Please continue: - Metformin 1000 mg with dinner - Lantus 60 units at bedtime - NovoLog 10-14 units before meals  Please return in 3-4 months with your sugar log.   - today, HbA1c is 7%  - continue checking sugars at different times of the day - check 1x a day, rotating checks - advised for yearly eye exams >> she is UTD - Return to clinic in 3-4 mo with sugar log   2.  Obesity -Lost 8 pounds since last visit  Philemon Kingdom, MD PhD [facility]

## 2018-09-20 ENCOUNTER — Ambulatory Visit (HOSPITAL_COMMUNITY)
Admission: EM | Admit: 2018-09-20 | Discharge: 2018-09-20 | Disposition: A | Discharge status: Home / Self Care | Payer: Medicaid Other | Attending: Family Medicine | Admitting: Family Medicine

## 2018-09-20 ENCOUNTER — Other Ambulatory Visit: Payer: Self-pay

## 2018-09-20 ENCOUNTER — Encounter (HOSPITAL_COMMUNITY): Payer: Self-pay

## 2018-09-20 DIAGNOSIS — J039 Acute tonsillitis, unspecified: Secondary | ICD-10-CM

## 2018-09-20 DIAGNOSIS — J0140 Acute pansinusitis, unspecified: Secondary | ICD-10-CM

## 2018-09-20 DIAGNOSIS — B9689 Other specified bacterial agents as the cause of diseases classified elsewhere: Secondary | ICD-10-CM

## 2018-09-20 MED ORDER — LIDOCAINE VISCOUS HCL 2 % MT SOLN: OROMUCOSAL | 0 refills | Status: DC

## 2018-09-20 MED ORDER — DEXAMETHASONE SODIUM PHOSPHATE 10 MG/ML IJ SOLN
10.0000 mg | Freq: Once | INTRAMUSCULAR | Status: AC
  Administered 2018-09-20: 17:00:00 10 mg via INTRAMUSCULAR

## 2018-09-20 MED ORDER — IPRATROPIUM BROMIDE 0.06 % NA SOLN: 2.0000 | Freq: Four times a day (QID) | NASAL | 0 refills | Status: DC

## 2018-09-20 MED ORDER — DEXAMETHASONE SODIUM PHOSPHATE 10 MG/ML IJ SOLN
INTRAMUSCULAR | Status: AC
  Filled 2018-09-20: qty 1

## 2018-09-20 MED ORDER — AMOXICILLIN-POT CLAVULANATE 875-125 MG PO TABS: 1.0000 | ORAL_TABLET | Freq: Two times a day (BID) | ORAL | 0 refills | Status: DC

## 2018-09-20 NOTE — Discharge Instructions (Signed)
Decadron injection in office today. Given appearance of tonsils and possible sinus infection, start augmentin as directed. Start lidocaine for sore throat, do not eat or drink for the next 40 mins after use as it can stunt your gag reflex. Flonase, atrovent for nasal congestion/drainage. You can use over the counter nasal saline rinse such as neti pot for nasal congestion. Monitor for any worsening of symptoms, swelling of the throat, trouble breathing, trouble swallowing, leaning forward to breath, drooling, go to the emergency department for further evaluation needed. Otherwise, follow up with ENT as scheduled for reevaluation needed.

## 2018-09-20 NOTE — ED Triage Notes (Signed)
Pt presents with recurrent tonsil inflammation, sore throat, and ear pain for over a month.  Pt states all of strep swabs and mono testing have come back negative but she has been having the same symptoms intermittently.

## 2018-09-20 NOTE — ED Provider Notes (Signed)
[facility]    CSN: [id number] Arrival date & time: 09/20/18  1603     History   Chief Complaint Chief Complaint  Patient presents with  . Tonsil Inflammation  . Otalgia    HPI YUETTE PUTNAM is a 43 y.o. female.   43 year old female with history of asthma, HTN, DM, PCOS comes in for sore throat. Symptoms has been going on for 1 month and has been seen multiple times since symptoms onset. She was given prednisone with some relief 09/02/2018. She was then seen by PCP and was given diflucan, nystatin without relief. Her strep test and culture as been negative. She has had rhinorrhea, congestion, sinus pressure, right ear pain. Has had post nasal drip. Minimal cough. Denies fever, chills, night sweats. Painful swallowing without trouble breathing, tripoding, drooling, trismus. PCP has referred patient to ENT for further evaluation.      Past Medical History:  Diagnosis Date  . Allergy    Foods:  mango, honey, corn.  Allergic to "everything tested"  15 years ago in [place]  . Asthma age 11 yo   Triggers:  cold weather, exercise, allergens, anxiety, URIs  . Cholelithiasis age 74 yo   Asymptomatic  . Diabetes mellitus 2012   Was diagnosed 2 years earlier with PCOS  . Elevated liver enzymes 09/13/2016  . Hypertension 2015  . Kidney stones   . Migraines 2009  . Morbid obesity ([place])   . PCOS (polycystic ovarian syndrome) 2010    Patient Active Problem List   Diagnosis Date Noted  . LGSIL on Pap smear of cervix 07/31/2017  . Alopecia of scalp 05/09/2017  . Pain in left toe(s) 12/27/2016  . Closed avulsion fracture of lateral malleolus of right fibula 12/27/2016  . Morbid obesity ([place]) 09/13/2016  . Elevated liver enzymes 09/13/2016  . Allergy   . Asthma   . Hypertension 04/23/2013  . Type 2 diabetes mellitus with hyperglycemia, with long-term current use of insulin ([place]) 04/23/2010  . PCOS (polycystic ovarian syndrome) 04/23/2008  . Migraines 04/24/2007     Past Surgical History:  Procedure Laterality Date  . COLPOSCOPY N/A     OB History    Gravida  4   Para  1   Term  1   Preterm      AB  3   Living  1     SAB  3   TAB      Ectopic      Multiple      Live Births               Home Medications    Prior to Admission medications   Medication Sig Start Date End Date Taking? Authorizing Provider  ADVAIR DISKUS 100-50 MCG/DOSE AEPB INHALE 1 PUFF INTO THE LUNGS EVERY 12 HOURS. 07/21/18   Lanae Boast, FNP  albuterol (PROVENTIL) (2.5 MG/3ML) 0.083% nebulizer solution Take 3 mLs (2.5 mg total) by nebulization every 6 (six) hours as needed for wheezing or shortness of breath. 08/19/17   Dorena Dew, FNP  amitriptyline (ELAVIL) 25 MG tablet Take 1 tablet (25 mg total) by mouth at bedtime. 08/18/18   Lanae Boast, FNP  amoxicillin-clavulanate (AUGMENTIN) 875-125 MG tablet Take 1 tablet by mouth every 12 (twelve) hours. 09/20/18   Tasia Catchings, Barri Neidlinger V, PA-C  Black Elderberry,Berry-Flower, 575 MG CAPS Take 1 capsule by mouth daily.    [provider]  cetirizine (ZYRTEC) 10 MG tablet Take 1 tablet (10 mg total) by  mouth daily. 04/08/17   Dorena Dew, FNP  cyclobenzaprine (FLEXERIL) 10 MG tablet Take 1 tablet (10 mg total) by mouth 3 (three) times daily as needed for muscle spasms. 06/19/18   Lanae Boast, FNP  DEXILANT 30 MG capsule TAKE 1 CAPSULE (30MG  TOTAL) BY MOUTH DAILY. TAKE ON AN EMPTY STOMACH. 06/25/18   Lanae Boast, FNP  EASY COMFORT PEN NEEDLES 32G X 4 MM MISC use as directed test 2 times daily 05/14/17   [provider]  EDARBYCLOR 40-25 MG TABS TAKE 1 TABLET BY MOUTH DAILY. 08/28/18   Lanae Boast, FNP  famotidine (PEPCID) 40 MG tablet Take 1 tablet (40 mg total) by mouth 2 (two) times daily for 15 days. 03/28/18 06/19/18  Lanae Boast, FNP  fluconazole (DIFLUCAN) 150 MG tablet Take 1 tablet (150 mg total) by mouth every 3 (three) days. Take one tablet now and repeat in 3 days 09/12/18   Lanae Boast, FNP  fluticasone [facility]) 50 MCG/ACT nasal spray Place 2 sprays into both nostrils daily. 08/25/18   Waldon Merl, PA-C  ibuprofen (ADVIL,MOTRIN) 600 MG tablet Take 1 tablet (600 mg total) by mouth every 6 (six) hours as needed. 03/15/18   Charlesetta Shanks, MD  insulin aspart (NOVOLOG FLEXPEN) 100 UNIT/ML FlexPen Inject 10-14 Units into the skin 3 (three) times daily before meals. 05/07/18   Lanae Boast, FNP  Insulin Glargine (LANTUS SOLOSTAR) 100 UNIT/ML Solostar Pen Inject 60 units at bedtime. 06/05/18   Philemon Kingdom, MD  Insulin Pen Needle (PEN NEEDLES) 32G X 5 MM MISC 100 each by Does not apply route 2 (two) times daily. 05/14/17   Dorena Dew, FNP  ipratropium (ATROVENT) 0.06 % nasal spray Place 2 sprays into both nostrils 4 (four) times daily. 09/20/18   Tasia Catchings, Tyrika Newman V, PA-C  lidocaine (XYLOCAINE) 2 % solution 5-15 mL gurgle as needed 09/20/18   Tasia Catchings, Nayellie Sanseverino V, PA-C  Magnesium Citrate 100 MG TABS Take 100 mg by mouth every evening.    [provider]  meloxicam (MOBIC) 15 MG tablet Take 1 tablet (15 mg total) by mouth daily. 08/18/18   Lanae Boast, FNP  metFORMIN (GLUCOPHAGE) 500 MG tablet Take 1 tablet (500 mg total) by mouth 2 (two) times daily with a meal. 11/22/17 06/19/18  Lanae Boast, FNP  montelukast (SINGULAIR) 10 MG tablet Take 1 tablet (10 mg total) by mouth daily. 11/22/17   Lanae Boast, FNP  Multiple Vitamins-Minerals (MULTIVITAMIN ADULT PO) Take 1 tablet by mouth daily.    [provider]  naproxen (NAPROSYN) 500 MG tablet Take 1 tablet (500 mg total) by mouth 2 (two) times daily as needed for headache. 06/19/18   Lanae Boast, FNP  norethindrone (ORTHO MICRONOR) 0.35 MG tablet Take 1 tablet (0.35 mg total) by mouth daily. 08/21/18   Lanae Boast, FNP  ondansetron (ZOFRAN ODT) 4 MG disintegrating tablet Take 1 tablet (4 mg total) by mouth every 4 (four) hours as needed for nausea or vomiting. 03/15/18   Charlesetta Shanks, MD  promethazine (PHENERGAN) 12.5  MG tablet Take 12.5 mg by mouth every 6 (six) hours as needed for nausea or vomiting.    [provider]  Psyllium (FIBER) 0.52 g CAPS Take 1 capsule by mouth daily.    [provider]  rizatriptan (MAXALT-MLT) 10 MG disintegrating tablet Take 1 tablet (10 mg total) by mouth as needed for migraine. May repeat in 2 hours if needed.  Max 30 mg/24 h 09/17/17   Dorena Dew, FNP  VENTOLIN HFA 108 (90 Base) MCG/ACT inhaler INHALE 2 PUFFS INTO THE LUNGS EVERY 4 HOURS AS NEEDED FOR WHEEZING ORSHORTNESS OF BREATH. 06/25/18   Lanae Boast, FNP    Family History Family History  Problem Relation Age of Onset  . Diabetes Father   . Hyperlipidemia Father   . Hypertension Father   . Heart disease Father   . Kidney disease Father        Developed after 2nd CABG  . Diabetes Paternal Grandfather   . Heart disease Paternal Grandfather   . Hyperlipidemia Paternal Grandfather   . Hypertension Paternal Grandfather   . Kidney disease Paternal Grandfather     Social History Social History   Tobacco Use  . Smoking status: Never Smoker  . Smokeless tobacco: Never Used  Substance Use Topics  . Alcohol use: No  . Drug use: No     Allergies   Corn-containing products; Honey; Mango flavor; and Victoza [liraglutide]   Review of Systems Review of Systems  Reason unable to perform ROS: See HPI as above.     Physical Exam Triage Vital Signs ED Triage Vitals  Enc Vitals Group     BP 09/20/18 1657 121/78     Pulse Rate 09/20/18 1657 (!) 119     Resp 09/20/18 1657 17     Temp 09/20/18 1657 99.6 F (37.6 C)     Temp Source 09/20/18 1657 Oral     SpO2 09/20/18 1657 97 %     Weight --      Height --      Head Circumference --      Peak Flow --      Pain Score 09/20/18 1700 10     Pain Loc --      Pain Edu? --      Excl. in [place]? --    No data found.  Updated Vital Signs BP 121/78 (BP Location: Left Arm)   Pulse (!) 119   Temp 99.6 F (37.6 C) (Oral) Comment: took  600 mg of advil at 10 am  Resp 17   LMP 09/08/2018   SpO2 97%   Physical Exam Constitutional:      General: She is not in acute distress.    Appearance: Normal appearance. She is not ill-appearing, toxic-appearing or diaphoretic.  HENT:     Head: Normocephalic and atraumatic.     Right Ear: Tympanic membrane, ear canal and external ear normal. Tympanic membrane is not erythematous or bulging.     Left Ear: Tympanic membrane, ear canal and external ear normal. Tympanic membrane is not erythematous or bulging.     Nose: Septal deviation present.     Mouth/Throat:     Mouth: Mucous membranes are moist.     Pharynx: Oropharynx is clear. Uvula midline.     Comments: Right tonsillar swelling with exudates. No obvious left tonsillar exudate. Speaking in full sentences, handling own secretions well.  Neck:     Musculoskeletal: Normal range of motion and neck supple.  Cardiovascular:     Rate and Rhythm: Normal rate and regular rhythm.     Heart sounds: Normal heart sounds. No murmur. No friction rub. No gallop.   Pulmonary:     Effort: Pulmonary effort is normal. No accessory muscle usage, prolonged expiration, respiratory distress or retractions.     Comments: Lungs clear to auscultation without adventitious lung sounds. Neurological:     General: No focal deficit present.     Mental Status: She is alert  and oriented to person, place, and time.    UC Treatments / Results  Labs (all labs ordered are listed, but only abnormal results are displayed) Labs Reviewed - No data to display  EKG None  Radiology No results found.  Procedures Procedures (including critical care time)  Medications Ordered in UC Medications  dexamethasone (DECADRON) injection 10 mg (10 mg Intramuscular Given 09/20/18 1728)    Initial Impression / Assessment and Plan / UC Course  I have reviewed the triage vital signs and the nursing notes.  Pertinent labs & imaging results that were available during my  care of the patient were reviewed by me and considered in my medical decision making (see chart for details).    Will provide decadron injection in office today. Augmentin to cover possible sinusitis/tonsillitis. Discussed possible post nasal drip causing symptoms as well, will start atrovent. Other symptomatic treatment discussed. Return precautions given. Otherwise, patient to follow up with ENT as per PCP referral.  Final Clinical Impressions(s) / UC Diagnoses   Final diagnoses:  Acute tonsillitis, unspecified etiology  Acute non-recurrent pansinusitis    ED Prescriptions    Medication Sig Dispense Auth. Provider   amoxicillin-clavulanate (AUGMENTIN) 875-125 MG tablet Take 1 tablet by mouth every 12 (twelve) hours. 14 tablet Johnnie Moten V, PA-C   ipratropium (ATROVENT) 0.06 % nasal spray Place 2 sprays into both nostrils 4 (four) times daily. 15 mL Vela Render V, PA-C   lidocaine (XYLOCAINE) 2 % solution 5-15 mL gurgle as needed 150 mL Tobin Chad, [place] 09/20/18 1739

## 2018-09-22 ENCOUNTER — Encounter: Payer: Self-pay | Admitting: Family Medicine

## 2018-09-24 ENCOUNTER — Other Ambulatory Visit: Payer: Self-pay | Admitting: Family Medicine

## 2018-09-24 MED ORDER — RIZATRIPTAN BENZOATE 10 MG PO TBDP: 10.0000 mg | ORAL_TABLET | ORAL | 6 refills | Status: DC | PRN

## 2018-09-24 NOTE — Progress Notes (Signed)
refilled 

## 2018-10-06 ENCOUNTER — Other Ambulatory Visit: Payer: Self-pay | Admitting: Family Medicine

## 2018-10-06 DIAGNOSIS — K219 Gastro-esophageal reflux disease without esophagitis: Secondary | ICD-10-CM

## 2018-10-27 ENCOUNTER — Ambulatory Visit (INDEPENDENT_AMBULATORY_CARE_PROVIDER_SITE_OTHER): Payer: Medicaid Other | Admitting: Otolaryngology

## 2018-10-27 DIAGNOSIS — J3503 Chronic tonsillitis and adenoiditis: Secondary | ICD-10-CM

## 2018-10-27 DIAGNOSIS — J353 Hypertrophy of tonsils with hypertrophy of adenoids: Secondary | ICD-10-CM

## 2018-11-12 ENCOUNTER — Other Ambulatory Visit: Payer: Self-pay | Admitting: Family Medicine

## 2018-11-12 DIAGNOSIS — J4530 Mild persistent asthma, uncomplicated: Secondary | ICD-10-CM

## 2018-11-25 ENCOUNTER — Other Ambulatory Visit: Payer: Self-pay | Admitting: Otolaryngology

## 2018-12-10 ENCOUNTER — Other Ambulatory Visit: Payer: Self-pay

## 2018-12-10 ENCOUNTER — Ambulatory Visit (INDEPENDENT_AMBULATORY_CARE_PROVIDER_SITE_OTHER): Payer: Medicaid Other | Admitting: Obstetrics and Gynecology

## 2018-12-10 VITALS — BP 116/69 | HR 90 | Temp 98.0°F | Wt 258.5 lb

## 2018-12-10 DIAGNOSIS — Z3041 Encounter for surveillance of contraceptive pills: Secondary | ICD-10-CM

## 2018-12-10 DIAGNOSIS — Z01419 Encounter for gynecological examination (general) (routine) without abnormal findings: Secondary | ICD-10-CM

## 2018-12-10 DIAGNOSIS — Z8742 Personal history of other diseases of the female genital tract: Secondary | ICD-10-CM

## 2018-12-10 DIAGNOSIS — Z0000 Encounter for general adult medical examination without abnormal findings: Secondary | ICD-10-CM | POA: Diagnosis not present

## 2018-12-10 NOTE — Progress Notes (Signed)
Obstetrics and Gynecology Annual Patient Evaluation  Appointment Date: 12/10/2018  OBGYN Clinic: [facility]  Primary Care Provider: Lanae Boast  Referring Provider: Link Snuffer  Chief Complaint: contraception management, annual   History of Present Illness: Lori Shea is a 43 y.o. [phone] (Patient's last menstrual period was 11/26/2018.), seen for the above chief complaint. Her past medical history is significant for BMI 40s, HTN, DM2, h/o LSIL pap  [facility] to [facility] and she is interested in [place] and coming off POPs due to concern for pregnancy, so they recommended she see [place] for counseling.  No breast s/s, vaginal discharge, abdominal pain, dysuria, vaginal itching, dyspareunia  Review of Systems: as noted in the History of Present Illness.  Past Medical History:  Past Medical History:  Diagnosis Date  . Allergy    Foods:  mango, honey, corn.  Allergic to "everything tested"  15 years ago in [place]  . Asthma age 37 yo   Triggers:  cold weather, exercise, allergens, anxiety, URIs  . Cholelithiasis age 84 yo   Asymptomatic  . Diabetes mellitus 2012   Was diagnosed 2 years earlier with PCOS  . Elevated liver enzymes 09/13/2016  . Hypertension 2015  . Kidney stones   . Migraines 2009  . Morbid obesity ([place])   . PCOS (polycystic ovarian syndrome) 2010    Past Surgical History:  Past Surgical History:  Procedure Laterality Date  . COLPOSCOPY N/A     Past Obstetrical History:  OB History  Gravida Para Term Preterm AB Living  4 1 1   3 1   SAB TAB Ectopic Multiple Live Births  3            # Outcome Date GA Lbr Len/2nd Weight Sex Delivery Anes PTL Lv  4 SAB           3 Term           2 SAB           1 SAB             Past Gynecological History: As per HPI. qmonth periods, regular on POPs. Patient previously on OCPs for close to 20 years but taken off due to co-morbidities History of Pap Smear(s): Yes.   Last pap 2019 LGSIL. Colpo adequate  with just hpv effect seen on bx and ECC negative.   Social History:  Social History   Socioeconomic History  . Marital status: Divorced    Spouse name: Not on file  . Number of children: 1  . Years of education: some comm. college  . Highest education level: Not on file  Occupational History  . Occupation: CNA    Comment: Veterinary surgeon  . Financial resource strain: Not on file  . Food insecurity    Worry: Not on file    Inability: Not on file  . Transportation needs    Medical: Not on file    Non-medical: Not on file  Tobacco Use  . Smoking status: Never Smoker  . Smokeless tobacco: Never Used  Substance and Sexual Activity  . Alcohol use: No  . Drug use: No  . Sexual activity: Yes    Birth control/protection: None    Comment: Sprintec  Lifestyle  . Physical activity    Days per week: Not on file    Minutes per session: Not on file  . Stress: Not on file  Relationships  . Social Herbalist on phone:  Not on file    Gets together: Not on file    Attends religious service: Not on file    Active member of club or organization: Not on file    Attends meetings of clubs or organizations: Not on file    Relationship status: Not on file  . Intimate partner violence    Fear of current or ex partner: Not on file    Emotionally abused: Not on file    Physically abused: Not on file    Forced sexual activity: Not on file  Other Topics Concern  . Not on file  Social History Narrative   Originally from [place], [place] to [place] in 1988.   In [place] in [place] at home with daughter on [place].    Family History:  Family History  Problem Relation Age of Onset  . Diabetes Father   . Hyperlipidemia Father   . Hypertension Father   . Heart disease Father   . Kidney disease Father        Developed after 2nd CABG  . Diabetes Paternal Grandfather   . Heart disease Paternal Grandfather   . Hyperlipidemia  Paternal Grandfather   . Hypertension Paternal Grandfather   . Kidney disease Paternal Grandfather    She denies any female cancers  Health Maintenance:  Mammogram(s): 2019, negative  Medications Minervia Osso. Eugenio Hoes "Juliann Pulse" had no medications administered during this visit. Current Outpatient Medications  Medication Sig Dispense Refill  . ADVAIR DISKUS 100-50 MCG/DOSE AEPB INHALE 1 PUFF INTO THE LUNGS EVERY 12 HOURS. 3 each 0  . albuterol (PROVENTIL) (2.5 MG/3ML) 0.083% nebulizer solution Take 3 mLs (2.5 mg total) by nebulization every 6 (six) hours as needed for wheezing or shortness of breath. 75 mL 0  . amitriptyline (ELAVIL) 25 MG tablet Take 1 tablet (25 mg total) by mouth at bedtime. 30 tablet 5  . Black Elderberry,Berry-Flower, 575 MG CAPS Take 1 capsule by mouth daily.    . cetirizine (ZYRTEC) 10 MG tablet Take 1 tablet (10 mg total) by mouth daily. 30 tablet 11  . cyclobenzaprine (FLEXERIL) 10 MG tablet Take 1 tablet (10 mg total) by mouth 3 (three) times daily as needed for muscle spasms. 30 tablet 0  . DEXILANT 30 MG capsule TAKE 1 CAPSULE (30MG  TOTAL) BY MOUTH DAILY. TAKE ON AN EMPTY STOMACH. 90 capsule 0  . EASY COMFORT PEN NEEDLES 32G X 4 MM MISC use as directed test 2 times daily  3  . EDARBYCLOR 40-25 MG TABS TAKE 1 TABLET BY MOUTH DAILY. 90 tablet 0  . fluticasone (FLONASE) 50 MCG/ACT nasal spray Place 2 sprays into both nostrils daily. 16 g 0  . ibuprofen (ADVIL,MOTRIN) 600 MG tablet Take 1 tablet (600 mg total) by mouth every 6 (six) hours as needed. 30 tablet 0  . insulin aspart (NOVOLOG FLEXPEN) 100 UNIT/ML FlexPen Inject 10-14 Units into the skin 3 (three) times daily before meals. 30 mL 5  . Insulin Glargine (LANTUS SOLOSTAR) 100 UNIT/ML Solostar Pen Inject 60 units at bedtime. 5 pen 11  . Insulin Pen Needle (PEN NEEDLES) 32G X 5 MM MISC 100 each by Does not apply route 2 (two) times daily. 100 each 3  . ipratropium (ATROVENT) 0.06 % nasal spray Place 2 sprays into  both nostrils 4 (four) times daily. 15 mL 0  . lidocaine (XYLOCAINE) 2 % solution 5-15 mL gurgle as needed 150 mL 0  . Magnesium Citrate 100 MG TABS Take  100 mg by mouth every evening.    . meloxicam (MOBIC) 15 MG tablet Take 1 tablet (15 mg total) by mouth daily. 30 tablet 3  . metFORMIN (GLUCOPHAGE) 500 MG tablet Take 1 tablet (500 mg total) by mouth 2 (two) times daily with a meal. 60 tablet 5  . montelukast (SINGULAIR) 10 MG tablet Take 1 tablet (10 mg total) by mouth daily. 30 tablet 5  . Multiple Vitamins-Minerals (MULTIVITAMIN ADULT PO) Take 1 tablet by mouth daily.    . naproxen (NAPROSYN) 500 MG tablet Take 1 tablet (500 mg total) by mouth 2 (two) times daily as needed for headache. 30 tablet 0  . norethindrone (ORTHO MICRONOR) 0.35 MG tablet Take 1 tablet (0.35 mg total) by mouth daily. 3 Package 3  . ondansetron (ZOFRAN ODT) 4 MG disintegrating tablet Take 1 tablet (4 mg total) by mouth every 4 (four) hours as needed for nausea or vomiting. 20 tablet 0  . promethazine (PHENERGAN) 12.5 MG tablet Take 12.5 mg by mouth every 6 (six) hours as needed for nausea or vomiting.    . Psyllium (FIBER) 0.52 g CAPS Take 1 capsule by mouth daily.    . rizatriptan (MAXALT-MLT) 10 MG disintegrating tablet Take 1 tablet (10 mg total) by mouth as needed for migraine. May repeat in 2 hours if needed.  Max 30 mg/24 h 12 tablet 6  . VENTOLIN HFA 108 (90 Base) MCG/ACT inhaler INHALE 2 PUFFS INTO THE LUNGS EVERY 4 HOURS AS NEEDED FOR WHEEZING ORSHORTNESS OF BREATH. 3 each 0  . amoxicillin-clavulanate (AUGMENTIN) 875-125 MG tablet Take 1 tablet by mouth every 12 (twelve) hours. 14 tablet 0  . famotidine (PEPCID) 40 MG tablet Take 1 tablet (40 mg total) by mouth 2 (two) times daily for 15 days. 30 tablet 0  . fluconazole (DIFLUCAN) 150 MG tablet Take 1 tablet (150 mg total) by mouth every 3 (three) days. Take one tablet now and repeat in 3 days 2 tablet 0   No current facility-administered medications for this  visit.     Allergies Corn-containing products, Honey, Mango flavor, and Victoza [liraglutide]   Physical Exam:  BP 116/69   Pulse 90   Temp 98 F (36.7 C)   Wt 258 lb 8 oz (117.3 kg)   LMP 11/26/2018   BMI 43.02 kg/m  Body mass index is 43.02 kg/m. General appearance: Well nourished, well developed female in no acute distress.  Neck:  Supple, normal appearance, and no thyromegaly  Cardiovascular: normal s1 and s2.  No murmurs, rubs or gallops. Respiratory:  Clear to auscultation bilateral. Normal respiratory effort Abdomen: positive bowel sounds and no masses, hernias; diffusely non tender to palpation, non distended Breasts: breasts appear normal, no suspicious masses, no skin or nipple changes or axillary nodes, and normal palpation. Neuro/Psych:  Normal mood and affect.  Skin:  Warm and dry.   Pelvic exam: patient declines  Laboratory: none  Radiology: none  Assessment: pt stable  Plan:  1. History of abnormal cervical Pap smear Pt due for repeat pap today but patient declines; d/w her importance of this and that [facility] medicaid will cover. Pt still declines. Will see if can do during BTL  2. Encounter for well woman exam with routine gynecological exam Routine care. Pt to schedule mammogram. D/w her re: various recommendations for low risk screening  3. Contraception management D/w her re: r/b/a and she would like BTL. I told her that unknown what her periods are likely and given her h/o  PCOS that she may have irregular periods and need medications for that too and pt is okay with that possibility. Will sign btl paperwork today. 7 minutes  RTC post op  Durene Romans MD Attending [facility])

## 2018-12-16 ENCOUNTER — Other Ambulatory Visit: Payer: Self-pay | Admitting: Family Medicine

## 2018-12-16 ENCOUNTER — Other Ambulatory Visit: Payer: Self-pay

## 2018-12-16 ENCOUNTER — Encounter (HOSPITAL_BASED_OUTPATIENT_CLINIC_OR_DEPARTMENT_OTHER): Payer: Self-pay | Admitting: *Deleted

## 2018-12-16 DIAGNOSIS — I10 Essential (primary) hypertension: Secondary | ICD-10-CM

## 2018-12-18 ENCOUNTER — Encounter: Payer: Self-pay | Admitting: Family Medicine

## 2018-12-18 ENCOUNTER — Ambulatory Visit (INDEPENDENT_AMBULATORY_CARE_PROVIDER_SITE_OTHER): Payer: Self-pay | Admitting: Family Medicine

## 2018-12-18 ENCOUNTER — Other Ambulatory Visit: Payer: Self-pay

## 2018-12-18 VITALS — BP 113/70 | HR 98 | Temp 98.8°F | Resp 16 | Ht 65.0 in | Wt 258.0 lb

## 2018-12-18 DIAGNOSIS — E1165 Type 2 diabetes mellitus with hyperglycemia: Secondary | ICD-10-CM

## 2018-12-18 DIAGNOSIS — M545 Low back pain, unspecified: Secondary | ICD-10-CM

## 2018-12-18 DIAGNOSIS — Z23 Encounter for immunization: Secondary | ICD-10-CM

## 2018-12-18 DIAGNOSIS — R8290 Unspecified abnormal findings in urine: Secondary | ICD-10-CM

## 2018-12-18 DIAGNOSIS — Z794 Long term (current) use of insulin: Secondary | ICD-10-CM

## 2018-12-18 DIAGNOSIS — I10 Essential (primary) hypertension: Secondary | ICD-10-CM

## 2018-12-18 LAB — POCT URINALYSIS DIPSTICK
Bilirubin, UA: NEGATIVE
Blood, UA: NEGATIVE
Glucose, UA: POSITIVE — AB
Ketones, UA: NEGATIVE
Nitrite, UA: POSITIVE
Protein, UA: NEGATIVE
Spec Grav, UA: 1.015 (ref 1.010–1.025)
Urobilinogen, UA: 0.2 E.U./dL
pH, UA: 6 (ref 5.0–8.0)

## 2018-12-18 LAB — POCT GLYCOSYLATED HEMOGLOBIN (HGB A1C): Hemoglobin A1C: 10.7 % — AB (ref 4.0–5.6)

## 2018-12-18 LAB — GLUCOSE, POCT (MANUAL RESULT ENTRY): POC Glucose: 362 mg/dl — AB (ref 70–99)

## 2018-12-18 MED ORDER — MELOXICAM 15 MG PO TABS: 15.0000 mg | ORAL_TABLET | Freq: Every day | ORAL | 3 refills | Status: DC

## 2018-12-18 NOTE — Progress Notes (Signed)
Patient [facility]   Progress Note: General Provider: Lanae Boast, FNP  SUBJECTIVE:   Lori Shea is a 43 y.o. female who  has a past medical history of Allergy, Asthma (age 37 yo), Cholelithiasis (age 50 yo), Diabetes mellitus (2012), Elevated liver enzymes (09/13/2016), Hypertension (2015), Kidney stones, Migraines (2009), Morbid obesity ([place]), and PCOS (polycystic ovarian syndrome) (2010).. Patient presents today for Diabetes and Poisoning (wants to be tested for poisoning?) Patient states that she has not followed up with endocrinology due to COVID 19 restrictions. Patient denies checking blood glucose and has been using a sliding insulin scale with out doing so. Patient states that she would like to be tested for arsenic due to believing her husband has been poisoning her food. She reports having several episodes of violent vomiting that caused ED visits. She states that prior to these episodes, her husband would bring food home for her. She was the only one that consumed the food and became ill. She would like to have a test using her nails and hair. She denies abdominal pain, NVD today. She is scheduled to have a tonsillectomy and tubal ligation in the up coming months.     Review of Systems  Constitutional: Negative.   HENT: Negative.   Eyes: Negative.   Respiratory: Negative.   Cardiovascular: Negative.   Gastrointestinal: Negative.   Genitourinary: Negative.   Musculoskeletal: Negative.   Skin: Negative.   Neurological: Negative.   Psychiatric/Behavioral: Negative.      OBJECTIVE: BP 113/70 (BP Location: Left Arm, Patient Position: Sitting, Cuff Size: Large)   Pulse 98   Temp 98.8 F (37.1 C) (Oral)   Resp 16   Ht 5\' 5"  (1.651 m)   Wt 258 lb (117 kg)   LMP 11/26/2018   SpO2 98%   BMI 42.93 kg/m   Wt Readings from Last 3 Encounters:  12/18/18 258 lb (117 kg)  12/10/18 258 lb 8 oz (117.3 kg)  09/12/18 259 lb (117.5  kg)     Physical Exam Vitals signs and nursing note reviewed.  Constitutional:      General: She is not in acute distress.    Appearance: Normal appearance.  HENT:     Head: Normocephalic and atraumatic.  Eyes:     Extraocular Movements: Extraocular movements intact.     Conjunctiva/sclera: Conjunctivae normal.     Pupils: Pupils are equal, round, and reactive to light.  Cardiovascular:     Rate and Rhythm: Normal rate and regular rhythm.     Heart sounds: No murmur.  Pulmonary:     Effort: Pulmonary effort is normal.     Breath sounds: Normal breath sounds.  Musculoskeletal: Normal range of motion.  Skin:    General: Skin is warm and dry.  Neurological:     Mental Status: She is alert and oriented to person, place, and time.  Psychiatric:        Mood and Affect: Mood normal.        Behavior: Behavior normal.        Thought Content: Thought content normal.        Judgment: Judgment normal.     ASSESSMENT/PLAN: 1. Essential hypertension - Urinalysis Dipstick  2. Type 2 diabetes mellitus with hyperglycemia, with long-term current use of insulin (HCC) - HgB A1c - Urinalysis Dipstick - Glucose (CBG)  3. Acute right-sided low back pain, unspecified whether sciatica present - meloxicam (MOBIC) 15 MG tablet; Take 1 tablet (15 mg  total) by mouth daily.  Dispense: 30 tablet; Refill: 3  4. Abnormal urinalysis - Urine Culture   Discussed that this lab does not due arsenic testing through hair and nails. Advised that she may be able to find an integrative medicine clinic that would do this specific form of testing.   Return in about 3 months (around 03/20/2019) for htn.    The patient was given clear instructions to go to ER or return to medical center if symptoms do not improve, worsen or new problems develop. The patient verbalized understanding and agreed with plan of care.   Ms. Doug Sou. Nathaneil Canary, FNP-BC Patient [facility] [address]  [place],  [id number] [phone]

## 2018-12-18 NOTE — Patient Instructions (Signed)
Diabetic Sliding Scale (I) You need to check your blood sugar prior to administering insulin.  IF BLOOD SUGAR IS:   LESS THAN 100 ( NO INSULIN)   100-140 (2 UNITS)   141-180 (4 UNITS)   181-220 (6 UNITS)   221-260 (8 UNITS)   261-300 (10 UNITS)   301-340 (12 UNITS)   MORE THAN 341 UNITS (14 UNITS)  Diabetes Mellitus and Nutrition, Adult When you have diabetes (diabetes mellitus), it is very important to have healthy eating habits because your blood sugar (glucose) levels are greatly affected by what you eat and drink. Eating healthy foods in the appropriate amounts, at about the same times every day, can help you: Control your blood glucose. Lower your risk of heart disease. Improve your blood pressure. Reach or maintain a healthy weight. Every person with diabetes is different, and each person has different needs for a meal plan. Your health care provider may recommend that you work with a diet and nutrition specialist (dietitian) to make a meal plan that is best for you. Your meal plan may vary depending on factors such as: The calories you need. The medicines you take. Your weight. Your blood glucose, blood pressure, and cholesterol levels. Your activity level. Other health conditions you have, such as heart or kidney disease. How do carbohydrates affect me? Carbohydrates, also called carbs, affect your blood glucose level more than any other type of food. Eating carbs naturally raises the amount of glucose in your blood. Carb counting is a method for keeping track of how many carbs you eat. Counting carbs is important to keep your blood glucose at a healthy level, especially if you use insulin or take certain oral diabetes medicines. It is important to know how many carbs you can safely have in each meal. This is different for every person. Your dietitian can help you calculate how many carbs you should have at each meal and for each snack. Foods that contain carbs  include: Bread, cereal, rice, pasta, and crackers. Potatoes and corn. Peas, beans, and lentils. Milk and yogurt. Fruit and juice. Desserts, such as cakes, cookies, ice cream, and candy. How does alcohol affect me? Alcohol can cause a sudden decrease in blood glucose (hypoglycemia), especially if you use insulin or take certain oral diabetes medicines. Hypoglycemia can be a life-threatening condition. Symptoms of hypoglycemia (sleepiness, dizziness, and confusion) are similar to symptoms of having too much alcohol. If your health care provider says that alcohol is safe for you, follow these guidelines: Limit alcohol intake to no more than 1 drink per day for nonpregnant women and 2 drinks per day for men. One drink equals 12 oz of beer, 5 oz of wine, or 1 oz of hard liquor. Do not drink on an empty stomach. Keep yourself hydrated with water, diet soda, or unsweetened iced tea. Keep in mind that regular soda, juice, and other mixers may contain a lot of sugar and must be counted as carbs. What are tips for following this plan?  Reading food labels Start by checking the serving size on the "Nutrition Facts" label of packaged foods and drinks. The amount of calories, carbs, fats, and other nutrients listed on the label is based on one serving of the item. Many items contain more than one serving per package. Check the total grams (g) of carbs in one serving. You can calculate the number of servings of carbs in one serving by dividing the total carbs by 15. For example, if a food has  30 g of total carbs, it would be equal to 2 servings of carbs. Check the number of grams (g) of saturated and trans fats in one serving. Choose foods that have low or no amount of these fats. Check the number of milligrams (mg) of salt (sodium) in one serving. Most people should limit total sodium intake to less than 2,300 mg per day. Always check the nutrition information of foods labeled as "low-fat" or "nonfat".  These foods may be higher in added sugar or refined carbs and should be avoided. Talk to your dietitian to identify your daily goals for nutrients listed on the label. Shopping Avoid buying canned, premade, or processed foods. These foods tend to be high in fat, sodium, and added sugar. Shop around the outside edge of the grocery store. This includes fresh fruits and vegetables, bulk grains, fresh meats, and fresh dairy. Cooking Use low-heat cooking methods, such as baking, instead of high-heat cooking methods like deep frying. Cook using healthy oils, such as olive, canola, or sunflower oil. Avoid cooking with butter, cream, or high-fat meats. Meal planning Eat meals and snacks regularly, preferably at the same times every day. Avoid going long periods of time without eating. Eat foods high in fiber, such as fresh fruits, vegetables, beans, and whole grains. Talk to your dietitian about how many servings of carbs you can eat at each meal. Eat 4-6 ounces (oz) of lean protein each day, such as lean meat, chicken, fish, eggs, or tofu. One oz of lean protein is equal to: 1 oz of meat, chicken, or fish. 1 egg.  cup of tofu. Eat some foods each day that contain healthy fats, such as avocado, nuts, seeds, and fish. Lifestyle Check your blood glucose regularly. Exercise regularly as told by your health care provider. This may include: 150 minutes of moderate-intensity or vigorous-intensity exercise each week. This could be brisk walking, biking, or water aerobics. Stretching and doing strength exercises, such as yoga or weightlifting, at least 2 times a week. Take medicines as told by your health care provider. Do not use any products that contain nicotine or tobacco, such as cigarettes and e-cigarettes. If you need help quitting, ask your health care provider. Work with a Social worker or diabetes educator to identify strategies to manage stress and any emotional and social challenges. Questions to  ask a health care provider Do I need to meet with a diabetes educator? Do I need to meet with a dietitian? What number can I call if I have questions? When are the best times to check my blood glucose? Where to find more information: American Diabetes Association: diabetes.org Academy of Nutrition and Dietetics: www.eatright.[employer] of Diabetes and Digestive and Kidney Diseases (NIH): [employer] Summary A healthy meal plan will help you control your blood glucose and maintain a healthy lifestyle. Working with a diet and nutrition specialist (dietitian) can help you make a meal plan that is best for you. Keep in mind that carbohydrates (carbs) and alcohol have immediate effects on your blood glucose levels. It is important to count carbs and to use alcohol carefully. This information is not intended to replace advice given to you by your health care provider. Make sure you discuss any questions you have with your health care provider. Document Released: 01/04/2005 Document Revised: 03/22/2017 Document Reviewed: 05/14/2016 Elsevier Patient Education  2020 [employer].

## 2018-12-19 ENCOUNTER — Other Ambulatory Visit (HOSPITAL_COMMUNITY)
Admission: RE | Admit: 2018-12-19 | Discharge: 2018-12-19 | Disposition: A | Discharge status: Home / Self Care | Payer: HRSA Program | Source: Ambulatory Visit | Attending: Otolaryngology | Admitting: Otolaryngology

## 2018-12-19 DIAGNOSIS — Z20828 Contact with and (suspected) exposure to other viral communicable diseases: Secondary | ICD-10-CM | POA: Insufficient documentation

## 2018-12-19 DIAGNOSIS — Z01812 Encounter for preprocedural laboratory examination: Secondary | ICD-10-CM | POA: Diagnosis present

## 2018-12-19 LAB — SARS CORONAVIRUS 2 (TAT 6-24 HRS): SARS Coronavirus 2: NEGATIVE

## 2018-12-22 ENCOUNTER — Encounter: Payer: Self-pay | Admitting: Family Medicine

## 2018-12-22 ENCOUNTER — Encounter (HOSPITAL_BASED_OUTPATIENT_CLINIC_OR_DEPARTMENT_OTHER)
Admission: RE | Admit: 2018-12-22 | Discharge: 2018-12-22 | Disposition: A | Discharge status: Home / Self Care | Payer: Medicaid Other | Source: Ambulatory Visit | Attending: Otolaryngology | Admitting: Otolaryngology

## 2018-12-22 ENCOUNTER — Other Ambulatory Visit: Payer: Self-pay

## 2018-12-22 ENCOUNTER — Telehealth: Payer: Self-pay

## 2018-12-22 DIAGNOSIS — J029 Acute pharyngitis, unspecified: Secondary | ICD-10-CM | POA: Diagnosis not present

## 2018-12-22 DIAGNOSIS — E119 Type 2 diabetes mellitus without complications: Secondary | ICD-10-CM | POA: Diagnosis not present

## 2018-12-22 DIAGNOSIS — J353 Hypertrophy of tonsils with hypertrophy of adenoids: Secondary | ICD-10-CM | POA: Diagnosis not present

## 2018-12-22 DIAGNOSIS — Z01818 Encounter for other preprocedural examination: Secondary | ICD-10-CM | POA: Insufficient documentation

## 2018-12-22 DIAGNOSIS — K219 Gastro-esophageal reflux disease without esophagitis: Secondary | ICD-10-CM | POA: Diagnosis not present

## 2018-12-22 DIAGNOSIS — Z794 Long term (current) use of insulin: Secondary | ICD-10-CM | POA: Diagnosis not present

## 2018-12-22 DIAGNOSIS — I10 Essential (primary) hypertension: Secondary | ICD-10-CM | POA: Diagnosis not present

## 2018-12-22 DIAGNOSIS — J3501 Chronic tonsillitis: Secondary | ICD-10-CM | POA: Diagnosis not present

## 2018-12-22 DIAGNOSIS — Z6841 Body Mass Index (BMI) 40.0 and over, adult: Secondary | ICD-10-CM | POA: Diagnosis not present

## 2018-12-22 LAB — URINE CULTURE

## 2018-12-22 LAB — BASIC METABOLIC PANEL
Anion gap: 12 (ref 5–15)
BUN: 13 mg/dL (ref 6–20)
CO2: 23 mmol/L (ref 22–32)
Calcium: 8.8 mg/dL — ABNORMAL LOW (ref 8.9–10.3)
Chloride: 100 mmol/L (ref 98–111)
Creatinine, Ser: 0.71 mg/dL (ref 0.44–1.00)
GFR calc Af Amer: >60 mL/min (ref >60)
GFR calc non Af Amer: >60 mL/min (ref >60)
Glucose, Bld: 282 mg/dL — ABNORMAL HIGH (ref 70–99)
Potassium: 4.2 mmol/L (ref 3.5–5.1)
Sodium: 135 mmol/L (ref 135–145)

## 2018-12-22 LAB — POCT PREGNANCY, URINE: Preg Test, Ur: NEGATIVE

## 2018-12-22 MED ORDER — SULFAMETHOXAZOLE-TRIMETHOPRIM 800-160 MG PO TABS: 1.0000 | ORAL_TABLET | Freq: Two times a day (BID) | ORAL | 0 refills | Status: AC

## 2018-12-22 NOTE — Telephone Encounter (Signed)
-----   Message from Lanae Boast, [place] sent at 12/22/2018  2:13 PM EDT ----- Please let patient know that she has a urinary tract infection. I am sending in bactrim DS twice daily for the next 7 days.

## 2018-12-22 NOTE — Progress Notes (Signed)
Anesthesia consult per Dr. Woodrum, will proceed with surgery as scheduled.  

## 2018-12-22 NOTE — Telephone Encounter (Signed)
Called and spoke with patient advised that she has a uti and that we have sent in bactrim DS twice daily to take for the next 7 days. Patient verbalized understanding. Thanks!

## 2018-12-22 NOTE — Progress Notes (Signed)
Please let patient know that she has a urinary tract infection. I am sending in bactrim DS twice daily for the next 7 days.

## 2018-12-22 NOTE — Addendum Note (Signed)
Addended by: Genelle Bal on: 12/22/2018 02:12 PM   Modules accepted: Orders

## 2018-12-23 ENCOUNTER — Ambulatory Visit (HOSPITAL_BASED_OUTPATIENT_CLINIC_OR_DEPARTMENT_OTHER): Payer: Medicaid Other | Admitting: Anesthesiology

## 2018-12-23 ENCOUNTER — Encounter (HOSPITAL_BASED_OUTPATIENT_CLINIC_OR_DEPARTMENT_OTHER): Payer: Self-pay | Admitting: Anesthesiology

## 2018-12-23 ENCOUNTER — Ambulatory Visit (HOSPITAL_BASED_OUTPATIENT_CLINIC_OR_DEPARTMENT_OTHER)
Admission: RE | Admit: 2018-12-23 | Discharge: 2018-12-23 | Disposition: A | Discharge status: Home / Self Care | Payer: Medicaid Other | Attending: Otolaryngology | Admitting: Otolaryngology

## 2018-12-23 ENCOUNTER — Other Ambulatory Visit: Payer: Self-pay

## 2018-12-23 ENCOUNTER — Encounter (HOSPITAL_BASED_OUTPATIENT_CLINIC_OR_DEPARTMENT_OTHER)
Admission: RE | Disposition: A | Discharge status: Home / Self Care | Payer: Self-pay | Source: Home / Self Care | Attending: Otolaryngology

## 2018-12-23 DIAGNOSIS — E119 Type 2 diabetes mellitus without complications: Secondary | ICD-10-CM | POA: Diagnosis not present

## 2018-12-23 DIAGNOSIS — J353 Hypertrophy of tonsils with hypertrophy of adenoids: Secondary | ICD-10-CM | POA: Insufficient documentation

## 2018-12-23 DIAGNOSIS — J3501 Chronic tonsillitis: Secondary | ICD-10-CM | POA: Insufficient documentation

## 2018-12-23 DIAGNOSIS — J3503 Chronic tonsillitis and adenoiditis: Secondary | ICD-10-CM

## 2018-12-23 DIAGNOSIS — Z6841 Body Mass Index (BMI) 40.0 and over, adult: Secondary | ICD-10-CM | POA: Insufficient documentation

## 2018-12-23 DIAGNOSIS — I10 Essential (primary) hypertension: Secondary | ICD-10-CM | POA: Diagnosis not present

## 2018-12-23 DIAGNOSIS — J029 Acute pharyngitis, unspecified: Secondary | ICD-10-CM | POA: Diagnosis not present

## 2018-12-23 DIAGNOSIS — Z794 Long term (current) use of insulin: Secondary | ICD-10-CM | POA: Insufficient documentation

## 2018-12-23 DIAGNOSIS — K219 Gastro-esophageal reflux disease without esophagitis: Secondary | ICD-10-CM | POA: Diagnosis not present

## 2018-12-23 HISTORY — PX: TONSILLECTOMY AND ADENOIDECTOMY: SHX28

## 2018-12-23 LAB — GLUCOSE, CAPILLARY
Glucose-Capillary: 237 mg/dL — ABNORMAL HIGH (ref 70–99)
Glucose-Capillary: 231 mg/dL — ABNORMAL HIGH (ref 70–99)

## 2018-12-23 SURGERY — TONSILLECTOMY AND ADENOIDECTOMY
Anesthesia: General | Site: Throat | Laterality: Bilateral

## 2018-12-23 MED ORDER — FENTANYL CITRATE (PF) 100 MCG/2ML IJ SOLN
INTRAMUSCULAR | Status: AC
  Filled 2018-12-23: qty 2

## 2018-12-23 MED ORDER — PROMETHAZINE HCL 25 MG/ML IJ SOLN: 6.2500 mg | INTRAMUSCULAR | Status: DC | PRN

## 2018-12-23 MED ORDER — ROCURONIUM BROMIDE 10 MG/ML (PF) SYRINGE
PREFILLED_SYRINGE | INTRAVENOUS | Status: DC | PRN
  Administered 2018-12-23: 40 mg via INTRAVENOUS

## 2018-12-23 MED ORDER — SUGAMMADEX SODIUM 200 MG/2ML IV SOLN
INTRAVENOUS | Status: DC | PRN
  Administered 2018-12-23: 300 mg via INTRAVENOUS

## 2018-12-23 MED ORDER — MUPIROCIN 2 % EX OINT
TOPICAL_OINTMENT | CUTANEOUS | Status: AC
  Filled 2018-12-23: qty 22

## 2018-12-23 MED ORDER — LIDOCAINE 2% (20 MG/ML) 5 ML SYRINGE
INTRAMUSCULAR | Status: AC
  Filled 2018-12-23: qty 5

## 2018-12-23 MED ORDER — PROPOFOL 500 MG/50ML IV EMUL
INTRAVENOUS | Status: AC
  Filled 2018-12-23: qty 100

## 2018-12-23 MED ORDER — OXYCODONE-ACETAMINOPHEN 5-325 MG PO TABS: 1.0000 | ORAL_TABLET | ORAL | 0 refills | Status: AC | PRN

## 2018-12-23 MED ORDER — BUPIVACAINE HCL (PF) 0.25 % IJ SOLN
INTRAMUSCULAR | Status: AC
  Filled 2018-12-23: qty 30

## 2018-12-23 MED ORDER — ROCURONIUM BROMIDE 10 MG/ML (PF) SYRINGE
PREFILLED_SYRINGE | INTRAVENOUS | Status: AC
  Filled 2018-12-23: qty 10

## 2018-12-23 MED ORDER — DEXAMETHASONE SODIUM PHOSPHATE 10 MG/ML IJ SOLN
INTRAMUSCULAR | Status: AC
  Filled 2018-12-23: qty 1

## 2018-12-23 MED ORDER — OXYCODONE HCL 5 MG PO TABS
ORAL_TABLET | ORAL | Status: AC
  Filled 2018-12-23: qty 1

## 2018-12-23 MED ORDER — DIPHENHYDRAMINE HCL 50 MG/ML IJ SOLN
INTRAMUSCULAR | Status: DC | PRN
  Administered 2018-12-23: 12.5 mg via INTRAVENOUS

## 2018-12-23 MED ORDER — LIDOCAINE 2% (20 MG/ML) 5 ML SYRINGE
INTRAMUSCULAR | Status: DC | PRN
  Administered 2018-12-23: 100 mg via INTRAVENOUS

## 2018-12-23 MED ORDER — OXYMETAZOLINE HCL 0.05 % NA SOLN
NASAL | Status: DC | PRN
  Administered 2018-12-23: 1 via TOPICAL

## 2018-12-23 MED ORDER — MIDAZOLAM HCL 2 MG/2ML IJ SOLN
1.0000 mg | INTRAMUSCULAR | Status: DC | PRN
  Administered 2018-12-23: 2 mg via INTRAVENOUS

## 2018-12-23 MED ORDER — LACTATED RINGERS IV SOLN
INTRAVENOUS | Status: DC
  Administered 2018-12-23 (×2): via INTRAVENOUS

## 2018-12-23 MED ORDER — ESMOLOL HCL 100 MG/10ML IV SOLN
INTRAVENOUS | Status: AC
  Filled 2018-12-23: qty 10

## 2018-12-23 MED ORDER — OXYMETAZOLINE HCL 0.05 % NA SOLN
NASAL | Status: AC
  Filled 2018-12-23: qty 60

## 2018-12-23 MED ORDER — SUCCINYLCHOLINE CHLORIDE 200 MG/10ML IV SOSY
PREFILLED_SYRINGE | INTRAVENOUS | Status: AC
  Filled 2018-12-23: qty 10

## 2018-12-23 MED ORDER — BACITRACIN ZINC 500 UNIT/GM EX OINT
TOPICAL_OINTMENT | CUTANEOUS | Status: AC
  Filled 2018-12-23: qty 0.9

## 2018-12-23 MED ORDER — FENTANYL CITRATE (PF) 100 MCG/2ML IJ SOLN
25.0000 ug | INTRAMUSCULAR | Status: DC | PRN
  Administered 2018-12-23 (×2): 50 ug via INTRAVENOUS

## 2018-12-23 MED ORDER — SUCCINYLCHOLINE CHLORIDE 20 MG/ML IJ SOLN
INTRAMUSCULAR | Status: DC | PRN
  Administered 2018-12-23: 100 mg via INTRAVENOUS

## 2018-12-23 MED ORDER — ACETAMINOPHEN 500 MG PO TABS
ORAL_TABLET | ORAL | Status: AC
  Filled 2018-12-23: qty 1

## 2018-12-23 MED ORDER — ACETAMINOPHEN 500 MG PO TABS
ORAL_TABLET | ORAL | Status: AC
  Filled 2018-12-23: qty 2

## 2018-12-23 MED ORDER — ONDANSETRON HCL 4 MG/2ML IJ SOLN
INTRAMUSCULAR | Status: DC | PRN
  Administered 2018-12-23: 4 mg via INTRAVENOUS

## 2018-12-23 MED ORDER — ACETAMINOPHEN 500 MG PO TABS
1000.0000 mg | ORAL_TABLET | Freq: Once | ORAL | Status: AC
  Administered 2018-12-23: 07:00:00 1000 mg via ORAL

## 2018-12-23 MED ORDER — CEFAZOLIN SODIUM 1 G IJ SOLR
INTRAMUSCULAR | Status: AC
  Filled 2018-12-23: qty 20

## 2018-12-23 MED ORDER — SCOPOLAMINE 1 MG/3DAYS TD PT72
1.0000 | MEDICATED_PATCH | Freq: Once | TRANSDERMAL | Status: DC
  Administered 2018-12-23: 07:00:00 1.5 mg via TRANSDERMAL

## 2018-12-23 MED ORDER — PROPOFOL 10 MG/ML IV BOLUS
INTRAVENOUS | Status: DC | PRN
  Administered 2018-12-23: 200 mg via INTRAVENOUS

## 2018-12-23 MED ORDER — DEXAMETHASONE SODIUM PHOSPHATE 4 MG/ML IJ SOLN
INTRAMUSCULAR | Status: DC | PRN
  Administered 2018-12-23: 1 mg via INTRAVENOUS

## 2018-12-23 MED ORDER — OXYCODONE HCL 5 MG PO TABS
5.0000 mg | ORAL_TABLET | Freq: Once | ORAL | Status: AC
  Administered 2018-12-23: 09:00:00 5 mg via ORAL

## 2018-12-23 MED ORDER — SODIUM CHLORIDE 0.9 % IR SOLN
Status: DC | PRN
  Administered 2018-12-23: 250 mL

## 2018-12-23 MED ORDER — FENTANYL CITRATE (PF) 100 MCG/2ML IJ SOLN
50.0000 ug | INTRAMUSCULAR | Status: DC | PRN
  Administered 2018-12-23: 50 ug via INTRAVENOUS
  Administered 2018-12-23: 100 ug via INTRAVENOUS

## 2018-12-23 MED ORDER — LIDOCAINE-EPINEPHRINE 1 %-1:100000 IJ SOLN
INTRAMUSCULAR | Status: AC
  Filled 2018-12-23: qty 2

## 2018-12-23 MED ORDER — ONDANSETRON HCL 4 MG/2ML IJ SOLN
INTRAMUSCULAR | Status: AC
  Filled 2018-12-23: qty 2

## 2018-12-23 MED ORDER — MIDAZOLAM HCL 2 MG/2ML IJ SOLN
INTRAMUSCULAR | Status: AC
  Filled 2018-12-23: qty 2

## 2018-12-23 MED ORDER — CEFAZOLIN SODIUM-DEXTROSE 2-3 GM-%(50ML) IV SOLR
INTRAVENOUS | Status: DC | PRN
  Administered 2018-12-23: 2 g via INTRAVENOUS

## 2018-12-23 MED ORDER — SCOPOLAMINE 1 MG/3DAYS TD PT72
MEDICATED_PATCH | TRANSDERMAL | Status: AC
  Filled 2018-12-23: qty 1

## 2018-12-23 SURGICAL SUPPLY — 37 items
BNDG COHESIVE 2X5 TAN STRL LF (GAUZE/BANDAGES/DRESSINGS) IMPLANT
CANISTER SUCT 1200ML W/VALVE (MISCELLANEOUS) ×3 IMPLANT
CATH ROBINSON RED A/P 10FR (CATHETERS) IMPLANT
CATH ROBINSON RED A/P 14FR (CATHETERS) ×2 IMPLANT
COAGULATOR SUCT 6 FR SWTCH (ELECTROSURGICAL)
COAGULATOR SUCT SWTCH 10FR 6 (ELECTROSURGICAL) IMPLANT
COVER BACK TABLE REUSABLE LG (DRAPES) ×3 IMPLANT
COVER MAYO STAND REUSABLE (DRAPES) ×3 IMPLANT
COVER WAND RF STERILE (DRAPES) IMPLANT
DRAPE HALF SHEET 70X43 (DRAPES) ×3 IMPLANT
ELECT REM PT RETURN 9FT ADLT (ELECTROSURGICAL) ×3
ELECT REM PT RETURN 9FT PED (ELECTROSURGICAL)
ELECTRODE REM PT RETRN 9FT PED (ELECTROSURGICAL) IMPLANT
ELECTRODE REM PT RTRN 9FT ADLT (ELECTROSURGICAL) IMPLANT
GAUZE SPONGE 4X4 12PLY STRL LF (GAUZE/BANDAGES/DRESSINGS) ×3 IMPLANT
GLOVE BIO SURGEON STRL SZ 6.5 (GLOVE) ×1 IMPLANT
GLOVE BIO SURGEON STRL SZ7.5 (GLOVE) ×3 IMPLANT
GLOVE BIO SURGEONS STRL SZ 6.5 (GLOVE) ×1
GLOVE BIOGEL PI IND STRL 6.5 (GLOVE) IMPLANT
GLOVE BIOGEL PI INDICATOR 6.5 (GLOVE) ×2
GOWN STRL REUS W/ TWL LRG LVL3 (GOWN DISPOSABLE) ×2 IMPLANT
GOWN STRL REUS W/ TWL XL LVL3 (GOWN DISPOSABLE) IMPLANT
GOWN STRL REUS W/TWL LRG LVL3 (GOWN DISPOSABLE) ×3
GOWN STRL REUS W/TWL XL LVL3 (GOWN DISPOSABLE) ×3
IV NS 500ML (IV SOLUTION) ×3
IV NS 500ML BAXH (IV SOLUTION) ×1 IMPLANT
MARKER SKIN DUAL TIP RULER LAB (MISCELLANEOUS) IMPLANT
NS IRRIG 1000ML POUR BTL (IV SOLUTION) ×3 IMPLANT
SOLUTION BUTLER CLEAR DIP (MISCELLANEOUS) ×3 IMPLANT
SPONGE TONSIL TAPE 1.25 RFD (DISPOSABLE) ×3 IMPLANT
SYR BULB 3OZ (MISCELLANEOUS) IMPLANT
TOWEL GREEN STERILE FF (TOWEL DISPOSABLE) ×3 IMPLANT
TUBE CONNECTING 20'X1/4 (TUBING) ×1
TUBE CONNECTING 20X1/4 (TUBING) ×2 IMPLANT
TUBE SALEM SUMP 12R W/ARV (TUBING) IMPLANT
TUBE SALEM SUMP 16 FR W/ARV (TUBING) ×2 IMPLANT
WAND COBLATOR 70 EVAC XTRA (SURGICAL WAND) ×3 IMPLANT

## 2018-12-23 NOTE — Anesthesia Procedure Notes (Signed)
Procedure Name: Intubation Date/Time: 12/23/2018 7:35 AM Performed by: Lieutenant Diego, CRNA Pre-anesthesia Checklist: Patient identified, Emergency Drugs available, Suction available and Patient being monitored Patient Re-evaluated:Patient Re-evaluated prior to induction Oxygen Delivery Method: Circle system utilized Preoxygenation: Pre-oxygenation with 100% oxygen Induction Type: IV induction Ventilation: Mask ventilation without difficulty Laryngoscope Size: Miller and 2 Grade View: Grade I Tube type: Oral Tube size: 7.0 mm Number of attempts: 1 Airway Equipment and Method: Stylet and Oral airway Placement Confirmation: ETT inserted through vocal cords under direct vision,  positive ETCO2 and breath sounds checked- equal and bilateral Secured at: 22 cm Tube secured with: Tape Dental Injury: Teeth and Oropharynx as per pre-operative assessment

## 2018-12-23 NOTE — H&P (Signed)
Cc: Chronic tonsillitis  HPI: The patient is a 43 year old female who presents today complaining of chronic tonsillitis.  The patient is seen in consultation requested by Mena Pauls, FNP.  According to the patient, she has a history of frequent recurrent strep infections her entire life.  However, the severity and frequency of her tonsillitis has significantly increased over the past 2 to 3 months.  She has noted a constant sore throat and enlarged tonsils over the past 3 months.  She was treated with Clindamycin, Amoxicillin and Prednisone with persistent symptoms.  She snores at night.  However, she denies any witnessed apnea.  She has no previous history of ENT surgery.   The patient's review of systems (constitutional, eyes, ENT, cardiovascular, respiratory, GI, musculoskeletal, skin, neurologic, psychiatric, endocrine, hematologic, allergic) is noted in the ROS questionnaire.  It is reviewed with the patient.  Family health history: Diabetes, heart disease.  Major events: None.  Ongoing medical problems: Hypertension, asthma, arthritis, chronic bronchitis, , diabetes, migraine, reflux, allergies, hay fever.  Social history: The patient is single. She denies the use of tobacco, alcohol or illegal drugs.    Exam General: Communicates without difficulty, well nourished, no acute distress. Head: Normocephalic, no evidence injury, no tenderness, facial buttresses intact without stepoff. Face/sinus: No tenderness to palpation and percussion. Facial movement is normal and symmetric. Eyes: PERRL, EOMI. No scleral icterus, conjunctivae clear. Neuro: CN II exam reveals vision grossly intact.  No nystagmus at any point of gaze. Ears: Auricles well formed without lesions.  Ear canals are intact without mass or lesion.  No erythema or edema is appreciated.  The TMs are intact without fluid. Nose: External evaluation reveals normal support and skin without lesions.  Dorsum is intact.  Anterior rhinoscopy  reveals pink mucosa over anterior aspect of inferior turbinates and intact septum.  No purulence noted. Oral:  Oral cavity and oropharynx are intact, symmetric.  Mucosa is moist without lesions. 3+ cryptic tonsils bilaterally. Neck: Full range of motion without pain.  There is no significant lymphadenopathy.  No masses palpable.  Thyroid bed within normal limits to palpation.  Parotid glands and submandibular glands equal bilaterally without mass.  Trachea is midline. Neuro:  CN 2-12 grossly intact. Gait normal.   Assessment 1.  The patient's history and physical exam findings are consistent with chronic tonsillitis/pharyngitis, secondary to significant tonsillar hypertrophy.  No suspicious mass or lesion is noted today.   Plan  1.  The physical exam findings are reviewed with the patient.  2.  Based on the above findings, the patient may benefit from more definitive treatment with adenotonsillectomy surgery.  The alternative of conservative observation and medical treatment are also discussed.  The risks, benefits, alternatives and details of the treatment options are reviewed.  3.  The patient is interested in proceeding with the adenotonsillectomy procedure.

## 2018-12-23 NOTE — Transfer of Care (Signed)
Immediate Anesthesia Transfer of Care Note  Patient: Lori Shea  Procedure(s) Performed: TONSILLECTOMY AND ADENOIDECTOMY (Bilateral Throat)  Patient Location: PACU  Anesthesia Type:General  Level of Consciousness: awake  Airway & Oxygen Therapy: Patient Spontanous Breathing and Patient connected to face mask oxygen  Post-op Assessment: Report given to RN and Post -op Vital signs reviewed and stable  Post vital signs: Reviewed and stable  Last Vitals:  Vitals Value Taken Time  BP    Temp    Pulse 100 12/23/18 0815  Resp 14 12/23/18 0815  SpO2 100 % 12/23/18 0815  Vitals shown include unvalidated device data.  Last Pain:  Vitals:   12/23/18 0711  TempSrc: Oral  PainSc: 0-No pain         Complications: No apparent anesthesia complications

## 2018-12-23 NOTE — Anesthesia Postprocedure Evaluation (Signed)
Anesthesia Post Note  Patient: Lori Shea  Procedure(s) Performed: TONSILLECTOMY AND ADENOIDECTOMY (Bilateral Throat)     Patient location during evaluation: PACU Anesthesia Type: General Level of consciousness: awake and alert Pain management: pain level controlled Vital Signs Assessment: post-procedure vital signs reviewed and stable Respiratory status: spontaneous breathing, nonlabored ventilation, respiratory function stable and patient connected to nasal cannula oxygen Cardiovascular status: blood pressure returned to baseline and stable Postop Assessment: no apparent nausea or vomiting Anesthetic complications: no    Last Vitals:  Vitals:   12/23/18 0908 12/23/18 0930  BP: 121/87 129/84  Pulse: 91 89  Resp: 12 16  Temp:  36.9 C  SpO2: 94% 96%    Last Pain:  Vitals:   12/23/18 0930  TempSrc:   PainSc: 5                  Catalina Gravel

## 2018-12-23 NOTE — Op Note (Signed)
DATE OF PROCEDURE:  12/23/2018                              OPERATIVE REPORT  SURGEON:  Leta Baptist, MD  PREOPERATIVE DIAGNOSES: 1. Adenotonsillar hypertrophy. 2. Chronic tonsillitis and pharyngitis  POSTOPERATIVE DIAGNOSES: 1. Adenotonsillar hypertrophy. 2. Chronic tonsillitis and pharyngitis  PROCEDURE PERFORMED:  Adenotonsillectomy.  ANESTHESIA:  General endotracheal tube anesthesia.  COMPLICATIONS:  None.  ESTIMATED BLOOD LOSS:  Minimal.  INDICATION FOR PROCEDURE:  Lori Shea is a 43 y.o. female with a history of chronic tonsillitis/pharyngitis and recurrent strep infections.  According to the patient, she has been experiencing chronic throat discomfort with tonsillitis for several years. The patient continued to be symptomatic despite medical treatments. On examination, the patient was noted to have bilateral cryptic tonsils, with numerous tonsilloliths. Based on the above findings, the decision was made for the patient to undergo the adenotonsillectomy procedure. Likelihood of success in reducing symptoms was also discussed.  The risks, benefits, alternatives, and details of the procedure were discussed with the patient.  Questions were invited and answered.  Informed consent was obtained.  DESCRIPTION:  The patient was taken to the operating room and placed supine on the operating table.  General endotracheal tube anesthesia was administered by the anesthesiologist.  The patient was positioned and prepped and draped in a standard fashion for adenotonsillectomy.  A Crowe-Davis mouth gag was inserted into the oral cavity for exposure. 3+ cryptic tonsils were noted bilaterally.  No bifidity was noted.  Indirect mirror examination of the nasopharynx revealed mild adenoid hypertrophy. The adenoid was ablated with the Coblator device. Hemostasis was achieved with the Coblator device.  The right tonsil was then grasped with a straight Allis clamp and retracted medially.  It was resected free  from the underlying pharyngeal constrictor muscles with the Coblator device.  The same procedure was repeated on the left side without exception.  The surgical sites were copiously irrigated.  The mouth gag was removed.  The care of the patient was turned over to the anesthesiologist.  The patient was awakened from anesthesia without difficulty.  The patient was extubated and transferred to the recovery room in good condition.  OPERATIVE FINDINGS:  Adenotonsillar hypertrophy.  SPECIMEN:  Bilateral tonsils  FOLLOWUP CARE:  The patient will be discharged home once awake and alert.  She will be placed on amoxicillin 800 mg p.o. b.i.d. for 5 days, and percocet po q 4 hours for postop pain control.   The patient will follow up in my office in approximately 2 weeks.  Odies Desa W Saryiah Bencosme 12/23/2018 8:13 AM

## 2018-12-23 NOTE — Anesthesia Preprocedure Evaluation (Addendum)
Anesthesia Evaluation  Patient identified by MRN, date of birth, ID band Patient awake  General Assessment Comment:ENLARGED TONSILS AND ADENOIDS  Reviewed: Allergy & Precautions, NPO status , Patient's Chart, lab work & pertinent test results  Airway Mallampati: I  TM Distance: >3 FB Neck ROM: Full    Dental  (+) Teeth Intact, Dental Advisory Given   Pulmonary asthma ,    Pulmonary exam normal breath sounds clear to auscultation       Cardiovascular hypertension, Pt. on medications Normal cardiovascular exam Rhythm:Regular Rate:Normal     Neuro/Psych  Headaches, negative psych ROS   GI/Hepatic Neg liver ROS, GERD  Medicated,  Endo/Other  diabetes, Type 2, Oral Hypoglycemic Agents, Insulin DependentMorbid obesity  Renal/GU negative Renal ROS     Musculoskeletal negative musculoskeletal ROS (+)   Abdominal   Peds  Hematology negative hematology ROS (+)   Anesthesia Other Findings Day of surgery medications reviewed with the patient.  Reproductive/Obstetrics                           Anesthesia Physical Anesthesia Plan  ASA: III  Anesthesia Plan: General   Post-op Pain Management:    Induction: Intravenous  PONV Risk Score and Plan: 4 or greater and Scopolamine patch - Pre-op, Midazolam, Dexamethasone, Ondansetron and Diphenhydramine  Airway Management Planned: Oral ETT  Additional Equipment:   Intra-op Plan:   Post-operative Plan: Extubation in OR  Informed Consent: I have reviewed the patients History and Physical, chart, labs and discussed the procedure including the risks, benefits and alternatives for the proposed anesthesia with the patient or authorized representative who has indicated his/her understanding and acceptance.     Dental advisory given  Plan Discussed with: CRNA  Anesthesia Plan Comments:         Anesthesia Quick Evaluation

## 2018-12-23 NOTE — Discharge Instructions (Addendum)
Next Dose of Tylenol at 1 PM   [facility] M[facility] Postoperative Instructions for Tonsillectomy & Adenoidectomy (T&A) Activity Restrict activity at home for the first two days, resting as much as possible. Light indoor activity is best. You may usually return to school or work within a week but void strenuous activity and sports for two weeks. Sleep with your head elevated on 2-3 pillows for 3-4 days to help decrease swelling. Diet Due to tissue swelling and throat discomfort, you may have little desire to drink for several days. However fluids are very important to prevent dehydration. You will find that non-acidic juices, soups, popsicles, Jell-O, custard, puddings, and any soft or mashed foods taken in small quantities can be swallowed fairly easily. Try to increase your fluid and food intake as the discomfort subsides. It is recommended that a child receive 1-1/2 quarts of fluid in a 24-hour period. Adult require twice this amount.  Discomfort Your sore throat may be relieved by applying an ice collar to your neck and/or by taking Tylenol. You may experience an earache, which is due to referred pain from the throat. Referred ear pain is commonly felt at night when trying to rest.  Bleeding                        Although rare, there is risk of having some bleeding during the first 2 weeks after having a T&A. This usually happens between days 7-10 postoperatively. If you or your child should have any bleeding, try to remain calm. We recommend sitting up quietly in a chair and gently spitting out the blood into a bowl. For adults, gargling gently with ice water may help. If the bleeding does not stop after a short time (5 minutes), is more than 1 teaspoonful, or if you become worried, please call our office at [phone] or go directly to the nearest hospital emergency room. Do not eat or drink anything prior to going to the hospital as you may need to be taken to the operating room in order  to control the bleeding. GENERAL CONSIDERATIONS 1. Brush your teeth regularly. Avoid mouthwashes and gargles for three weeks. You may gargle gently with warm salt-water as necessary or spray with Chloraseptic. You may make salt-water by placing 2 teaspoons of table salt into a quart of fresh water. Warm the salt-water in a microwave to a luke warm temperature.  2. Avoid exposure to colds and upper respiratory infections if possible.  3. If you look into a mirror or into your child's mouth, you will see white-gray patches in the back of the throat. This is normal after having a T&A and is like a scab that forms on the skin after an abrasion. It will disappear once the back of the throat heals completely. However, it may cause a noticeable odor; this too will disappear with time. Again, warm salt-water gargles may be used to help keep the throat clean and promote healing.  4. You may notice a temporary change in voice quality, such as a higher pitched voice or a nasal sound, until healing is complete. This may last for 1-2 weeks and should resolve.  5. Do not take or give you child any medications that we have not prescribed or recommended.  6. Snoring may occur, especially at night, for the first week after a T&A. It is due to swelling of the soft palate and will usually resolve.  Please call our office at  [phone] if you have any questions.      Post Anesthesia Home Care Instructions  Activity: Get plenty of rest for the remainder of the day. A responsible individual must stay with you for 24 hours following the procedure.  For the next 24 hours, DO NOT: -Drive a car -Paediatric nurse -Drink alcoholic beverages -Take any medication unless instructed by your physician -Make any legal decisions or sign important papers.  Meals: Start with liquid foods such as gelatin or soup. Progress to regular foods as tolerated. Avoid greasy, spicy, heavy foods. If nausea and/or vomiting occur, drink  only clear liquids until the nausea and/or vomiting subsides. Call your physician if vomiting continues.  Special Instructions/Symptoms: Your throat may feel dry or sore from the anesthesia or the breathing tube placed in your throat during surgery. If this causes discomfort, gargle with warm salt water. The discomfort should disappear within 24 hours.  If you had a scopolamine patch placed behind your ear for the management of post- operative nausea and/or vomiting: Please Remove By Friday at 7 AM.  1. The medication in the patch is effective for 72 hours, after which it should be removed.  Wrap patch in a tissue and discard in the trash. Wash hands thoroughly with soap and water. 2. You may remove the patch earlier than 72 hours if you experience unpleasant side effects which may include dry mouth, dizziness or visual disturbances. 3. Avoid touching the patch. Wash your hands with soap and water after contact with the patch.

## 2018-12-24 ENCOUNTER — Encounter (HOSPITAL_BASED_OUTPATIENT_CLINIC_OR_DEPARTMENT_OTHER): Payer: Self-pay | Admitting: Otolaryngology

## 2018-12-31 ENCOUNTER — Encounter (HOSPITAL_COMMUNITY): Payer: Self-pay

## 2018-12-31 ENCOUNTER — Encounter (HOSPITAL_COMMUNITY): Payer: Self-pay | Admitting: *Deleted

## 2019-01-08 ENCOUNTER — Ambulatory Visit (INDEPENDENT_AMBULATORY_CARE_PROVIDER_SITE_OTHER): Payer: Medicaid Other | Admitting: Otolaryngology

## 2019-01-19 ENCOUNTER — Other Ambulatory Visit: Payer: Self-pay | Admitting: Family Medicine

## 2019-01-19 DIAGNOSIS — K219 Gastro-esophageal reflux disease without esophagitis: Secondary | ICD-10-CM

## 2019-01-21 ENCOUNTER — Encounter (HOSPITAL_BASED_OUTPATIENT_CLINIC_OR_DEPARTMENT_OTHER): Payer: Self-pay | Admitting: *Deleted

## 2019-01-21 ENCOUNTER — Other Ambulatory Visit: Payer: Self-pay

## 2019-01-24 ENCOUNTER — Other Ambulatory Visit (HOSPITAL_COMMUNITY): Payer: Medicaid Other

## 2019-01-26 ENCOUNTER — Other Ambulatory Visit: Payer: Self-pay | Admitting: Obstetrics and Gynecology

## 2019-01-26 ENCOUNTER — Other Ambulatory Visit (HOSPITAL_COMMUNITY)
Admission: RE | Admit: 2019-01-26 | Discharge: 2019-01-26 | Disposition: A | Discharge status: Home / Self Care | Payer: Medicaid Other | Source: Ambulatory Visit | Attending: Obstetrics and Gynecology | Admitting: Obstetrics and Gynecology

## 2019-01-26 DIAGNOSIS — Z20828 Contact with and (suspected) exposure to other viral communicable diseases: Secondary | ICD-10-CM | POA: Diagnosis not present

## 2019-01-26 DIAGNOSIS — Z01812 Encounter for preprocedural laboratory examination: Secondary | ICD-10-CM | POA: Diagnosis not present

## 2019-01-26 LAB — SARS CORONAVIRUS 2 (TAT 6-24 HRS): SARS Coronavirus 2: NEGATIVE

## 2019-01-28 ENCOUNTER — Other Ambulatory Visit: Payer: Self-pay

## 2019-01-28 ENCOUNTER — Ambulatory Visit (HOSPITAL_BASED_OUTPATIENT_CLINIC_OR_DEPARTMENT_OTHER): Payer: Medicaid Other | Admitting: Certified Registered"

## 2019-01-28 ENCOUNTER — Encounter (HOSPITAL_BASED_OUTPATIENT_CLINIC_OR_DEPARTMENT_OTHER): Payer: Self-pay

## 2019-01-28 ENCOUNTER — Encounter (HOSPITAL_BASED_OUTPATIENT_CLINIC_OR_DEPARTMENT_OTHER)
Admission: RE | Disposition: A | Discharge status: Home / Self Care | Payer: Self-pay | Source: Home / Self Care | Attending: Obstetrics and Gynecology

## 2019-01-28 ENCOUNTER — Ambulatory Visit (HOSPITAL_BASED_OUTPATIENT_CLINIC_OR_DEPARTMENT_OTHER)
Admission: RE | Admit: 2019-01-28 | Discharge: 2019-01-28 | Disposition: A | Discharge status: Home / Self Care | Payer: Medicaid Other | Attending: Obstetrics and Gynecology | Admitting: Obstetrics and Gynecology

## 2019-01-28 DIAGNOSIS — Z91018 Allergy to other foods: Secondary | ICD-10-CM | POA: Insufficient documentation

## 2019-01-28 DIAGNOSIS — Z302 Encounter for sterilization: Secondary | ICD-10-CM | POA: Diagnosis not present

## 2019-01-28 DIAGNOSIS — Z8249 Family history of ischemic heart disease and other diseases of the circulatory system: Secondary | ICD-10-CM | POA: Insufficient documentation

## 2019-01-28 DIAGNOSIS — R87610 Atypical squamous cells of undetermined significance on cytologic smear of cervix (ASC-US): Secondary | ICD-10-CM | POA: Diagnosis not present

## 2019-01-28 DIAGNOSIS — Z8742 Personal history of other diseases of the female genital tract: Secondary | ICD-10-CM | POA: Diagnosis present

## 2019-01-28 DIAGNOSIS — Z833 Family history of diabetes mellitus: Secondary | ICD-10-CM | POA: Diagnosis not present

## 2019-01-28 DIAGNOSIS — Z794 Long term (current) use of insulin: Secondary | ICD-10-CM | POA: Insufficient documentation

## 2019-01-28 DIAGNOSIS — Z7951 Long term (current) use of inhaled steroids: Secondary | ICD-10-CM | POA: Insufficient documentation

## 2019-01-28 DIAGNOSIS — J45909 Unspecified asthma, uncomplicated: Secondary | ICD-10-CM | POA: Insufficient documentation

## 2019-01-28 DIAGNOSIS — Z79899 Other long term (current) drug therapy: Secondary | ICD-10-CM | POA: Insufficient documentation

## 2019-01-28 DIAGNOSIS — G43909 Migraine, unspecified, not intractable, without status migrainosus: Secondary | ICD-10-CM | POA: Insufficient documentation

## 2019-01-28 DIAGNOSIS — R87810 Cervical high risk human papillomavirus (HPV) DNA test positive: Secondary | ICD-10-CM | POA: Diagnosis not present

## 2019-01-28 DIAGNOSIS — Z793 Long term (current) use of hormonal contraceptives: Secondary | ICD-10-CM | POA: Diagnosis not present

## 2019-01-28 DIAGNOSIS — Z888 Allergy status to other drugs, medicaments and biological substances status: Secondary | ICD-10-CM | POA: Insufficient documentation

## 2019-01-28 DIAGNOSIS — Z6841 Body Mass Index (BMI) 40.0 and over, adult: Secondary | ICD-10-CM | POA: Insufficient documentation

## 2019-01-28 DIAGNOSIS — E282 Polycystic ovarian syndrome: Secondary | ICD-10-CM | POA: Insufficient documentation

## 2019-01-28 DIAGNOSIS — Z87442 Personal history of urinary calculi: Secondary | ICD-10-CM | POA: Insufficient documentation

## 2019-01-28 DIAGNOSIS — Z791 Long term (current) use of non-steroidal anti-inflammatories (NSAID): Secondary | ICD-10-CM | POA: Insufficient documentation

## 2019-01-28 DIAGNOSIS — E119 Type 2 diabetes mellitus without complications: Secondary | ICD-10-CM | POA: Insufficient documentation

## 2019-01-28 DIAGNOSIS — F419 Anxiety disorder, unspecified: Secondary | ICD-10-CM | POA: Insufficient documentation

## 2019-01-28 DIAGNOSIS — Z9889 Other specified postprocedural states: Secondary | ICD-10-CM

## 2019-01-28 DIAGNOSIS — I10 Essential (primary) hypertension: Secondary | ICD-10-CM | POA: Insufficient documentation

## 2019-01-28 HISTORY — PX: LAPAROSCOPIC TUBAL LIGATION: SHX1937

## 2019-01-28 HISTORY — DX: Personal history of urinary calculi: Z87.442

## 2019-01-28 LAB — GLUCOSE, CAPILLARY
Glucose-Capillary: 185 mg/dL — ABNORMAL HIGH (ref 70–99)
Glucose-Capillary: 174 mg/dL — ABNORMAL HIGH (ref 70–99)

## 2019-01-28 LAB — POCT PREGNANCY, URINE: Preg Test, Ur: NEGATIVE

## 2019-01-28 SURGERY — LIGATION, FALLOPIAN TUBE, LAPAROSCOPIC
Anesthesia: General | Site: Abdomen | Laterality: Bilateral

## 2019-01-28 MED ORDER — SUGAMMADEX SODIUM 500 MG/5ML IV SOLN
INTRAVENOUS | Status: AC
  Filled 2019-01-28: qty 5

## 2019-01-28 MED ORDER — KETOROLAC TROMETHAMINE 30 MG/ML IJ SOLN
INTRAMUSCULAR | Status: AC
  Filled 2019-01-28: qty 1

## 2019-01-28 MED ORDER — BUPIVACAINE HCL 0.5 % IJ SOLN
INTRAMUSCULAR | Status: DC | PRN
  Administered 2019-01-28 (×2): 10 mL

## 2019-01-28 MED ORDER — OXYCODONE HCL 5 MG PO TABS
ORAL_TABLET | ORAL | Status: AC
  Filled 2019-01-28: qty 1

## 2019-01-28 MED ORDER — MIDAZOLAM HCL 2 MG/2ML IJ SOLN
1.0000 mg | INTRAMUSCULAR | Status: DC | PRN
  Administered 2019-01-28: 09:00:00 2 mg via INTRAVENOUS

## 2019-01-28 MED ORDER — LIDOCAINE 2% (20 MG/ML) 5 ML SYRINGE
INTRAMUSCULAR | Status: AC
  Filled 2019-01-28: qty 5

## 2019-01-28 MED ORDER — LACTATED RINGERS IV SOLN: INTRAVENOUS | Status: DC

## 2019-01-28 MED ORDER — HYDROMORPHONE HCL 1 MG/ML IJ SOLN
0.2500 mg | INTRAMUSCULAR | Status: DC | PRN
  Administered 2019-01-28: 0.5 mg via INTRAVENOUS

## 2019-01-28 MED ORDER — OXYCODONE HCL 5 MG/5ML PO SOLN: 5.0000 mg | Freq: Once | ORAL | Status: AC | PRN

## 2019-01-28 MED ORDER — FENTANYL CITRATE (PF) 100 MCG/2ML IJ SOLN
INTRAMUSCULAR | Status: AC
  Filled 2019-01-28: qty 2

## 2019-01-28 MED ORDER — MIDAZOLAM HCL 2 MG/2ML IJ SOLN
INTRAMUSCULAR | Status: AC
  Filled 2019-01-28: qty 2

## 2019-01-28 MED ORDER — PROMETHAZINE HCL 25 MG/ML IJ SOLN
INTRAMUSCULAR | Status: AC
  Filled 2019-01-28: qty 1

## 2019-01-28 MED ORDER — ACETAMINOPHEN 500 MG PO TABS: 1000.0000 mg | ORAL_TABLET | Freq: Once | ORAL | Status: DC

## 2019-01-28 MED ORDER — LACTATED RINGERS IV SOLN
INTRAVENOUS | Status: DC
  Administered 2019-01-28 (×2): via INTRAVENOUS

## 2019-01-28 MED ORDER — MEPERIDINE HCL 25 MG/ML IJ SOLN: 6.2500 mg | INTRAMUSCULAR | Status: DC | PRN

## 2019-01-28 MED ORDER — ROCURONIUM BROMIDE 10 MG/ML (PF) SYRINGE
PREFILLED_SYRINGE | INTRAVENOUS | Status: DC | PRN
  Administered 2019-01-28: 60 mg via INTRAVENOUS

## 2019-01-28 MED ORDER — PROPOFOL 10 MG/ML IV BOLUS
INTRAVENOUS | Status: DC | PRN
  Administered 2019-01-28: 150 mg via INTRAVENOUS

## 2019-01-28 MED ORDER — SUGAMMADEX SODIUM 200 MG/2ML IV SOLN
INTRAVENOUS | Status: DC | PRN
  Administered 2019-01-28: 300 mg via INTRAVENOUS

## 2019-01-28 MED ORDER — DEXAMETHASONE SODIUM PHOSPHATE 10 MG/ML IJ SOLN
INTRAMUSCULAR | Status: DC | PRN
  Administered 2019-01-28: 5 mg via INTRAVENOUS

## 2019-01-28 MED ORDER — PROPOFOL 10 MG/ML IV BOLUS
INTRAVENOUS | Status: AC
  Filled 2019-01-28: qty 20

## 2019-01-28 MED ORDER — OXYCODONE-ACETAMINOPHEN 5-325 MG PO TABS: 1.0000 | ORAL_TABLET | Freq: Four times a day (QID) | ORAL | 0 refills | Status: DC | PRN

## 2019-01-28 MED ORDER — SUCCINYLCHOLINE CHLORIDE 200 MG/10ML IV SOSY
PREFILLED_SYRINGE | INTRAVENOUS | Status: AC
  Filled 2019-01-28: qty 10

## 2019-01-28 MED ORDER — BUPIVACAINE HCL (PF) 0.5 % IJ SOLN
INTRAMUSCULAR | Status: AC
  Filled 2019-01-28: qty 30

## 2019-01-28 MED ORDER — FENTANYL CITRATE (PF) 100 MCG/2ML IJ SOLN
50.0000 ug | INTRAMUSCULAR | Status: AC | PRN
  Administered 2019-01-28: 09:00:00 100 ug via INTRAVENOUS
  Administered 2019-01-28 (×2): 50 ug via INTRAVENOUS

## 2019-01-28 MED ORDER — OXYCODONE HCL 5 MG PO TABS
5.0000 mg | ORAL_TABLET | Freq: Once | ORAL | Status: AC | PRN
  Administered 2019-01-28: 5 mg via ORAL

## 2019-01-28 MED ORDER — SILVER NITRATE-POT NITRATE 75-25 % EX MISC
CUTANEOUS | Status: AC
  Filled 2019-01-28: qty 1

## 2019-01-28 MED ORDER — SODIUM CHLORIDE (PF) 0.9 % IJ SOLN
INTRAMUSCULAR | Status: AC
  Filled 2019-01-28: qty 10

## 2019-01-28 MED ORDER — EPHEDRINE 5 MG/ML INJ
INTRAVENOUS | Status: AC
  Filled 2019-01-28: qty 10

## 2019-01-28 MED ORDER — DEXAMETHASONE SODIUM PHOSPHATE 10 MG/ML IJ SOLN
INTRAMUSCULAR | Status: AC
  Filled 2019-01-28: qty 1

## 2019-01-28 MED ORDER — PHENYLEPHRINE 40 MCG/ML (10ML) SYRINGE FOR IV PUSH (FOR BLOOD PRESSURE SUPPORT)
PREFILLED_SYRINGE | INTRAVENOUS | Status: AC
  Filled 2019-01-28: qty 10

## 2019-01-28 MED ORDER — ROCURONIUM BROMIDE 10 MG/ML (PF) SYRINGE
PREFILLED_SYRINGE | INTRAVENOUS | Status: AC
  Filled 2019-01-28: qty 10

## 2019-01-28 MED ORDER — HYDROMORPHONE HCL 1 MG/ML IJ SOLN
INTRAMUSCULAR | Status: AC
  Filled 2019-01-28: qty 0.5

## 2019-01-28 MED ORDER — KETOROLAC TROMETHAMINE 30 MG/ML IJ SOLN
30.0000 mg | Freq: Once | INTRAMUSCULAR | Status: AC | PRN
  Administered 2019-01-28: 30 mg via INTRAVENOUS

## 2019-01-28 MED ORDER — POLYETHYLENE GLYCOL 3350 17 G PO PACK: 17.0000 g | PACK | Freq: Every day | ORAL | 1 refills | Status: AC

## 2019-01-28 MED ORDER — SUCCINYLCHOLINE CHLORIDE 20 MG/ML IJ SOLN
INTRAMUSCULAR | Status: DC | PRN
  Administered 2019-01-28: 100 mg via INTRAVENOUS

## 2019-01-28 MED ORDER — PROMETHAZINE HCL 25 MG/ML IJ SOLN
6.2500 mg | INTRAMUSCULAR | Status: DC | PRN
  Administered 2019-01-28: 6.25 mg via INTRAVENOUS

## 2019-01-28 MED ORDER — LIDOCAINE 2% (20 MG/ML) 5 ML SYRINGE
INTRAMUSCULAR | Status: DC | PRN
  Administered 2019-01-28: 40 mg via INTRAVENOUS

## 2019-01-28 MED ORDER — SCOPOLAMINE 1 MG/3DAYS TD PT72: 1.0000 | MEDICATED_PATCH | TRANSDERMAL | Status: DC

## 2019-01-28 SURGICAL SUPPLY — 44 items
ADH SKN CLS APL DERMABOND .7 (GAUZE/BANDAGES/DRESSINGS) ×1
BLADE SURG 15 STRL LF DISP TIS (BLADE) ×1 IMPLANT
BLADE SURG 15 STRL SS (BLADE) ×3
BRIEF STRETCH FOR OB PAD XXL (UNDERPADS AND DIAPERS) ×3 IMPLANT
CATH ROBINSON RED A/P 14FR (CATHETERS) ×3 IMPLANT
CLIP FILSHIE TUBAL LIGA STRL (Clip) ×2 IMPLANT
DERMABOND ADVANCED (GAUZE/BANDAGES/DRESSINGS) ×2
DERMABOND ADVANCED .7 DNX12 (GAUZE/BANDAGES/DRESSINGS) ×1 IMPLANT
DRSG OPSITE POSTOP 3X4 (GAUZE/BANDAGES/DRESSINGS) ×3 IMPLANT
DURAPREP 26ML APPLICATOR (WOUND CARE) ×3 IMPLANT
GAUZE 4X4 16PLY RFD (DISPOSABLE) ×3 IMPLANT
GLOVE BIOGEL PI IND STRL 7.0 (GLOVE) ×2 IMPLANT
GLOVE BIOGEL PI IND STRL 7.5 (GLOVE) ×2 IMPLANT
GLOVE BIOGEL PI INDICATOR 7.0 (GLOVE) ×4
GLOVE BIOGEL PI INDICATOR 7.5 (GLOVE) ×4
GLOVE SURG SS PI 7.0 STRL IVOR (GLOVE) ×3 IMPLANT
GOWN STRL REUS W/ TWL LRG LVL3 (GOWN DISPOSABLE) ×1 IMPLANT
GOWN STRL REUS W/TWL LRG LVL3 (GOWN DISPOSABLE) ×6 IMPLANT
NDL INSUFFLATION 14GA 120MM (NEEDLE) IMPLANT
NEEDLE INSUFFLATION 14GA 120MM (NEEDLE) IMPLANT
NEEDLE INSUFFLATION 150MM (ENDOMECHANICALS) IMPLANT
PACK LAPAROSCOPY BASIN (CUSTOM PROCEDURE TRAY) ×3 IMPLANT
PACK TRENDGUARD 450 HYBRID PRO (MISCELLANEOUS) IMPLANT
PACK TRENDGUARD 600 HYBRD PROC (MISCELLANEOUS) IMPLANT
PAD ARMBOARD 7.5X6 YLW CONV (MISCELLANEOUS) ×6 IMPLANT
PAD OB MATERNITY 4.3X12.25 (PERSONAL CARE ITEMS) ×3 IMPLANT
PAD PREP 24X48 CUFFED NSTRL (MISCELLANEOUS) ×3 IMPLANT
SET TUBE SMOKE EVAC HIGH FLOW (TUBING) ×3 IMPLANT
SLEEVE SCD COMPRESS KNEE MED (MISCELLANEOUS) ×3 IMPLANT
SUT MNCRL AB 4-0 PS2 18 (SUTURE) ×3 IMPLANT
SUT PLAIN 2 0 (SUTURE)
SUT PLAIN ABS 2-0 CT1 27XMFL (SUTURE) IMPLANT
SUT VICRYL 0 UR6 27IN ABS (SUTURE) ×3 IMPLANT
SUT VICRYL 4-0 PS2 18IN ABS (SUTURE) IMPLANT
TOWEL GREEN STERILE FF (TOWEL DISPOSABLE) ×3 IMPLANT
TRAY FOLEY W/BAG SLVR 14FR LF (SET/KITS/TRAYS/PACK) IMPLANT
TRENDGUARD 450 HYBRID PRO PACK (MISCELLANEOUS)
TRENDGUARD 600 HYBRID PROC PK (MISCELLANEOUS) ×3
TROCAR 5M 150ML BLDLS (TROCAR) IMPLANT
TROCAR ADV FIXATION 5X100MM (TROCAR) IMPLANT
TROCAR BALLN 12MMX100 BLUNT (TROCAR) ×1 IMPLANT
TROCAR BALLN GELPORT 12X130M (ENDOMECHANICALS) ×2 IMPLANT
TROCAR XCEL NON-BLD 11X100MML (ENDOMECHANICALS) IMPLANT
WARMER LAPAROSCOPE (MISCELLANEOUS) ×3 IMPLANT

## 2019-01-28 NOTE — Anesthesia Procedure Notes (Signed)
Procedure Name: Intubation Date/Time: 01/28/2019 8:35 AM Performed by: Myna Bright, CRNA Pre-anesthesia Checklist: Patient identified, Emergency Drugs available, Suction available and Patient being monitored Patient Re-evaluated:Patient Re-evaluated prior to induction Preoxygenation: Pre-oxygenation with 100% oxygen Induction Type: IV induction Ventilation: Mask ventilation without difficulty Laryngoscope Size: Mac and 3 Tube type: Oral Tube size: 7.0 mm Number of attempts: 1 Airway Equipment and Method: Stylet Placement Confirmation: ETT inserted through vocal cords under direct vision,  positive ETCO2 and breath sounds checked- equal and bilateral Secured at: 20 cm Tube secured with: Tape Dental Injury: Teeth and Oropharynx as per pre-operative assessment

## 2019-01-28 NOTE — Op Note (Signed)
Operative Note   01/28/2019  PRE-OP DIAGNOSIS: Desire for permanent sterilization. History of abnormal pap smear   POST-OP DIAGNOSIS: Same  SURGEON: Surgeon(s) and Role:    * Aletha Halim, MD - Primary  ASSISTANT: None  ANESTHESIA: General and local  PROCEDURE: LAPAROSCOPIC TUBAL LIGATION VIA FILSHIE CLIPS AND PAP SMEAR   ESTIMATED BLOOD LOSS: 68mL  DRAINS: I/O cath for 36mL UOP    TOTAL IV FLUIDS: per anesthesia note  SPECIMENS: pap smear to pathology   VTE PROPHYLAXIS: SCDs to the bilateral lower extremities  ANTIBIOTICS: not indicated  COMPLICATIONS: none  DISPOSITION: PACU - hemodynamically stable.  CONDITION: stable  FINDINGS: Exam under anesthesia revealed an anteverted uterus approximately 8 week size, normal shape, and no adnexal masses. Normal EGBUS, vaginal vault and cervix. Laparoscopic survey of the abdomen revealed a grossly normal uterus, tubes, ovaries, liver, and stomach edge; no intra-abdominal adhesions were noted  PROCEDURE IN DETAIL: The patient was taken to the OR where anesthesia was administed. The patient was positioned in dorsal lithotomy in the [place]. The patient was then examined under anesthesia with the above noted findings. Graves speculum inserted and pap smear done and speculum removed. The patient was prepped and draped in the normal sterile fashion and foley catheter was placed. A Graves speculum was placed in the vagina and the anterior lip of the cervix was grasped with a single toothed tenaculum.  A Hulka uterine manipulator was then inserted in the uterus and uterine mobility was found to be satisfactory; the speculum and tenaculum were then removed.  After changing gloves, attention was turned to the patient's abdomen where a 10 mm skin incision was made in the umbilical fold, after injection of local anesthesia. The open technique was used to enter the abdomen, tagging the fascia with 0-vicryl along the way. The 49mm port was  then placed, pneumoperitoneum obtained, and the operative laparoscope was introduced into the abdomen with the above noted findings, after inspection of the entry site and then placing the patient in Trendelenburg. Next a blunt probe was inserted, via the operative laparoscope, and the bilateral tubes were traced out to their respective fimbriae, with the above noted findings. Next, a Filshie clip was loaded and the placed across the tube approximately one third of the way down the left tube from the uterus. The placement was inspected and complete occlusion of the tube, with the bottom jaw on the outside of the mesosalpinx noted. This was then done on the right tube.  All instruments and ports were then removed from the abdomen. The fascia at the umbilical incision was tied together, and the skin was closed with 4-0 monocryl and dermabond The Hulka was removed with no bleeding noted from the cervix and all other instrumentation was removed from the vagina.  The foley catheter was removed. The patient tolerated the procedure well. All counts were correct x 2. The patient was transferred to the recovery room awake, alert and breathing independently.   Durene Romans MD Attending Center for [employer] [facility])

## 2019-01-28 NOTE — Anesthesia Postprocedure Evaluation (Signed)
Anesthesia Post Note  Patient: Lori Shea  Procedure(s) Performed: LAPAROSCOPIC TUBAL LIGATION AND PAP SMEAR (Bilateral Abdomen)     Patient location during evaluation: Phase II Anesthesia Type: General Level of consciousness: awake and alert, oriented and patient cooperative Pain management: pain level controlled Vital Signs Assessment: post-procedure vital signs reviewed and stable Respiratory status: spontaneous breathing, nonlabored ventilation and respiratory function stable Cardiovascular status: blood pressure returned to baseline and stable Postop Assessment: no apparent nausea or vomiting Anesthetic complications: no    Last Vitals:  Vitals:   01/28/19 1015 01/28/19 1030  BP: 131/87 (!) 137/94  Pulse: 90 85  Resp: 16 10  Temp:    SpO2: 93% 97%    Last Pain:  Vitals:   01/28/19 1030  TempSrc:   PainSc: [place]

## 2019-01-28 NOTE — H&P (Signed)
Obstetrics & Gynecology Surgical H&P   Date of Surgery: 01/28/2019   Primary OBGYN: [facility] Primary Care Provider: Lanae Boast  Reason for Admission: scheduled surgery  History of Present Illness: Ms. Farver is a 43 y.o. [phone] (Patient's last menstrual period was 12/23/2018 (approximate).), with the above CC. PMHx is significant for BMI 40s, DM2, HTN, asthma, h/o LSIL pap  ROS: A 12-point review of systems was performed and negative, except as stated in the above HPI.  OBGYN History: As per HPI. OB History  Gravida Para Term Preterm AB Living  4 1 1   3 1   SAB TAB Ectopic Multiple Live Births  3            # Outcome Date GA Lbr Len/2nd Weight Sex Delivery Anes PTL Lv  4 SAB           3 Term           2 SAB           1 SAB              Past Medical History: Past Medical History:  Diagnosis Date  . Allergy    Foods:  mango, honey, corn.  Allergic to "everything tested"  15 years ago in [place]  . Asthma age 72 yo   Triggers:  cold weather, exercise, allergens, anxiety, URIs  . Cholelithiasis age 27 yo   Asymptomatic  . Diabetes mellitus 2012   Was diagnosed 2 years earlier with PCOS  . Elevated liver enzymes 09/13/2016  . Hypertension 2015  . Kidney stones   . Migraines 2009  . Morbid obesity ([place])   . PCOS (polycystic ovarian syndrome) 2010    Past Surgical History: Past Surgical History:  Procedure Laterality Date  . COLPOSCOPY N/A   . TONSILLECTOMY AND ADENOIDECTOMY Bilateral 12/23/2018   Procedure: TONSILLECTOMY AND ADENOIDECTOMY;  Surgeon: Leta Baptist, MD;  Location: [facility];  Service: ENT;  Laterality: Bilateral;    Family History:  Family History  Problem Relation Age of Onset  . Diabetes Father   . Hyperlipidemia Father   . Hypertension Father   . Heart disease Father   . Kidney disease Father        Developed after 2nd CABG  . Diabetes Paternal Grandfather   . Heart disease Paternal Grandfather   .  Hyperlipidemia Paternal Grandfather   . Hypertension Paternal Grandfather   . Kidney disease Paternal Grandfather     Social History:  Social History   Socioeconomic History  . Marital status: Divorced    Spouse name: Not on file  . Number of children: 1  . Years of education: some comm. college  . Highest education level: Not on file  Occupational History  . Occupation: CNA    Comment: Veterinary surgeon  . Financial resource strain: Not on file  . Food insecurity    Worry: Not on file    Inability: Not on file  . Transportation needs    Medical: Not on file    Non-medical: Not on file  Tobacco Use  . Smoking status: Never Smoker  . Smokeless tobacco: Never Used  Substance and Sexual Activity  . Alcohol use: No  . Drug use: No  . Sexual activity: Yes    Birth control/protection: None    Comment: Sprintec  Lifestyle  . Physical activity    Days per week: Not on file    Minutes per session: Not  on file  . Stress: Not on file  Relationships  . Social Herbalist on phone: Not on file    Gets together: Not on file    Attends religious service: Not on file    Active member of club or organization: Not on file    Attends meetings of clubs or organizations: Not on file    Relationship status: Not on file  . Intimate partner violence    Fear of current or ex partner: Not on file    Emotionally abused: Not on file    Physically abused: Not on file    Forced sexual activity: Not on file  Other Topics Concern  . Not on file  Social History Narrative   Originally from [place], [place] to [place] in [place].   In [place] in [place] at home with daughter on [address].     Allergy: Allergies  Allergen Reactions  . Corn-Containing Products Hives    "really bad asthma attacks"  . Honey Shortness Of Breath  . 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 Victoza [Liraglutide] Nausea And Vomiting    Current  Outpatient Medications: Medications Prior to Admission  Medication Sig Dispense Refill Last Dose  . ADVAIR DISKUS 100-50 MCG/DOSE AEPB INHALE 1 PUFF INTO THE LUNGS EVERY 12 HOURS. 3 each 0 01/20/2019 at Unknown time  . albuterol (PROVENTIL) (2.5 MG/3ML) 0.083% nebulizer solution Take 3 mLs (2.5 mg total) by nebulization every 6 (six) hours as needed for wheezing or shortness of breath. 75 mL 0 Past Week at Unknown time  . amitriptyline (ELAVIL) 25 MG tablet Take 1 tablet (25 mg total) by mouth at bedtime. 30 tablet 5 01/20/2019 at Unknown time  . cetirizine (ZYRTEC) 10 MG tablet Take 1 tablet (10 mg total) by mouth daily. 30 tablet 11 01/21/2019 at Unknown time  . DEXILANT 30 MG capsule TAKE 1 CAPSULE (30MG  TOTAL) BY MOUTH DAILY. TAKE ON AN EMPTY STOMACH. 90 capsule 0 01/21/2019 at Unknown time  . EASY COMFORT PEN NEEDLES 32G X 4 MM MISC use as directed test 2 times daily  3 01/21/2019 at Unknown time  . EDARBYCLOR 40-25 MG TABS TAKE 1 TABLET BY MOUTH DAILY. 60 tablet 0 01/21/2019 at Unknown time  . insulin aspart (NOVOLOG FLEXPEN) 100 UNIT/ML FlexPen Inject 10-14 Units into the skin 3 (three) times daily before meals. 30 mL 5 01/21/2019 at Unknown time  . Insulin Glargine (LANTUS SOLOSTAR) 100 UNIT/ML Solostar Pen Inject 60 units at bedtime. 5 pen 11 01/20/2019 at Unknown time  . Insulin Pen Needle (PEN NEEDLES) 32G X 5 MM MISC 100 each by Does not apply route 2 (two) times daily. 100 each 3 01/21/2019 at Unknown time  . Magnesium Citrate 100 MG TABS Take 100 mg by mouth every evening.   01/21/2019 at Unknown time  . meloxicam (MOBIC) 15 MG tablet Take 1 tablet (15 mg total) by mouth daily. 30 tablet 3 Past Month at Unknown time  . metFORMIN (GLUCOPHAGE) 500 MG tablet Take 1 tablet (500 mg total) by mouth 2 (two) times daily with a meal. 60 tablet 5 01/20/2019 at Unknown time  . montelukast (SINGULAIR) 10 MG tablet Take 1 tablet (10 mg total) by mouth daily. 30 tablet 5 01/20/2019 at Unknown time  . Multiple  Vitamins-Minerals (MULTIVITAMIN ADULT PO) Take 1 tablet by mouth daily.   01/21/2019 at Unknown time  . norethindrone (ORTHO MICRONOR) 0.35  MG tablet Take 1 tablet (0.35 mg total) by mouth daily. 3 Package 3 01/20/2019 at Unknown time  . Psyllium (FIBER) 0.52 g CAPS Take 1 capsule by mouth daily.   01/20/2019 at Unknown time  . VENTOLIN HFA 108 (90 Base) MCG/ACT inhaler INHALE 2 PUFFS INTO THE LUNGS EVERY 4 HOURS AS NEEDED FOR WHEEZING ORSHORTNESS OF BREATH. 3 each 0 Past Month at Unknown time  . ibuprofen (ADVIL,MOTRIN) 600 MG tablet Take 1 tablet (600 mg total) by mouth every 6 (six) hours as needed. 30 tablet 0 More than a month at Unknown time  . rizatriptan (MAXALT-MLT) 10 MG disintegrating tablet Take 1 tablet (10 mg total) by mouth as needed for migraine. May repeat in 2 hours if needed.  Max 30 mg/24 h 12 tablet 6 More than a month at Unknown time     Hospital Medications: No current facility-administered medications for this encounter.      Physical Exam: VS: pending  Body mass index is 42.56 kg/m. General appearance: Well nourished, well developed female in no acute distress.  Cardiovascular: S1, S2 normal, no murmur, rub or gallop, regular rate and rhythm Respiratory:  Clear to auscultation bilateral. Normal respiratory effort Abdomen: obese, nttp, nd, no masses Neuro/Psych:  Normal mood and affect.  Skin:  Warm and dry.  Extremities: no clubbing, cyanosis, or edema.    Laboratory: Pending: UPT, CBG  Imaging:  none  Assessment: Ms. Zientara is a 43 y.o. [phone] (Patient's last menstrual period was 12/23/2018 (approximate).) here for scheduled surgery  Plan: D/w Ms. Macartney and she is amenable to l/s BTL. Will do pap if supplies here. Can proceed when OR is ready  Durene Romans MD Attending Center for [facility])

## 2019-01-28 NOTE — Anesthesia Preprocedure Evaluation (Addendum)
Anesthesia Evaluation  Patient identified by MRN, date of birth, ID band Patient awake    Reviewed: Allergy & Precautions, NPO status , Patient's Chart, lab work & pertinent test results  Airway Mallampati: I  TM Distance: >3 FB Neck ROM: Full    Dental  (+) Teeth Intact, Dental Advisory Given   Pulmonary asthma ,  Last inhaler use few weeks ago   Pulmonary exam normal breath sounds clear to auscultation       Cardiovascular hypertension, Pt. on medications Normal cardiovascular exam Rhythm:Regular Rate:Normal     Neuro/Psych  Headaches, negative psych ROS   GI/Hepatic Neg liver ROS, GERD  Medicated,  Endo/Other  diabetes, Type 2, Oral Hypoglycemic Agents, Insulin DependentMorbid obesityBMI 43  Renal/GU Hx nephrolithiasis     Musculoskeletal negative musculoskeletal ROS (+)   Abdominal Normal abdominal exam  (+) + obese,   Peds  Hematology negative hematology ROS (+)   Anesthesia Other Findings Prefers no zofran- headaches  Reproductive/Obstetrics PCOS                           Anesthesia Physical  Anesthesia Plan  ASA: III  Anesthesia Plan: General   Post-op Pain Management:    Induction: Intravenous  PONV Risk Score and Plan: 4 or greater and Scopolamine patch - Pre-op, Midazolam, Dexamethasone, Ondansetron, Treatment may vary due to age or medical condition and Metaclopromide  Airway Management Planned: Oral ETT  Additional Equipment: None  Intra-op Plan:   Post-operative Plan: Extubation in OR  Informed Consent: I have reviewed the patients History and Physical, chart, labs and discussed the procedure including the risks, benefits and alternatives for the proposed anesthesia with the patient or authorized representative who has indicated his/her understanding and acceptance.     Dental advisory given  Plan Discussed with: CRNA  Anesthesia Plan Comments:          Anesthesia Quick Evaluation

## 2019-01-28 NOTE — Discharge Instructions (Addendum)
No Ibuprofen until 4:00pm if needed  Laparoscopic Surgery Discharge Instructions  I You have just undergone a laparoscopic surgery.  The following list should answer your most common questions.  Although we will discuss your surgery and post-operative instructions with you prior to your discharge, this list will serve as a reminder if you fail to recall the details of what we discussed.  We will discuss your surgery once again in detail at your post-op visit in two to four weeks. If you havent already done so, please call to make your appointment as soon as possible.  How you will feel: Although you have just undergone a major surgery, your recovery will be significantly shorter since the surgery was performed through much smaller incisions than the traditional approach.  You should feel slightly better each day.  If you suddenly feel much worse than the prior day, please call the clinic.  Its important during the early part of your recovery that you maintain some activity.  Walking is encouraged.  You will quicken your recovery by continued activity.  Incision:  Your incisions will be closed with dissolvable stitches or surgical adhesive (glue).  There may be Band-aids and/or Steri-strips covering your incisions.  If there is no drainage from the incisions you may remove the Band-aids in one to two days.  You may notice some minor bruising at the incision sites.  This is common and will resolve within several days.  Please inform us if the redness at the edges of your incision appears to be spreading.  If the skin around your incision becomes warm to the touch, or if you notice a pus-like drainage, please call the office.  Stairs/Driving/Activities: You may climb stairs if necessary.  If youve had general anesthesia, do not drive a car the rest of the day today.  You may begin light housework when you feel up to it, but avoid heavy lifting (more than 15-20lbs) or pushing until cleared for these  activities by your physician.  Hygiene:  Do not soak your incisions.  Showers are acceptable but you may not take a bath or swim in a pool.  Cleanse your incisions daily with soap and water.  Medications:  Please resume taking any medications that you were taking prior to the surgery.  If we have prescribed any new medications for you, please take them as directed.  Constipation:  It is fairly common to experience some difficulty in moving your bowels following major surgery.  Being active will help to reduce this likelihood. A diet rich in fiber and plenty of liquids is desirable.  If you do become constipated, a mild laxative such as Miralax, Milk of Magnesia, or Metamucil, or a stool softener such as Colace, is recommended.  General Instructions: If you develop a fever of 100.5 degrees or higher, please call the office number(s) below for physician on call.    Post Anesthesia Home Care Instructions  Activity: Get plenty of rest for the remainder of the day. A responsible individual must stay with you for 24 hours following the procedure.  For the next 24 hours, DO NOT: -Drive a car -Paediatric nurse -Drink alcoholic beverages -Take any medication unless instructed by your physician -Make any legal decisions or sign important papers.  Meals: Start with liquid foods such as gelatin or soup. Progress to regular foods as tolerated. Avoid greasy, spicy, heavy foods. If nausea and/or vomiting occur, drink only clear liquids until the nausea and/or vomiting subsides. Call your physician if vomiting  continues.  Special Instructions/Symptoms: Your throat may feel dry or sore from the anesthesia or the breathing tube placed in your throat during surgery. If this causes discomfort, gargle with warm salt water. The discomfort should disappear within 24 hours.  If you had a scopolamine patch placed behind your ear for the management of post- operative nausea and/or vomiting:  1. The medication  in the patch is effective for 72 hours, after which it should be removed.  Wrap patch in a tissue and discard in the trash. Wash hands thoroughly with soap and water. 2. You may remove the patch earlier than 72 hours if you experience unpleasant side effects which may include dry mouth, dizziness or visual disturbances. 3. Avoid touching the patch. Wash your hands with soap and water after contact with the patch.

## 2019-01-28 NOTE — Transfer of Care (Signed)
Immediate Anesthesia Transfer of Care Note  Patient: Lori Shea  Procedure(s) Performed: LAPAROSCOPIC TUBAL LIGATION AND PAP SMEAR (Bilateral Abdomen)  Patient Location: PACU  Anesthesia Type:General  Level of Consciousness: awake, alert , oriented and patient cooperative  Airway & Oxygen Therapy: Patient Spontanous Breathing and Patient connected to face mask oxygen  Post-op Assessment: Report given to RN, Post -op Vital signs reviewed and stable and Patient moving all extremities  Post vital signs: Reviewed and stable  Last Vitals:  Vitals Value Taken Time  BP 129/93 01/28/19 0936  Temp    Pulse 94 01/28/19 0937  Resp 15 01/28/19 0937  SpO2 95 % 01/28/19 0937  Vitals shown include unvalidated device data.  Last Pain:  Vitals:   01/28/19 0725  TempSrc: Oral  PainSc: 0-No pain      Patients Stated Pain Goal: 4 ([id number])  Complications: No apparent anesthesia complications

## 2019-01-29 ENCOUNTER — Encounter (HOSPITAL_BASED_OUTPATIENT_CLINIC_OR_DEPARTMENT_OTHER): Payer: Self-pay | Admitting: Obstetrics and Gynecology

## 2019-02-01 ENCOUNTER — Encounter (HOSPITAL_COMMUNITY): Payer: Self-pay | Admitting: *Deleted

## 2019-02-01 ENCOUNTER — Other Ambulatory Visit: Payer: Self-pay

## 2019-02-01 ENCOUNTER — Emergency Department (HOSPITAL_COMMUNITY): Payer: Medicaid Other

## 2019-02-01 ENCOUNTER — Emergency Department (HOSPITAL_COMMUNITY)
Admission: EM | Admit: 2019-02-01 | Discharge: 2019-02-02 | Disposition: A | Discharge status: Home / Self Care | Payer: Medicaid Other | Attending: Emergency Medicine | Admitting: Emergency Medicine

## 2019-02-01 DIAGNOSIS — Z794 Long term (current) use of insulin: Secondary | ICD-10-CM | POA: Insufficient documentation

## 2019-02-01 DIAGNOSIS — I10 Essential (primary) hypertension: Secondary | ICD-10-CM | POA: Insufficient documentation

## 2019-02-01 DIAGNOSIS — E119 Type 2 diabetes mellitus without complications: Secondary | ICD-10-CM | POA: Diagnosis not present

## 2019-02-01 DIAGNOSIS — J45909 Unspecified asthma, uncomplicated: Secondary | ICD-10-CM | POA: Diagnosis not present

## 2019-02-01 DIAGNOSIS — Z79899 Other long term (current) drug therapy: Secondary | ICD-10-CM | POA: Insufficient documentation

## 2019-02-01 DIAGNOSIS — R110 Nausea: Secondary | ICD-10-CM | POA: Diagnosis present

## 2019-02-01 DIAGNOSIS — R1030 Lower abdominal pain, unspecified: Secondary | ICD-10-CM | POA: Insufficient documentation

## 2019-02-01 DIAGNOSIS — G8918 Other acute postprocedural pain: Secondary | ICD-10-CM | POA: Diagnosis not present

## 2019-02-01 LAB — COMPREHENSIVE METABOLIC PANEL
ALT: 29 U/L (ref 0–44)
AST: 27 U/L (ref 15–41)
Albumin: 3.3 g/dL — ABNORMAL LOW (ref 3.5–5.0)
Alkaline Phosphatase: 97 U/L (ref 38–126)
Anion gap: 10 (ref 5–15)
BUN: 11 mg/dL (ref 6–20)
CO2: 23 mmol/L (ref 22–32)
Calcium: 8.9 mg/dL (ref 8.9–10.3)
Chloride: 102 mmol/L (ref 98–111)
Creatinine, Ser: 0.75 mg/dL (ref 0.44–1.00)
GFR calc Af Amer: >60 mL/min (ref >60)
GFR calc non Af Amer: >60 mL/min (ref >60)
Glucose, Bld: 394 mg/dL — ABNORMAL HIGH (ref 70–99)
Potassium: 3.6 mmol/L (ref 3.5–5.1)
Sodium: 135 mmol/L (ref 135–145)
Total Bilirubin: 0.3 mg/dL (ref 0.3–1.2)
Total Protein: 6.4 g/dL — ABNORMAL LOW (ref 6.5–8.1)

## 2019-02-01 LAB — URINALYSIS, ROUTINE W REFLEX MICROSCOPIC
Bacteria, UA: NONE SEEN
Bilirubin Urine: NEGATIVE
Glucose, UA: >=500 mg/dL — AB
Hgb urine dipstick: NEGATIVE
Ketones, ur: NEGATIVE mg/dL
Leukocytes,Ua: NEGATIVE
Nitrite: NEGATIVE
Protein, ur: NEGATIVE mg/dL
Specific Gravity, Urine: 1.028 (ref 1.005–1.030)
pH: 5 (ref 5.0–8.0)

## 2019-02-01 LAB — PREGNANCY, URINE: Preg Test, Ur: NEGATIVE

## 2019-02-01 LAB — CBC
HCT: 38.5 % (ref 36.0–46.0)
Hemoglobin: 13.4 g/dL (ref 12.0–15.0)
MCH: 30 pg (ref 26.0–34.0)
MCHC: 34.8 g/dL (ref 30.0–36.0)
MCV: 86.1 fL (ref 80.0–100.0)
Platelets: 366 10*3/uL (ref 150–400)
RBC: 4.47 MIL/uL (ref 3.87–5.11)
RDW: 13.2 % (ref 11.5–15.5)
WBC: 11.5 10*3/uL — ABNORMAL HIGH (ref 4.0–10.5)
nRBC: 0 % (ref 0.0–0.2)

## 2019-02-01 MED ORDER — MORPHINE SULFATE (PF) 4 MG/ML IV SOLN
4.0000 mg | Freq: Once | INTRAVENOUS | Status: AC
  Administered 2019-02-01: 23:00:00 4 mg via INTRAVENOUS
  Filled 2019-02-01: qty 1

## 2019-02-01 MED ORDER — SODIUM CHLORIDE 0.9% FLUSH: 3.0000 mL | Freq: Once | INTRAVENOUS | Status: DC

## 2019-02-01 MED ORDER — SODIUM CHLORIDE 0.9 % IV BOLUS
1000.0000 mL | Freq: Once | INTRAVENOUS | Status: AC
  Administered 2019-02-01: 1000 mL via INTRAVENOUS

## 2019-02-01 MED ORDER — IOHEXOL 300 MG/ML  SOLN
100.0000 mL | Freq: Once | INTRAMUSCULAR | Status: AC | PRN
  Administered 2019-02-01: 100 mL via INTRAVENOUS

## 2019-02-01 MED ORDER — PROMETHAZINE HCL 25 MG/ML IJ SOLN
25.0000 mg | Freq: Once | INTRAMUSCULAR | Status: AC
  Administered 2019-02-01: 23:00:00 25 mg via INTRAVENOUS
  Filled 2019-02-01: qty 1

## 2019-02-01 NOTE — ED Provider Notes (Signed)
[facility] EMERGENCY DEPARTMENT Provider Note   CSN: [id number] Arrival date & time: 02/01/19  2027     History   Chief Complaint Chief Complaint  Patient presents with   Nausea    HPI Lori Shea is a 43 y.o. female.     Patient presents to the emergency department with a chief complaint of nausea and abdominal pain.  He is status post 3 days tubal ligation.  She denies fevers or chills, but states that she might of had some discharge from her umbilical incision.  She reports increased lower abdominal pain that is worsened with palpation.  Denies any dysuria or flank pain.  Denies any other associated symptoms.  Her OB/GYN, Dr. Ilda Basset, advised patient to come to the emergency department for evaluation.  The history is provided by the patient. No language interpreter was used.    Past Medical History:  Diagnosis Date   Allergy    Foods:  mango, honey, corn.  Allergic to "everything tested"  15 years ago in [place] age 22 yo   Triggers:  cold weather, exercise, allergens, anxiety, URIs   Cholelithiasis age 22 yo   Asymptomatic   Diabetes mellitus 2012   Was diagnosed 2 years earlier with PCOS   Elevated liver enzymes 09/13/2016   History of kidney stones    Hypertension 2015   Kidney stones    family history of renal failure   Migraines 2009   Morbid obesity ([place])    PCOS (polycystic ovarian syndrome) 2010    Patient Active Problem List   Diagnosis Date Noted   LGSIL on Pap smear of cervix 07/31/2017   Alopecia of scalp 05/09/2017   Pain in left toe(s) 12/27/2016   Closed avulsion fracture of lateral malleolus of right fibula 12/27/2016   Morbid obesity ([place]) 09/13/2016   Elevated liver enzymes 09/13/2016   Allergy    Asthma    Hypertension 04/23/2013   Type 2 diabetes mellitus with hyperglycemia, with long-term current use of insulin ([place]) 04/23/2010   PCOS (polycystic ovarian syndrome) 04/23/2008    Migraines 04/24/2007    Past Surgical History:  Procedure Laterality Date   COLPOSCOPY N/A    LAPAROSCOPIC TUBAL LIGATION Bilateral 01/28/2019   Procedure: LAPAROSCOPIC TUBAL LIGATION AND PAP SMEAR;  Surgeon: Aletha Halim, MD;  Location: [place];  Service: Gynecology;  Laterality: Bilateral;   TONSILLECTOMY AND ADENOIDECTOMY Bilateral 12/23/2018   Procedure: TONSILLECTOMY AND ADENOIDECTOMY;  Surgeon: Leta Baptist, MD;  Location: [place];  Service: ENT;  Laterality: Bilateral;     OB History    Gravida  4   Para  1   Term  1   Preterm      AB  3   Living  1     SAB  3   TAB      Ectopic      Multiple      Live Births               Home Medications    Prior to Admission medications   Medication Sig Start Date End Date Taking? Authorizing Provider  ADVAIR DISKUS 100-50 MCG/DOSE AEPB INHALE 1 PUFF INTO THE LUNGS EVERY 12 HOURS. 11/12/18   Lanae Boast, FNP  albuterol (PROVENTIL) (2.5 MG/3ML) 0.083% nebulizer solution Take 3 mLs (2.5 mg total) by nebulization every 6 (six) hours as needed for wheezing or shortness of breath. 08/19/17   Dorena Dew, FNP  amitriptyline (  ELAVIL) 25 MG tablet Take 1 tablet (25 mg total) by mouth at bedtime. 08/18/18   Lanae Boast, FNP  cetirizine (ZYRTEC) 10 MG tablet Take 1 tablet (10 mg total) by mouth daily. 04/08/17   Dorena Dew, FNP  DEXILANT 30 MG capsule TAKE 1 CAPSULE (30MG  TOTAL) BY MOUTH DAILY. TAKE ON AN EMPTY STOMACH. 01/19/19   Tresa Garter, MD  EASY COMFORT PEN NEEDLES 32G X 4 MM MISC use as directed test 2 times daily 05/14/17   [provider]  EDARBYCLOR 40-25 MG TABS TAKE 1 TABLET BY MOUTH DAILY. 12/16/18   Lanae Boast, FNP  ibuprofen (ADVIL,MOTRIN) 600 MG tablet Take 1 tablet (600 mg total) by mouth every 6 (six) hours as needed. 03/15/18   Charlesetta Shanks, MD  insulin aspart (NOVOLOG FLEXPEN) 100 UNIT/ML FlexPen Inject 10-14 Units into the skin 3  (three) times daily before meals. 05/07/18   Lanae Boast, FNP  Insulin Glargine (LANTUS SOLOSTAR) 100 UNIT/ML Solostar Pen Inject 60 units at bedtime. 06/05/18   Philemon Kingdom, MD  Insulin Pen Needle (PEN NEEDLES) 32G X 5 MM MISC 100 each by Does not apply route 2 (two) times daily. 05/14/17   Dorena Dew, FNP  Magnesium Citrate 100 MG TABS Take 100 mg by mouth every evening.    [provider]  meloxicam (MOBIC) 15 MG tablet Take 1 tablet (15 mg total) by mouth daily. 12/18/18   Lanae Boast, FNP  metFORMIN (GLUCOPHAGE) 500 MG tablet Take 1 tablet (500 mg total) by mouth 2 (two) times daily with a meal. 11/22/17 01/21/19  Lanae Boast, FNP  montelukast (SINGULAIR) 10 MG tablet Take 1 tablet (10 mg total) by mouth daily. 11/22/17   Lanae Boast, FNP  Multiple Vitamins-Minerals (MULTIVITAMIN ADULT PO) Take 1 tablet by mouth daily.    [provider]  oxyCODONE-acetaminophen (PERCOCET/ROXICET) 5-325 MG tablet Take 1 tablet by mouth every 6 (six) hours as needed. 01/28/19   Aletha Halim, MD  polyethylene glycol (MIRALAX / GLYCOLAX) 17 g packet Take 17 g by mouth daily for 14 days. 01/28/19 02/11/19  Aletha Halim, MD  Psyllium (FIBER) 0.52 g CAPS Take 1 capsule by mouth daily.    [provider]  rizatriptan (MAXALT-MLT) 10 MG disintegrating tablet Take 1 tablet (10 mg total) by mouth as needed for migraine. May repeat in 2 hours if needed.  Max 30 mg/24 h 09/24/18   Lanae Boast, FNP  VENTOLIN HFA 108 (90 Base) MCG/ACT inhaler INHALE 2 PUFFS INTO THE LUNGS EVERY 4 HOURS AS NEEDED FOR WHEEZING ORSHORTNESS OF BREATH. 06/25/18   Lanae Boast, FNP    Family History Family History  Problem Relation Age of Onset   Diabetes Father    Hyperlipidemia Father    Hypertension Father    Heart disease Father    Kidney disease Father        Developed after 2nd CABG   Diabetes Paternal Grandfather    Heart disease Paternal Grandfather    Hyperlipidemia  Paternal Grandfather    Hypertension Paternal Grandfather    Kidney disease Paternal Grandfather     Social History Social History   Tobacco Use   Smoking status: Never Smoker   Smokeless tobacco: Never Used  Substance Use Topics   Alcohol use: No   Drug use: No     Allergies   Corn-containing products, Honey, Mango flavor, and Victoza [liraglutide]   Review of Systems Review of Systems  All other systems reviewed and are negative.  Physical Exam Updated Vital Signs BP (!) 129/91    Pulse 81    Temp 99 F (37.2 C) (Oral)    Resp (!) 22    LMP 01/06/2019    SpO2 96%   Physical Exam Vitals signs and nursing note reviewed.  Constitutional:      General: She is not in acute distress.    Appearance: She is well-developed.  HENT:     Head: Normocephalic and atraumatic.  Eyes:     Conjunctiva/sclera: Conjunctivae normal.  Neck:     Musculoskeletal: Neck supple.  Cardiovascular:     Rate and Rhythm: Normal rate and regular rhythm.     Heart sounds: No murmur.  Pulmonary:     Effort: Pulmonary effort is normal. No respiratory distress.     Breath sounds: Normal breath sounds.  Abdominal:     Palpations: Abdomen is soft.     Tenderness: There is abdominal tenderness.     Comments: Lower abdominal tenderness Umbilical incision is without signs of infection or discharge  Musculoskeletal: Normal range of motion.  Skin:    General: Skin is warm and dry.  Neurological:     Mental Status: She is alert and oriented to person, place, and time.  Psychiatric:        Mood and Affect: Mood normal.        Behavior: Behavior normal.      ED Treatments / Results  Labs (all labs ordered are listed, but only abnormal results are displayed) Labs Reviewed  COMPREHENSIVE METABOLIC PANEL - Abnormal; Notable for the following components:      Result Value   Glucose, Bld 394 (*)    Total Protein 6.4 (*)    Albumin 3.3 (*)    All other components within normal limits    CBC - Abnormal; Notable for the following components:   WBC 11.5 (*)    All other components within normal limits  URINALYSIS, ROUTINE W REFLEX MICROSCOPIC - Abnormal; Notable for the following components:   Glucose, UA >=500 (*)    All other components within normal limits  PREGNANCY, URINE    EKG None  Radiology No results found.  Procedures Procedures (including critical care time)  Medications Ordered in ED Medications  sodium chloride flush (NS) 0.9 % injection 3 mL (has no administration in time range)  morphine 4 MG/ML injection 4 mg (has no administration in time range)  promethazine (PHENERGAN) injection 25 mg (has no administration in time range)     Initial Impression / Assessment and Plan / ED Course  I have reviewed the triage vital signs and the nursing notes.  Pertinent labs & imaging results that were available during my care of the patient were reviewed by me and considered in my medical decision making (see chart for details).        Patient is status post 3 days tubal ligation.  Complaining of worsening abdominal pain.  No notable discharge on exam.  Afebrile.  Labs notable for white count of 11.5.  Glucose is 394.  Will give fluids.  Will treat pain and nausea.  Will check CT given recent surgical history.  CT scan negative for postoperative complication.  Vital signs are stable.  Patient reassured.  Return precautions given.  Pain likely secondary to the surgery itself, and normal in postoperative setting.  She states she has only been taking 1 Percocet each evening.  I will send some more Percocet to her pharmacy.  Final Clinical Impressions(s) /  ED Diagnoses   Final diagnoses:  Post-operative pain    ED Discharge Orders         Ordered    oxyCODONE-acetaminophen (PERCOCET) 5-325 MG tablet  Every 6 hours PRN     02/02/19 0047           Montine Circle, PA-C 02/02/19 0049    Davonna Belling, MD 02/04/19 [phone]

## 2019-02-01 NOTE — ED Notes (Signed)
I see dry drainage, it's hard to tell what color, from pt's navel area.

## 2019-02-01 NOTE — ED Triage Notes (Signed)
Pt arrives with c/o perisitant nausea since last night. She is reporting she had a laparoscopic tubal ligation this week. Denies fevers, she said she may have had some discharge out of the umbilical wound.

## 2019-02-02 MED ORDER — OXYCODONE-ACETAMINOPHEN 5-325 MG PO TABS: 1.0000 | ORAL_TABLET | Freq: Four times a day (QID) | ORAL | 0 refills | Status: DC | PRN

## 2019-02-02 MED ORDER — OXYCODONE-ACETAMINOPHEN 5-325 MG PO TABS
2.0000 | ORAL_TABLET | Freq: Once | ORAL | Status: AC
  Administered 2019-02-02: 01:00:00 2 via ORAL
  Filled 2019-02-02: qty 2

## 2019-02-02 NOTE — Discharge Instructions (Addendum)
Your CT scan showed no evidence of postoperative complication or infection.  Your laboratory work-up was notable for mildly elevated white blood cell count at 11.5, however this is nonspecific.  In the setting of reassuring CT scan, it is believe that your safe for discharged home with outpatient follow-up.  I believe the pain that you are experiencing is likely normal postoperative pain, and you may simply need better pain control.  I have sent more pain pills to your pharmacy.  If your symptoms change or worsen, please contact your doctor or return to the emergency department.

## 2019-02-11 LAB — CYTOLOGY - PAP
Comment: NEGATIVE
Diagnosis: UNDETERMINED — AB
High risk HPV: POSITIVE — AB

## 2019-02-13 ENCOUNTER — Encounter: Payer: Self-pay | Admitting: Obstetrics and Gynecology

## 2019-02-13 ENCOUNTER — Ambulatory Visit (INDEPENDENT_AMBULATORY_CARE_PROVIDER_SITE_OTHER): Payer: Self-pay | Admitting: Obstetrics and Gynecology

## 2019-02-13 ENCOUNTER — Other Ambulatory Visit: Payer: Self-pay

## 2019-02-13 VITALS — BP 124/78 | HR 81 | Ht 65.0 in | Wt 260.0 lb

## 2019-02-13 DIAGNOSIS — Z09 Encounter for follow-up examination after completed treatment for conditions other than malignant neoplasm: Secondary | ICD-10-CM

## 2019-02-13 DIAGNOSIS — N879 Dysplasia of cervix uteri, unspecified: Secondary | ICD-10-CM

## 2019-02-13 DIAGNOSIS — Z9889 Other specified postprocedural states: Secondary | ICD-10-CM

## 2019-02-13 NOTE — Progress Notes (Signed)
Obstetrics and Gynecology Visit Return Patient Evaluation  Appointment Date: 02/13/2019  Primary Care Provider: [place] [place] [facility]: Center for [facility]  Chief Complaint: scheduled post op check  History of Present Illness:  Lori Shea is a 43 y.o. s/p 10/7 l/s BTL; she was discharged from the pacu and seen in the ed on 10/11 for pain and had neg CT scan.  Interval History: Since that time, she states that she asymptomatic and has no pain or issues.   Review of Systems: as noted in the History of Present Illness.   Patient Active Problem List   Diagnosis Date Noted  . LGSIL on Pap smear of cervix 07/31/2017  . Alopecia of scalp 05/09/2017  . Pain in left toe(s) 12/27/2016  . Closed avulsion fracture of lateral malleolus of right fibula 12/27/2016  . Morbid obesity ([place]) 09/13/2016  . Elevated liver enzymes 09/13/2016  . Allergy   . Asthma   . Hypertension 04/23/2013  . Type 2 diabetes mellitus with hyperglycemia, with long-term current use of insulin ([place]) 04/23/2010  . PCOS (polycystic ovarian syndrome) 04/23/2008  . Migraines 04/24/2007   Medications:  Lori Shea. Lori Shea "Juliann Pulse" had no medications administered during this visit. Current Outpatient Medications  Medication Sig Dispense Refill  . ADVAIR DISKUS 100-50 MCG/DOSE AEPB INHALE 1 PUFF INTO THE LUNGS EVERY 12 HOURS. 3 each 0  . albuterol (PROVENTIL) (2.5 MG/3ML) 0.083% nebulizer solution Take 3 mLs (2.5 mg total) by nebulization every 6 (six) hours as needed for wheezing or shortness of breath. 75 mL 0  . amitriptyline (ELAVIL) 25 MG tablet Take 1 tablet (25 mg total) by mouth at bedtime. 30 tablet 5  . cetirizine (ZYRTEC) 10 MG tablet Take 1 tablet (10 mg total) by mouth daily. 30 tablet 11  . DEXILANT 30 MG capsule TAKE 1 CAPSULE (30MG  TOTAL) BY MOUTH DAILY. TAKE ON AN EMPTY STOMACH. 90 capsule 0  . EASY COMFORT PEN NEEDLES 32G X 4 MM MISC use as directed test 2 times daily  3  .  EDARBYCLOR 40-25 MG TABS TAKE 1 TABLET BY MOUTH DAILY. 60 tablet 0  . ibuprofen (ADVIL,MOTRIN) 600 MG tablet Take 1 tablet (600 mg total) by mouth every 6 (six) hours as needed. 30 tablet 0  . insulin aspart (NOVOLOG FLEXPEN) 100 UNIT/ML FlexPen Inject 10-14 Units into the skin 3 (three) times daily before meals. 30 mL 5  . Insulin Glargine (LANTUS SOLOSTAR) 100 UNIT/ML Solostar Pen Inject 60 units at bedtime. 5 pen 11  . Insulin Pen Needle (PEN NEEDLES) 32G X 5 MM MISC 100 each by Does not apply route 2 (two) times daily. 100 each 3  . Magnesium Citrate 100 MG TABS Take 100 mg by mouth every evening.    . meloxicam (MOBIC) 15 MG tablet Take 1 tablet (15 mg total) by mouth daily. 30 tablet 3  . montelukast (SINGULAIR) 10 MG tablet Take 1 tablet (10 mg total) by mouth daily. 30 tablet 5  . Multiple Vitamins-Minerals (MULTIVITAMIN ADULT PO) Take 1 tablet by mouth daily.    Marland Kitchen oxyCODONE-acetaminophen (PERCOCET) 5-325 MG tablet Take 1-2 tablets by mouth every 6 (six) hours as needed. 10 tablet 0  . Psyllium (FIBER) 0.52 g CAPS Take 1 capsule by mouth daily.    . rizatriptan (MAXALT-MLT) 10 MG disintegrating tablet Take 1 tablet (10 mg total) by mouth as needed for migraine. May repeat in 2 hours if needed.  Max 30 mg/24 h 12 tablet 6  . VENTOLIN  HFA 108 (90 Base) MCG/ACT inhaler INHALE 2 PUFFS INTO THE LUNGS EVERY 4 HOURS AS NEEDED FOR WHEEZING ORSHORTNESS OF BREATH. 3 each 0  . metFORMIN (GLUCOPHAGE) 500 MG tablet Take 1 tablet (500 mg total) by mouth 2 (two) times daily with a meal. 60 tablet 5   No current facility-administered medications for this visit.     Allergies: is allergic to corn-containing products; honey; mango flavor; and victoza [liraglutide].  Physical Exam:  BP 124/78   Pulse 81   Ht 5\' 5"  (1.651 m)   Wt 260 lb (117.9 kg)   BMI 43.27 kg/m  Body mass index is 43.27 kg/m. General appearance: Well nourished, well developed female in no acute distress.  Abdomen: diffusely non  tender to palpation, non distended, and no masses, hernias. C/d/i l/s port site at the umbilicus Neuro/Psych:  Normal mood and affect.    Assessment: pt doing well  Plan:  1. Postop check Nothing to do  2. Cervical dysplasia Pap smear just came back and d/w her will need another colpo   RTC: 4-6wks for colpo  Durene Romans MD Attending Center for [facility])

## 2019-02-16 ENCOUNTER — Encounter: Payer: Self-pay | Admitting: Obstetrics and Gynecology

## 2019-02-16 ENCOUNTER — Other Ambulatory Visit: Payer: Self-pay | Admitting: Family Medicine

## 2019-02-16 DIAGNOSIS — I10 Essential (primary) hypertension: Secondary | ICD-10-CM

## 2019-02-23 ENCOUNTER — Ambulatory Visit: Payer: Medicaid Other | Admitting: Obstetrics and Gynecology

## 2019-02-27 ENCOUNTER — Other Ambulatory Visit: Payer: Self-pay | Admitting: Family Medicine

## 2019-02-27 DIAGNOSIS — J4530 Mild persistent asthma, uncomplicated: Secondary | ICD-10-CM

## 2019-03-09 ENCOUNTER — Ambulatory Visit (INDEPENDENT_AMBULATORY_CARE_PROVIDER_SITE_OTHER): Payer: Self-pay | Admitting: Nurse Practitioner

## 2019-03-09 ENCOUNTER — Other Ambulatory Visit (HOSPITAL_COMMUNITY)
Admission: RE | Admit: 2019-03-09 | Discharge: 2019-03-09 | Disposition: A | Discharge status: Home / Self Care | Payer: Medicaid Other | Source: Ambulatory Visit | Attending: Nurse Practitioner | Admitting: Nurse Practitioner

## 2019-03-09 ENCOUNTER — Other Ambulatory Visit: Payer: Self-pay

## 2019-03-09 ENCOUNTER — Encounter: Payer: Self-pay | Admitting: Nurse Practitioner

## 2019-03-09 VITALS — Wt 262.6 lb

## 2019-03-09 DIAGNOSIS — B3731 Acute candidiasis of vulva and vagina: Secondary | ICD-10-CM

## 2019-03-09 DIAGNOSIS — Z6841 Body Mass Index (BMI) 40.0 and over, adult: Secondary | ICD-10-CM

## 2019-03-09 DIAGNOSIS — B373 Candidiasis of vulva and vagina: Secondary | ICD-10-CM

## 2019-03-09 DIAGNOSIS — N898 Other specified noninflammatory disorders of vagina: Secondary | ICD-10-CM

## 2019-03-09 DIAGNOSIS — R8290 Unspecified abnormal findings in urine: Secondary | ICD-10-CM

## 2019-03-09 LAB — POCT URINALYSIS DIP (DEVICE)
Bilirubin Urine: NEGATIVE
Glucose, UA: 500 mg/dL — AB
Nitrite: NEGATIVE
Protein, ur: 30 mg/dL — AB
Specific Gravity, Urine: >=1.03 (ref 1.005–1.030)
Urobilinogen, UA: 0.2 mg/dL (ref 0.0–1.0)
pH: 5.5 (ref 5.0–8.0)

## 2019-03-09 MED ORDER — FLUCONAZOLE 150 MG PO TABS: ORAL_TABLET | ORAL | 0 refills | Status: DC

## 2019-03-09 MED ORDER — TERCONAZOLE 0.4 % VA CREA: 1.0000 | TOPICAL_CREAM | Freq: Every day | VAGINAL | 0 refills | Status: DC

## 2019-03-09 NOTE — Progress Notes (Signed)
   GYNECOLOGY OFFICE VISIT NOTE   History:  43 y.o. [id number] here today in the walk in clinic for vaginal irritation that she thinks is an allergic reaction. She denies any abnormal vaginal discharge, bleeding, pelvic pain or other concerns.  Client has diabetes Type 2 and is on insulin.  She is obese.  Past Medical History:  Diagnosis Date  . Allergy    Foods:  mango, honey, corn.  Allergic to "everything tested"  15 years ago in [place]  . Asthma age 22 yo   Triggers:  cold weather, exercise, allergens, anxiety, URIs  . Cholelithiasis age 73 yo   Asymptomatic  . Diabetes mellitus 2012   Was diagnosed 2 years earlier with PCOS  . Elevated liver enzymes 09/13/2016  . History of kidney stones   . Hypertension 2015  . Kidney stones    family history of renal failure  . Migraines 2009  . Morbid obesity ([place])   . PCOS (polycystic ovarian syndrome) 2010    Past Surgical History:  Procedure Laterality Date  . COLPOSCOPY N/A   . LAPAROSCOPIC TUBAL LIGATION Bilateral 01/28/2019   Procedure: LAPAROSCOPIC TUBAL LIGATION AND PAP SMEAR;  Surgeon: Aletha Halim, MD;  Location: [place];  Service: Gynecology;  Laterality: Bilateral;  . TONSILLECTOMY AND ADENOIDECTOMY Bilateral 12/23/2018   Procedure: TONSILLECTOMY AND ADENOIDECTOMY;  Surgeon: Leta Baptist, MD;  Location: [place];  Service: ENT;  Laterality: Bilateral;    The following portions of the patient's history were reviewed and updated as appropriate: allergies, current medications, past family history, past medical history, past social history, past surgical history and problem list.     Review of Systems:  Pertinent items noted in HPI and remainder of comprehensive ROS otherwise negative.  Objective:  Physical Exam Wt 262 lb 9.6 oz (119.1 kg)   LMP 02/10/2019 (Exact Date)   BMI 43.70 kg/m  CONSTITUTIONAL: Well-developed, well-nourished female in no acute distress.  HENT:  Normocephalic,  atraumatic. External right and left ear normal.  SKIN: Skin is warm and dry. No rash noted. Not diaphoretic. No erythema. No pallor. NEUROLOGIC: Alert and oriented to person, place, and time. Normal muscle tone coordination. No cranial nerve deficit noted. PSYCHIATRIC: Normal mood and affect. Normal behavior. Normal judgment and thought content. ABDOMEN: Soft, no distention noted.  No lymph nodes palpated. PELVIC: Lots of dried discharge on external vulva.  Copius thick discharge consistent with yeast noted internally MUSCULOSKELETAL: Normal range of motion. No edema noted.  Labs and Imaging No results found.  Assessment & Plan:  1. Vaginal discharge  - Cervicovaginal ancillary only( [place])    2.  Yeast infection Prescribed Diflucan and Terazol vaginal cream.  Advised to use both.  Advised to check with PCP and look at serum glucose as yeast infections are common when blood sugars are elevated.  Routine preventative health maintenance measures emphasized. Please refer to After Visit Summary for other counseling recommendations.   Return for keep scheduled appointment for colposcopy.   Total face-to-face time with patient: 10 minutes.  Over 50% of encounter was spent on counseling and coordination of care.  Earlie Server, RN, MSN, NP-BC Nurse Practitioner, [facility] for [employer], [facility] 03/09/2019 6:07 PM

## 2019-03-11 LAB — CERVICOVAGINAL ANCILLARY ONLY
Bacterial Vaginitis (gardnerella): POSITIVE — AB
Candida Glabrata: NEGATIVE
Candida Vaginitis: POSITIVE — AB
Chlamydia: NEGATIVE
Comment: NEGATIVE
Comment: NEGATIVE
Comment: NEGATIVE
Comment: NEGATIVE
Comment: NEGATIVE
Comment: NORMAL
Neisseria Gonorrhea: NEGATIVE
Trichomonas: NEGATIVE

## 2019-03-12 LAB — URINE CULTURE

## 2019-03-12 MED ORDER — CEPHALEXIN 500 MG PO CAPS: 500.0000 mg | ORAL_CAPSULE | Freq: Four times a day (QID) | ORAL | 0 refills | Status: AC

## 2019-03-12 MED ORDER — TINIDAZOLE 500 MG PO TABS: 1.0000 g | ORAL_TABLET | Freq: Every day | ORAL | 0 refills | Status: AC

## 2019-03-12 NOTE — Progress Notes (Signed)
UTI noted and BV noted on lab results.  Client's allergy interfered with ordering the usual less expensive medications.  Ordered Tinadazole for BV and Keflex for UTI.  MyChart message sent to client.  Treated for yeast at clinic visit based on clinical assessment.  Earlie Server, RN, MSN, NP-BC Nurse Practitioner, [facility] for [employer], [place] [facility] 03/12/2019 10:08 PM

## 2019-03-12 NOTE — Addendum Note (Signed)
Addended by: Virginia Rochester on: 03/12/2019 10:10 PM   Modules accepted: Orders

## 2019-03-13 ENCOUNTER — Telehealth: Payer: Self-pay

## 2019-03-13 NOTE — Telephone Encounter (Signed)
Pt left VM on nurse line requesting a call back. Pt has questions about test results from visit on 03/09/19 and medications prescribed.

## 2019-03-16 ENCOUNTER — Telehealth: Payer: Self-pay | Admitting: Lactation Services

## 2019-03-16 MED ORDER — METRONIDAZOLE 500 MG PO TABS: 500.0000 mg | ORAL_TABLET | Freq: Two times a day (BID) | ORAL | 0 refills | Status: DC

## 2019-03-16 NOTE — Telephone Encounter (Signed)
Attempted to call pt to answer her questions. Pt did not answer. She has seen her My Chart Message. Advised pt to call the office with further questions or concerns.

## 2019-03-16 NOTE — Telephone Encounter (Signed)
Pt called in with concerns that she cannot afford the Tindamax and wants Flagyl called in.   Called and spoke with pt. Pt reports she has taken Flagyl in the past and did ok. She reports she is only allergic to fresh corn. Flagyl notes that it is contraindicated due to corn containing products.   Discussed with pt that if she has any allergic reactions to stop taking immediately and take a Benadryl and seek medical care. Pt voiced understanding.   Spoke with Dr. Kennon Rounds who agreed to the Flagyl prescription.

## 2019-03-18 ENCOUNTER — Other Ambulatory Visit: Payer: Self-pay | Admitting: Family Medicine

## 2019-03-18 ENCOUNTER — Ambulatory Visit: Payer: Medicaid Other | Admitting: Obstetrics and Gynecology

## 2019-03-18 DIAGNOSIS — G4700 Insomnia, unspecified: Secondary | ICD-10-CM

## 2019-03-23 ENCOUNTER — Other Ambulatory Visit: Payer: Self-pay

## 2019-03-23 ENCOUNTER — Encounter: Payer: Self-pay | Admitting: Family Medicine

## 2019-03-23 ENCOUNTER — Ambulatory Visit (INDEPENDENT_AMBULATORY_CARE_PROVIDER_SITE_OTHER): Payer: Self-pay | Admitting: Family Medicine

## 2019-03-23 VITALS — HR 92 | Temp 98.0°F | Ht 65.0 in | Wt 262.8 lb

## 2019-03-23 DIAGNOSIS — B379 Candidiasis, unspecified: Secondary | ICD-10-CM | POA: Insufficient documentation

## 2019-03-23 DIAGNOSIS — Z09 Encounter for follow-up examination after completed treatment for conditions other than malignant neoplasm: Secondary | ICD-10-CM

## 2019-03-23 DIAGNOSIS — E66813 Obesity, class 3: Secondary | ICD-10-CM | POA: Insufficient documentation

## 2019-03-23 DIAGNOSIS — Z6841 Body Mass Index (BMI) 40.0 and over, adult: Secondary | ICD-10-CM

## 2019-03-23 DIAGNOSIS — R7309 Other abnormal glucose: Secondary | ICD-10-CM | POA: Insufficient documentation

## 2019-03-23 DIAGNOSIS — Z794 Long term (current) use of insulin: Secondary | ICD-10-CM

## 2019-03-23 DIAGNOSIS — E119 Type 2 diabetes mellitus without complications: Secondary | ICD-10-CM

## 2019-03-23 DIAGNOSIS — I10 Essential (primary) hypertension: Secondary | ICD-10-CM

## 2019-03-23 DIAGNOSIS — R739 Hyperglycemia, unspecified: Secondary | ICD-10-CM | POA: Insufficient documentation

## 2019-03-23 LAB — GLUCOSE, POCT (MANUAL RESULT ENTRY): POC Glucose: 163 mg/dl — AB (ref 70–99)

## 2019-03-23 LAB — POCT URINALYSIS DIPSTICK
Blood, UA: NEGATIVE
Glucose, UA: NEGATIVE
Nitrite, UA: NEGATIVE
Protein, UA: POSITIVE — AB
Spec Grav, UA: >=1.03 — AB (ref 1.010–1.025)
Urobilinogen, UA: 0.2 E.U./dL
pH, UA: 5.5 (ref 5.0–8.0)

## 2019-03-23 LAB — POCT GLYCOSYLATED HEMOGLOBIN (HGB A1C): Hemoglobin A1C: 10.2 % — AB (ref 4.0–5.6)

## 2019-03-23 MED ORDER — GLIPIZIDE 10 MG PO TABS: 10.0000 mg | ORAL_TABLET | Freq: Two times a day (BID) | ORAL | 3 refills | Status: DC

## 2019-03-23 MED ORDER — FLUCONAZOLE 150 MG PO TABS: 150.0000 mg | ORAL_TABLET | Freq: Once | ORAL | 0 refills | Status: AC

## 2019-03-23 NOTE — Progress Notes (Signed)
Patient [facility]   Established Patient Follow Up  Subjective:  Patient ID: Lori Shea, female    DOB: 07/31/1975  Age: 43 y.o. MRN: [id number]  CC:  Chief Complaint  Patient presents with  . Follow-up    ED 02/01/2019 NAUSEA  . Follow-up    Former Community education officer Patient - HTN, DM  . Urinary Tract Infection    Dysuria, vaginal itching    HPI Lori Shea is a 43 year old female who presents for Follow Up today.  Past Medical History:  Diagnosis Date  . Allergy    Foods:  mango, honey, corn.  Allergic to "everything tested"  15 years ago in [place]  . Asthma age 39 yo   Triggers:  cold weather, exercise, allergens, anxiety, URIs  . Cholelithiasis age 75 yo   Asymptomatic  . Diabetes mellitus 2012   Was diagnosed 2 years earlier with PCOS  . Elevated liver enzymes 09/13/2016  . History of kidney stones   . Hypertension 2015  . Kidney stones    family history of renal failure  . Migraines 2009  . Morbid obesity ([place])   . PCOS (polycystic ovarian syndrome) 2010    Current Status: This will be Lori Shea's initial office visit with me. She was previously seeing Lanae Boast, NP for her PCP needs. Since her last office visit, she has c/o continual yeast infections. Her most recent normal range of preprandial blood glucose levels have been between 150-200. She has seen low range of 92 and high of 280 since his last office visit. She denies fatigue, frequent urination, blurred vision, excessive hunger, excessive thirst, weight gain, weight loss, and poor wound healing. She continues to check her feet regularly. She denies visual changes, chest pain, cough, shortness of breath, heart palpitations, and falls. She has occasional headaches and dizziness with position changes. Denies severe headaches, confusion, seizures, double vision, and blurred vision, nausea and vomiting. She denies fevers, chills, recent infections, weight loss, and night  sweats. No reports of GI problems such as diarrhea, and constipation. She has no reports of blood in stools, dysuria and hematuria. She denies pain today.   Past Surgical History:  Procedure Laterality Date  . COLPOSCOPY N/A   . LAPAROSCOPIC TUBAL LIGATION Bilateral 01/28/2019   Procedure: LAPAROSCOPIC TUBAL LIGATION AND PAP SMEAR;  Surgeon: Aletha Halim, MD;  Location: [facility];  Service: Gynecology;  Laterality: Bilateral;  . TONSILLECTOMY AND ADENOIDECTOMY Bilateral 12/23/2018   Procedure: TONSILLECTOMY AND ADENOIDECTOMY;  Surgeon: Leta Baptist, MD;  Location: [facility];  Service: ENT;  Laterality: Bilateral;    Family History  Problem Relation Age of Onset  . Diabetes Father   . Hyperlipidemia Father   . Hypertension Father   . Heart disease Father   . Kidney disease Father        Developed after 2nd CABG  . Diabetes Paternal Grandfather   . Heart disease Paternal Grandfather   . Hyperlipidemia Paternal Grandfather   . Hypertension Paternal Grandfather   . Kidney disease Paternal Grandfather     Social History   Socioeconomic History  . Marital status: Divorced    Spouse name: Not on file  . Number of children: 1  . Years of education: some comm. college  . Highest education level: Not on file  Occupational History  . Occupation: CNA    Comment: Veterinary surgeon  . Financial resource strain: Not on  file  . Food insecurity    Worry: Not on file    Inability: Not on file  . Transportation needs    Medical: Not on file    Non-medical: Not on file  Tobacco Use  . Smoking status: Never Smoker  . Smokeless tobacco: Never Used  Substance and Sexual Activity  . Alcohol use: No  . Drug use: No  . Sexual activity: Yes    Birth control/protection: None    Comment: Sprintec  Lifestyle  . Physical activity    Days per week: Not on file    Minutes per session: Not on file  . Stress: Not on file  Relationships  .  Social Herbalist on phone: Not on file    Gets together: Not on file    Attends religious service: Not on file    Active member of club or organization: Not on file    Attends meetings of clubs or organizations: Not on file    Relationship status: Not on file  . Intimate partner violence    Fear of current or ex partner: Not on file    Emotionally abused: Not on file    Physically abused: Not on file    Forced sexual activity: Not on file  Other Topics Concern  . Not on file  Social History Narrative   Originally from [place], [place] to [place] in 1988.   In [place] in [place] at home with daughter on [address].    Outpatient Medications Prior to Visit  Medication Sig Dispense Refill  . ADVAIR DISKUS 100-50 MCG/DOSE AEPB INHALE 1 PUFF INTO THE LUNGS EVERY 12 HOURS. 2 each 0  . albuterol (PROVENTIL) (2.5 MG/3ML) 0.083% nebulizer solution Take 3 mLs (2.5 mg total) by nebulization every 6 (six) hours as needed for wheezing or shortness of breath. 75 mL 0  . cetirizine (ZYRTEC) 10 MG tablet Take 1 tablet (10 mg total) by mouth daily. 30 tablet 11  . DEXILANT 30 MG capsule TAKE 1 CAPSULE (30MG  TOTAL) BY MOUTH DAILY. TAKE ON AN EMPTY STOMACH. 90 capsule 0  . EASY COMFORT PEN NEEDLES 32G X 4 MM MISC use as directed test 2 times daily  3  . EDARBYCLOR 40-25 MG TABS TAKE 1 TABLET BY MOUTH DAILY. 60 tablet 0  . ibuprofen (ADVIL,MOTRIN) 600 MG tablet Take 1 tablet (600 mg total) by mouth every 6 (six) hours as needed. 30 tablet 0  . insulin aspart (NOVOLOG FLEXPEN) 100 UNIT/ML FlexPen Inject 10-14 Units into the skin 3 (three) times daily before meals. 30 mL 5  . Insulin Glargine (LANTUS SOLOSTAR) 100 UNIT/ML Solostar Pen Inject 60 units at bedtime. 5 pen 11  . Insulin Pen Needle (PEN NEEDLES) 32G X 5 MM MISC 100 each by Does not apply route 2 (two) times daily. 100 each 3  . Magnesium Citrate 100 MG TABS Take 100 mg by mouth every evening.    . meloxicam  (MOBIC) 15 MG tablet Take 1 tablet (15 mg total) by mouth daily. 30 tablet 3  . metFORMIN (GLUCOPHAGE) 500 MG tablet Take 1 tablet (500 mg total) by mouth 2 (two) times daily with a meal. 60 tablet 5  . montelukast (SINGULAIR) 10 MG tablet Take 1 tablet (10 mg total) by mouth daily. 30 tablet 5  . Multiple Vitamins-Minerals (MULTIVITAMIN ADULT PO) Take 1 tablet by mouth daily.    . Psyllium (FIBER) 0.52 g CAPS Take 1 capsule  by mouth daily.    . rizatriptan (MAXALT-MLT) 10 MG disintegrating tablet Take 1 tablet (10 mg total) by mouth as needed for migraine. May repeat in 2 hours if needed.  Max 30 mg/24 h 12 tablet 6  . terconazole (TERAZOL 7) 0.4 % vaginal cream Place 1 applicator vaginally at bedtime. Use for 7 days. 45 g 0  . VENTOLIN HFA 108 (90 Base) MCG/ACT inhaler INHALE 2 PUFFS INTO THE LUNGS EVERY 4 HOURS AS NEEDED FOR WHEEZING ORSHORTNESS OF BREATH. 3 each 0  . amitriptyline (ELAVIL) 25 MG tablet TAKE 1 TABLET BY MOUTH NIGHTLY AT BEDTIME 30 tablet 5  . fluconazole (DIFLUCAN) 150 MG tablet One tablet by mouth on day 1 and one tablet by mouth on day 4 1 tablet 0  . metroNIDAZOLE (FLAGYL) 500 MG tablet Take 1 tablet (500 mg total) by mouth 2 (two) times daily. 14 tablet 0  . oxyCODONE-acetaminophen (PERCOCET) 5-325 MG tablet Take 1-2 tablets by mouth every 6 (six) hours as needed. 10 tablet 0   No facility-administered medications prior to visit.     Allergies  Allergen Reactions  . Corn-Containing Products Hives    "really bad asthma attacks"  . Honey Shortness Of Breath  . 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 Victoza [Liraglutide] Nausea And Vomiting    ROS Review of Systems  Constitutional: Negative.   HENT: Negative.   Eyes: Negative.   Respiratory: Negative.   Cardiovascular: Negative.   Gastrointestinal: Negative.   Endocrine: Positive for polydipsia, polyphagia and polyuria.  Genitourinary: Negative.   Musculoskeletal: Negative.   Skin: Negative.    Allergic/Immunologic: Negative.   Neurological: Positive for dizziness (occasional ) and headaches (occasional ).  Hematological: Negative.   Psychiatric/Behavioral: Negative.    Objective:    Physical Exam  Constitutional: She is oriented to person, place, and time. She appears well-developed and well-nourished.  HENT:  Head: Normocephalic and atraumatic.  Eyes: Conjunctivae and EOM are normal.  Neck: Normal range of motion. Neck supple.  Cardiovascular: Normal rate, regular rhythm, normal heart sounds and intact distal pulses.  Pulmonary/Chest: Effort normal and breath sounds normal.  Abdominal: Soft. Bowel sounds are normal. She exhibits distension (obese).  Musculoskeletal: Normal range of motion.  Neurological: She is alert and oriented to person, place, and time. She has normal reflexes.  Skin: Skin is warm and dry.  Psychiatric: She has a normal mood and affect. Her behavior is normal. Judgment and thought content normal.  Nursing note and vitals reviewed.   Pulse 92   Temp 98 F (36.7 C) (Oral)   Ht 5\' 5"  (1.651 m)   Wt 262 lb 12.8 oz (119.2 kg)   LMP 03/08/2019   SpO2 98%   BMI 43.73 kg/m  Wt Readings from Last 3 Encounters:  03/23/19 262 lb 12.8 oz (119.2 kg)  03/09/19 262 lb 9.6 oz (119.1 kg)  02/13/19 260 lb (117.9 kg)     Health Maintenance Due  Topic Date Due  . OPHTHALMOLOGY EXAM  06/03/1984  . HIV Screening  06/03/1989  . FOOT EXAM  03/15/2019    There are no preventive care reminders to display for this patient.  Lab Results  Component Value Date   TSH 5.090 (H) 02/10/2018   Lab Results  Component Value Date   WBC 11.5 (H) 02/01/2019   HGB 13.4 02/01/2019   HCT 38.5 02/01/2019   MCV 86.1 02/01/2019   PLT 366 02/01/2019   Lab Results  Component Value Date   NA 135 02/01/2019   K 3.6 02/01/2019   CO2 23 02/01/2019   GLUCOSE 394 (H) 02/01/2019   BUN 11 02/01/2019   CREATININE 0.75 02/01/2019   BILITOT 0.3 02/01/2019   ALKPHOS 97  02/01/2019   AST 27 02/01/2019   ALT 29 02/01/2019   PROT 6.4 (L) 02/01/2019   ALBUMIN 3.3 (L) 02/01/2019   CALCIUM 8.9 02/01/2019   ANIONGAP 10 02/01/2019   Lab Results  Component Value Date   CHOL 180 07/19/2016   Lab Results  Component Value Date   HDL 60 07/19/2016   Lab Results  Component Value Date   LDLCALC 98 07/19/2016   Lab Results  Component Value Date   TRIG 109 07/19/2016   Lab Results  Component Value Date   CHOLHDL 3.6 Ratio 02/09/2009   Lab Results  Component Value Date   HGBA1C 10.2 (A) 03/23/2019      Assessment & Plan:   1. Type 2 diabetes mellitus without complication, with long-term current use of insulin (HCC) Blood glucose is at 163 today. She will continue medication as prescribed, to decrease foods/beverages high in sugars and carbs and follow Heart Healthy or DASH diet. Increase physical activity to at least 30 minutes cardio exercise daily. We will initiate Glucotrol today.  - Urinalysis Dipstick - POCT HgB A1C - Glucose (CBG) - glipiZIDE (GLUCOTROL) 10 MG tablet; Take 1 tablet (10 mg total) by mouth 2 (two) times daily before a meal.  Dispense: 60 tablet; Refill: 3  2. Hemoglobin A1C greater than 9%, indicating poor diabetic control Hgb A1c mildly decreased at 10.2 today. Monitor.   3. Hyperglycemia  4. Essential hypertension The current medical regimen is effective; blood pressure is stable at today; continue present plan and medications as prescribed. She will continue to take medications as prescribed, to decrease high sodium intake, excessive alcohol intake, increase potassium intake, smoking cessation, and increase physical activity of at least 30 minutes of cardio activity daily. She will continue to follow Heart Healthy or DASH diet.  5. Class 3 severe obesity due to excess calories with serious comorbidity and body mass index (BMI) of 40.0 to 44.9 in adult [facility]) Body mass index is 43.73 kg/m. Goal BMI  is <30. Encouraged efforts  to reduce weight include engaging in physical activity as tolerated with goal of 150 minutes per week. Improve dietary choices and eat a meal regimen consistent with a Mediterranean or DASH diet. Reduce simple carbohydrates. Do not skip meals and eat healthy snacks throughout the day to avoid over-eating at dinner. Set a goal weight loss that is achievable for you.  6. Morbid obesity ([place])  7. Yeast infection Frequent. Probable r/t increased Hgb A1c We will initiate Fluconazole today.  - fluconazole (DIFLUCAN) 150 MG tablet; Take 1 tablet (150 mg total) by mouth once for 1 dose.  Dispense: 1 tablet; Refill: 0  8. Follow up She will follow up in 3 months.   Meds ordered this encounter  Medications  . glipiZIDE (GLUCOTROL) 10 MG tablet    Sig: Take 1 tablet (10 mg total) by mouth 2 (two) times daily before a meal.    Dispense:  60 tablet    Refill:  3  . fluconazole (DIFLUCAN) 150 MG tablet    Sig: Take 1 tablet (150 mg total) by mouth once for 1 dose.    Dispense:  1 tablet    Refill:  0    Orders Placed This Encounter  Procedures  .  Urinalysis Dipstick  . POCT HgB A1C  . Glucose (CBG)    Referral Orders  No referral(s) requested today    Kathe Becton,  MSN, FNP-BC [address] [place], [place] [address] [phone] fax  Problem List Items Addressed This Visit      Cardiovascular and Mediastinum   Hypertension     Other   Morbid obesity ([place])   Relevant Medications   glipiZIDE (GLUCOTROL) 10 MG tablet    Other Visit Diagnoses    Type 2 diabetes mellitus without complication, with long-term current use of insulin ([place])    -  Primary   Relevant Medications   glipiZIDE (GLUCOTROL) 10 MG tablet   Other Relevant Orders   Urinalysis Dipstick (Completed)   POCT HgB A1C (Completed)   Glucose (CBG) (Completed)   Hemoglobin A1C greater than 9%, indicating poor diabetic  control       Relevant Medications   glipiZIDE (GLUCOTROL) 10 MG tablet   Hyperglycemia       Class 3 severe obesity due to excess calories with serious comorbidity and body mass index (BMI) of 40.0 to 44.9 in adult [facility]       Relevant Medications   glipiZIDE (GLUCOTROL) 10 MG tablet   Yeast infection       Relevant Medications   fluconazole (DIFLUCAN) 150 MG tablet   Follow up          Meds ordered this encounter  Medications  . glipiZIDE (GLUCOTROL) 10 MG tablet    Sig: Take 1 tablet (10 mg total) by mouth 2 (two) times daily before a meal.    Dispense:  60 tablet    Refill:  3  . fluconazole (DIFLUCAN) 150 MG tablet    Sig: Take 1 tablet (150 mg total) by mouth once for 1 dose.    Dispense:  1 tablet    Refill:  0    Follow-up: Return in about 3 months (around 06/21/2019).    Azzie Glatter, FNP

## 2019-03-23 NOTE — Patient Instructions (Addendum)
DASH Eating Plan DASH stands for "Dietary Approaches to Stop Hypertension." The DASH eating plan is a healthy eating plan that has been shown to reduce high blood pressure (hypertension). It may also reduce your risk for type 2 diabetes, heart disease, and stroke. The DASH eating plan may also help with weight loss. What are tips for following this plan?  General guidelines  Avoid eating more than 2,300 mg (milligrams) of salt (sodium) a day. If you have hypertension, you may need to reduce your sodium intake to 1,500 mg a day.  Limit alcohol intake to no more than 1 drink a day for nonpregnant women and 2 drinks a day for men. One drink equals 12 oz of beer, 5 oz of wine, or 1 oz of hard liquor.  Work with your health care provider to maintain a healthy body weight or to lose weight. Ask what an ideal weight is for you.  Get at least 30 minutes of exercise that causes your heart to beat faster (aerobic exercise) most days of the week. Activities may include walking, swimming, or biking.  Work with your health care provider or diet and nutrition specialist (dietitian) to adjust your eating plan to your individual calorie needs. Reading food labels   Check food labels for the amount of sodium per serving. Choose foods with less than 5 percent of the Daily Value of sodium. Generally, foods with less than 300 mg of sodium per serving fit into this eating plan.  To find whole grains, look for the word "whole" as the first word in the ingredient list. Shopping  Buy products labeled as "low-sodium" or "no salt added."  Buy fresh foods. Avoid canned foods and premade or frozen meals. Cooking  Avoid adding salt when cooking. Use salt-free seasonings or herbs instead of table salt or sea salt. Check with your health care provider or pharmacist before using salt substitutes.  Do not fry foods. Cook foods using healthy methods such as baking, boiling, grilling, and broiling instead.  Cook with  heart-healthy oils, such as olive, canola, soybean, or sunflower oil. Meal planning  Eat a balanced diet that includes: ? 5 or more servings of fruits and vegetables each day. At each meal, try to fill half of your plate with fruits and vegetables. ? Up to 6-8 servings of whole grains each day. ? Less than 6 oz of lean meat, poultry, or fish each day. A 3-oz serving of meat is about the same size as a deck of cards. One egg equals 1 oz. ? 2 servings of low-fat dairy each day. ? A serving of nuts, seeds, or beans 5 times each week. ? Heart-healthy fats. Healthy fats called Omega-3 fatty acids are found in foods such as flaxseeds and coldwater fish, like sardines, salmon, and mackerel.  Limit how much you eat of the following: ? Canned or prepackaged foods. ? Food that is high in trans fat, such as fried foods. ? Food that is high in saturated fat, such as fatty meat. ? Sweets, desserts, sugary drinks, and other foods with added sugar. ? Full-fat dairy products.  Do not salt foods before eating.  Try to eat at least 2 vegetarian meals each week.  Eat more home-cooked food and less restaurant, buffet, and fast food.  When eating at a restaurant, ask that your food be prepared with less salt or no salt, if possible. What foods are recommended? The items listed may not be a complete list. Talk with your dietitian about   what dietary choices are best for you. Grains Whole-grain or whole-wheat bread. Whole-grain or whole-wheat pasta. Brown rice. Oatmeal. Quinoa. Bulgur. Whole-grain and low-sodium cereals. Pita bread. Low-fat, low-sodium crackers. Whole-wheat flour tortillas. Vegetables Fresh or frozen vegetables (raw, steamed, roasted, or grilled). Low-sodium or reduced-sodium tomato and vegetable juice. Low-sodium or reduced-sodium tomato sauce and tomato paste. Low-sodium or reduced-sodium canned vegetables. Fruits All fresh, dried, or frozen fruit. Canned fruit in natural juice (without  added sugar). Meat and other protein foods Skinless chicken or turkey. Ground chicken or turkey. Pork with fat trimmed off. Fish and seafood. Egg whites. Dried beans, peas, or lentils. Unsalted nuts, nut butters, and seeds. Unsalted canned beans. Lean cuts of beef with fat trimmed off. Low-sodium, lean deli meat. Dairy Low-fat (1%) or fat-free (skim) milk. Fat-free, low-fat, or reduced-fat cheeses. Nonfat, low-sodium ricotta or cottage cheese. Low-fat or nonfat yogurt. Low-fat, low-sodium cheese. Fats and oils Soft margarine without trans fats. Vegetable oil. Low-fat, reduced-fat, or light mayonnaise and salad dressings (reduced-sodium). Canola, safflower, olive, soybean, and sunflower oils. Avocado. Seasoning and other foods Herbs. Spices. Seasoning mixes without salt. Unsalted popcorn and pretzels. Fat-free sweets. What foods are not recommended? The items listed may not be a complete list. Talk with your dietitian about what dietary choices are best for you. Grains Baked goods made with fat, such as croissants, muffins, or some breads. Dry pasta or rice meal packs. Vegetables Creamed or fried vegetables. Vegetables in a cheese sauce. Regular canned vegetables (not low-sodium or reduced-sodium). Regular canned tomato sauce and paste (not low-sodium or reduced-sodium). Regular tomato and vegetable juice (not low-sodium or reduced-sodium). Pickles. Olives. Fruits Canned fruit in a light or heavy syrup. Fried fruit. Fruit in cream or butter sauce. Meat and other protein foods Fatty cuts of meat. Ribs. Fried meat. Bacon. Sausage. Bologna and other processed lunch meats. Salami. Fatback. Hotdogs. Bratwurst. Salted nuts and seeds. Canned beans with added salt. Canned or smoked fish. Whole eggs or egg yolks. Chicken or turkey with skin. Dairy Whole or 2% milk, cream, and half-and-half. Whole or full-fat cream cheese. Whole-fat or sweetened yogurt. Full-fat cheese. Nondairy creamers. Whipped toppings.  Processed cheese and cheese spreads. Fats and oils Butter. Stick margarine. Lard. Shortening. Ghee. Bacon fat. Tropical oils, such as coconut, palm kernel, or palm oil. Seasoning and other foods Salted popcorn and pretzels. Onion salt, garlic salt, seasoned salt, table salt, and sea salt. Worcestershire sauce. Tartar sauce. Barbecue sauce. Teriyaki sauce. Soy sauce, including reduced-sodium. Steak sauce. Canned and packaged gravies. Fish sauce. Oyster sauce. Cocktail sauce. Horseradish that you find on the shelf. Ketchup. Mustard. Meat flavorings and tenderizers. Bouillon cubes. Hot sauce and Tabasco sauce. Premade or packaged marinades. Premade or packaged taco seasonings. Relishes. Regular salad dressings. Where to find more information:  National Heart, Lung, and Blood Institute: www.nhlbi.nih.gov  American Heart Association: www.heart.org Summary  The DASH eating plan is a healthy eating plan that has been shown to reduce high blood pressure (hypertension). It may also reduce your risk for type 2 diabetes, heart disease, and stroke.  With the DASH eating plan, you should limit salt (sodium) intake to 2,300 mg a day. If you have hypertension, you may need to reduce your sodium intake to 1,500 mg a day.  When on the DASH eating plan, aim to eat more fresh fruits and vegetables, whole grains, lean proteins, low-fat dairy, and heart-healthy fats.  Work with your health care provider or diet and nutrition specialist (dietitian) to adjust your eating plan to your   individual calorie needs. This information is not intended to replace advice given to you by your health care provider. Make sure you discuss any questions you have with your health care provider. Document Released: 03/29/2011 Document Revised: 03/22/2017 Document Reviewed: 04/02/2016 Elsevier Patient Education  2020 [place].    Vaginal Yeast infection, Adult  Vaginal yeast infection is a condition that causes vaginal  discharge as well as soreness, swelling, and redness (inflammation) of the vagina. This is a common condition. Some women get this infection frequently. What are the causes? This condition is caused by a change in the normal balance of the yeast (candida) and bacteria that live in the vagina. This change causes an overgrowth of yeast, which causes the inflammation. What increases the risk? The condition is more likely to develop in women who:  Take antibiotic medicines.  Have diabetes.  Take birth control pills.  Are pregnant.  Douche often.  Have a weak body defense system (immune system).  Have been taking steroid medicines for a long time.  Frequently wear tight clothing. What are the signs or symptoms? Symptoms of this condition include:  White, thick, creamy vaginal discharge.  Swelling, itching, redness, and irritation of the vagina. The lips of the vagina (vulva) may be affected as well.  Pain or a burning feeling while urinating.  Pain during sex. How is this diagnosed? This condition is diagnosed based on:  Your medical history.  A physical exam.  A pelvic exam. Your health care provider will examine a sample of your vaginal discharge under a microscope. Your health care provider may send this sample for testing to confirm the diagnosis. How is this treated? This condition is treated with medicine. Medicines may be over-the-counter or prescription. You may be told to use one or more of the following:  Medicine that is taken by mouth (orally).  Medicine that is applied as a cream (topically).  Medicine that is inserted directly into the vagina (suppository). Follow these instructions at home:  Lifestyle  Do not have sex until your health care provider approves. Tell your sex partner that you have a yeast infection. That person should go to his or her health care provider and ask if they should also be treated.  Do not wear tight clothes, such as pantyhose  or tight pants.  Wear breathable cotton underwear. General instructions  Take or apply over-the-counter and prescription medicines only as told by your health care provider.  Eat more yogurt. This may help to keep your yeast infection from returning.  Do not use tampons until your health care provider approves.  Try taking a sitz bath to help with discomfort. This is a warm water bath that is taken while you are sitting down. The water should only come up to your hips and should cover your buttocks. Do this 3-4 times per day or as told by your health care provider.  Do not douche.  If you have diabetes, keep your blood sugar levels under control.  Keep all follow-up visits as told by your health care provider. This is important. Contact a health care provider if:  You have a fever.  Your symptoms go away and then return.  Your symptoms do not get better with treatment.  Your symptoms get worse.  You have new symptoms.  You develop blisters in or around your vagina.  You have blood coming from your vagina and it is not your menstrual period.  You develop pain in your abdomen. Summary  Vaginal  yeast infection is a condition that causes discharge as well as soreness, swelling, and redness (inflammation) of the vagina.  This condition is treated with medicine. Medicines may be over-the-counter or prescription.  Take or apply over-the-counter and prescription medicines only as told by your health care provider.  Do not douche. Do not have sex or use tampons until your health care provider approves.  Contact a health care provider if your symptoms do not get better with treatment or your symptoms go away and then return. This information is not intended to replace advice given to you by your health care provider. Make sure you discuss any questions you have with your health care provider. Document Released: 01/17/2005 Document Revised: 08/26/2017 Document Reviewed: 08/26/2017  Elsevier Patient Education  [place]. Metronidazole tablets or capsules What is this medicine? METRONIDAZOLE (me troe NI da zole) is an antiinfective. It is used to treat certain kinds of bacterial and protozoal infections. It will not work for colds, flu, or other viral infections. This medicine may be used for other purposes; ask your health care provider or pharmacist if you have questions. COMMON BRAND NAME(S): Flagyl What should I tell my health care provider before I take this medicine? They need to know if you have any of these conditions:  Cockayne syndrome  history of blood diseases, like sickle cell anemia or leukemia  history of yeast infection  if you often drink alcohol  liver disease  an unusual or allergic reaction to metronidazole, nitroimidazoles, or other medicines, foods, dyes, or preservatives  pregnant or trying to get pregnant  breast-feeding How should I use this medicine? Take this medicine by mouth with a full glass of water. Follow the directions on the prescription label. Take your medicine at regular intervals. Do not take your medicine more often than directed. Take all of your medicine as directed even if you think you are better. Do not skip doses or stop your medicine early. Talk to your pediatrician regarding the use of this medicine in children. Special care may be needed. Overdosage: If you think you have taken too much of this medicine contact a poison control center or emergency room at once. NOTE: This medicine is only for you. Do not share this medicine with others. What if I miss a dose? If you miss a dose, take it as soon as you can. If it is almost time for your next dose, take only that dose. Do not take double or extra doses. What may interact with this medicine? Do not take this medicine with any of the following medications:  alcohol or any product that contains alcohol  cisapride  disulfiram  dronedarone  pimozide   thioridazine This medicine may also interact with the following medications:  amiodarone  birth control pills  busulfan  carbamazepine  cimetidine  cyclosporine  fluorouracil  lithium  other medicines that prolong the QT interval (cause an abnormal heart rhythm) like dofetilide, ziprasidone  phenobarbital  phenytoin  quinidine  tacrolimus  vecuronium  warfarin This list may not describe all possible interactions. Give your health care provider a list of all the medicines, herbs, non-prescription drugs, or dietary supplements you use. Also tell them if you smoke, drink alcohol, or use illegal drugs. Some items may interact with your medicine. What should I watch for while using this medicine? Tell your doctor or health care professional if your symptoms do not improve or if they get worse. You may get drowsy or dizzy. Do not drive,  use machinery, or do anything that needs mental alertness until you know how this medicine affects you. Do not stand or sit up quickly, especially if you are an older patient. This reduces the risk of dizzy or fainting spells. Ask your doctor or health care professional if you should avoid alcohol. Many nonprescription cough and cold products contain alcohol. Metronidazole can cause an unpleasant reaction when taken with alcohol. The reaction includes flushing, headache, nausea, vomiting, sweating, and increased thirst. The reaction can last from 30 minutes to several hours. If you are being treated for a sexually transmitted disease, avoid sexual contact until you have finished your treatment. Your sexual partner may also need treatment. What side effects may I notice from receiving this medicine? Side effects that you should report to your doctor or health care professional as soon as possible:  allergic reactions like skin rash or hives, swelling of the face, lips, or tongue  confusion  fast, irregular heartbeat  fever, chills, sore throat   fever with rash, swollen lymph nodes, or swelling of the face  pain, tingling, numbness in the hands or feet  redness, blistering, peeling or loosening of the skin, including inside the mouth  seizures  sign and symptoms of liver injury like dark yellow or brown urine; general ill feeling or flu-like symptoms; light colored stools; loss of appetite; nausea; right upper belly pain; unusually weak or tired; yellowing of the eyes or skin  vaginal discharge, itching, or odor in women Side effects that usually do not require medical attention (report to your doctor or health care professional if they continue or are bothersome):  changes in taste  diarrhea  headache  nausea, vomiting  stomach pain This list may not describe all possible side effects. Call your doctor for medical advice about side effects. You may report side effects to FDA at 1-800-FDA-1088. Where should I keep my medicine? Keep out of the reach of children. Store at room temperature below 25 degrees C (77 degrees F). Protect from light. Keep container tightly closed. Throw away any unused medicine after the expiration date. NOTE: This sheet is a summary. It may not cover all possible information. If you have questions about this medicine, talk to your doctor, pharmacist, or health care provider.  2020 Elsevier/Gold Standard (2018-04-01 06:52:33)

## 2019-03-27 ENCOUNTER — Other Ambulatory Visit: Payer: Self-pay

## 2019-03-27 DIAGNOSIS — IMO0001 Reserved for inherently not codable concepts without codable children: Secondary | ICD-10-CM

## 2019-03-27 DIAGNOSIS — J4520 Mild intermittent asthma, uncomplicated: Secondary | ICD-10-CM

## 2019-03-27 MED ORDER — ALBUTEROL SULFATE (2.5 MG/3ML) 0.083% IN NEBU: 2.5000 mg | INHALATION_SOLUTION | Freq: Four times a day (QID) | RESPIRATORY_TRACT | 0 refills | Status: DC | PRN

## 2019-03-28 ENCOUNTER — Telehealth: Payer: Self-pay | Admitting: Family Medicine

## 2019-03-28 ENCOUNTER — Other Ambulatory Visit: Payer: Self-pay

## 2019-03-28 ENCOUNTER — Emergency Department (HOSPITAL_COMMUNITY)
Admission: EM | Admit: 2019-03-28 | Discharge: 2019-03-28 | Disposition: A | Discharge status: Home / Self Care | Payer: Medicaid Other | Attending: Emergency Medicine | Admitting: Emergency Medicine

## 2019-03-28 ENCOUNTER — Emergency Department (HOSPITAL_COMMUNITY): Payer: Medicaid Other

## 2019-03-28 ENCOUNTER — Encounter: Payer: Self-pay | Admitting: Family Medicine

## 2019-03-28 ENCOUNTER — Encounter (HOSPITAL_COMMUNITY): Payer: Self-pay | Admitting: Emergency Medicine

## 2019-03-28 DIAGNOSIS — E119 Type 2 diabetes mellitus without complications: Secondary | ICD-10-CM | POA: Insufficient documentation

## 2019-03-28 DIAGNOSIS — R0602 Shortness of breath: Secondary | ICD-10-CM | POA: Diagnosis present

## 2019-03-28 DIAGNOSIS — I10 Essential (primary) hypertension: Secondary | ICD-10-CM | POA: Diagnosis not present

## 2019-03-28 DIAGNOSIS — Z79899 Other long term (current) drug therapy: Secondary | ICD-10-CM | POA: Insufficient documentation

## 2019-03-28 DIAGNOSIS — R059 Cough, unspecified: Secondary | ICD-10-CM

## 2019-03-28 DIAGNOSIS — U071 COVID-19: Secondary | ICD-10-CM

## 2019-03-28 DIAGNOSIS — Z794 Long term (current) use of insulin: Secondary | ICD-10-CM | POA: Insufficient documentation

## 2019-03-28 DIAGNOSIS — J4521 Mild intermittent asthma with (acute) exacerbation: Secondary | ICD-10-CM | POA: Diagnosis not present

## 2019-03-28 DIAGNOSIS — R05 Cough: Secondary | ICD-10-CM

## 2019-03-28 HISTORY — DX: COVID-19: U07.1

## 2019-03-28 MED ORDER — PREDNISONE 20 MG PO TABS: 60.0000 mg | ORAL_TABLET | Freq: Every day | ORAL | 0 refills | Status: AC

## 2019-03-28 MED ORDER — PREDNISONE 20 MG PO TABS
60.0000 mg | ORAL_TABLET | Freq: Once | ORAL | Status: AC
  Administered 2019-03-28: 60 mg via ORAL
  Filled 2019-03-28: qty 3

## 2019-03-28 MED ORDER — ALBUTEROL SULFATE HFA 108 (90 BASE) MCG/ACT IN AERS
6.0000 | INHALATION_SPRAY | Freq: Once | RESPIRATORY_TRACT | Status: AC
  Administered 2019-03-28: 20:00:00 6 via RESPIRATORY_TRACT
  Filled 2019-03-28: qty 6.7

## 2019-03-28 MED ORDER — BENZONATATE 100 MG PO CAPS: 100.0000 mg | ORAL_CAPSULE | Freq: Three times a day (TID) | ORAL | 0 refills | Status: DC

## 2019-03-28 NOTE — ED Provider Notes (Signed)
[facility] Provider Note   CSN: [id number] Arrival date & time: 03/28/19  1839   History   Chief Complaint Chief Complaint  Patient presents with   COVID +   Shortness of Breath   HPI [facility] is a 43 y.o. female with past medical history asthma, diabetes, COVID-19 infection presents for evaluation of multiple complaints.  Patient states she tested positive for COVID-19 on Thursday, 3 days PTA.  Patient states she has been running intermittent fevers at home has had chills, body aches, generalized headache, nonproductive cough, fatigue, loss of taste and smell as well as runny nose.  Patient states she is also had some shortness of breath.  She does have history of asthma and states this typically worsens when she has a viral upper respiratory infections.  She has been using her nebulizers at home.  She has not been using her rescue inhaler.  Patient states that she typically feels like that she has to start on steroids.  She has never been admitted to the hospital or need intubation for asthma previously.  Denies sudden onset thunderclap headache, vision changes, neck pain, neck stiffness, chest pain, hemoptysis, abdominal pain, diarrhea, dysuria, weakness.  Patient states this morning she did cough and she felt a sharp pain to her right upper back however this resolved.  She no longer has this pain.  She denies any chest pain radiating to her back, leg swelling, history of PE, DVT, recent travel, surgery, immobilization.  She has been taking Tylenol intermittently for her fever at home.  She has not been taking anything for cough.  History of pain from patient and past medical records.  No interpreter is used.     HPI  Past Medical History:  Diagnosis Date   Allergy    Foods:  mango, honey, corn.  Allergic to "everything tested"  15 years ago in [place] age 25 yo   Triggers:  cold weather, exercise, allergens, anxiety, URIs    Cholelithiasis age 72 yo   Asymptomatic   COVID-19    Diabetes mellitus 2012   Was diagnosed 2 years earlier with PCOS   Elevated liver enzymes 09/13/2016   History of kidney stones    Hypertension 2015   Kidney stones    family history of renal failure   Migraines 2009   Morbid obesity ([place])    PCOS (polycystic ovarian syndrome) 2010    Patient Active Problem List   Diagnosis Date Noted   Hemoglobin A1C greater than 9%, indicating poor diabetic control 03/23/2019   Hyperglycemia 03/23/2019   Class 3 severe obesity due to excess calories with serious comorbidity and body mass index (BMI) of 40.0 to 44.9 in adult ([place]) 03/23/2019   Yeast infection 03/23/2019   BMI 40.0-44.9, adult ([place]) 03/09/2019   Cervical dysplasia 07/31/2017   Alopecia of scalp 05/09/2017   Pain in left toe(s) 12/27/2016   Closed avulsion fracture of lateral malleolus of right fibula 12/27/2016   Morbid obesity ([place]) 09/13/2016   Elevated liver enzymes 09/13/2016   Allergy    Asthma    Hypertension 04/23/2013   Type 2 diabetes mellitus with hyperglycemia, with long-term current use of insulin ([place]) 04/23/2010   PCOS (polycystic ovarian syndrome) 04/23/2008   Migraines 04/24/2007    Past Surgical History:  Procedure Laterality Date   COLPOSCOPY N/A    LAPAROSCOPIC TUBAL LIGATION Bilateral 01/28/2019   Procedure: LAPAROSCOPIC TUBAL LIGATION AND PAP SMEAR;  Surgeon: Aletha Halim,  MD;  Location: [facility];  Service: Gynecology;  Laterality: Bilateral;   TONSILLECTOMY AND ADENOIDECTOMY Bilateral 12/23/2018   Procedure: TONSILLECTOMY AND ADENOIDECTOMY;  Surgeon: Leta Baptist, MD;  Location: [facility];  Service: ENT;  Laterality: Bilateral;     OB History    Gravida  4   Para  1   Term  1   Preterm      AB  3   Living  1     SAB  3   TAB      Ectopic      Multiple      Live Births               Home Medications    Prior  to Admission medications   Medication Sig Start Date End Date Taking? Authorizing Provider  ADVAIR DISKUS 100-50 MCG/DOSE AEPB INHALE 1 PUFF INTO THE LUNGS EVERY 12 HOURS. Patient taking differently: Inhale 1 puff into the lungs every 12 (twelve) hours.  02/27/19  Yes Tresa Garter, MD  albuterol (PROVENTIL) (2.5 MG/3ML) 0.083% nebulizer solution Take 3 mLs (2.5 mg total) by nebulization every 6 (six) hours as needed for wheezing or shortness of breath. 03/27/19  Yes Azzie Glatter, FNP  amitriptyline (ELAVIL) 25 MG tablet TAKE 1 TABLET BY MOUTH NIGHTLY AT BEDTIME Patient taking differently: Take 25 mg by mouth at bedtime.  03/18/19  Yes Tresa Garter, MD  cetirizine (ZYRTEC) 10 MG tablet Take 1 tablet (10 mg total) by mouth daily. Patient taking differently: Take 10 mg by mouth every morning.  04/08/17  Yes Hollis, Asencion Partridge, FNP  DEXILANT 30 MG capsule TAKE 1 CAPSULE (30MG  TOTAL) BY MOUTH DAILY. TAKE ON AN EMPTY STOMACH. Patient taking differently: Take 30 mg by mouth every morning.  01/19/19  Yes Jegede, Marlena Clipper, MD  EDARBYCLOR 40-25 MG TABS TAKE 1 TABLET BY MOUTH DAILY. Patient taking differently: Take 1 tablet by mouth every morning.  02/16/19  Yes Tresa Garter, MD  glipiZIDE (GLUCOTROL) 10 MG tablet Take 1 tablet (10 mg total) by mouth 2 (two) times daily before a meal. 03/23/19  Yes Azzie Glatter, FNP  ibuprofen (ADVIL) 200 MG tablet Take 400 mg by mouth every 6 (six) hours as needed for fever or headache (pain).   Yes [provider]  insulin aspart (NOVOLOG FLEXPEN) 100 UNIT/ML FlexPen Inject 10-14 Units into the skin 3 (three) times daily before meals. 05/07/18  Yes Lanae Boast, FNP  Insulin Glargine (LANTUS SOLOSTAR) 100 UNIT/ML Solostar Pen Inject 60 units at bedtime. Patient taking differently: Inject 60 Units into the skin at bedtime.  06/05/18  Yes Philemon Kingdom, MD  MAGNESIUM CITRATE PO Take 1 capsule by mouth every morning.    Yes  [provider]  meloxicam (MOBIC) 15 MG tablet Take 1 tablet (15 mg total) by mouth daily. Patient taking differently: Take 15 mg by mouth daily as needed for pain.  12/18/18  Yes Lanae Boast, FNP  metFORMIN (GLUCOPHAGE) 1000 MG tablet Take 1,000 mg by mouth at bedtime. 02/02/19  Yes [provider]  Psyllium (FIBER) 0.52 g CAPS Take 1 capsule by mouth at bedtime.    Yes [provider]  rizatriptan (MAXALT-MLT) 10 MG disintegrating tablet Take 1 tablet (10 mg total) by mouth as needed for migraine. May repeat in 2 hours if needed.  Max 30 mg/24 h Patient taking differently: Take 10 mg by mouth See admin instructions. Take one tablet (10 mg)  by mouth at onset of migraine headache, may repeat in 2 hours if still needed.  Max 3 tablets (30 mg) in 24 hours 09/24/18  Yes Lanae Boast, FNP  VENTOLIN HFA 108 (90 Base) MCG/ACT inhaler INHALE 2 PUFFS INTO THE LUNGS EVERY 4 HOURS AS NEEDED FOR WHEEZING ORSHORTNESS OF BREATH. Patient taking differently: Inhale 2 puffs into the lungs every 4 (four) hours as needed for wheezing or shortness of breath.  06/25/18  Yes Lanae Boast, FNP  EASY COMFORT PEN NEEDLES 32G X 4 MM MISC use as directed test 2 times daily 05/14/17   [provider]  fluconazole (DIFLUCAN) 150 MG tablet Take 150 mg by mouth once. 03/23/19   [provider]  ibuprofen (ADVIL,MOTRIN) 600 MG tablet Take 1 tablet (600 mg total) by mouth every 6 (six) hours as needed. Patient not taking: Reported on 03/28/2019 03/15/18   Charlesetta Shanks, MD  Insulin Pen Needle (PEN NEEDLES) 32G X 5 MM MISC 100 each by Does not apply route 2 (two) times daily. 05/14/17   Dorena Dew, FNP  metFORMIN (GLUCOPHAGE) 500 MG tablet Take 1 tablet (500 mg total) by mouth 2 (two) times daily with a meal. 11/22/17 03/23/19  Lanae Boast, FNP  montelukast (SINGULAIR) 10 MG tablet Take 1 tablet (10 mg total) by mouth daily. Patient not taking: Reported on 03/28/2019 11/22/17    Lanae Boast, FNP  predniSONE (DELTASONE) 20 MG tablet Take 3 tablets (60 mg total) by mouth daily for 5 days. 03/28/19 04/02/19  Kimarie Coor A, PA-C    Family History Family History  Problem Relation Age of Onset   Diabetes Father    Hyperlipidemia Father    Hypertension Father    Heart disease Father    Kidney disease Father        Developed after 2nd CABG   Diabetes Paternal Grandfather    Heart disease Paternal Grandfather    Hyperlipidemia Paternal Grandfather    Hypertension Paternal Grandfather    Kidney disease Paternal Grandfather     Social History Social History   Tobacco Use   Smoking status: Never Smoker   Smokeless tobacco: Never Used  Substance Use Topics   Alcohol use: No   Drug use: No     Allergies   Corn-containing products, Honey, Mango flavor, and Victoza [liraglutide]   Review of Systems Review of Systems  Constitutional: Positive for activity change, appetite change, chills, fatigue and fever.  HENT: Positive for congestion and rhinorrhea. Negative for sinus pressure, sinus pain, sore throat, trouble swallowing and voice change.   Eyes: Negative.   Respiratory: Positive for cough, shortness of breath and wheezing. Negative for apnea, choking, chest tightness and stridor.   Cardiovascular: Negative.   Gastrointestinal: Negative.   Genitourinary: Negative.   Musculoskeletal: Negative.   Skin: Negative.   Neurological: Positive for headaches (Generalized). Negative for dizziness, tremors, syncope, facial asymmetry, speech difficulty, weakness, light-headedness and numbness.  All other systems reviewed and are negative.    Physical Exam Updated Vital Signs BP 117/88 (BP Location: Right Arm)    Pulse 91    Temp 98.8 F (37.1 C) (Oral)    Resp 18    LMP 03/08/2019    SpO2 98%   Physical Exam Vitals signs and nursing note reviewed.  Constitutional:      General: She is not in acute distress.    Appearance: She is not  ill-appearing, toxic-appearing or diaphoretic.  HENT:     Head: Normocephalic and atraumatic.  Jaw: There is normal jaw occlusion.     Right Ear: Tympanic membrane, ear canal and external ear normal. There is no impacted cerumen. No hemotympanum. Tympanic membrane is not injected, scarred, perforated, erythematous, retracted or bulging.     Left Ear: Tympanic membrane, ear canal and external ear normal. There is no impacted cerumen. No hemotympanum. Tympanic membrane is not injected, scarred, perforated, erythematous, retracted or bulging.     Ears:     Comments: No Mastoid tenderness.    Nose:     Comments: Clear rhinorrhea and congestion to bilateral nares.  No sinus tenderness.    Mouth/Throat:     Comments: Posterior oropharynx clear.  Mucous membranes moist.  Tonsils without erythema or exudate.  Uvula midline without deviation.  No evidence of PTA or RPA.  No drooling, dysphasia or trismus.  Phonation normal. Neck:     Trachea: Trachea and phonation normal.     Meningeal: Brudzinski's sign and Kernig's sign absent.     Comments: No Neck stiffness or neck rigidity.  No meningismus.  No cervical lymphadenopathy. Cardiovascular:     Comments: No murmurs rubs or gallops. Pulmonary:     Comments: Diffuse wheeze throughout. No accessory muscle usage.  Able speak in full sentences without difficulty Abdominal:     Comments: Soft, nontender without rebound or guarding.  No CVA tenderness.  Musculoskeletal:     Comments: Moves all 4 extremities without difficulty.  Lower extremities without edema, erythema or warmth.  Skin:    Comments: Brisk capillary refill.  No rashes or lesions.  Neurological:     Mental Status: She is alert.     Comments: Ambulatory in department without difficulty.  Cranial nerves II through XII grossly intact.  No facial droop.  No aphasia.    ED Treatments / Results  Labs (all labs ordered are listed, but only abnormal results are displayed) Labs Reviewed -  No data to display  EKG None  Radiology Dg Chest Portable 1 View  Result Date: 03/28/2019 CLINICAL DATA:  43 year old female with shortness of breath. EXAM: PORTABLE CHEST 1 VIEW COMPARISON:  Chest radiograph dated 08/19/2017. FINDINGS: The lungs are clear. There is no pleural effusion pneumothorax. Probable small calcified granuloma at the right lung base. The cardiac silhouette is within normal limits. No acute osseous pathology. IMPRESSION: No active disease. Electronically Signed   By: Anner Crete M.D.   On: 03/28/2019 19:18    Procedures Procedures (including critical care time)  Medications Ordered in ED Medications  albuterol (VENTOLIN HFA) 108 (90 Base) MCG/ACT inhaler 6 puff (6 puffs Inhalation Given 03/28/19 1943)  predniSONE (DELTASONE) tablet 60 mg (60 mg Oral Given 03/28/19 1943)    Initial Impression / Assessment and Plan / ED Course  I have reviewed the triage vital signs and the nursing notes.  Pertinent labs & imaging results that were available during my care of the patient were reviewed by me and considered in my medical decision making (see chart for details).  43 year old female appears otherwise well presents for evaluation of multiple complaints.  She has known Covid positive.  She is afebrile, nonseptic, not ill-appearing.  Patient with generalized headache.  Denies sudden onset thunderclap headache.  She has a nonfocal neurologic exam without deficits.  No dizziness, lightheadedness, vision changes.  No neck stiffness or neck rigidity.  No meningismus.  Patient denies any chest pain, hemoptysis.  No evidence of DVT on exam.  No prior history of DVT.  Was mildly tachycardic on initial  triage evaluation to 107 however she is not tachycardic on my initial evaluation with heart rate in the 90s.  She is not tachypneic or hypoxic.  Heart clear.  She does have diffuse expiratory wheeze.  She is known asthmatic however does not need hospitalization for this previously.   Has been using her home nebulizers.  She did have an episode of sharp right upper thoracic back pain earlier today however has none currently.  She denies any chest pain, hemoptysis, specifically no pleuritic chest pain.  I have low suspicion for PE, ACS, myocarditis or pericarditis.  She does certainly have a Covid symptoms.  Given she has expiratory wheezing we will give her a dose of an albuterol inhaler as well as steroids.    Chest x-ray obtained from triage is not any evidence of infiltrates, cardiomegaly, pulmonary edema or pneumothorax.   EKG with sinus rhythm without ST/T changes.  No STEMI.  Patient reassess after steroids and albuterol. Lungs with clear to auscultation. Patient able to ambulate with oxygen greater than 98 % on RA.  Shortness of breath related to asthma exacerbation versus Covid virus however viral infection can certainly cause an observation of her asthma.  I have low suspicion again for PE as cause of her shortness of breath.  She is not tachycardic, tachypneic or hypoxic.  Will DC home on short burst of steroids, rescue inhaler. Pt states they are breathing at baseline. Pt has been instructed to continue using prescribed medications and to speak with PCP about today's exacerbation.  Discussed strict return precautions with patient.  Patient voiced understanding and is agreeable for follow-up.   The patient has been appropriately medically screened and/or stabilized in the ED. I have low suspicion for any other emergent medical condition which would require further screening, evaluation or treatment in the ED or require inpatient management.  Patient is hemodynamically stable and in no acute distress.  Patient able to ambulate in department prior to ED.  Evaluation does not show acute pathology that would require ongoing or additional emergent interventions while in the emergency department or further inpatient treatment.  I have discussed the diagnosis with the patient and  answered all questions.  Pain is been managed while in the emergency department and patient has no further complaints prior to discharge.  Patient is comfortable with plan discussed in room and is stable for discharge at this time.  I have discussed strict return precautions for returning to the emergency department.  Patient was encouraged to follow-up with PCP/specialist refer to at discharge.     [facility] was evaluated in Emergency Department on 03/28/2019 for the symptoms described in the history of present illness. She was evaluated in the context of the global COVID-19 pandemic, which necessitated consideration that the patient might be at risk for infection with the SARS-CoV-2 virus that causes COVID-19. Institutional protocols and algorithms that pertain to the evaluation of patients at risk for COVID-19 are in a state of rapid change based on information released by regulatory bodies including the CDC and federal and state organizations. These policies and algorithms were followed during the patient's care in the ED. Final Clinical Impressions(s) / ED Diagnoses   Final diagnoses:  COVID-19  Cough  Mild intermittent asthma with exacerbation    ED Discharge Orders         Ordered    predniSONE (DELTASONE) 20 MG tablet  Daily     03/28/19 2102  Zarriah Starkel A, PA-C 03/28/19 2104    Elnora Morrison, MD 03/28/19 2348

## 2019-03-28 NOTE — Telephone Encounter (Signed)
Patient recently diagnosed with COVID19 on Monday, 03/23/2019. She calls today with increased symptoms of congestion, shortness of breath, cough, and general fatigue. She states that her daughter is also developing symptoms. Patient referred to ED for further assessment and evaluation. She is to contact our office on Monday as needed. Patient verbalized understanding.    Kathe Becton,  MSN, FNP-BC [facility] [address] [place], [place] [address] [phone] fax

## 2019-03-28 NOTE — ED Triage Notes (Signed)
Pt COVID + / tested on Thursday.  C/o fever, chills, body aches, headache, cough, SOB, fatigue, loss of smell, and runny nose.  Reports pain to back when she takes a deep breath.

## 2019-03-28 NOTE — ED Notes (Signed)
Patient verbalizes understanding of discharge instructions. Opportunity for questioning and answers were provided. Armband removed by staff, pt discharged from ED.  

## 2019-03-28 NOTE — Discharge Instructions (Signed)
Take the steroids as prescribed.  Continue to use your home nebulizers and rescue inhaler.  If you develop chest pain, coughing up blood, worsening shortness of breath, swelling to your legs please seek reevaluation the emergency department

## 2019-04-06 ENCOUNTER — Telehealth: Payer: Self-pay | Admitting: Family Medicine

## 2019-04-06 ENCOUNTER — Other Ambulatory Visit: Payer: Self-pay | Admitting: Family Medicine

## 2019-04-06 DIAGNOSIS — R0989 Other specified symptoms and signs involving the circulatory and respiratory systems: Secondary | ICD-10-CM

## 2019-04-06 MED ORDER — DOXYCYCLINE HYCLATE 100 MG PO TABS: 100.0000 mg | ORAL_TABLET | Freq: Two times a day (BID) | ORAL | 0 refills | Status: DC

## 2019-04-07 ENCOUNTER — Other Ambulatory Visit: Payer: Self-pay | Admitting: Family Medicine

## 2019-04-08 ENCOUNTER — Other Ambulatory Visit: Payer: Self-pay | Admitting: Family Medicine

## 2019-04-08 DIAGNOSIS — IMO0001 Reserved for inherently not codable concepts without codable children: Secondary | ICD-10-CM

## 2019-04-08 DIAGNOSIS — Z794 Long term (current) use of insulin: Secondary | ICD-10-CM

## 2019-04-08 DIAGNOSIS — E119 Type 2 diabetes mellitus without complications: Secondary | ICD-10-CM

## 2019-04-08 DIAGNOSIS — J4520 Mild intermittent asthma, uncomplicated: Secondary | ICD-10-CM

## 2019-04-08 MED ORDER — METFORMIN HCL 500 MG PO TABS: 500.0000 mg | ORAL_TABLET | Freq: Two times a day (BID) | ORAL | 5 refills | Status: DC

## 2019-04-08 MED ORDER — ALBUTEROL SULFATE (2.5 MG/3ML) 0.083% IN NEBU: 2.5000 mg | INHALATION_SOLUTION | Freq: Four times a day (QID) | RESPIRATORY_TRACT | 6 refills | Status: DC | PRN

## 2019-04-08 NOTE — Telephone Encounter (Signed)
done

## 2019-04-09 ENCOUNTER — Other Ambulatory Visit: Payer: Self-pay | Admitting: Family Medicine

## 2019-04-13 ENCOUNTER — Other Ambulatory Visit: Payer: Self-pay | Admitting: Family Medicine

## 2019-04-13 DIAGNOSIS — E119 Type 2 diabetes mellitus without complications: Secondary | ICD-10-CM

## 2019-04-13 DIAGNOSIS — Z794 Long term (current) use of insulin: Secondary | ICD-10-CM

## 2019-04-13 MED ORDER — METFORMIN HCL 500 MG PO TABS: 500.0000 mg | ORAL_TABLET | Freq: Two times a day (BID) | ORAL | 5 refills | Status: DC

## 2019-04-22 ENCOUNTER — Ambulatory Visit: Payer: Medicaid Other | Attending: Internal Medicine

## 2019-04-22 DIAGNOSIS — Z20822 Contact with and (suspected) exposure to covid-19: Secondary | ICD-10-CM

## 2019-04-23 LAB — NOVEL CORONAVIRUS, NAA: SARS-CoV-2, NAA: NOT DETECTED

## 2019-04-24 HISTORY — PX: COLPOSCOPY: SHX161

## 2019-04-29 ENCOUNTER — Ambulatory Visit (INDEPENDENT_AMBULATORY_CARE_PROVIDER_SITE_OTHER): Payer: Self-pay | Admitting: Obstetrics and Gynecology

## 2019-04-29 ENCOUNTER — Other Ambulatory Visit: Payer: Self-pay

## 2019-04-29 ENCOUNTER — Encounter: Payer: Self-pay | Admitting: Obstetrics and Gynecology

## 2019-04-29 ENCOUNTER — Other Ambulatory Visit (HOSPITAL_COMMUNITY)
Admission: RE | Admit: 2019-04-29 | Discharge: 2019-04-29 | Disposition: A | Discharge status: Home / Self Care | Payer: Medicaid Other | Source: Ambulatory Visit | Attending: Obstetrics and Gynecology | Admitting: Obstetrics and Gynecology

## 2019-04-29 DIAGNOSIS — N870 Mild cervical dysplasia: Secondary | ICD-10-CM

## 2019-04-29 DIAGNOSIS — N879 Dysplasia of cervix uteri, unspecified: Secondary | ICD-10-CM | POA: Diagnosis not present

## 2019-04-29 LAB — POCT PREGNANCY, URINE: Preg Test, Ur: NEGATIVE

## 2019-04-29 MED ORDER — FLUCONAZOLE 150 MG PO TABS: 150.0000 mg | ORAL_TABLET | ORAL | 1 refills | Status: DC

## 2019-04-29 MED ORDER — METRONIDAZOLE 500 MG PO TABS: 500.0000 mg | ORAL_TABLET | Freq: Two times a day (BID) | ORAL | 1 refills | Status: AC

## 2019-04-29 NOTE — Procedures (Signed)
Colposcopy Procedure Note  Pre-operative Diagnosis:  01/2019 pap: ascus/hpv pos on 01/2019 with some cells suggestive of high grade.  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    The risks (including infection, bleeding, pain) and benefits of the procedure were explained to the patient and written informed consent was obtained.  The patient was placed in the dorsal lithotomy position. A Graves was speculum inserted in the vagina, and the cervix was visualized.  AA staining done Lugol's with green filter.  Biopsy from 10 and 6 o'clock and then single toothed tenaculum applied and ECC in all four quadrants done. No bleeding after procedure with application of silver nitrate  Findings: increased vascularity, mosacism from 9 to 12 o'clock, and AWE changes on the posterior lip of the cervix  EGBUS with erythema on b/l labia majora and cottage cheese like d/c in vaginal vault.   Adequate: Yes  Specimens: 10 and 6 o'clock and ECC  Condition: Stable  Complications: None  Plan: The patient was advised to call for any fever or for prolonged or severe pain or bleeding. She was advised to use OTC analgesics as needed for mild to moderate pain. She was advised to avoid vaginal intercourse for 5 days  Aletha Halim, Brooke Bonito MD Attending Center for [employer])

## 2019-04-30 LAB — SURGICAL PATHOLOGY

## 2019-05-05 ENCOUNTER — Other Ambulatory Visit: Payer: Self-pay | Admitting: Internal Medicine

## 2019-05-05 DIAGNOSIS — K219 Gastro-esophageal reflux disease without esophagitis: Secondary | ICD-10-CM

## 2019-05-05 DIAGNOSIS — J4530 Mild persistent asthma, uncomplicated: Secondary | ICD-10-CM

## 2019-05-05 DIAGNOSIS — I10 Essential (primary) hypertension: Secondary | ICD-10-CM

## 2019-05-05 NOTE — Telephone Encounter (Signed)
Please fill or advise on behalf of previous provider.

## 2019-05-05 NOTE — Telephone Encounter (Signed)
Patient needs follow up appointment with new provider.

## 2019-05-13 ENCOUNTER — Other Ambulatory Visit: Payer: Self-pay | Admitting: Internal Medicine

## 2019-05-13 ENCOUNTER — Other Ambulatory Visit: Payer: Self-pay | Admitting: Family Medicine

## 2019-05-13 NOTE — Telephone Encounter (Signed)
Last office visit 04/25/2018  Cancel/No-show? 2  Future office visit scheduled? no  Please advise on refill.

## 2019-05-13 NOTE — Telephone Encounter (Signed)
She was lost to follow-up for me.  Refills per PCP.

## 2019-05-15 ENCOUNTER — Other Ambulatory Visit: Payer: Self-pay

## 2019-05-15 MED ORDER — NOVOLOG FLEXPEN 100 UNIT/ML ~~LOC~~ SOPN: 10.0000 [IU] | PEN_INJECTOR | Freq: Three times a day (TID) | SUBCUTANEOUS | 5 refills | Status: DC

## 2019-05-19 ENCOUNTER — Encounter: Payer: Self-pay | Admitting: General Practice

## 2019-05-19 ENCOUNTER — Encounter: Payer: Self-pay | Admitting: Family Medicine

## 2019-06-14 ENCOUNTER — Other Ambulatory Visit: Payer: Self-pay | Admitting: Family Medicine

## 2019-06-29 ENCOUNTER — Other Ambulatory Visit: Payer: Self-pay | Admitting: Internal Medicine

## 2019-06-30 ENCOUNTER — Telehealth: Payer: Self-pay | Admitting: Nurse Practitioner

## 2019-06-30 ENCOUNTER — Other Ambulatory Visit: Payer: Self-pay

## 2019-06-30 MED ORDER — NOVOLOG FLEXPEN 100 UNIT/ML ~~LOC~~ SOPN: 10.0000 [IU] | PEN_INJECTOR | Freq: Three times a day (TID) | SUBCUTANEOUS | 5 refills | Status: DC

## 2019-06-30 MED ORDER — LANTUS SOLOSTAR 100 UNIT/ML ~~LOC~~ SOPN: PEN_INJECTOR | SUBCUTANEOUS | 11 refills | Status: DC

## 2019-06-30 NOTE — Telephone Encounter (Signed)
Refill for insulin sent to pharmacy. Thanks!

## 2019-07-08 ENCOUNTER — Other Ambulatory Visit: Payer: Self-pay | Admitting: Family Medicine

## 2019-07-08 ENCOUNTER — Telehealth: Payer: Medicaid Other | Admitting: Nurse Practitioner

## 2019-07-08 DIAGNOSIS — I10 Essential (primary) hypertension: Secondary | ICD-10-CM

## 2019-07-08 DIAGNOSIS — J4521 Mild intermittent asthma with (acute) exacerbation: Secondary | ICD-10-CM

## 2019-07-08 DIAGNOSIS — J069 Acute upper respiratory infection, unspecified: Secondary | ICD-10-CM

## 2019-07-08 DIAGNOSIS — J4530 Mild persistent asthma, uncomplicated: Secondary | ICD-10-CM

## 2019-07-08 MED ORDER — BENZONATATE 100 MG PO CAPS: 100.0000 mg | ORAL_CAPSULE | Freq: Three times a day (TID) | ORAL | 0 refills | Status: DC | PRN

## 2019-07-08 MED ORDER — PREDNISONE 20 MG PO TABS: ORAL_TABLET | ORAL | 0 refills | Status: DC

## 2019-07-08 MED ORDER — FLUTICASONE PROPIONATE 50 MCG/ACT NA SUSP: 2.0000 | Freq: Every day | NASAL | 6 refills | Status: DC

## 2019-07-08 NOTE — Progress Notes (Signed)
We are sorry you are not feeling well.  Here is how we plan to help!  Based on what you have shared with me, it looks like you may have a viral upper respiratory infection.  Upper respiratory infections are caused by a large number of viruses; however, rhinovirus is the most common cause.   Symptoms vary from person to person, with common symptoms including sore throat, cough, fatigue or lack of energy and feeling of general discomfort.  A low-grade fever of up to 100.4 may present, but is often uncommon.  Symptoms vary however, and are closely related to a person's age or underlying illnesses.  The most common symptoms associated with an upper respiratory infection are nasal discharge or congestion, cough, sneezing, headache and pressure in the ears and face.  These symptoms usually persist for about 3 to 10 days, but can last up to 2 weeks.  It is important to know that upper respiratory infections do not cause serious illness or complications in most cases.    Upper respiratory infections can be transmitted from person to person, with the most common method of transmission being a person's hands.  The virus is able to live on the skin and can infect other persons for up to 2 hours after direct contact.  Also, these can be transmitted when someone coughs or sneezes; thus, it is important to cover the mouth to reduce this risk.  To keep the spread of the illness at [place], good hand hygiene is very important.  This is an infection that is most likely caused by a virus. There are no specific treatments other than to help you with the symptoms until the infection runs its course.  We are sorry you are not feeling well.  Here is how we plan to help!   For nasal congestion, you may use an oral decongestants such as Mucinex D or if you have glaucoma or high blood pressure use plain Mucinex.  Saline nasal spray or nasal drops can help and can safely be used as often as needed for congestion.  For your congestion,  I have prescribed Fluticasone nasal spray one spray in each nostril twice a day  If you do not have a history of heart disease, hypertension, diabetes or thyroid disease, prostate/bladder issues or glaucoma, you may also use Sudafed to treat nasal congestion.  It is highly recommended that you consult with a pharmacist or your primary care physician to ensure this medication is safe for you to take.     If you have a cough, you may use cough suppressants such as Delsym and Robitussin.  If you have glaucoma or high blood pressure, you can also use Coricidin HBP.   For cough I have prescribed for you A prescription cough medication called Tessalon Perles 100 mg. You may take 1-2 capsules every 8 hours as needed for cough. I have also sent in prednisone 20=mg 2 tablet at sametime daily for 5 days. * use your ventolin inhler as rx.  If you have a sore or scratchy throat, use a saltwater gargle-  to  teaspoon of salt dissolved in a 4-ounce to 8-ounce glass of warm water.  Gargle the solution for approximately 15-30 seconds and then spit.  It is important not to swallow the solution.  You can also use throat lozenges/cough drops and Chloraseptic spray to help with throat pain or discomfort.  Warm or cold liquids can also be helpful in relieving throat pain.  For headache, pain or  general discomfort, you can use Ibuprofen or Tylenol as directed.   Some authorities believe that zinc sprays or the use of Echinacea may shorten the course of your symptoms.   HOME CARE . Only take medications as instructed by your medical team. . Be sure to drink plenty of fluids. Water is fine as well as fruit juices, sodas and electrolyte beverages. You may want to stay away from caffeine or alcohol. If you are nauseated, try taking small sips of liquids. How do you know if you are getting enough fluid? Your urine should be a pale yellow or almost colorless. . Get rest. . Taking a steamy shower or using a humidifier may  help nasal congestion and ease sore throat pain. You can place a towel over your head and breathe in the steam from hot water coming from a faucet. . Using a saline nasal spray works much the same way. . Cough drops, hard candies and sore throat lozenges may ease your cough. . Avoid close contacts especially the very young and the elderly . Cover your mouth if you cough or sneeze . Always remember to wash your hands.   GET HELP RIGHT AWAY IF: . You develop worsening fever. . If your symptoms do not improve within 10 days . You develop yellow or green discharge from your nose over 3 days. . You have coughing fits . You develop a severe head ache or visual changes. . You develop shortness of breath, difficulty breathing or start having chest pain . Your symptoms persist after you have completed your treatment plan  MAKE SURE YOU   Understand these instructions.  Will watch your condition.  Will get help right away if you are not doing well or get worse.  Your e-visit answers were reviewed by a board certified advanced clinical practitioner to complete your personal care plan. Depending upon the condition, your plan could have included both over the counter or prescription medications. Please review your pharmacy choice. If there is a problem, you may call our nursing hot line at and have the prescription routed to another pharmacy. Your safety is important to [place]. If you have drug allergies check your prescription carefully.   You can use MyChart to ask questions about today's visit, request a non-urgent call back, or ask for a work or school excuse for 24 hours related to this e-Visit. If it has been greater than 24 hours you will need to follow up with your provider, or enter a new e-Visit to address those concerns. You will get an e-mail in the next two days asking about your experience.  I hope that your e-visit has been valuable and will speed your recovery. Thank you for using  e-visits.   5-10 minutes spent reviewing and documenting in chart.

## 2019-07-21 ENCOUNTER — Other Ambulatory Visit: Payer: Self-pay | Admitting: Family Medicine

## 2019-07-26 IMAGING — CT CT ABD-PELV W/ CM
2 of 5 series · 16 of 46 positions shown, 18 images · IV contrast (ISOVUE)
Comparison: CT 12/28/2010

CLINICAL DATA: Mid abdominal pain with nausea and vomiting.

EXAM:
CT ABDOMEN AND PELVIS WITH CONTRAST
TECHNIQUE: Multidetector CT imaging of the abdomen and pelvis was performed
using the standard protocol following bolus administration of
intravenous contrast.
CONTRAST:  100mL 5FEANI-GOO IOPAMIDOL (5FEANI-GOO) INJECTION 61%

[Series 2: axial st · axial · 0.75mm/px · z∈[+1154,+1568]mm · 13 of 97 slices shown, 15 images]
[im 7/97  soft-tissue]
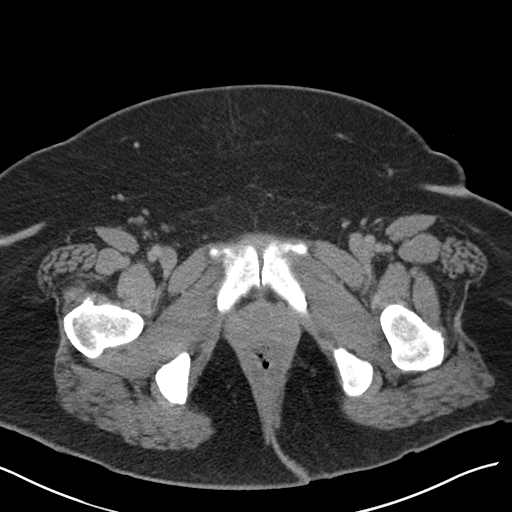
[im 7/97  bone]
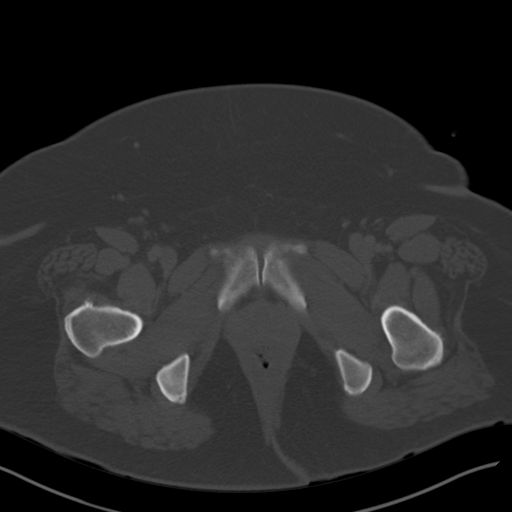
[im 13/97  soft-tissue]
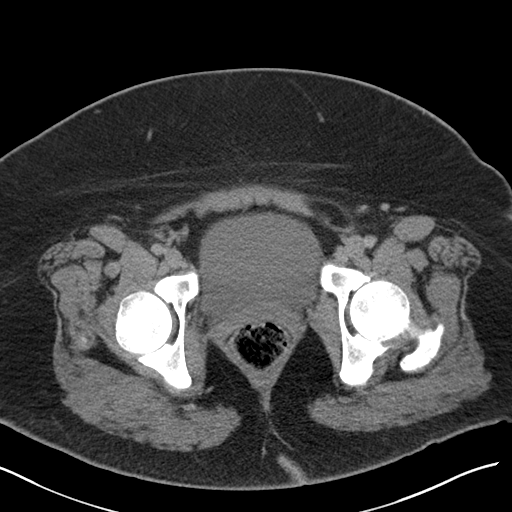
[im 20/97  soft-tissue]
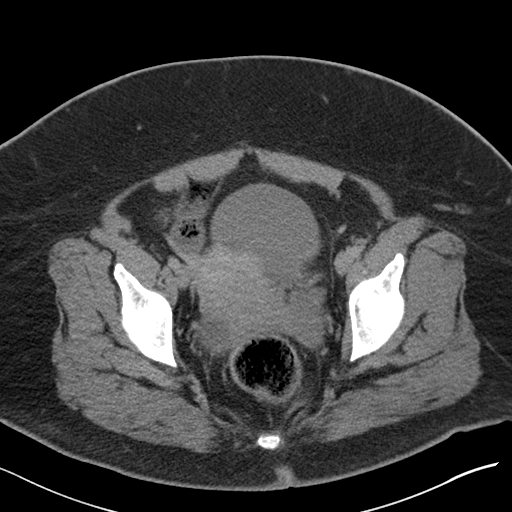
[im 26/97  soft-tissue]
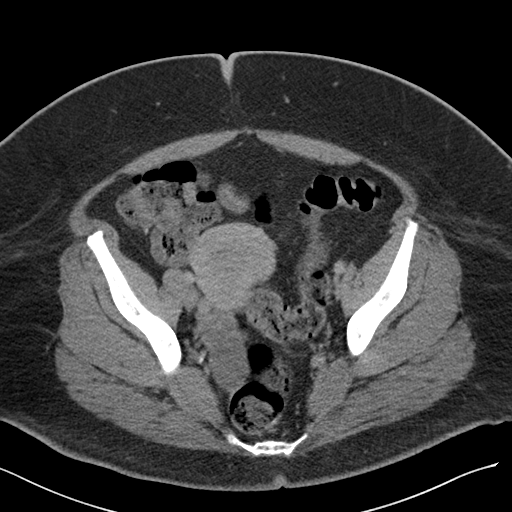
[im 33/97  soft-tissue]
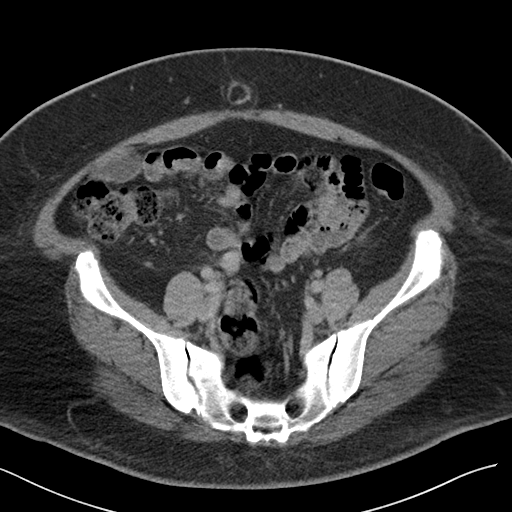
[im 39/97  soft-tissue]
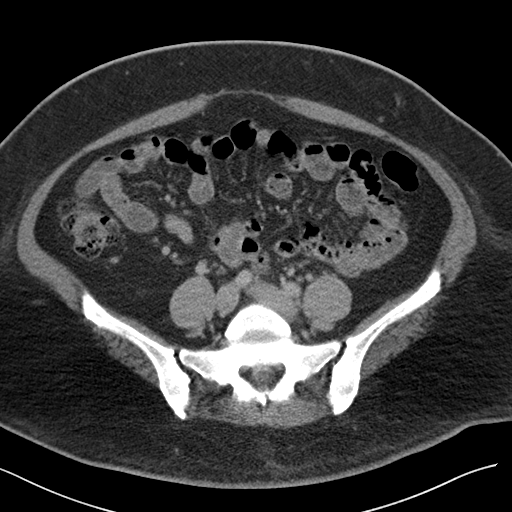
[im 52/97  soft-tissue]
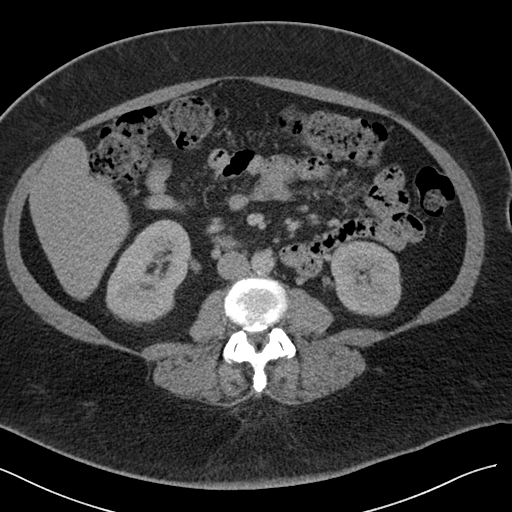
[im 58/97  soft-tissue]
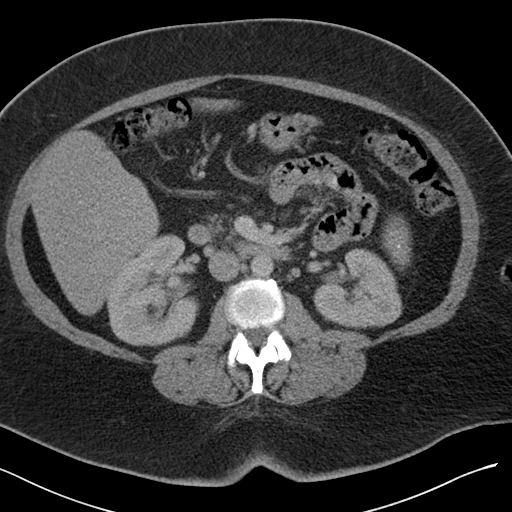
[im 65/97  soft-tissue]
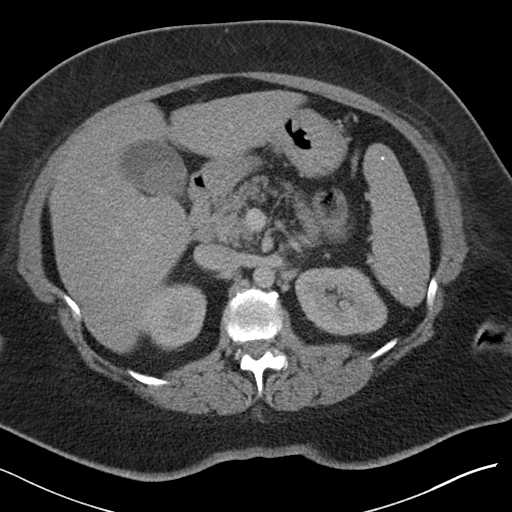
[im 65/97  bone]
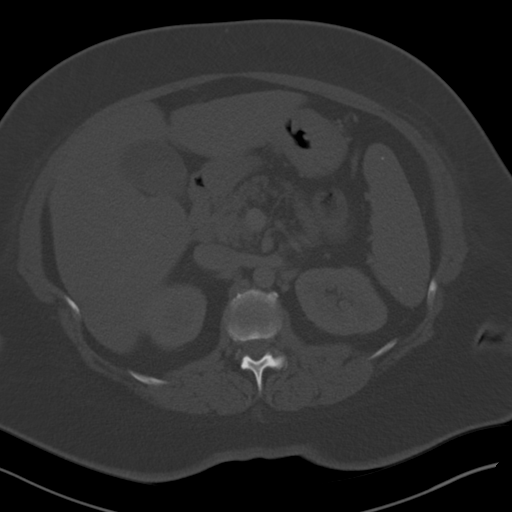
[im 71/97  soft-tissue]
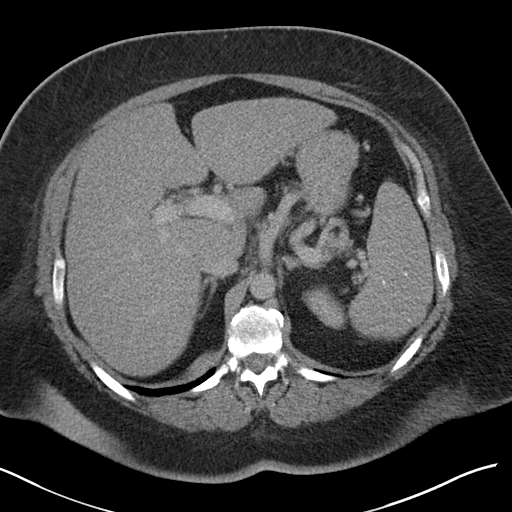
[im 77/97  soft-tissue]
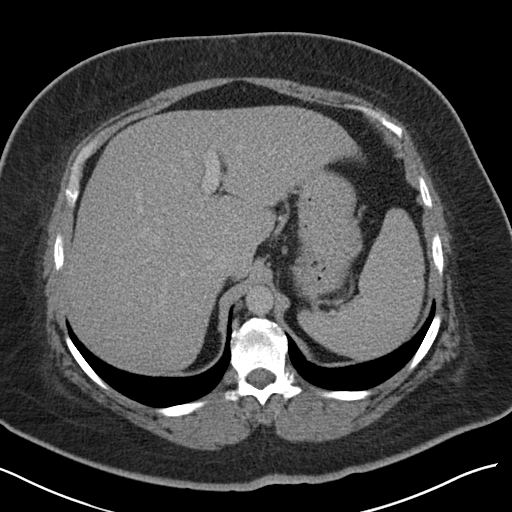
[im 84/97  soft-tissue]
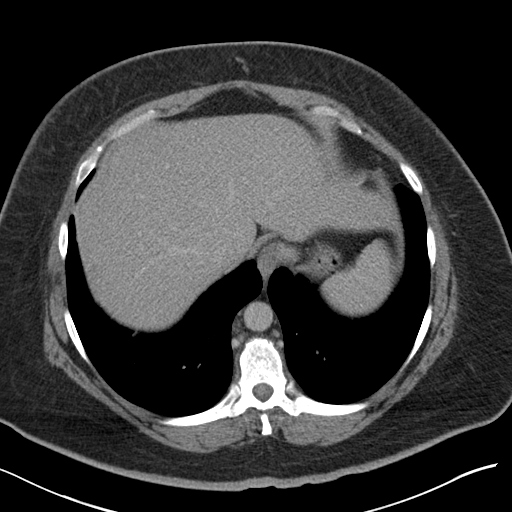
[im 90/97  soft-tissue]
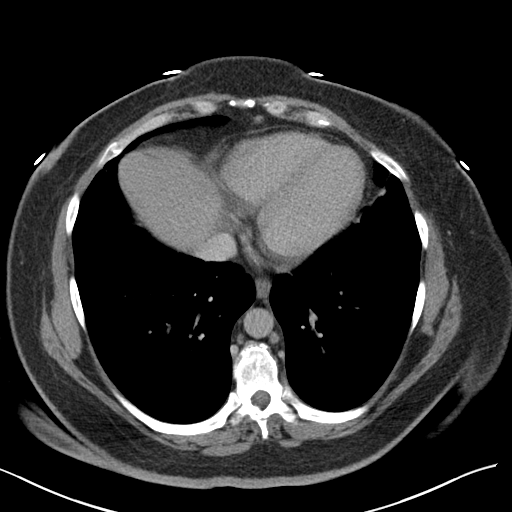

[Series 4: coronal st · coronal · 0.79mm/px · 3 of 101 slices shown]
[im 34/101  soft-tissue]
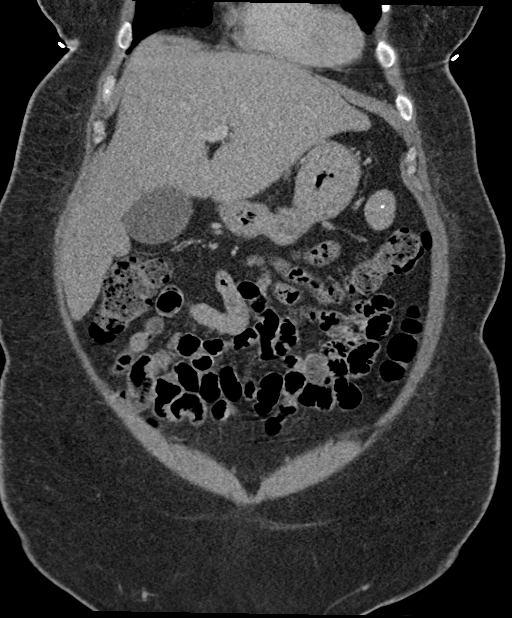
[im 45/101  soft-tissue]
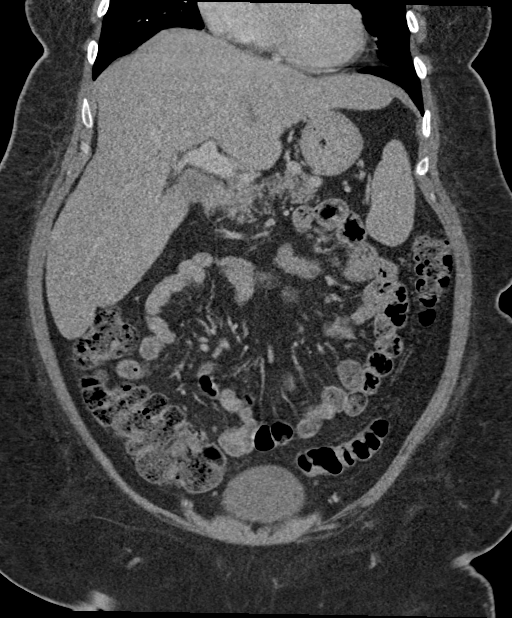
[im 56/101  soft-tissue]
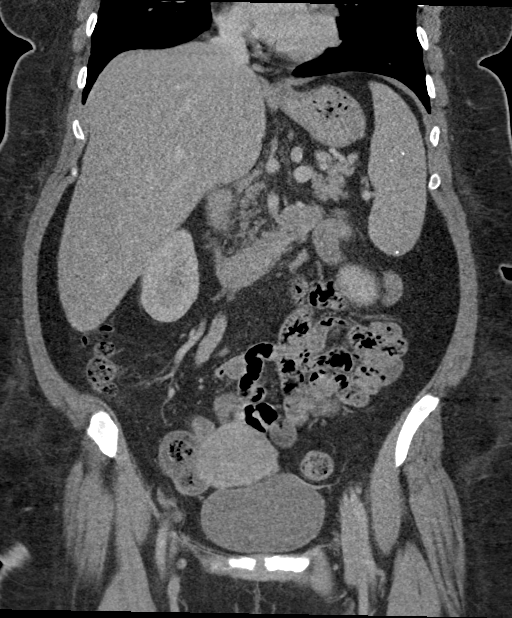

[16 of 46 positions shown; findings below may reference images not displayed]

FINDINGS: Lower chest: Calcified granuloma at the anterior costophrenic angle.

Hepatobiliary: Prominent hepatomegaly with liver spanning 26 cm
cranial caudal. Diffusely decreased density consistent with
steatosis. Gallbladder distention with vague intraluminal density
likely gallstones. No pericholecystic inflammatory change. No
biliary dilatation.

Pancreas: No ductal dilatation or inflammation.

Spleen: Mildly enlarged spanning 14.3 cm. Multiple calcified splenic
granuloma.

Adrenals/Urinary Tract: Normal adrenal glands. No hydronephrosis or
perinephric edema. Homogeneous renal enhancement with symmetric
excretion on delayed phase imaging. Small parapelvic cyst in the
upper right kidney. Punctate nonobstructing stone in the left
kidney. Urinary bladder is physiologically distended without wall
thickening.

Stomach/Bowel: Stomach is nondistended. No small bowel wall
thickening, inflammatory change or obstruction. Normal appendix.
Moderate colonic stool burden throughout the entire colon. No bowel
wall thickening or inflammatory change.

Vascular/Lymphatic: No significant vascular findings are present. No
enlarged abdominal or pelvic lymph nodes.

Reproductive: 3.8 cm cyst in the right ovary, with a 1.7 cm cyst or
follicle. Left ovary is normal in size. Uterus slightly bulbous but
without dominant mass.

Other: Small fat containing umbilical hernia. No free air, free
fluid, or intra-abdominal fluid collection.

Musculoskeletal: There are no acute or suspicious osseous
abnormalities.
IMPRESSION: 1. Mild gallbladder distention with intraluminal low-density,
possible sludge or noncalcified stone. Consider right upper quadrant
ultrasound if there is clinical concern for acute gallbladder
pathology.
2. Hepatosplenomegaly and hepatic steatosis.
3. Nonobstructing left renal stone.
4. Right ovarian cysts largest measuring 3.8 cm. These are
presumably benign, and no dedicated imaging follow-up is needed.

## 2019-08-12 ENCOUNTER — Other Ambulatory Visit: Payer: Self-pay | Admitting: Family Medicine

## 2019-08-12 DIAGNOSIS — K219 Gastro-esophageal reflux disease without esophagitis: Secondary | ICD-10-CM

## 2019-08-24 ENCOUNTER — Encounter (INDEPENDENT_AMBULATORY_CARE_PROVIDER_SITE_OTHER): Payer: Self-pay

## 2019-09-07 ENCOUNTER — Other Ambulatory Visit: Payer: Self-pay | Admitting: Internal Medicine

## 2019-09-07 DIAGNOSIS — G4700 Insomnia, unspecified: Secondary | ICD-10-CM

## 2019-09-18 ENCOUNTER — Ambulatory Visit (INDEPENDENT_AMBULATORY_CARE_PROVIDER_SITE_OTHER): Payer: Medicaid Other | Admitting: Nurse Practitioner

## 2019-09-18 ENCOUNTER — Encounter: Payer: Self-pay | Admitting: Nurse Practitioner

## 2019-09-18 ENCOUNTER — Other Ambulatory Visit: Payer: Self-pay

## 2019-09-18 VITALS — BP 107/72 | HR 90 | Temp 97.6°F | Ht 65.0 in | Wt 265.0 lb

## 2019-09-18 DIAGNOSIS — M545 Low back pain, unspecified: Secondary | ICD-10-CM

## 2019-09-18 DIAGNOSIS — K219 Gastro-esophageal reflux disease without esophagitis: Secondary | ICD-10-CM

## 2019-09-18 DIAGNOSIS — G4700 Insomnia, unspecified: Secondary | ICD-10-CM

## 2019-09-18 DIAGNOSIS — E119 Type 2 diabetes mellitus without complications: Secondary | ICD-10-CM | POA: Diagnosis not present

## 2019-09-18 DIAGNOSIS — J329 Chronic sinusitis, unspecified: Secondary | ICD-10-CM

## 2019-09-18 DIAGNOSIS — I10 Essential (primary) hypertension: Secondary | ICD-10-CM

## 2019-09-18 DIAGNOSIS — J4520 Mild intermittent asthma, uncomplicated: Secondary | ICD-10-CM

## 2019-09-18 DIAGNOSIS — Z794 Long term (current) use of insulin: Secondary | ICD-10-CM

## 2019-09-18 DIAGNOSIS — IMO0001 Reserved for inherently not codable concepts without codable children: Secondary | ICD-10-CM

## 2019-09-18 DIAGNOSIS — J4530 Mild persistent asthma, uncomplicated: Secondary | ICD-10-CM

## 2019-09-18 LAB — POCT URINALYSIS DIP (CLINITEK)
Bilirubin, UA: NEGATIVE
Blood, UA: NEGATIVE
Glucose, UA: 250 mg/dL — AB
Ketones, POC UA: NEGATIVE mg/dL
Nitrite, UA: POSITIVE — AB
POC PROTEIN,UA: NEGATIVE
Spec Grav, UA: >=1.03 — AB (ref 1.010–1.025)
Urobilinogen, UA: 0.2 E.U./dL
pH, UA: 5.5 (ref 5.0–8.0)

## 2019-09-18 LAB — POCT GLYCOSYLATED HEMOGLOBIN (HGB A1C): Hemoglobin A1C: 11.7 % — AB (ref 4.0–5.6)

## 2019-09-18 MED ORDER — ALBUTEROL SULFATE (2.5 MG/3ML) 0.083% IN NEBU: 2.5000 mg | INHALATION_SOLUTION | Freq: Four times a day (QID) | RESPIRATORY_TRACT | 6 refills | Status: DC | PRN

## 2019-09-18 MED ORDER — MONTELUKAST SODIUM 10 MG PO TABS: 10.0000 mg | ORAL_TABLET | Freq: Every day | ORAL | 5 refills | Status: DC

## 2019-09-18 MED ORDER — EDARBYCLOR 40-25 MG PO TABS: 1.0000 | ORAL_TABLET | Freq: Every day | ORAL | 1 refills | Status: DC

## 2019-09-18 MED ORDER — DEXILANT 30 MG PO CPDR: DELAYED_RELEASE_CAPSULE | ORAL | 0 refills | Status: DC

## 2019-09-18 MED ORDER — AMITRIPTYLINE HCL 25 MG PO TABS: ORAL_TABLET | ORAL | 5 refills | Status: DC

## 2019-09-18 MED ORDER — MELOXICAM 15 MG PO TABS: 15.0000 mg | ORAL_TABLET | Freq: Every day | ORAL | 2 refills | Status: AC | PRN

## 2019-09-18 MED ORDER — FLUTICASONE-SALMETEROL 100-50 MCG/DOSE IN AEPB: INHALATION_SPRAY | RESPIRATORY_TRACT | 0 refills | Status: DC

## 2019-09-18 MED ORDER — ALBUTEROL SULFATE HFA 108 (90 BASE) MCG/ACT IN AERS: INHALATION_SPRAY | RESPIRATORY_TRACT | 0 refills | Status: DC

## 2019-09-18 MED ORDER — CETIRIZINE HCL 10 MG PO TABS: 10.0000 mg | ORAL_TABLET | Freq: Every day | ORAL | 11 refills | Status: DC

## 2019-09-18 MED ORDER — LANTUS SOLOSTAR 100 UNIT/ML ~~LOC~~ SOPN: 40.0000 [IU] | PEN_INJECTOR | Freq: Two times a day (BID) | SUBCUTANEOUS | 11 refills | Status: DC

## 2019-09-18 NOTE — Progress Notes (Signed)
[place], [place]  [address] Phone:  [phone]   Fax:  [phone]   Established Patient Office Visit  Subjective:  Patient ID: Lori Shea, female    DOB: 01/11/1976  Age: 44 y.o. MRN: [id number]  CC:  Chief Complaint  Patient presents with  . Diabetes    HPI Lori Shea presents for follow up. She  has a past medical history of Allergy, Asthma (age 11 yo), Cholelithiasis (age 30 yo), COVID-19, Diabetes mellitus (2012), Elevated liver enzymes (09/13/2016), History of kidney stones, Hypertension (2015), Kidney stones, Migraines (2009), Morbid obesity ([place]), and PCOS (polycystic ovarian syndrome) (2010).   Diabetes Mellitus Patient presents for follow up of diabetes. Current symptoms include: hyperglycemia, hypoglycemia  and nausea. Symptoms have gradually worsened. Patient denies foot ulcerations, increased appetite, paresthesia of the feet, polydipsia and visual disturbances. Evaluation to date has included: fasting blood sugar, fasting lipid panel, hemoglobin A1C and microalbuminuria.  Home sugars: BGs have been labile ranging between 62 and 380. The hypoglycemia was during her COV-19 infection. Current treatment: Continued insulin which has been somewhat effective, Discontinued sulfonylurea which has been discontinued because of side effects and Continued metformin which has been unable to assess effectiveness.  She had to discontinue the glipizide due to yeast infections.  She admits that she is taking 12 to 14 units of NovoLog with meals..  Last dilated eye exam: 08/26/19.  She admits there was no diabetes related complications noted   Past Medical History:  Diagnosis Date  . Allergy    Foods:  mango, honey, corn.  Allergic to "everything tested"  15 years ago in [place]  . Asthma age 78 yo   Triggers:  cold weather, exercise, allergens, anxiety, URIs  . Cholelithiasis age 13 yo   Asymptomatic  . COVID-19   . Diabetes  mellitus 2012   Was diagnosed 2 years earlier with PCOS  . Elevated liver enzymes 09/13/2016  . History of kidney stones   . Hypertension 2015  . Kidney stones    family history of renal failure  . Migraines 2009  . Morbid obesity ([place])   . PCOS (polycystic ovarian syndrome) 2010    Past Surgical History:  Procedure Laterality Date  . COLPOSCOPY N/A   . LAPAROSCOPIC TUBAL LIGATION Bilateral 01/28/2019   Procedure: LAPAROSCOPIC TUBAL LIGATION AND PAP SMEAR;  Surgeon: Aletha Halim, MD;  Location: [facility];  Service: Gynecology;  Laterality: Bilateral;  . TONSILLECTOMY AND ADENOIDECTOMY Bilateral 12/23/2018   Procedure: TONSILLECTOMY AND ADENOIDECTOMY;  Surgeon: Leta Baptist, MD;  Location: [facility];  Service: ENT;  Laterality: Bilateral;    Family History  Problem Relation Age of Onset  . Diabetes Father   . Hyperlipidemia Father   . Hypertension Father   . Heart disease Father   . Kidney disease Father        Developed after 2nd CABG  . Diabetes Paternal Grandfather   . Heart disease Paternal Grandfather   . Hyperlipidemia Paternal Grandfather   . Hypertension Paternal Grandfather   . Kidney disease Paternal Grandfather     Social History   Socioeconomic History  . Marital status: Divorced    Spouse name: Not on file  . Number of children: 1  . Years of education: some comm. college  . Highest education level: Not on file  Occupational History  . Occupation: CNA    Comment: Multimedia programmer  Tobacco Use  . Smoking  status: Never Smoker  . Smokeless tobacco: Never Used  Substance and Sexual Activity  . Alcohol use: No  . Drug use: No  . Sexual activity: Yes    Birth control/protection: None    Comment: Sprintec  Other Topics Concern  . Not on file  Social History Narrative   Originally from [place], [place] to [place] in 1988.   In [place] in [place] at home with daughter on [address].   Social  Determinants of Health   Financial Resource Strain:   . Difficulty of Paying Living Expenses:   Food Insecurity:   . Worried About Charity fundraiser in the Last Year:   . Arboriculturist in the Last Year:   Transportation Needs:   . Film/video editor (Medical):   Marland Kitchen Lack of Transportation (Non-Medical):   Physical Activity:   . Days of Exercise per Week:   . Minutes of Exercise per Session:   Stress:   . Feeling of Stress :   Social Connections:   . Frequency of Communication with Friends and Family:   . Frequency of Social Gatherings with Friends and Family:   . Attends Religious Services:   . Active Member of Clubs or Organizations:   . Attends Archivist Meetings:   Marland Kitchen Marital Status:   Intimate Partner Violence:   . Fear of Current or Ex-Partner:   . Emotionally Abused:   Marland Kitchen Physically Abused:   . Sexually Abused:     Outpatient Medications Prior to Visit  Medication Sig Dispense Refill  . EASY COMFORT PEN NEEDLES 32G X 4 MM MISC use as directed test 2 times daily  3  . fluconazole (DIFLUCAN) 150 MG tablet Take 1 tablet (150 mg total) by mouth every 3 (three) days. 2 tablet 1  . ibuprofen (ADVIL) 200 MG tablet Take 400 mg by mouth every 6 (six) hours as needed for fever or headache (pain).    . insulin aspart (NOVOLOG FLEXPEN) 100 UNIT/ML FlexPen Inject 10-14 Units into the skin 3 (three) times daily before meals. 30 mL 5  . Insulin Pen Needle (PEN NEEDLES) 32G X 5 MM MISC 100 each by Does not apply route 2 (two) times daily. 100 each 3  . MAGNESIUM CITRATE PO Take 1 capsule by mouth every morning.     Marland Kitchen MAXALT-MLT 10 MG disintegrating tablet TAKE 1 TABLET (10MG  TOTAL) BY MOUTH AS NEEDED FOR MIGRAINE. MAY REPEAT IN 2 HOURS IF NEEDED. MAX 30MG /24 HOURS. 12 tablet 0  . metFORMIN (GLUCOPHAGE) 500 MG tablet Take 1 tablet (500 mg total) by mouth 2 (two) times daily with a meal. 60 tablet 5  . Psyllium (FIBER) 0.52 g CAPS Take 1 capsule by mouth at bedtime.     Marland Kitchen  ADVAIR DISKUS 100-50 MCG/DOSE AEPB INHALE 1 PUFF INTO THE LUNGS EVERY 12 HOURS. 2 each 0  . albuterol (PROVENTIL) (2.5 MG/3ML) 0.083% nebulizer solution Take 3 mLs (2.5 mg total) by nebulization every 6 (six) hours as needed for wheezing or shortness of breath. 75 mL 6  . amitriptyline (ELAVIL) 25 MG tablet TAKE 1 TABLET BY MOUTH NIGHTLY AT BEDTIME (Patient taking differently: Take 25 mg by mouth at bedtime. ) 30 tablet 5  . benzonatate (TESSALON PERLES) 100 MG capsule Take 1 capsule (100 mg total) by mouth 3 (three) times daily as needed. 20 capsule 0  . cetirizine (ZYRTEC) 10 MG tablet Take 1 tablet (10 mg total) by mouth  daily. (Patient taking differently: Take 10 mg by mouth every morning. ) 30 tablet 11  . DEXILANT 30 MG capsule TAKE 1 CAPSULE (30MG  TOTAL) BY MOUTH DAILY. TAKE ON AN EMPTY STOMACH. 90 capsule 0  . EDARBYCLOR 40-25 MG TABS TAKE 1 TABLET BY MOUTH DAILY. 60 tablet 0  . fluticasone (FLONASE) 50 MCG/ACT nasal spray Place 2 sprays into both nostrils daily. 16 g 6  . ibuprofen (ADVIL,MOTRIN) 600 MG tablet Take 1 tablet (600 mg total) by mouth every 6 (six) hours as needed. 30 tablet 0  . insulin glargine (LANTUS SOLOSTAR) 100 UNIT/ML Solostar Pen Inject 60 units at bedtime. 5 pen 11  . meloxicam (MOBIC) 15 MG tablet Take 1 tablet (15 mg total) by mouth daily. (Patient taking differently: Take 15 mg by mouth daily as needed for pain. ) 30 tablet 3  . montelukast (SINGULAIR) 10 MG tablet Take 1 tablet (10 mg total) by mouth daily. 30 tablet 5  . predniSONE (DELTASONE) 20 MG tablet 2 po at sametime daily for 5 days 10 tablet 0  . VENTOLIN HFA 108 (90 Base) MCG/ACT inhaler INHALE 2 PUFFS INTO THE LUNGS EVERY 4 HOURS AS NEEDED FOR WHEEZING ORSHORTNESS OF BREATH. 6.7 g 0  . doxycycline (VIBRA-TABS) 100 MG tablet Take 1 tablet (100 mg total) by mouth 2 (two) times daily. (Patient not taking: Reported on 09/18/2019) 20 tablet 0  . glipiZIDE (GLUCOTROL) 10 MG tablet Take 1 tablet (10 mg total) by  mouth 2 (two) times daily before a meal. (Patient not taking: Reported on 09/18/2019) 60 tablet 3   No facility-administered medications prior to visit.    Allergies  Allergen Reactions  . Corn-Containing Products Hives    "really bad asthma attacks"  . Honey Shortness Of Breath  . Mango Flavor Hives and Swelling    Pt allergic to all mango - tongue and throat swelling  . Glipizide   . Victoza [Liraglutide] Nausea And Vomiting    ROS Review of Systems  All other systems reviewed and are negative.     Objective:    Physical Exam  Constitutional: She is oriented to person, place, and time. She appears well-developed.  HENT:  Head: Normocephalic.  Eyes: Pupils are equal, round, and reactive to light.  Cardiovascular: Normal rate, regular rhythm, normal heart sounds and intact distal pulses.  Pulmonary/Chest: Effort normal and breath sounds normal.  Abdominal: Soft. Bowel sounds are normal.  Musculoskeletal:        General: Normal range of motion.     Cervical back: Normal range of motion.  Neurological: She is alert and oriented to person, place, and time.  Skin: Skin is warm and dry.  Psychiatric: She has a normal mood and affect. Her behavior is normal. Judgment and thought content normal.    BP 107/72   Pulse 90   Temp 97.6 F (36.4 C)   Ht 5\' 5"  (1.651 m)   Wt 265 lb (120.2 kg)   SpO2 94%   BMI 44.10 kg/m  Wt Readings from Last 3 Encounters:  09/18/19 265 lb (120.2 kg)  04/29/19 261 lb 6.4 oz (118.6 kg)  03/23/19 262 lb 12.8 oz (119.2 kg)     Health Maintenance Due  Topic Date Due  . OPHTHALMOLOGY EXAM  Never done  . COVID-19 Vaccine (1) Never done  . HIV Screening  Never done  . FOOT EXAM  03/15/2019    There are no preventive care reminders to display for this patient.  Lab Results  Component Value Date   TSH 5.090 (H) 02/10/2018   Lab Results  Component Value Date   WBC 11.5 (H) 02/01/2019   HGB 13.4 02/01/2019   HCT 38.5 02/01/2019   MCV  86.1 02/01/2019   PLT 366 02/01/2019   Lab Results  Component Value Date   NA 135 02/01/2019   K 3.6 02/01/2019   CO2 23 02/01/2019   GLUCOSE 394 (H) 02/01/2019   BUN 11 02/01/2019   CREATININE 0.75 02/01/2019   BILITOT 0.3 02/01/2019   ALKPHOS 97 02/01/2019   AST 27 02/01/2019   ALT 29 02/01/2019   PROT 6.4 (L) 02/01/2019   ALBUMIN 3.3 (L) 02/01/2019   CALCIUM 8.9 02/01/2019   ANIONGAP 10 02/01/2019   Lab Results  Component Value Date   CHOL 180 07/19/2016   Lab Results  Component Value Date   HDL 60 07/19/2016   Lab Results  Component Value Date   LDLCALC 98 07/19/2016   Lab Results  Component Value Date   TRIG 109 07/19/2016   Lab Results  Component Value Date   CHOLHDL 3.6 Ratio 02/09/2009   Lab Results  Component Value Date   HGBA1C 11.7 (A) 09/18/2019      Assessment & Plan:   Problem List Items Addressed This Visit      Cardiovascular and Mediastinum   Hypertension   Relevant Medications   Azilsartan-Chlorthalidone (EDARBYCLOR) 40-25 MG TABS     Respiratory   Asthma   Relevant Medications   albuterol (VENTOLIN HFA) 108 (90 Base) MCG/ACT inhaler   albuterol (PROVENTIL) (2.5 MG/3ML) 0.083% nebulizer solution   Fluticasone-Salmeterol (ADVAIR DISKUS) 100-50 MCG/DOSE AEPB   montelukast (SINGULAIR) 10 MG tablet    Other Visit Diagnoses    Type 2 diabetes mellitus without complication, with long-term current use of insulin (HCC)    -  Primary   Relevant Medications   insulin glargine (LANTUS SOLOSTAR) 100 UNIT/ML Solostar Pen   Azilsartan-Chlorthalidone (EDARBYCLOR) 40-25 MG TABS   Other Relevant Orders   POCT glycosylated hemoglobin (Hb A1C) (Completed)   POCT URINALYSIS DIP (CLINITEK) (Completed)   Urine Culture   Comp. Metabolic Panel (12)   Lipid panel   Gastroesophageal reflux disease without esophagitis       Relevant Medications   Dexlansoprazole (DEXILANT) 30 MG capsule   Acute right-sided low back pain, unspecified whether sciatica  present       Relevant Medications   meloxicam (MOBIC) 15 MG tablet   Moderate intermittent asthma without complication       Relevant Medications   albuterol (VENTOLIN HFA) 108 (90 Base) MCG/ACT inhaler   albuterol (PROVENTIL) (2.5 MG/3ML) 0.083% nebulizer solution   Fluticasone-Salmeterol (ADVAIR DISKUS) 100-50 MCG/DOSE AEPB   montelukast (SINGULAIR) 10 MG tablet   Insomnia, unspecified type       Relevant Medications   amitriptyline (ELAVIL) 25 MG tablet   Chronic sinusitis, unspecified location       Relevant Medications   cetirizine (ZYRTEC) 10 MG tablet      Meds ordered this encounter  Medications  . insulin glargine (LANTUS SOLOSTAR) 100 UNIT/ML Solostar Pen    Sig: Inject 40 Units into the skin 2 (two) times daily.    Dispense:  10 pen    Refill:  11    Order Specific Question:   Supervising Provider    Answer:   Tresa Garter [id number]  . Azilsartan-Chlorthalidone (EDARBYCLOR) 40-25 MG TABS    Sig: Take 1 tablet by mouth daily.    Dispense:  90 tablet    Refill:  1    Order Specific Question:   Supervising Provider    Answer:   Tresa Garter [id number]  . Dexlansoprazole (DEXILANT) 30 MG capsule    Sig: TAKE 1 CAPSULE (30MG  TOTAL) BY MOUTH DAILY. TAKE ON AN EMPTY STOMACH.    Dispense:  90 capsule    Refill:  0    Order Specific Question:   Supervising Provider    Answer:   Tresa Garter [id number]  . meloxicam (MOBIC) 15 MG tablet    Sig: Take 1 tablet (15 mg total) by mouth daily as needed for pain.    Dispense:  30 tablet    Refill:  2    Order Specific Question:   Supervising Provider    Answer:   Tresa Garter [id number]  . albuterol (VENTOLIN HFA) 108 (90 Base) MCG/ACT inhaler    Sig: INHALE 2 PUFFS INTO THE LUNGS EVERY 4 HOURS AS NEEDED FOR WHEEZING ORSHORTNESS OF BREATH.    Dispense:  6.7 g    Refill:  0    Order Specific Question:   Supervising Provider    Answer:   Tresa Garter [id number]  . albuterol (PROVENTIL)  (2.5 MG/3ML) 0.083% nebulizer solution    Sig: Take 3 mLs (2.5 mg total) by nebulization every 6 (six) hours as needed for wheezing or shortness of breath.    Dispense:  75 mL    Refill:  6    Order Specific Question:   Supervising Provider    Answer:   Tresa Garter [id number]  . Fluticasone-Salmeterol (ADVAIR DISKUS) 100-50 MCG/DOSE AEPB    Sig: INHALE 1 PUFF INTO THE LUNGS EVERY 12 HOURS.    Dispense:  2 each    Refill:  0    Order Specific Question:   Supervising Provider    Answer:   Tresa Garter [id number]  . amitriptyline (ELAVIL) 25 MG tablet    Sig: TAKE 1 TABLET BY MOUTH NIGHTLY AT BEDTIME    Dispense:  30 tablet    Refill:  5    This prescription was filled on 02/10/2019. Any refills authorized will be placed on file.    Order Specific Question:   Supervising Provider    Answer:   Tresa Garter [id number]  . cetirizine (ZYRTEC) 10 MG tablet    Sig: Take 1 tablet (10 mg total) by mouth daily.    Dispense:  30 tablet    Refill:  11    Order Specific Question:   Supervising Provider    Answer:   Tresa Garter [id number]  . montelukast (SINGULAIR) 10 MG tablet    Sig: Take 1 tablet (10 mg total) by mouth daily.    Dispense:  30 tablet    Refill:  5    Order Specific Question:   Supervising Provider    Answer:   Tresa Garter [id number]    Follow-up: Return in about 3 months (around 12/19/2019).    Vevelyn Francois, NP

## 2019-09-19 LAB — COMP. METABOLIC PANEL (12)
AST: 23 IU/L (ref 0–40)
Albumin/Globulin Ratio: 1.6 (ref 1.2–2.2)
Albumin: 4 g/dL (ref 3.8–4.8)
Alkaline Phosphatase: 109 IU/L (ref 48–121)
BUN/Creatinine Ratio: 21 (ref 9–23)
BUN: 14 mg/dL (ref 6–24)
Bilirubin Total: 0.4 mg/dL (ref 0.0–1.2)
Calcium: 9.2 mg/dL (ref 8.7–10.2)
Chloride: 99 mmol/L (ref 96–106)
Creatinine, Ser: 0.68 mg/dL (ref 0.57–1.00)
GFR calc Af Amer: 122 mL/min/{1.73_m2} (ref >59)
GFR calc non Af Amer: 106 mL/min/{1.73_m2} (ref >59)
Globulin, Total: 2.5 g/dL (ref 1.5–4.5)
Glucose: 307 mg/dL — ABNORMAL HIGH (ref 65–99)
Potassium: 3.8 mmol/L (ref 3.5–5.2)
Sodium: 137 mmol/L (ref 134–144)
Total Protein: 6.5 g/dL (ref 6.0–8.5)

## 2019-09-19 LAB — LIPID PANEL
Chol/HDL Ratio: 4.6 ratio — ABNORMAL HIGH (ref 0.0–4.4)
Cholesterol, Total: 182 mg/dL (ref 100–199)
HDL: 40 mg/dL (ref >39)
LDL Chol Calc (NIH): 112 mg/dL — ABNORMAL HIGH (ref 0–99)
Triglycerides: 169 mg/dL — ABNORMAL HIGH (ref 0–149)
VLDL Cholesterol Cal: 30 mg/dL (ref 5–40)

## 2019-09-22 ENCOUNTER — Telehealth: Payer: Self-pay | Admitting: Family Medicine

## 2019-09-22 LAB — URINE CULTURE

## 2019-09-22 NOTE — Telephone Encounter (Signed)
Pt called and wanted to know the results of urine culture that was done on Friday, it shows that the results are back.

## 2019-09-22 NOTE — Telephone Encounter (Signed)
Pt having painful urination and states that the urine smells bad.

## 2019-09-23 ENCOUNTER — Other Ambulatory Visit: Payer: Self-pay

## 2019-09-23 ENCOUNTER — Other Ambulatory Visit: Payer: Self-pay | Admitting: Nurse Practitioner

## 2019-09-23 MED ORDER — NITROFURANTOIN MACROCRYSTAL 100 MG PO CAPS: 100.0000 mg | ORAL_CAPSULE | Freq: Four times a day (QID) | ORAL | 0 refills | Status: DC

## 2019-09-23 MED ORDER — NITROFURANTOIN MACROCRYSTAL 100 MG PO CAPS: 100.0000 mg | ORAL_CAPSULE | Freq: Four times a day (QID) | ORAL | 0 refills | Status: AC

## 2019-09-23 NOTE — Progress Notes (Signed)
Called and spoke w/ patient  she has already reviewed results on her my chart acct.,pt want have medication sent to other pharmacy. Switch to [facility].

## 2019-09-23 NOTE — Telephone Encounter (Signed)
Tried to call patient her voicemail was full, was not able to leave a message.

## 2019-09-23 NOTE — Telephone Encounter (Signed)
Ecoli I will send in macrobid BID

## 2019-09-23 NOTE — Telephone Encounter (Signed)
Called and spoke with patient about results.

## 2019-09-24 ENCOUNTER — Other Ambulatory Visit: Payer: Self-pay | Admitting: Family Medicine

## 2019-09-28 ENCOUNTER — Encounter: Payer: Self-pay | Admitting: Family Medicine

## 2019-10-01 ENCOUNTER — Encounter: Payer: Self-pay | Admitting: Nurse Practitioner

## 2019-10-01 ENCOUNTER — Other Ambulatory Visit: Payer: Self-pay

## 2019-10-01 ENCOUNTER — Ambulatory Visit (INDEPENDENT_AMBULATORY_CARE_PROVIDER_SITE_OTHER): Payer: Medicaid Other | Admitting: Nurse Practitioner

## 2019-10-01 VITALS — BP 122/84 | HR 90 | Temp 97.0°F | Ht 65.0 in | Wt 270.0 lb

## 2019-10-01 DIAGNOSIS — R8290 Unspecified abnormal findings in urine: Secondary | ICD-10-CM

## 2019-10-01 DIAGNOSIS — I10 Essential (primary) hypertension: Secondary | ICD-10-CM

## 2019-10-01 DIAGNOSIS — B379 Candidiasis, unspecified: Secondary | ICD-10-CM

## 2019-10-01 DIAGNOSIS — Z794 Long term (current) use of insulin: Secondary | ICD-10-CM

## 2019-10-01 DIAGNOSIS — E1165 Type 2 diabetes mellitus with hyperglycemia: Secondary | ICD-10-CM

## 2019-10-01 LAB — POCT URINALYSIS DIPSTICK
Bilirubin, UA: NEGATIVE
Blood, UA: NEGATIVE
Glucose, UA: POSITIVE — AB
Leukocytes, UA: NEGATIVE
Nitrite, UA: POSITIVE
Protein, UA: NEGATIVE
Spec Grav, UA: 1.025 (ref 1.010–1.025)
Urobilinogen, UA: 0.2 E.U./dL
pH, UA: 6 (ref 5.0–8.0)

## 2019-10-01 MED ORDER — LANTUS SOLOSTAR 100 UNIT/ML ~~LOC~~ SOPN: 50.0000 [IU] | PEN_INJECTOR | Freq: Two times a day (BID) | SUBCUTANEOUS | 11 refills | Status: DC

## 2019-10-01 MED ORDER — FLUCONAZOLE 150 MG PO TABS: 150.0000 mg | ORAL_TABLET | Freq: Once | ORAL | 0 refills | Status: AC

## 2019-10-01 MED ORDER — SULFAMETHOXAZOLE-TRIMETHOPRIM 800-160 MG PO TABS: 1.0000 | ORAL_TABLET | Freq: Two times a day (BID) | ORAL | 0 refills | Status: AC

## 2019-10-01 NOTE — Progress Notes (Signed)
[address] [place], [place]  [address] Phone:  [phone]   Fax:  [phone]    Acute Office Visit  Subjective:    Patient ID: Lori Shea, female    DOB: 12-16-1975, 44 y.o.   MRN: [id number]  Chief Complaint  Patient presents with   Urinary Tract Infection    Sx; urgency, pain when urinating; pain lower abdomen and back; finished antibiotices given last OV had not gotten better.     HPI Patient is in today for UTI symptoms. She  has a past medical history of Allergy, Asthma (age 15 yo), Cholelithiasis (age 6 yo), COVID-19, Diabetes mellitus (2012), Elevated liver enzymes (09/13/2016), History of kidney stones, Hypertension (2015), Kidney stones, Migraines (2009), Morbid obesity ([place]), and PCOS (polycystic ovarian syndrome) (2010).   Urinary Tract Infection Patient complains of dysuria, frequency and incontinence. She has had symptoms for several days. Patient also complains of back pain, stomach ache and vagnial irritation. Patient denies back pain, congestion, cough, rhinitis and sorethroat. Patient does have a history of recurrent UTI. Patient does not have a history of pyelonephritis. She has uncontrolled diabetes. She admits that she her last CBG was 300. She is not moinitor her CBG regularly but admits to using the sliding scale with 3 meals.    Past Medical History:  Diagnosis Date   Allergy    Foods:  mango, honey, corn.  Allergic to "everything tested"  15 years ago in [place] age 13 yo   Triggers:  cold weather, exercise, allergens, anxiety, URIs   Cholelithiasis age 54 yo   Asymptomatic   COVID-19    Diabetes mellitus 2012   Was diagnosed 2 years earlier with PCOS   Elevated liver enzymes 09/13/2016   History of kidney stones    Hypertension 2015   Kidney stones    family history of renal failure   Migraines 2009   Morbid obesity ([place])    PCOS (polycystic ovarian syndrome) 2010    Past Surgical  History:  Procedure Laterality Date   COLPOSCOPY N/A    LAPAROSCOPIC TUBAL LIGATION Bilateral 01/28/2019   Procedure: LAPAROSCOPIC TUBAL LIGATION AND PAP SMEAR;  Surgeon: Aletha Halim, MD;  Location: [facility];  Service: Gynecology;  Laterality: Bilateral;   TONSILLECTOMY AND ADENOIDECTOMY Bilateral 12/23/2018   Procedure: TONSILLECTOMY AND ADENOIDECTOMY;  Surgeon: Leta Baptist, MD;  Location: [facility];  Service: ENT;  Laterality: Bilateral;    Family History  Problem Relation Age of Onset   Diabetes Father    Hyperlipidemia Father    Hypertension Father    Heart disease Father    Kidney disease Father        Developed after 2nd CABG   Diabetes Paternal Grandfather    Heart disease Paternal Grandfather    Hyperlipidemia Paternal Grandfather    Hypertension Paternal Grandfather    Kidney disease Paternal Grandfather     Social History   Socioeconomic History   Marital status: Divorced    Spouse name: Not on file   Number of children: 1   Years of education: some comm. college   Highest education level: Not on file  Occupational History   Occupation: CNA    Comment: [employer]  Tobacco Use   Smoking status: Never Smoker   Smokeless tobacco: Never Used  Scientific laboratory technician Use: Never used  Substance and Sexual Activity   Alcohol use: No   Drug  use: No   Sexual activity: Yes    Birth control/protection: None    Comment: [employer]  Other Topics Concern   Not on file  Social History Narrative   Originally from [place], [place] to [place] in 1988.   In [place] in [place] at home with daughter on [place].   Social Determinants of Health   Financial Resource Strain:    Difficulty of Paying Living Expenses:   Food Insecurity:    Worried About Charity fundraiser in the Last Year:    Arboriculturist in the Last Year:   Transportation Needs:    Film/video editor  (Medical):    Lack of Transportation (Non-Medical):   Physical Activity:    Days of Exercise per Week:    Minutes of Exercise per Session:   Stress:    Feeling of Stress :   Social Connections:    Frequency of Communication with Friends and Family:    Frequency of Social Gatherings with Friends and Family:    Attends Religious Services:    Active Member of Clubs or Organizations:    Attends Music therapist:    Marital Status:   Intimate Partner Violence:    Fear of Current or Ex-Partner:    Emotionally Abused:    Physically Abused:    Sexually Abused:     Outpatient Medications Prior to Visit  Medication Sig Dispense Refill   albuterol (PROVENTIL) (2.5 MG/3ML) 0.083% nebulizer solution Take 3 mLs (2.5 mg total) by nebulization every 6 (six) hours as needed for wheezing or shortness of breath. 75 mL 6   albuterol (VENTOLIN HFA) 108 (90 Base) MCG/ACT inhaler INHALE 2 PUFFS INTO THE LUNGS EVERY 4 HOURS AS NEEDED FOR WHEEZING ORSHORTNESS OF BREATH. 6.7 g 0   amitriptyline (ELAVIL) 25 MG tablet TAKE 1 TABLET BY MOUTH NIGHTLY AT BEDTIME 30 tablet 5   Azilsartan-Chlorthalidone (EDARBYCLOR) 40-25 MG TABS Take 1 tablet by mouth daily. 90 tablet 1   cetirizine (ZYRTEC) 10 MG tablet Take 1 tablet (10 mg total) by mouth daily. 30 tablet 11   Dexlansoprazole (DEXILANT) 30 MG capsule TAKE 1 CAPSULE (30MG  TOTAL) BY MOUTH DAILY. TAKE ON AN EMPTY STOMACH. 90 capsule 0   EASY COMFORT PEN NEEDLES 32G X 4 MM MISC use as directed test 2 times daily  3   Fluticasone-Salmeterol (ADVAIR DISKUS) 100-50 MCG/DOSE AEPB INHALE 1 PUFF INTO THE LUNGS EVERY 12 HOURS. 2 each 0   ibuprofen (ADVIL) 200 MG tablet Take 400 mg by mouth every 6 (six) hours as needed for fever or headache (pain).     insulin aspart (NOVOLOG FLEXPEN) 100 UNIT/ML FlexPen Inject 10-14 Units into the skin 3 (three) times daily before meals. 30 mL 5   Insulin Pen Needle (PEN NEEDLES) 32G X 5 MM MISC 100  each by Does not apply route 2 (two) times daily. 100 each 3   MAGNESIUM CITRATE PO Take 1 capsule by mouth every morning.      MAXALT-MLT 10 MG disintegrating tablet TAKE 1 TABLET (10MG  TOTAL) BY MOUTH AS NEEDED FOR MIGRAINE. MAY REPEAT IN 2 HOURS IF NEEDED. MAX 30MG /24 HOURS. 12 tablet 0   meloxicam (MOBIC) 15 MG tablet Take 1 tablet (15 mg total) by mouth daily as needed for pain. 30 tablet 2   metFORMIN (GLUCOPHAGE) 500 MG tablet Take 1 tablet (500 mg total) by mouth 2 (two) times daily with a meal. 60 tablet 5  montelukast (SINGULAIR) 10 MG tablet Take 1 tablet (10 mg total) by mouth daily. 30 tablet 5   Psyllium (FIBER) 0.52 g CAPS Take 1 capsule by mouth at bedtime.      insulin glargine (LANTUS SOLOSTAR) 100 UNIT/ML Solostar Pen Inject 40 Units into the skin 2 (two) times daily. 10 pen 11   fluconazole (DIFLUCAN) 150 MG tablet Take 1 tablet (150 mg total) by mouth every 3 (three) days. 2 tablet 1   No facility-administered medications prior to visit.    Allergies  Allergen Reactions   Corn-Containing Products Hives    "really bad asthma attacks"   Honey Shortness Of Breath   Mango Flavor Hives and Swelling    Pt allergic to all mango - tongue and throat swelling   Glipizide    Victoza [Liraglutide] Nausea And Vomiting    Review of Systems     Objective:    Physical Exam Constitutional:      General: She is not in acute distress.    Appearance: She is obese. She is not ill-appearing.  HENT:     Head: Normocephalic and atraumatic.  Cardiovascular:     Rate and Rhythm: Normal rate.  Pulmonary:     Effort: Pulmonary effort is normal.  Musculoskeletal:        General: Normal range of motion.  Skin:    General: Skin is warm and dry.  Neurological:     Mental Status: She is alert and oriented to person, place, and time.  Psychiatric:        Mood and Affect: Mood normal.        Behavior: Behavior normal.        Thought Content: Thought content normal.          Judgment: Judgment normal.     BP 122/84 (BP Location: Right Arm, Patient Position: Sitting, Cuff Size: Large)    Pulse 90    Temp (!) 97 F (36.1 C)    Ht 5\' 5"  (1.651 m)    Wt 270 lb 0.2 oz (122.5 kg)    SpO2 99%    BMI 44.93 kg/m  Wt Readings from Last 3 Encounters:  10/01/19 270 lb 0.2 oz (122.5 kg)  09/18/19 265 lb (120.2 kg)  04/29/19 261 lb 6.4 oz (118.6 kg)    Health Maintenance Due  Topic Date Due   OPHTHALMOLOGY EXAM  Never done   COVID-19 Vaccine (1) Never done   HIV Screening  Never done   FOOT EXAM  03/15/2019    There are no preventive care reminders to display for this patient.   Lab Results  Component Value Date   TSH 5.090 (H) 02/10/2018   Lab Results  Component Value Date   WBC 11.5 (H) 02/01/2019   HGB 13.4 02/01/2019   HCT 38.5 02/01/2019   MCV 86.1 02/01/2019   PLT 366 02/01/2019   Lab Results  Component Value Date   NA 137 09/18/2019   K 3.8 09/18/2019   CO2 23 02/01/2019   GLUCOSE 307 (H) 09/18/2019   BUN 14 09/18/2019   CREATININE 0.68 09/18/2019   BILITOT 0.4 09/18/2019   ALKPHOS 109 09/18/2019   AST 23 09/18/2019   ALT 29 02/01/2019   PROT 6.5 09/18/2019   ALBUMIN 4.0 09/18/2019   CALCIUM 9.2 09/18/2019   ANIONGAP 10 02/01/2019   Lab Results  Component Value Date   CHOL 182 09/18/2019   Lab Results  Component Value Date   HDL 40 09/18/2019  Lab Results  Component Value Date   LDLCALC 112 (H) 09/18/2019   Lab Results  Component Value Date   TRIG 169 (H) 09/18/2019   Lab Results  Component Value Date   CHOLHDL 4.6 (H) 09/18/2019   Lab Results  Component Value Date   HGBA1C 11.7 (A) 09/18/2019       Assessment & Plan:   Problem List Items Addressed This Visit      Cardiovascular and Mediastinum   Hypertension - Primary   Relevant Orders   Urinalysis Dipstick (Completed)     Endocrine   Type 2 diabetes mellitus with hyperglycemia, with long-term current use of insulin (HCC)   Relevant Medications    insulin glargine (LANTUS SOLOSTAR) 100 UNIT/ML Solostar Pen     Other   Yeast infection   Relevant Medications   sulfamethoxazole-trimethoprim (BACTRIM DS) 800-160 MG tablet    Other Visit Diagnoses    Abnormal urinalysis       Relevant Medications   sulfamethoxazole-trimethoprim (BACTRIM DS) 800-160 MG tablet       Meds ordered this encounter  Medications   sulfamethoxazole-trimethoprim (BACTRIM DS) 800-160 MG tablet    Sig: Take 1 tablet by mouth 2 (two) times daily for 7 days.    Dispense:  14 tablet    Refill:  0    Order Specific Question:   Supervising Provider    Answer:   Tresa Garter [[id number]]   insulin glargine (LANTUS SOLOSTAR) 100 UNIT/ML Solostar Pen    Sig: Inject 50 Units into the skin 2 (two) times daily.    Dispense:  10 pen    Refill:  11    Order Specific Question:   Supervising Provider    Answer:   Tresa Garter [[id number]]   fluconazole (DIFLUCAN) 150 MG tablet    Sig: Take 1 tablet (150 mg total) by mouth once for 1 dose.    Dispense:  2 tablet    Refill:  0    Order Specific Question:   Supervising Provider    Answer:   Tresa Garter [[id number]]     Vevelyn Francois, NP

## 2019-10-09 ENCOUNTER — Other Ambulatory Visit: Payer: Self-pay | Admitting: Nurse Practitioner

## 2019-10-09 DIAGNOSIS — R109 Unspecified abdominal pain: Secondary | ICD-10-CM

## 2019-10-09 DIAGNOSIS — N1330 Unspecified hydronephrosis: Secondary | ICD-10-CM

## 2019-10-09 NOTE — Telephone Encounter (Signed)
I will be glad to place an order for evaluation with an [place].

## 2019-10-09 NOTE — Progress Notes (Signed)
   [address] [place], [place]  [address] Phone:  [phone]   Fax:  [phone]  General/Other - Patient sent a MyChart message for left flank pain.  She has completed her second round of antibiotic therapy for current UTI.  She currently denies any fever, chills, nausea, vomiting, dysuria or hematuria.  She does however continue to have flank pain.  She admits that several years ago she was diagnosed with renal stones.  She would like to have this reevaluated.   Assessment  Primary Diagnosis & Pertinent Problem List: The primary encounter diagnosis was Left flank pain, chronic. A diagnosis of Hydronephrosis of left kidney was also pertinent to this visit.  Visit Diagnosis: 1. Left flank pain, chronic   2. Hydronephrosis of left kidney     Plan of Care  Pharmacotherapy (Medications Ordered): No orders of the defined types were placed in this encounter.  New Prescriptions   No medications on file   Medications administered today: Coby Antrobus. Eugenio Hoes "Juliann Pulse" had no medications administered during this visit. Lab-work, procedure(s), and/or referral(s): No orders of the defined types were placed in this encounter.  Imaging and/or referral(s): None

## 2019-10-14 ENCOUNTER — Telehealth: Payer: Self-pay | Admitting: Nurse Practitioner

## 2019-10-14 NOTE — Telephone Encounter (Signed)
error 

## 2019-10-14 NOTE — Telephone Encounter (Signed)
Appointment has been scheduled for 6/29 11am at [facility]. Patient message was sent.

## 2019-10-20 ENCOUNTER — Ambulatory Visit
Admission: RE | Admit: 2019-10-20 | Discharge: 2019-10-20 | Disposition: A | Discharge status: Home / Self Care | Payer: Medicaid Other | Source: Ambulatory Visit | Attending: Nurse Practitioner | Admitting: Nurse Practitioner

## 2019-10-20 DIAGNOSIS — G8929 Other chronic pain: Secondary | ICD-10-CM

## 2019-10-20 DIAGNOSIS — N1330 Unspecified hydronephrosis: Secondary | ICD-10-CM

## 2019-10-27 ENCOUNTER — Other Ambulatory Visit: Payer: Self-pay | Admitting: Family Medicine

## 2019-10-27 DIAGNOSIS — I10 Essential (primary) hypertension: Secondary | ICD-10-CM

## 2019-10-27 DIAGNOSIS — J4530 Mild persistent asthma, uncomplicated: Secondary | ICD-10-CM

## 2019-10-29 NOTE — Progress Notes (Signed)
We will just hold off for now. Unless her symptoms return.  Thanks

## 2019-10-29 NOTE — Progress Notes (Signed)
Patient notified.     Thank you!

## 2019-11-02 ENCOUNTER — Encounter (HOSPITAL_BASED_OUTPATIENT_CLINIC_OR_DEPARTMENT_OTHER): Payer: Self-pay | Admitting: *Deleted

## 2019-11-02 ENCOUNTER — Emergency Department (HOSPITAL_BASED_OUTPATIENT_CLINIC_OR_DEPARTMENT_OTHER): Payer: Medicaid Other

## 2019-11-02 ENCOUNTER — Other Ambulatory Visit: Payer: Self-pay

## 2019-11-02 ENCOUNTER — Emergency Department (HOSPITAL_BASED_OUTPATIENT_CLINIC_OR_DEPARTMENT_OTHER)
Admission: EM | Admit: 2019-11-02 | Discharge: 2019-11-02 | Disposition: A | Discharge status: Home / Self Care | Payer: Medicaid Other | Attending: Emergency Medicine | Admitting: Emergency Medicine

## 2019-11-02 DIAGNOSIS — R0789 Other chest pain: Secondary | ICD-10-CM | POA: Diagnosis not present

## 2019-11-02 DIAGNOSIS — E1165 Type 2 diabetes mellitus with hyperglycemia: Secondary | ICD-10-CM | POA: Diagnosis not present

## 2019-11-02 DIAGNOSIS — Z7951 Long term (current) use of inhaled steroids: Secondary | ICD-10-CM | POA: Insufficient documentation

## 2019-11-02 DIAGNOSIS — I10 Essential (primary) hypertension: Secondary | ICD-10-CM | POA: Insufficient documentation

## 2019-11-02 DIAGNOSIS — Z79899 Other long term (current) drug therapy: Secondary | ICD-10-CM | POA: Diagnosis not present

## 2019-11-02 DIAGNOSIS — R739 Hyperglycemia, unspecified: Secondary | ICD-10-CM

## 2019-11-02 DIAGNOSIS — Z794 Long term (current) use of insulin: Secondary | ICD-10-CM | POA: Insufficient documentation

## 2019-11-02 DIAGNOSIS — J45909 Unspecified asthma, uncomplicated: Secondary | ICD-10-CM | POA: Insufficient documentation

## 2019-11-02 LAB — BASIC METABOLIC PANEL
Anion gap: 12 (ref 5–15)
BUN: 13 mg/dL (ref 6–20)
CO2: 23 mmol/L (ref 22–32)
Calcium: 8.6 mg/dL — ABNORMAL LOW (ref 8.9–10.3)
Chloride: 100 mmol/L (ref 98–111)
Creatinine, Ser: 0.72 mg/dL (ref 0.44–1.00)
GFR calc Af Amer: >60 mL/min (ref >60)
GFR calc non Af Amer: >60 mL/min (ref >60)
Glucose, Bld: 284 mg/dL — ABNORMAL HIGH (ref 70–99)
Potassium: 3.5 mmol/L (ref 3.5–5.1)
Sodium: 135 mmol/L (ref 135–145)

## 2019-11-02 LAB — CBC
HCT: 40.5 % (ref 36.0–46.0)
Hemoglobin: 13.3 g/dL (ref 12.0–15.0)
MCH: 28.7 pg (ref 26.0–34.0)
MCHC: 32.8 g/dL (ref 30.0–36.0)
MCV: 87.3 fL (ref 80.0–100.0)
Platelets: 301 10*3/uL (ref 150–400)
RBC: 4.64 MIL/uL (ref 3.87–5.11)
RDW: 13.9 % (ref 11.5–15.5)
WBC: 10.4 10*3/uL (ref 4.0–10.5)
nRBC: 0 % (ref 0.0–0.2)

## 2019-11-02 LAB — TROPONIN I (HIGH SENSITIVITY)
Troponin I (High Sensitivity): 2 ng/L (ref <18)
Troponin I (High Sensitivity): 3 ng/L (ref <18)

## 2019-11-02 LAB — CBG MONITORING, ED: Glucose-Capillary: 247 mg/dL — ABNORMAL HIGH (ref 70–99)

## 2019-11-02 MED ORDER — IBUPROFEN 800 MG PO TABS
800.0000 mg | ORAL_TABLET | Freq: Once | ORAL | Status: AC
  Administered 2019-11-02: 800 mg via ORAL
  Filled 2019-11-02: qty 1

## 2019-11-02 MED ORDER — CYCLOBENZAPRINE HCL 5 MG PO TABS
5.0000 mg | ORAL_TABLET | Freq: Three times a day (TID) | ORAL | 0 refills | Status: DC | PRN
Start: 2019-11-02 — End: 2020-12-28

## 2019-11-02 MED ORDER — IBUPROFEN 800 MG PO TABS
800.0000 mg | ORAL_TABLET | Freq: Three times a day (TID) | ORAL | 0 refills | Status: DC
Start: 2019-11-02 — End: 2021-09-14

## 2019-11-02 MED ORDER — SODIUM CHLORIDE 0.9% FLUSH
3.0000 mL | Freq: Once | INTRAVENOUS | Status: DC
  Filled 2019-11-02: qty 3

## 2019-11-02 MED ORDER — CYCLOBENZAPRINE HCL 5 MG PO TABS
5.0000 mg | ORAL_TABLET | Freq: Once | ORAL | Status: AC
  Administered 2019-11-02: 5 mg via ORAL
  Filled 2019-11-02: qty 1

## 2019-11-02 NOTE — ED Triage Notes (Signed)
Chest pain sharp in nature. Pain is in the center of her chest and upper back. She feels SOB.

## 2019-11-02 NOTE — ED Provider Notes (Signed)
[facility] EMERGENCY DEPARTMENT Provider Note   CSN: [id number] Arrival date & time: 11/02/19  1453     History No chief complaint on file.   Lori Shea is a 44 y.o. female history of diabetes, hypertension, here presenting with chest pain. Patient states that she woke up this morning and had some right-sided chest pain.  She went to work and the pain got worse.  It is crampy in nature and worse with movement.  She denies any history of CAD or stents.   The history is provided by the patient.       Past Medical History:  Diagnosis Date  . Allergy    Foods:  mango, honey, corn.  Allergic to "everything tested"  15 years ago in [place]  . Asthma age 48 yo   Triggers:  cold weather, exercise, allergens, anxiety, URIs  . Cholelithiasis age 56 yo   Asymptomatic  . COVID-19   . Diabetes mellitus 2012   Was diagnosed 2 years earlier with PCOS  . Elevated liver enzymes 09/13/2016  . History of kidney stones   . Hypertension 2015  . Kidney stones    family history of renal failure  . Migraines 2009  . Morbid obesity ([place])   . PCOS (polycystic ovarian syndrome) 2010    Patient Active Problem List   Diagnosis Date Noted  . Hemoglobin A1C greater than 9%, indicating poor diabetic control 03/23/2019  . Hyperglycemia 03/23/2019  . Class 3 severe obesity due to excess calories with serious comorbidity and body mass index (BMI) of 40.0 to 44.9 in adult ([place]) 03/23/2019  . Yeast infection 03/23/2019  . BMI 40.0-44.9, adult ([place]) 03/09/2019  . Cervical dysplasia 07/31/2017  . Alopecia of scalp 05/09/2017  . Pain in left toe(s) 12/27/2016  . Closed avulsion fracture of lateral malleolus of right fibula 12/27/2016  . Morbid obesity ([place]) 09/13/2016  . Elevated liver enzymes 09/13/2016  . Allergy   . Asthma   . Hypertension 04/23/2013  . Type 2 diabetes mellitus with hyperglycemia, with long-term current use of insulin ([place]) 04/23/2010  . PCOS (polycystic ovarian  syndrome) 04/23/2008  . Migraines 04/24/2007    Past Surgical History:  Procedure Laterality Date  . COLPOSCOPY N/A   . LAPAROSCOPIC TUBAL LIGATION Bilateral 01/28/2019   Procedure: LAPAROSCOPIC TUBAL LIGATION AND PAP SMEAR;  Surgeon: Aletha Halim, MD;  Location: [facility];  Service: Gynecology;  Laterality: Bilateral;  . TONSILLECTOMY AND ADENOIDECTOMY Bilateral 12/23/2018   Procedure: TONSILLECTOMY AND ADENOIDECTOMY;  Surgeon: Leta Baptist, MD;  Location: [facility];  Service: ENT;  Laterality: Bilateral;     OB History    Gravida  4   Para  1   Term  1   Preterm      AB  3   Living  1     SAB  3   TAB      Ectopic      Multiple      Live Births              Family History  Problem Relation Age of Onset  . Diabetes Father   . Hyperlipidemia Father   . Hypertension Father   . Heart disease Father   . Kidney disease Father        Developed after 2nd CABG  . Diabetes Paternal Grandfather   . Heart disease Paternal Grandfather   . Hyperlipidemia Paternal Grandfather   . Hypertension Paternal Grandfather   .  Kidney disease Paternal Grandfather     Social History   Tobacco Use  . Smoking status: Never Smoker  . Smokeless tobacco: Never Used  Vaping Use  . Vaping Use: Never used  Substance Use Topics  . Alcohol use: No  . Drug use: No    Home Medications Prior to Admission medications   Medication Sig Start Date End Date Taking? Authorizing Provider  albuterol (PROVENTIL) (2.5 MG/3ML) 0.083% nebulizer solution Take 3 mLs (2.5 mg total) by nebulization every 6 (six) hours as needed for wheezing or shortness of breath. 09/18/19   Vevelyn Francois, NP  albuterol (VENTOLIN HFA) 108 (90 Base) MCG/ACT inhaler INHALE 2 PUFFS INTO THE LUNGS EVERY 4 HOURS AS NEEDED FOR WHEEZING ORSHORTNESS OF BREATH. 09/18/19   Vevelyn Francois, NP  amitriptyline (ELAVIL) 25 MG tablet TAKE 1 TABLET BY MOUTH NIGHTLY AT BEDTIME 09/18/19   Vevelyn Francois, NP  cetirizine (ZYRTEC) 10 MG tablet Take 1 tablet (10 mg total) by mouth daily. 09/18/19   Vevelyn Francois, NP  Dexlansoprazole (DEXILANT) 30 MG capsule TAKE 1 CAPSULE (30MG  TOTAL) BY MOUTH DAILY. TAKE ON AN EMPTY STOMACH. 09/18/19   Vevelyn Francois, NP  EASY COMFORT PEN NEEDLES 32G X 4 MM MISC use as directed test 2 times daily 05/14/17   [provider]  EDARBYCLOR 40-25 MG TABS TAKE 1 TABLET BY MOUTH DAILY. 10/27/19 01/25/20  Vevelyn Francois, NP  Fluticasone-Salmeterol (ADVAIR DISKUS) 100-50 MCG/DOSE AEPB INHALE 1 PUFF INTO THE LUNGS EVERY 12 HOURS. 10/27/19   Vevelyn Francois, NP  ibuprofen (ADVIL) 200 MG tablet Take 400 mg by mouth every 6 (six) hours as needed for fever or headache (pain).    [provider]  insulin aspart (NOVOLOG FLEXPEN) 100 UNIT/ML FlexPen Inject 10-14 Units into the skin 3 (three) times daily before meals. 06/30/19   Azzie Glatter, FNP  insulin glargine (LANTUS SOLOSTAR) 100 UNIT/ML Solostar Pen Inject 50 Units into the skin 2 (two) times daily. 10/01/19   Vevelyn Francois, NP  Insulin Pen Needle (PEN NEEDLES) 32G X 5 MM MISC 100 each by Does not apply route 2 (two) times daily. 05/14/17   Dorena Dew, FNP  MAGNESIUM CITRATE PO Take 1 capsule by mouth every morning.     [provider]  MAXALT-MLT 10 MG disintegrating tablet TAKE 1 TABLET (10MG  TOTAL) BY MOUTH AS NEEDED FOR MIGRAINE. MAY REPEAT IN 2 HOURS IF NEEDED. MAX 30MG /24 HOURS. 07/21/19   Azzie Glatter, FNP  meloxicam (MOBIC) 15 MG tablet Take 1 tablet (15 mg total) by mouth daily as needed for pain. 09/18/19 12/17/19  Vevelyn Francois, NP  metFORMIN (GLUCOPHAGE) 500 MG tablet Take 1 tablet (500 mg total) by mouth 2 (two) times daily with a meal. 04/13/19 10/10/19  Azzie Glatter, FNP  montelukast (SINGULAIR) 10 MG tablet Take 1 tablet (10 mg total) by mouth daily. 09/18/19   Vevelyn Francois, NP  Psyllium (FIBER) 0.52 g CAPS Take 1 capsule by mouth at bedtime.     [provider]     Allergies    Corn-containing products, Honey, Mango flavor, Glipizide, and Victoza [liraglutide]  Review of Systems   Review of Systems  Cardiovascular: Positive for chest pain.  All other systems reviewed and are negative.   Physical Exam Updated Vital Signs BP 107/73 (BP Location: Right Arm)   Pulse 73   Temp 98.8 F (37.1 C)   Resp 19   Ht 5'  5" (1.651 m)   Wt 117.9 kg   LMP 10/06/2019   SpO2 100%   BMI 43.27 kg/m   Physical Exam Vitals and nursing note reviewed.  HENT:     Head: Normocephalic.     Nose: Nose normal.     Mouth/Throat:     Mouth: Mucous membranes are moist.  Eyes:     Extraocular Movements: Extraocular movements intact.     Pupils: Pupils are equal, round, and reactive to light.  Cardiovascular:     Rate and Rhythm: Normal rate and regular rhythm.     Pulses: Normal pulses.     Heart sounds: Normal heart sounds.  Pulmonary:     Effort: Pulmonary effort is normal.     Breath sounds: Normal breath sounds.     Comments: Mild reproducible R scapula tenderness and R chest wall tenderness  Abdominal:     General: Abdomen is flat.     Palpations: Abdomen is soft.  Musculoskeletal:        General: Normal range of motion.     Cervical back: Normal range of motion.  Skin:    General: Skin is warm.     Capillary Refill: Capillary refill takes less than 2 seconds.  Neurological:     General: No focal deficit present.     Mental Status: She is alert and oriented to person, place, and time.  Psychiatric:        Mood and Affect: Mood normal.        Behavior: Behavior normal.     ED Results / Procedures / Treatments   Labs (all labs ordered are listed, but only abnormal results are displayed) Labs Reviewed  BASIC METABOLIC PANEL - Abnormal; Notable for the following components:      Result Value   Glucose, Bld 284 (*)    Calcium 8.6 (*)    All other components within normal limits  CBG MONITORING, ED - Abnormal; Notable for the following  components:   Glucose-Capillary 247 (*)    All other components within normal limits  CBC  PREGNANCY, URINE  TROPONIN I (HIGH SENSITIVITY)  TROPONIN I (HIGH SENSITIVITY)    EKG EKG Interpretation  Date/Time:  Monday November 02 2019 15:00:47 EDT Ventricular Rate:  89 PR Interval:  168 QRS Duration: 72 QT Interval:  370 QTC Calculation: 450 R Axis:   37 Text Interpretation: Normal sinus rhythm Cannot rule out Anterior infarct , age undetermined Abnormal ECG No significant change since last tracing Confirmed by Wandra Arthurs [phone]) on 11/02/2019 8:14:34 PM   Radiology DG Chest 2 View  Result Date: 11/02/2019 CLINICAL DATA:  Hervey Ard central chest pain radiating to back, shortness of breath EXAM: CHEST - 2 VIEW COMPARISON:  03/28/2019 FINDINGS: The heart size and mediastinal contours are within normal limits. Both lungs are clear. The visualized skeletal structures are unremarkable. IMPRESSION: No active cardiopulmonary disease. Electronically Signed   By: Randa Ngo M.D.   On: 11/02/2019 15:37    Procedures Procedures (including critical care time)  Medications Ordered in ED Medications  sodium chloride flush (NS) 0.9 % injection 3 mL (3 mLs Intravenous Not Given 11/02/19 1721)  cyclobenzaprine (FLEXERIL) tablet 5 mg (has no administration in time range)    ED Course  I have reviewed the triage vital signs and the nursing notes.  Pertinent labs & imaging results that were available during my care of the patient were reviewed by me and considered in my medical decision making (see chart for  details).    MDM Rules/Calculators/A&P                          Lori Shea is a 44 y.o. female here presenting with right-sided chest pain.  It is atypical for ACS and reproducible.  Patient's troponin is negative x2 in the ED.  I doubt dissection or PE.  I think likely MSK pain .  She is diabetic so may have underlying heart disease.  Will have her get a stress test outpatient if she  has persistent pain. Will give motrin, flexeril for now   Final Clinical Impression(s) / ED Diagnoses Final diagnoses:  None    Rx / DC Orders ED Discharge Orders    None       Drenda Freeze, MD 11/02/19 2059

## 2019-11-02 NOTE — Discharge Instructions (Signed)
Take motrin and flexeril for pain. You likely have muscle spasms  See cardiology for stress test if you have persistent pain   Return to ER if you have worse chest pain, trouble breathing

## 2019-11-02 NOTE — ED Notes (Signed)
Pt teaching provided on medications that may cause drowsiness. Pt instructed not to drive or operate heavy machinery while taking the prescribed medication. Pt verbalized understanding.   

## 2019-11-06 ENCOUNTER — Encounter: Payer: Self-pay | Admitting: Nurse Practitioner

## 2019-11-06 ENCOUNTER — Other Ambulatory Visit: Payer: Self-pay

## 2019-11-06 ENCOUNTER — Ambulatory Visit (INDEPENDENT_AMBULATORY_CARE_PROVIDER_SITE_OTHER): Payer: Medicaid Other | Admitting: Nurse Practitioner

## 2019-11-06 VITALS — BP 106/81 | HR 83 | Temp 97.6°F | Ht 65.0 in | Wt 273.0 lb

## 2019-11-06 DIAGNOSIS — J4530 Mild persistent asthma, uncomplicated: Secondary | ICD-10-CM

## 2019-11-06 DIAGNOSIS — R1011 Right upper quadrant pain: Secondary | ICD-10-CM

## 2019-11-06 DIAGNOSIS — E1169 Type 2 diabetes mellitus with other specified complication: Secondary | ICD-10-CM | POA: Diagnosis not present

## 2019-11-06 DIAGNOSIS — Z8719 Personal history of other diseases of the digestive system: Secondary | ICD-10-CM

## 2019-11-06 DIAGNOSIS — E782 Mixed hyperlipidemia: Secondary | ICD-10-CM

## 2019-11-06 MED ORDER — SIMVASTATIN 5 MG PO TABS: 5.0000 mg | ORAL_TABLET | Freq: Every day | ORAL | 2 refills | Status: DC

## 2019-11-06 MED ORDER — MONTELUKAST SODIUM 10 MG PO TABS: 10.0000 mg | ORAL_TABLET | Freq: Every day | ORAL | 3 refills | Status: DC

## 2019-11-06 NOTE — Progress Notes (Signed)
Lori Shea, Lori Shea  [id number] Phone:  [phone]   Fax:  [phone]   Acute Office Visit  Subjective:    Patient ID: Lori Shea, female    DOB: 21-May-1975, 44 y.o.   MRN: [id number]  Chief Complaint  Patient presents with   Follow-up    ED 7/12 for chest pain; has since then gotten better. Now having SOB, shoulder and side pain with some nausea.     HPI Patient is in today for follow up from the emergency room. She  has a past medical history of Allergy, Asthma (age 89 yo), Cholelithiasis (age 14 yo), COVID-19, Diabetes mellitus (2012), Elevated liver enzymes (09/13/2016), History of kidney stones, Hypertension (2015), Kidney stones, Migraines (2009), Morbid obesity ([place]), and PCOS (polycystic ovarian syndrome) (2010).   CBG 150 on the 40's  She admits that on 11/10/2019 for right-sided chest pain.  ED work-up was negative.  She was diagnosed with musculoskeletal pain.  Recommendation pain persists for evaluation with stress test.  She admits that the chest pain has resolved.  Abdominal Pain Patient complains of abdominal pain. The pain is described as aching, and is varies in intensity. The patient is experiencing RUQ pain with radiation to right groin. Onset was several years ago. Symptoms have been gradually worsening. Aggravating factors: eating and fatty foods.  Alleviating factors: none. Associated symptoms: myalgias and nausea. The patient denies anorexia, chills, dysuria, fever, frequency, headache, hematochezia, hematuria, melena and sweats.  She admits that she has been diagnosed with gallstones on several occasions.  She was going to have the surgery to have her gallbladder removed.  However she has declined on 2 occasions.  She now feels like she needs the surgery.  02/10/2019 CT abdomen pelvis did not indicate any gallstones.  Renal ultrasound on 10/20/2019 indicates gallstones.  Past Medical History:  Diagnosis Date    Allergy    Foods:  mango, honey, corn.  Allergic to "everything tested"  15 years ago in [place] age 69 yo   Triggers:  cold weather, exercise, allergens, anxiety, URIs   Cholelithiasis age 77 yo   Asymptomatic   COVID-19    Diabetes mellitus 2012   Was diagnosed 2 years earlier with PCOS   Elevated liver enzymes 09/13/2016   History of kidney stones    Hypertension 2015   Kidney stones    family history of renal failure   Migraines 2009   Morbid obesity ([place])    PCOS (polycystic ovarian syndrome) 2010    Past Surgical History:  Procedure Laterality Date   COLPOSCOPY N/A    LAPAROSCOPIC TUBAL LIGATION Bilateral 01/28/2019   Procedure: LAPAROSCOPIC TUBAL LIGATION AND PAP SMEAR;  Surgeon: Aletha Halim, MD;  Location: [facility];  Service: Gynecology;  Laterality: Bilateral;   TONSILLECTOMY AND ADENOIDECTOMY Bilateral 12/23/2018   Procedure: TONSILLECTOMY AND ADENOIDECTOMY;  Surgeon: Leta Baptist, MD;  Location: [facility];  Service: ENT;  Laterality: Bilateral;    Family History  Problem Relation Age of Onset   Diabetes Father    Hyperlipidemia Father    Hypertension Father    Heart disease Father    Kidney disease Father        Developed after 2nd CABG   Diabetes Paternal Grandfather    Heart disease Paternal Grandfather    Hyperlipidemia Paternal Grandfather    Hypertension Paternal Grandfather    Kidney disease Paternal Grandfather     Social  History   Socioeconomic History   Marital status: Divorced    Spouse name: Not on file   Number of children: 1   Years of education: some comm. college   Highest education level: Not on file  Occupational History   Occupation: CNA    Comment: [employer]  Tobacco Use   Smoking status: Never Smoker   Smokeless tobacco: Never Used  Scientific laboratory technician Use: Never used  Substance and Sexual Activity   Alcohol use: No   Drug use: No   Sexual  activity: Yes    Birth control/protection: None    Comment: [employer]  Other Topics Concern   Not on file  Social History Narrative   Originally from [place], [place] to [place] in 1988.   In [place] in [place] at home with daughter on [address].   Social Determinants of Health   Financial Resource Strain:    Difficulty of Paying Living Expenses:   Food Insecurity:    Worried About Charity fundraiser in the Last Year:    Arboriculturist in the Last Year:   Transportation Needs:    Film/video editor (Medical):    Lack of Transportation (Non-Medical):   Physical Activity:    Days of Exercise per Week:    Minutes of Exercise per Session:   Stress:    Feeling of Stress :   Social Connections:    Frequency of Communication with Friends and Family:    Frequency of Social Gatherings with Friends and Family:    Attends Religious Services:    Active Member of Clubs or Organizations:    Attends Music therapist:    Marital Status:   Intimate Partner Violence:    Fear of Current or Ex-Partner:    Emotionally Abused:    Physically Abused:    Sexually Abused:     Outpatient Medications Prior to Visit  Medication Sig Dispense Refill   albuterol (PROVENTIL) (2.5 MG/3ML) 0.083% nebulizer solution Take 3 mLs (2.5 mg total) by nebulization every 6 (six) hours as needed for wheezing or shortness of breath. 75 mL 6   albuterol (VENTOLIN HFA) 108 (90 Base) MCG/ACT inhaler INHALE 2 PUFFS INTO THE LUNGS EVERY 4 HOURS AS NEEDED FOR WHEEZING ORSHORTNESS OF BREATH. 6.7 g 0   amitriptyline (ELAVIL) 25 MG tablet TAKE 1 TABLET BY MOUTH NIGHTLY AT BEDTIME 30 tablet 5   cetirizine (ZYRTEC) 10 MG tablet Take 1 tablet (10 mg total) by mouth daily. 30 tablet 11   cyclobenzaprine (FLEXERIL) 5 MG tablet Take 1 tablet (5 mg total) by mouth 3 (three) times daily as needed for muscle spasms. 10 tablet 0   Dexlansoprazole (DEXILANT) 30 MG  capsule TAKE 1 CAPSULE (30MG  TOTAL) BY MOUTH DAILY. TAKE ON AN EMPTY STOMACH. 90 capsule 0   EASY COMFORT PEN NEEDLES 32G X 4 MM MISC use as directed test 2 times daily  3   EDARBYCLOR 40-25 MG TABS TAKE 1 TABLET BY MOUTH DAILY. 30 tablet 0   Fluticasone-Salmeterol (ADVAIR DISKUS) 100-50 MCG/DOSE AEPB INHALE 1 PUFF INTO THE LUNGS EVERY 12 HOURS. 2 each 0   ibuprofen (ADVIL) 800 MG tablet Take 1 tablet (800 mg total) by mouth 3 (three) times daily. 21 tablet 0   insulin aspart (NOVOLOG FLEXPEN) 100 UNIT/ML FlexPen Inject 10-14 Units into the skin 3 (three) times daily before meals. 30 mL 5   insulin glargine (LANTUS SOLOSTAR) 100  UNIT/ML Solostar Pen Inject 50 Units into the skin 2 (two) times daily. 10 pen 11   Insulin Pen Needle (PEN NEEDLES) 32G X 5 MM MISC 100 each by Does not apply route 2 (two) times daily. 100 each 3   MAGNESIUM CITRATE PO Take 1 capsule by mouth every morning.      MAXALT-MLT 10 MG disintegrating tablet TAKE 1 TABLET (10MG  TOTAL) BY MOUTH AS NEEDED FOR MIGRAINE. MAY REPEAT IN 2 HOURS IF NEEDED. MAX 30MG /24 HOURS. 12 tablet 0   meloxicam (MOBIC) 15 MG tablet Take 1 tablet (15 mg total) by mouth daily as needed for pain. 30 tablet 2   Psyllium (FIBER) 0.52 g CAPS Take 1 capsule by mouth at bedtime.      metFORMIN (GLUCOPHAGE) 500 MG tablet Take 1 tablet (500 mg total) by mouth 2 (two) times daily with a meal. 60 tablet 5   montelukast (SINGULAIR) 10 MG tablet Take 1 tablet (10 mg total) by mouth daily. (Patient not taking: Reported on 11/06/2019) 30 tablet 5   No facility-administered medications prior to visit.    Allergies  Allergen Reactions   Corn-Containing Products Hives    "really bad asthma attacks"   Honey Shortness Of Breath   Mango Flavor Hives and Swelling    Pt allergic to all mango - tongue and throat swelling   Glipizide    Victoza [Liraglutide] Nausea And Vomiting    Review of Systems  Respiratory: Positive for shortness of breath.    Gastrointestinal: Positive for nausea.       Right upper quadrant abdominal pain  Musculoskeletal: Positive for back pain.       Objective:    Physical Exam Constitutional:      General: She is not in acute distress.    Appearance: She is obese. She is not ill-appearing or toxic-appearing.  HENT:     Head: Normocephalic.     Nose: Nose normal.     Mouth/Throat:     Mouth: Mucous membranes are moist.  Cardiovascular:     Rate and Rhythm: Normal rate and regular rhythm.     Pulses: Normal pulses.     Heart sounds: Normal heart sounds.  Pulmonary:     Effort: Pulmonary effort is normal.     Comments: Diminished breath sounds Abdominal:     Palpations: Abdomen is soft.     Tenderness: There is no right CVA tenderness or left CVA tenderness.     Comments: Murphy sign negative  Musculoskeletal:        General: Normal range of motion.     Cervical back: Normal range of motion.  Skin:    General: Skin is warm and dry.     Capillary Refill: Capillary refill takes less than 2 seconds.  Neurological:     General: No focal deficit present.     Mental Status: She is alert and oriented to person, place, and time.  Psychiatric:        Mood and Affect: Mood normal.        Behavior: Behavior normal.        Thought Content: Thought content normal.        Judgment: Judgment normal.     BP 106/81    Pulse 83    Temp 97.6 F (36.4 C)    Ht 5\' 5"  (1.651 m)    Wt 273 lb 0.2 oz (123.8 kg)    SpO2 99%    BMI 45.43 kg/m  Wt Readings from  Last 3 Encounters:  11/06/19 273 lb 0.2 oz (123.8 kg)  11/02/19 260 lb (117.9 kg)  10/01/19 270 lb 0.2 oz (122.5 kg)    Health Maintenance Due  Topic Date Due   OPHTHALMOLOGY EXAM  Never done   COVID-19 Vaccine (1) Never done   HIV Screening  Never done   FOOT EXAM  03/15/2019    There are no preventive care reminders to display for this patient.   Lab Results  Component Value Date   TSH 5.090 (H) 02/10/2018   Lab Results  Component  Value Date   WBC 10.4 11/02/2019   HGB 13.3 11/02/2019   HCT 40.5 11/02/2019   MCV 87.3 11/02/2019   PLT 301 11/02/2019   Lab Results  Component Value Date   NA 135 11/02/2019   K 3.5 11/02/2019   CO2 23 11/02/2019   GLUCOSE 284 (H) 11/02/2019   BUN 13 11/02/2019   CREATININE 0.72 11/02/2019   BILITOT 0.4 09/18/2019   ALKPHOS 109 09/18/2019   AST 23 09/18/2019   ALT 29 02/01/2019   PROT 6.5 09/18/2019   ALBUMIN 4.0 09/18/2019   CALCIUM 8.6 (L) 11/02/2019   ANIONGAP 12 11/02/2019   Lab Results  Component Value Date   CHOL 182 09/18/2019   Lab Results  Component Value Date   HDL 40 09/18/2019   Lab Results  Component Value Date   LDLCALC 112 (H) 09/18/2019   Lab Results  Component Value Date   TRIG 169 (H) 09/18/2019   Lab Results  Component Value Date   CHOLHDL 4.6 (H) 09/18/2019   Lab Results  Component Value Date   HGBA1C 11.7 (A) 09/18/2019       Assessment & Plan:   Problem List Items Addressed This Visit      Respiratory   Asthma - Primary   Relevant Medications   montelukast (SINGULAIR) 10 MG tablet Encourage patient to restart Singulair which may help better control her asthma symptoms    Other Visit Diagnoses    Right upper quadrant abdominal pain       Relevant Orders   Ambulatory referral to General Surgery   Personal history of gallstones       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    Dispense:  90 tablet    Refill:  3    Order Specific Question:   Supervising Provider    Answer:   Tresa Garter [[id number]]   simvastatin (ZOCOR) 5 MG tablet    Sig: Take 1 tablet (5 mg total) by mouth at bedtime.    Dispense:  30 tablet    Refill:  2      Order Specific Question:   Supervising Provider    Answer:   Tresa Garter [[id number]]     Vevelyn Francois, NP

## 2019-11-16 NOTE — Telephone Encounter (Signed)
Please let her know that she may have to go to the ER because this is chronic condition. I can not prescribe anything additional. I do apologize.

## 2019-11-18 ENCOUNTER — Other Ambulatory Visit: Payer: Self-pay

## 2019-11-18 ENCOUNTER — Emergency Department (HOSPITAL_COMMUNITY)
Admission: EM | Admit: 2019-11-18 | Discharge: 2019-11-19 | Disposition: A | Discharge status: Home / Self Care | Payer: Medicaid Other | Attending: Emergency Medicine | Admitting: Emergency Medicine

## 2019-11-18 DIAGNOSIS — Z79899 Other long term (current) drug therapy: Secondary | ICD-10-CM | POA: Diagnosis not present

## 2019-11-18 DIAGNOSIS — D849 Immunodeficiency, unspecified: Secondary | ICD-10-CM | POA: Diagnosis not present

## 2019-11-18 DIAGNOSIS — R1011 Right upper quadrant pain: Secondary | ICD-10-CM

## 2019-11-18 DIAGNOSIS — M549 Dorsalgia, unspecified: Secondary | ICD-10-CM | POA: Diagnosis not present

## 2019-11-18 DIAGNOSIS — J45909 Unspecified asthma, uncomplicated: Secondary | ICD-10-CM | POA: Insufficient documentation

## 2019-11-18 DIAGNOSIS — N390 Urinary tract infection, site not specified: Secondary | ICD-10-CM

## 2019-11-18 DIAGNOSIS — R110 Nausea: Secondary | ICD-10-CM | POA: Insufficient documentation

## 2019-11-18 DIAGNOSIS — E119 Type 2 diabetes mellitus without complications: Secondary | ICD-10-CM | POA: Diagnosis not present

## 2019-11-18 DIAGNOSIS — Z794 Long term (current) use of insulin: Secondary | ICD-10-CM | POA: Insufficient documentation

## 2019-11-18 DIAGNOSIS — I10 Essential (primary) hypertension: Secondary | ICD-10-CM | POA: Diagnosis not present

## 2019-11-18 LAB — URINALYSIS, ROUTINE W REFLEX MICROSCOPIC
Bilirubin Urine: NEGATIVE
Glucose, UA: 50 mg/dL — AB
Hgb urine dipstick: NEGATIVE
Ketones, ur: 5 mg/dL — AB
Nitrite: POSITIVE — AB
Protein, ur: NEGATIVE mg/dL
Specific Gravity, Urine: 1.03 (ref 1.005–1.030)
pH: 5 (ref 5.0–8.0)

## 2019-11-18 LAB — COMPREHENSIVE METABOLIC PANEL
ALT: 27 U/L (ref 0–44)
AST: 20 U/L (ref 15–41)
Albumin: 3.9 g/dL (ref 3.5–5.0)
Alkaline Phosphatase: 79 U/L (ref 38–126)
Anion gap: 10 (ref 5–15)
BUN: 23 mg/dL — ABNORMAL HIGH (ref 6–20)
CO2: 25 mmol/L (ref 22–32)
Calcium: 9.4 mg/dL (ref 8.9–10.3)
Chloride: 104 mmol/L (ref 98–111)
Creatinine, Ser: 0.83 mg/dL (ref 0.44–1.00)
GFR calc Af Amer: >60 mL/min (ref >60)
GFR calc non Af Amer: >60 mL/min (ref >60)
Glucose, Bld: 276 mg/dL — ABNORMAL HIGH (ref 70–99)
Potassium: 3.4 mmol/L — ABNORMAL LOW (ref 3.5–5.1)
Sodium: 139 mmol/L (ref 135–145)
Total Bilirubin: 0.6 mg/dL (ref 0.3–1.2)
Total Protein: 7.1 g/dL (ref 6.5–8.1)

## 2019-11-18 LAB — CBC WITH DIFFERENTIAL/PLATELET
Abs Immature Granulocytes: 0.03 10*3/uL (ref 0.00–0.07)
Basophils Absolute: 0.1 10*3/uL (ref 0.0–0.1)
Basophils Relative: 1 %
Eosinophils Absolute: 0.2 10*3/uL (ref 0.0–0.5)
Eosinophils Relative: 2 %
HCT: 39.7 % (ref 36.0–46.0)
Hemoglobin: 13.2 g/dL (ref 12.0–15.0)
Immature Granulocytes: 0 %
Lymphocytes Relative: 34 %
Lymphs Abs: 3.5 10*3/uL (ref 0.7–4.0)
MCH: 28.9 pg (ref 26.0–34.0)
MCHC: 33.2 g/dL (ref 30.0–36.0)
MCV: 87.1 fL (ref 80.0–100.0)
Monocytes Absolute: 0.6 10*3/uL (ref 0.1–1.0)
Monocytes Relative: 6 %
Neutro Abs: 6 10*3/uL (ref 1.7–7.7)
Neutrophils Relative %: 57 %
Platelets: 325 10*3/uL (ref 150–400)
RBC: 4.56 MIL/uL (ref 3.87–5.11)
RDW: 13.7 % (ref 11.5–15.5)
WBC: 10.4 10*3/uL (ref 4.0–10.5)
nRBC: 0 % (ref 0.0–0.2)

## 2019-11-18 LAB — LIPASE, BLOOD: Lipase: 26 U/L (ref 11–51)

## 2019-11-18 LAB — I-STAT BETA HCG BLOOD, ED (MC, WL, AP ONLY): I-stat hCG, quantitative: <5 m[IU]/mL (ref <5)

## 2019-11-18 MED ORDER — SODIUM CHLORIDE 0.9 % IV BOLUS
1000.0000 mL | Freq: Once | INTRAVENOUS | Status: AC
  Administered 2019-11-18: 1000 mL via INTRAVENOUS

## 2019-11-18 MED ORDER — HYDROMORPHONE HCL 1 MG/ML IJ SOLN
1.0000 mg | Freq: Once | INTRAMUSCULAR | Status: AC
  Administered 2019-11-18: 1 mg via INTRAVENOUS
  Filled 2019-11-18: qty 1

## 2019-11-18 MED ORDER — HYDROCODONE-ACETAMINOPHEN 5-325 MG PO TABS: 2.0000 | ORAL_TABLET | ORAL | 0 refills | Status: DC | PRN

## 2019-11-18 MED ORDER — CEPHALEXIN 500 MG PO CAPS: 500.0000 mg | ORAL_CAPSULE | Freq: Two times a day (BID) | ORAL | 0 refills | Status: AC

## 2019-11-18 MED ORDER — PROMETHAZINE HCL 25 MG PO TABS
25.0000 mg | ORAL_TABLET | Freq: Four times a day (QID) | ORAL | 0 refills | Status: DC | PRN
Start: 2019-11-18 — End: 2020-05-06

## 2019-11-18 MED ORDER — ONDANSETRON HCL 4 MG/2ML IJ SOLN
4.0000 mg | Freq: Once | INTRAMUSCULAR | Status: AC
  Administered 2019-11-18: 4 mg via INTRAVENOUS
  Filled 2019-11-18: qty 2

## 2019-11-18 MED ORDER — SODIUM CHLORIDE 0.9 % IV SOLN
1.0000 g | Freq: Once | INTRAVENOUS | Status: AC
  Administered 2019-11-18: 1 g via INTRAVENOUS
  Filled 2019-11-18: qty 10

## 2019-11-18 NOTE — ED Provider Notes (Signed)
[facility] DEPT Provider Note   CSN: [id number] Arrival date & time: 11/18/19  2110     History Chief Complaint  Patient presents with  . Abdominal Pain    Lori Shea is a 44 y.o. female.  44 year old female with history of migraines, diabetes, post COVID syndrome, cholelithiasis. Known gallstones x 20 years, intermittent problems but worse for the past few months.  Patient is scheduled to see surgery in the office tomorrow. Pain started this morning, intermittent, worse since 7PM after eating bacon, egg and cheese biscuit as well as a cheese quesodilla. Pain is better lying on her left side. Associated with nausea, no vomiting, denies fevers, chills, changes in bowel or bladder habits.  Prior abdominal surgeries include tubuligation.         Past Medical History:  Diagnosis Date  . Allergy    Foods:  mango, honey, corn.  Allergic to "everything tested"  15 years ago in [place]  . Asthma age 21 yo   Triggers:  cold weather, exercise, allergens, anxiety, URIs  . Cholelithiasis age 6 yo   Asymptomatic  . COVID-19   . Diabetes mellitus 2012   Was diagnosed 2 years earlier with PCOS  . Elevated liver enzymes 09/13/2016  . History of kidney stones   . Hypertension 2015  . Kidney stones    family history of renal failure  . Migraines 2009  . Morbid obesity ([place])   . PCOS (polycystic ovarian syndrome) 2010    Patient Active Problem List   Diagnosis Date Noted  . Hemoglobin A1C greater than 9%, indicating poor diabetic control 03/23/2019  . Hyperglycemia 03/23/2019  . Class 3 severe obesity due to excess calories with serious comorbidity and body mass index (BMI) of 40.0 to 44.9 in adult ([place]) 03/23/2019  . Yeast infection 03/23/2019  . BMI 40.0-44.9, adult ([place]) 03/09/2019  . Cervical dysplasia 07/31/2017  . Alopecia of scalp 05/09/2017  . Pain in left toe(s) 12/27/2016  . Closed avulsion fracture of lateral malleolus of right  fibula 12/27/2016  . Morbid obesity ([place]) 09/13/2016  . Elevated liver enzymes 09/13/2016  . Allergy   . Asthma   . Hypertension 04/23/2013  . Type 2 diabetes mellitus with hyperglycemia, with long-term current use of insulin ([place]) 04/23/2010  . PCOS (polycystic ovarian syndrome) 04/23/2008  . Migraines 04/24/2007    Past Surgical History:  Procedure Laterality Date  . COLPOSCOPY N/A   . LAPAROSCOPIC TUBAL LIGATION Bilateral 01/28/2019   Procedure: LAPAROSCOPIC TUBAL LIGATION AND PAP SMEAR;  Surgeon: Aletha Halim, MD;  Location: [facility];  Service: Gynecology;  Laterality: Bilateral;  . TONSILLECTOMY AND ADENOIDECTOMY Bilateral 12/23/2018   Procedure: TONSILLECTOMY AND ADENOIDECTOMY;  Surgeon: Leta Baptist, MD;  Location: [facility];  Service: ENT;  Laterality: Bilateral;     OB History    Gravida  4   Para  1   Term  1   Preterm      AB  3   Living  1     SAB  3   TAB      Ectopic      Multiple      Live Births              Family History  Problem Relation Age of Onset  . Diabetes Father   . Hyperlipidemia Father   . Hypertension Father   . Heart disease Father   . Kidney disease Father  Developed after 2nd CABG  . Diabetes Paternal Grandfather   . Heart disease Paternal Grandfather   . Hyperlipidemia Paternal Grandfather   . Hypertension Paternal Grandfather   . Kidney disease Paternal Grandfather     Social History   Tobacco Use  . Smoking status: Never Smoker  . Smokeless tobacco: Never Used  Vaping Use  . Vaping Use: Never used  Substance Use Topics  . Alcohol use: No  . Drug use: No    Home Medications Prior to Admission medications   Medication Sig Start Date End Date Taking? Authorizing Provider  albuterol (PROVENTIL) (2.5 MG/3ML) 0.083% nebulizer solution Take 3 mLs (2.5 mg total) by nebulization every 6 (six) hours as needed for wheezing or shortness of breath. 09/18/19  Yes Vevelyn Francois, NP    albuterol (VENTOLIN HFA) 108 (90 Base) MCG/ACT inhaler INHALE 2 PUFFS INTO THE LUNGS EVERY 4 HOURS AS NEEDED FOR WHEEZING ORSHORTNESS OF BREATH. Patient taking differently: Inhale 2 puffs into the lungs every 4 (four) hours as needed for wheezing or shortness of breath.  09/18/19  Yes Vevelyn Francois, NP  amitriptyline (ELAVIL) 25 MG tablet TAKE 1 TABLET BY MOUTH NIGHTLY AT BEDTIME Patient taking differently: Take 25 mg by mouth at bedtime.  11/09/19  Yes Vevelyn Francois, NP  cetirizine (ZYRTEC) 10 MG tablet Take 1 tablet (10 mg total) by mouth daily. 09/18/19  Yes Vevelyn Francois, NP  cyclobenzaprine (FLEXERIL) 5 MG tablet Take 1 tablet (5 mg total) by mouth 3 (three) times daily as needed for muscle spasms. 11/02/19  Yes Drenda Freeze, MD  Dexlansoprazole (DEXILANT) 30 MG capsule TAKE 1 CAPSULE (30MG  TOTAL) BY MOUTH DAILY. TAKE ON AN EMPTY STOMACH. Patient taking differently: Take 30 mg by mouth daily. TAKE ON AN EMPTY STOMACH. 09/18/19  Yes King, Diona Foley, NP  EDARBYCLOR 40-25 MG TABS TAKE 1 TABLET BY MOUTH DAILY. 10/27/19 01/25/20 Yes King, Diona Foley, NP  Fluticasone-Salmeterol (ADVAIR DISKUS) 100-50 MCG/DOSE AEPB INHALE 1 PUFF INTO THE LUNGS EVERY 12 HOURS. 10/27/19  Yes Vevelyn Francois, NP  ibuprofen (ADVIL) 800 MG tablet Take 1 tablet (800 mg total) by mouth 3 (three) times daily. 11/02/19  Yes Drenda Freeze, MD  insulin aspart (NOVOLOG FLEXPEN) 100 UNIT/ML FlexPen Inject 10-14 Units into the skin 3 (three) times daily before meals. 06/30/19  Yes Azzie Glatter, FNP  insulin glargine (LANTUS SOLOSTAR) 100 UNIT/ML Solostar Pen Inject 50 Units into the skin 2 (two) times daily. Patient taking differently: Inject 40 Units into the skin 2 (two) times daily.  10/01/19  Yes King, Diona Foley, NP  MAGNESIUM CITRATE PO Take 1 capsule by mouth every morning.    Yes [provider]  MAXALT-MLT 10 MG disintegrating tablet TAKE 1 TABLET (10MG  TOTAL) BY MOUTH AS NEEDED FOR MIGRAINE. MAY REPEAT IN 2  HOURS IF NEEDED. MAX 30MG /24 HOURS. Patient taking differently: Take 10 mg by mouth as needed for migraine. May repeat in 2 hours if needed. Max of 30MG /24 hours 07/21/19  Yes Azzie Glatter, FNP  meloxicam (MOBIC) 15 MG tablet Take 1 tablet (15 mg total) by mouth daily as needed for pain. 09/18/19 12/17/19 Yes Vevelyn Francois, NP  metFORMIN (GLUCOPHAGE) 500 MG tablet Take 1 tablet (500 mg total) by mouth 2 (two) times daily with a meal. 04/13/19 11/18/19 Yes Azzie Glatter, FNP  montelukast (SINGULAIR) 10 MG tablet Take 1 tablet (10 mg total) by mouth daily. 11/06/19 10/31/20 Yes Vevelyn Francois, NP  Psyllium (FIBER) 0.52 g CAPS Take 1 capsule by mouth at bedtime.    Yes [provider]  simvastatin (ZOCOR) 5 MG tablet Take 1 tablet (5 mg total) by mouth at bedtime. 11/06/19 02/04/20 Yes Vevelyn Francois, NP  amitriptyline (ELAVIL) 25 MG tablet TAKE 1 TABLET BY MOUTH NIGHTLY AT BEDTIME Patient not taking: Reported on 11/18/2019 09/18/19   Vevelyn Francois, NP  cephALEXin (KEFLEX) 500 MG capsule Take 1 capsule (500 mg total) by mouth 2 (two) times daily for 5 days. 11/18/19 11/23/19  Tacy Learn, PA-C  EASY COMFORT PEN NEEDLES 32G X 4 MM MISC use as directed test 2 times daily 05/14/17   [provider]  HYDROcodone-acetaminophen (NORCO/VICODIN) 5-325 MG tablet Take 2 tablets by mouth every 4 (four) hours as needed. 11/18/19   Tacy Learn, PA-C  Insulin Pen Needle (PEN NEEDLES) 32G X 5 MM MISC 100 each by Does not apply route 2 (two) times daily. 05/14/17   Dorena Dew, FNP  promethazine (PHENERGAN) 25 MG tablet Take 1 tablet (25 mg total) by mouth every 6 (six) hours as needed for nausea or vomiting. 11/18/19   Tacy Learn, PA-C    Allergies    Corn-containing products, Honey, Mango flavor, Glipizide, and Victoza [liraglutide]  Review of Systems   Review of Systems  Constitutional: Negative for chills and fever.  Respiratory: Negative for shortness of breath.     Cardiovascular: Negative for chest pain.  Gastrointestinal: Positive for abdominal pain and nausea. Negative for constipation, diarrhea and vomiting.  Genitourinary: Negative for dysuria.  Musculoskeletal: Positive for back pain.  Skin: Negative for rash and wound.  Allergic/Immunologic: Positive for immunocompromised state.  Neurological: Negative for weakness.  All other systems reviewed and are negative.   Physical Exam Updated Vital Signs BP 120/77   Pulse 81   Temp 98 F (36.7 C) (Oral)   Resp 17   Ht 5\' 5"  (1.651 m)   Wt (!) 117.9 kg   SpO2 97%   BMI 43.27 kg/m   Physical Exam Vitals and nursing note reviewed.  Constitutional:      General: She is not in acute distress.    Appearance: She is well-developed. She is not diaphoretic.  HENT:     Head: Normocephalic and atraumatic.  Cardiovascular:     Rate and Rhythm: Normal rate and regular rhythm.     Heart sounds: Normal heart sounds.  Pulmonary:     Effort: Pulmonary effort is normal.     Breath sounds: Normal breath sounds.  Abdominal:     General: Bowel sounds are normal.     Palpations: Abdomen is soft.     Tenderness: There is abdominal tenderness in the right upper quadrant. There is no right CVA tenderness or left CVA tenderness. Negative signs include Danisha Brassfield's sign.  Skin:    General: Skin is warm and dry.     Findings: No erythema or rash.  Neurological:     Mental Status: She is alert and oriented to person, place, and time.  Psychiatric:        Behavior: Behavior normal.     ED Results / Procedures / Treatments   Labs (all labs ordered are listed, but only abnormal results are displayed) Labs Reviewed  COMPREHENSIVE METABOLIC PANEL - Abnormal; Notable for the following components:      Result Value   Potassium 3.4 (*)    Glucose, Bld 276 (*)    BUN 23 (*)  All other components within normal limits  URINALYSIS, ROUTINE W REFLEX MICROSCOPIC - Abnormal; Notable for the following components:    Color, Urine AMBER (*)    APPearance CLOUDY (*)    Glucose, UA 50 (*)    Ketones, ur 5 (*)    Nitrite POSITIVE (*)    Leukocytes,Ua TRACE (*)    Bacteria, UA MANY (*)    All other components within normal limits  LIPASE, BLOOD  CBC WITH DIFFERENTIAL/PLATELET  I-STAT BETA HCG BLOOD, ED ([facility], [facility], [facility] ONLY)    EKG None  Radiology No results found.  Procedures Procedures (including critical care time)  Medications Ordered in ED Medications  cefTRIAXone (ROCEPHIN) 1 g in sodium chloride 0.9 % 100 mL IVPB (1 g Intravenous New Bag/Given 11/18/19 2326)  sodium chloride 0.9 % bolus 1,000 mL (1,000 mLs Intravenous New Bag/Given 11/18/19 2327)  HYDROmorphone (DILAUDID) injection 1 mg (1 mg Intravenous Given 11/18/19 2325)  ondansetron (ZOFRAN) injection 4 mg (4 mg Intravenous Given 11/18/19 2323)    ED Course  I have reviewed the triage vital signs and the nursing notes.  Pertinent labs & imaging results that were available during my care of the patient were reviewed by me and considered in my medical decision making (see chart for details).  Clinical Course as of Nov 17 2348  Wed Nov 18, 3751  8725 44 year old female with history of cholelithiasis presents with RUQ pain, radiates to LUQ and into right scapula, associated with nausea. Denies vomiting, fevers, chills, changes in bowel or bladder habits.    [LM]  2300 On exam, patient appears uncomfortable, has mild tenderness right upper quadrant, negative Elesia Pemberton sign. Labs are reassuring including CBC with normal white blood cell count, CMP with normal LFTs and total bili. Patient does have elevated glucose at 276, known diagnosis of diabetes, A1c on last check was 11, lipase within normal meds. hCG is negative. Urinalysis concerning for UTI, urine is nitrate positive for leukocytes and many bacteria, not contaminated sample. Plan is to give pain medication with antiemetic, will also give Rocephin for UTI in this poorly controlled diabetic  patient. If pain is controlled, plan is to discharge to follow-up with her surgeon tomorrow. Ultrasound is not available at this time, do not suspect acute cholecystitis with consideration of vitals. Labs, exam. Discussed dietary measures to control pain, suspect that her quesadilla and biscuit intake today may have caused her episode of pain.   [LM]  2349 Patient is feeling better after IV pain medication.  Discussed lab results with patient including her urinalysis.  Patient states that she does have a slight discomfort with voiding and mild low back pain.  Patient understands to return to the ER for fevers, worsening or concerning symptoms otherwise follow-up with her surgeon tomorrow.  Given prescription for Norco for pain as well as Phenergan for nausea as Zofran gives her headaches.   [LM]    Clinical Course User Index [LM] Roque Lias   MDM Rules/Calculators/A&P                          Final Clinical Impression(s) / ED Diagnoses Final diagnoses:  Right upper quadrant abdominal pain  Urinary tract infection in female    Rx / DC Orders ED Discharge Orders         Ordered    cephALEXin (KEFLEX) 500 MG capsule  2 times daily     Discontinue  Reprint  11/18/19 2347    promethazine (PHENERGAN) 25 MG tablet  Every 6 hours PRN     Discontinue  Reprint     11/18/19 2347    HYDROcodone-acetaminophen (NORCO/VICODIN) 5-325 MG tablet  Every 4 hours PRN     Discontinue  Reprint     11/18/19 2347           Tacy Learn, PA-C 11/18/19 2350    Quintella Reichert, MD 11/19/19 1708

## 2019-11-18 NOTE — Discharge Instructions (Addendum)
Follow-up with your surgeon tomorrow as scheduled.  Return to ER for fevers, intractable pain or vomiting or other concerns. Take Keflex as prescribed and complete the full course. Take Norco as needed as prescribed for pain. Take phenergan as needed as prescribed for nausea. Do not drive while taking Norco. Take Colace for constipation.

## 2019-11-18 NOTE — ED Provider Notes (Signed)
Care assumed from [facility].  Please see her full H&P.  In short,  Lori Shea is a 44 y.o. female presents for abdominal pain onset around 7pm after eating a greasy meal.  Nausea without vomiting.  Pt feeling better after medication and labs reassuring. No [place] at this time.  Will PO trial and reassess.  Physical Exam  BP 120/77   Pulse 81   Temp 98 F (36.7 C) (Oral)   Resp 17   Ht 5\' 5"  (1.651 m)   Wt (!) 117.9 kg   SpO2 97%   BMI 43.27 kg/m   Physical Exam Vitals and nursing note reviewed.  Constitutional:      General: She is not in acute distress.    Appearance: She is well-developed.  HENT:     Head: Normocephalic.  Eyes:     General: No scleral icterus.    Conjunctiva/sclera: Conjunctivae normal.  Cardiovascular:     Rate and Rhythm: Normal rate.  Pulmonary:     Effort: Pulmonary effort is normal.  Abdominal:     Tenderness: There is abdominal tenderness in the right upper quadrant. There is guarding. Positive signs include Murphy's sign.  Musculoskeletal:        General: Normal range of motion.     Cervical back: Normal range of motion.  Skin:    General: Skin is warm and dry.  Neurological:     Mental Status: She is alert.     ED Course/Procedures   Clinical Course as of Nov 19 54  Wed Nov 17, 5452  8745 44 year old female with history of cholelithiasis presents with RUQ pain, radiates to LUQ and into right scapula, associated with nausea. Denies vomiting, fevers, chills, changes in bowel or bladder habits.    [LM]  2300 On exam, patient appears uncomfortable, has mild tenderness right upper quadrant, negative Murphy sign. Labs are reassuring including CBC with normal white blood cell count, CMP with normal LFTs and total bili. Patient does have elevated glucose at 276, known diagnosis of diabetes, A1c on last check was 11, lipase within normal meds. hCG is negative. Urinalysis concerning for UTI, urine is nitrate positive for leukocytes and many  bacteria, not contaminated sample. Plan is to give pain medication with antiemetic, will also give Rocephin for UTI in this poorly controlled diabetic patient. If pain is controlled, plan is to discharge to follow-up with her surgeon tomorrow. Ultrasound is not available at this time, do not suspect acute cholecystitis with consideration of vitals. Labs, exam. Discussed dietary measures to control pain, suspect that her quesadilla and biscuit intake today may have caused her episode of pain.   [LM]  2349 Patient is feeling better after IV pain medication.  Discussed lab results with patient including her urinalysis.  Patient states that she does have a slight discomfort with voiding and mild low back pain.  Patient understands to return to the ER for fevers, worsening or concerning symptoms otherwise follow-up with her surgeon tomorrow.  Given prescription for Norco for pain as well as Phenergan for nausea as Zofran gives her headaches.   [LM]  2356 Plan: PO trial.  If pain remimproved, may be d/c home.   [HM]    Clinical Course User Index [HM] Juwuan Sedita, Jarrett Soho, PA-C [LM] Tacy Learn, PA-C    US Abdomen Limited  Result Date: 11/19/2019 CLINICAL DATA:  Right upper quadrant pain, history of gallstones EXAM: ULTRASOUND ABDOMEN LIMITED RIGHT UPPER QUADRANT COMPARISON:  02/01/2019 FINDINGS: Gallbladder: Gallbladder is partially  distended with multiple gallstones within. Largest of these measures 3 cm. No wall thickening or pericholecystic fluid is noted. Common bile duct: Diameter: 5 mm Liver: Increased echogenicity is noted consistent with fatty infiltration. Portal vein is patent on color Doppler imaging with normal direction of blood flow towards the liver. Other: None. IMPRESSION: Fatty infiltration of the liver. Gallstones without complicating factors. Electronically Signed   By: Inez Catalina M.D.   On: 11/19/2019 01:54    Procedures  MDM    1:14 AM Patient with known gallstones and  right upper quadrant abdominal pain with nausea after eating fatty meal.  Pain improved after Dilaudid however upon recheck has begun to return.  She remains tender to palpation in the right upper quadrant.  She continues to have nausea.  Discussed risk and benefit of ultrasound.  Patient initially declined and then changed her mind stating she would rather have that completed here.  She does have a follow-up with the surgeon tomorrow morning.  Additional pain medications given and ultrasound ordered.  2:06 AM Patient reports resolution of pain.  Ultrasound without evidence of cholecystitis or choledocholithiasis.  Patient has follow-up with her surgeon in the morning.  Encouraged present.  Discussed reasons to return to the emergency department.  Right upper quadrant abdominal pain  Urinary tract infection in female       Loni Muse Gwenlyn Perking 11/19/19 Jaci Lazier, MD 11/19/19 [phone]

## 2019-11-18 NOTE — ED Triage Notes (Signed)
Patient with abdominal pain and nausea, worse tonight, scheduled to have surgical consultation to remove gallbladder tomorrow.

## 2019-11-18 NOTE — Medical Student Note (Signed)
[facility] DEPT Provider Student Note For educational purposes for Medical, PA and NP students only and not part of the legal medical record.   CSN: [id number] Arrival date & time: 11/18/19  2110      History   Chief Complaint Chief Complaint  Patient presents with  . Abdominal Pain    HPI Lori Shea is a 44 y.o. female with history of choliliethiasis for the past "20 years" causing intermittent problems. 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 Has been worse in the past couple months, doesn't notice any food triggers. Notes nausea. Denies vomiting or diarrhea. Denies jaundice. Normal BM. Only relief when laying on left side. Has surgical consult scheduled tomorrow. This specific episode of pain started "earlier today" been on and off, started worsening around 10:30.    Pain started after eating a bacon, egg and cheese bisquit from a fast food restaurant. She also had a cheese quesadilla at 6:30. Denies food intolerances.  Uses albueterol as needed -- 7-10x daily due to COVID symptoms. Takes amitriptyline daily. Daily Singulair andTwice daily advair. Takes novolog, lantus, and metformin.    Surgical history-   HPI  Past Medical History:  Diagnosis Date  . Allergy    Foods:  mango, honey, corn.  Allergic to "everything tested"  15 years ago in [place]  . Asthma age 59 yo   Triggers:  cold weather, exercise, allergens, anxiety, URIs  . Cholelithiasis age 24 yo   Asymptomatic  . COVID-19   . Diabetes mellitus 2012   Was diagnosed 2 years earlier with PCOS  . Elevated liver enzymes 09/13/2016  . History of kidney stones   . Hypertension 2015  . Kidney stones    family history of renal failure  . Migraines 2009  . Morbid obesity ([place])   . PCOS (polycystic ovarian syndrome) 2010    Patient Active Problem List   Diagnosis Date Noted  . Hemoglobin A1C greater than 9%, indicating poor diabetic  control 03/23/2019  . Hyperglycemia 03/23/2019  . Class 3 severe obesity due to excess calories with serious comorbidity and body mass index (BMI) of 40.0 to 44.9 in adult ([place]) 03/23/2019  . Yeast infection 03/23/2019  . BMI 40.0-44.9, adult ([place]) 03/09/2019  . Cervical dysplasia 07/31/2017  . Alopecia of scalp 05/09/2017  . Pain in left toe(s) 12/27/2016  . Closed avulsion fracture of lateral malleolus of right fibula 12/27/2016  . Morbid obesity ([place]) 09/13/2016  . Elevated liver enzymes 09/13/2016  . Allergy   . Asthma   . Hypertension 04/23/2013  . Type 2 diabetes mellitus with hyperglycemia, with long-term current use of insulin ([place]) 04/23/2010  . PCOS (polycystic ovarian syndrome) 04/23/2008  . Migraines 04/24/2007    Past Surgical History:  Procedure Laterality Date  . COLPOSCOPY N/A   . LAPAROSCOPIC TUBAL LIGATION Bilateral 01/28/2019   Procedure: LAPAROSCOPIC TUBAL LIGATION AND PAP SMEAR;  Surgeon: Aletha Halim, MD;  Location: [facility];  Service: Gynecology;  Laterality: Bilateral;  . TONSILLECTOMY AND ADENOIDECTOMY Bilateral 12/23/2018   Procedure: TONSILLECTOMY AND ADENOIDECTOMY;  Surgeon: Leta Baptist, MD;  Location: [facility];  Service: ENT;  Laterality: Bilateral;    OB History    Gravida  4   Para  1   Term  1   Preterm      AB  3   Living  1     SAB  3  TAB      Ectopic      Multiple      Live Births               Home Medications    Prior to Admission medications   Medication Sig Start Date End Date Taking? Authorizing Provider  albuterol (PROVENTIL) (2.5 MG/3ML) 0.083% nebulizer solution Take 3 mLs (2.5 mg total) by nebulization every 6 (six) hours as needed for wheezing or shortness of breath. 09/18/19   Vevelyn Francois, NP  albuterol (VENTOLIN HFA) 108 (90 Base) MCG/ACT inhaler INHALE 2 PUFFS INTO THE LUNGS EVERY 4 HOURS AS NEEDED FOR WHEEZING ORSHORTNESS OF BREATH. 09/18/19   Vevelyn Francois, NP    amitriptyline (ELAVIL) 25 MG tablet TAKE 1 TABLET BY MOUTH NIGHTLY AT BEDTIME 11/09/19   Vevelyn Francois, NP  amitriptyline (ELAVIL) 25 MG tablet TAKE 1 TABLET BY MOUTH NIGHTLY AT BEDTIME 09/18/19   Vevelyn Francois, NP  cetirizine (ZYRTEC) 10 MG tablet Take 1 tablet (10 mg total) by mouth daily. 09/18/19   Vevelyn Francois, NP  cyclobenzaprine (FLEXERIL) 5 MG tablet Take 1 tablet (5 mg total) by mouth 3 (three) times daily as needed for muscle spasms. 11/02/19   Drenda Freeze, MD  Dexlansoprazole (DEXILANT) 30 MG capsule TAKE 1 CAPSULE (30MG  TOTAL) BY MOUTH DAILY. TAKE ON AN EMPTY STOMACH. 09/18/19   Vevelyn Francois, NP  EASY COMFORT PEN NEEDLES 32G X 4 MM MISC use as directed test 2 times daily 05/14/17   [provider]  EDARBYCLOR 40-25 MG TABS TAKE 1 TABLET BY MOUTH DAILY. 10/27/19 01/25/20  Vevelyn Francois, NP  Fluticasone-Salmeterol (ADVAIR DISKUS) 100-50 MCG/DOSE AEPB INHALE 1 PUFF INTO THE LUNGS EVERY 12 HOURS. 10/27/19   Vevelyn Francois, NP  ibuprofen (ADVIL) 800 MG tablet Take 1 tablet (800 mg total) by mouth 3 (three) times daily. 11/02/19   Drenda Freeze, MD  insulin aspart (NOVOLOG FLEXPEN) 100 UNIT/ML FlexPen Inject 10-14 Units into the skin 3 (three) times daily before meals. 06/30/19   Azzie Glatter, FNP  insulin glargine (LANTUS SOLOSTAR) 100 UNIT/ML Solostar Pen Inject 50 Units into the skin 2 (two) times daily. 10/01/19   Vevelyn Francois, NP  Insulin Pen Needle (PEN NEEDLES) 32G X 5 MM MISC 100 each by Does not apply route 2 (two) times daily. 05/14/17   Dorena Dew, FNP  MAGNESIUM CITRATE PO Take 1 capsule by mouth every morning.     [provider]  MAXALT-MLT 10 MG disintegrating tablet TAKE 1 TABLET (10MG  TOTAL) BY MOUTH AS NEEDED FOR MIGRAINE. MAY REPEAT IN 2 HOURS IF NEEDED. MAX 30MG /24 HOURS. 07/21/19   Azzie Glatter, FNP  meloxicam (MOBIC) 15 MG tablet Take 1 tablet (15 mg total) by mouth daily as needed for pain. 09/18/19 12/17/19  Vevelyn Francois, NP   metFORMIN (GLUCOPHAGE) 500 MG tablet Take 1 tablet (500 mg total) by mouth 2 (two) times daily with a meal. 04/13/19 10/10/19  Azzie Glatter, FNP  montelukast (SINGULAIR) 10 MG tablet Take 1 tablet (10 mg total) by mouth daily. 11/06/19 10/31/20  Vevelyn Francois, NP  Psyllium (FIBER) 0.52 g CAPS Take 1 capsule by mouth at bedtime.     [provider]  simvastatin (ZOCOR) 5 MG tablet Take 1 tablet (5 mg total) by mouth at bedtime. 11/06/19 02/04/20  Vevelyn Francois, NP    Family History Family History  Problem Relation Age of Onset  .  Diabetes Father   . Hyperlipidemia Father   . Hypertension Father   . Heart disease Father   . Kidney disease Father        Developed after 2nd CABG  . Diabetes Paternal Grandfather   . Heart disease Paternal Grandfather   . Hyperlipidemia Paternal Grandfather   . Hypertension Paternal Grandfather   . Kidney disease Paternal Grandfather     Social History Social History   Tobacco Use  . Smoking status: Never Smoker  . Smokeless tobacco: Never Used  Vaping Use  . Vaping Use: Never used  Substance Use Topics  . Alcohol use: No  . Drug use: No     Allergies   Corn-containing products, Honey, Mango flavor, Glipizide, and Victoza [liraglutide]   Review of Systems Review of Systems   Physical Exam Updated Vital Signs BP (!) 150/87 (BP Location: Left Arm)   Pulse 91   Temp 98 F (36.7 C) (Oral)   Resp 18   Ht 5\' 5"  (1.651 m)   Wt (!) 117.9 kg   SpO2 98%   BMI 43.27 kg/m   Physical Exam Normal bowel sounds. Tenderness to deep palpation in RUQ and LUQ. No masses palpable. No jaundice.   ED Treatments / Results  Labs (all labs ordered are listed, but only abnormal results are displayed) Labs Reviewed  COMPREHENSIVE METABOLIC PANEL - Abnormal; Notable for the following components:      Result Value   Potassium 3.4 (*)    Glucose, Bld 276 (*)    BUN 23 (*)    All other components within normal limits  LIPASE, BLOOD   CBC WITH DIFFERENTIAL/PLATELET  URINALYSIS, ROUTINE W REFLEX MICROSCOPIC  I-STAT BETA HCG BLOOD, ED ([facility], [facility], [facility] ONLY)    EKG  Radiology No results found.  Procedures Procedures (including critical care time)  Medications Ordered in ED Medications - No data to display   Initial Impression / Assessment and Plan / ED Course  I have reviewed the triage vital signs and the nursing notes.  Pertinent labs & imaging results that were available during my care of the patient were reviewed by me and considered in my medical decision making (see chart for details).  Final Clinical Impressions(s) / ED Diagnoses   Final diagnoses:  None    New Prescriptions New Prescriptions   No medications on file

## 2019-11-19 ENCOUNTER — Other Ambulatory Visit: Payer: Self-pay | Admitting: Family Medicine

## 2019-11-19 ENCOUNTER — Ambulatory Visit: Payer: Self-pay | Admitting: Surgery

## 2019-11-19 ENCOUNTER — Emergency Department (HOSPITAL_COMMUNITY): Payer: Medicaid Other

## 2019-11-19 DIAGNOSIS — Z794 Long term (current) use of insulin: Secondary | ICD-10-CM

## 2019-11-19 MED ORDER — HYDROMORPHONE HCL 1 MG/ML IJ SOLN
0.5000 mg | Freq: Once | INTRAMUSCULAR | Status: AC
  Administered 2019-11-19: 0.5 mg via INTRAVENOUS
  Filled 2019-11-19: qty 1

## 2019-11-24 NOTE — Progress Notes (Addendum)
COVID Vaccine Completed: No  Date COVID Vaccine completed: COVID vaccine manufacturer: [facility]   PCP - Dionisio David, NP Cardiologist - N/A  Chest x-ray - 11-02-19 in Epic EKG - 11-02-19 in Epic Stress Test -  N/A  ECHO -  N/A  Cardiac Cath -  N/A   Sleep Study -  N/A  CPAP -  N/A   Fasting Blood Sugar - 150-280 Checks Blood Sugar 3 days a week  Blood Thinner Instructions: N/A  Aspirin Instructions: N/A  Last Dose:  Anesthesia review:   Patient denies shortness of breath, fever, cough and chest pain at PAT appointment   Patient verbalized understanding of instructions that were given to them at the PAT appointment. Patient was also instructed that they will need to review over the PAT instructions again at home before surgery.

## 2019-11-24 NOTE — Patient Instructions (Addendum)
DUE TO COVID-19 ONLY ONE VISITOR ARE ALLOWED TO COME WITH YOU AND STAY IN THE WAITING ROOM ONLY DURING PRE OP AND PROCEDURE. THEN TWO VISITORS MAY VISIT WITH YOU IN YOUR PRIVATE ROOM DURING VISITING HOURS ONLY!! (10AM-8PM)   COVID SWAB TESTING MUST BE COMPLETED ON:  Friday, 11-27-19 @ 2:50 PM   [address] [place], [place] [address]  (Must self quarantine after testing. Follow instructions on handout.)        Your procedure is scheduled on:  Tuesday, 12-01-19   Report to [facility] Main  Entrance   Report to Short Stay at 5:30 AM   [facility])   Call this number if you have problems the morning of surgery [phone]   Do not eat solid food :After Midnight.   May have liquids until 4:30 AM   day of surgery  CLEAR LIQUID DIET  Foods Allowed                                                                     Foods Excluded  Water, Black Coffee and tea, regular and decaf             liquids that you cannot  Plain Jell-O in any flavor  (No red)                                    see through such as: Fruit ices (not with fruit pulp)                                      milk, soups, orange juice              Iced Popsicles (No red)                                      All solid food                                   Apple juices Sports drinks like Gatorade (No red) Lightly seasoned clear broth or consume(fat free) Sugar, honey syrup  Sample Menu Breakfast                                Lunch                                     Supper Cranberry juice                    Beef broth                            Chicken broth Jell-O  Grape juice                           Apple juice Coffee or tea                        Jell-O                                      Popsicle                                                  Coffee or tea                        Coffee or tea      Oral Hygiene is also important to reduce your risk of infection.                                     Remember - BRUSH YOUR TEETH THE MORNING OF SURGERY WITH YOUR REGULAR TOOTHPASTE   Do NOT smoke after Midnight   Take these medicines the morning of surgery with A SIP OF WATER:  Cetirizine (Zyrtec), Dexilant, Hydrocodone, Singulair  DO NOT TAKE ANY ORAL DIABETIC MEDICATIONS DAY OF YOUR SURGERY                               You may not have any metal on your body including hair pins, jewelry, and body piercings              Do not wear make-up, lotions, powders, perfumes/cologne, or deodorant              Do not wear nail polish.  Do not shave  48 hours prior to surgery.                Do not bring valuables to the hospital. [place].   Contacts, dentures or bridgework may not be worn into surgery.   Bring small overnight bag day of surgery.     Special Instructions: Bring a copy of your healthcare power of attorney and living will documents  the day of surgery if you haven't  scanned them in before.              Please read over the following fact sheets you were given: IF YOU HAVE QUESTIONS ABOUT YOUR PRE OP [place] [phone]  How to Manage Your Diabetes Before and After Surgery  Why is it important to control my blood sugar before and after surgery? . Improving blood sugar levels before and after surgery helps healing and can limit problems. . A way of improving blood sugar control is eating a healthy diet by: o  Eating less sugar and carbohydrates o  Increasing activity/exercise o  Talking with your doctor about reaching your blood sugar goals . High blood sugars (greater than 180 mg/dL) can raise your risk of infections and slow your recovery, so you will need to focus on controlling your diabetes during the weeks  before surgery. . Make sure that the doctor who takes care of your diabetes knows about your planned surgery including the date and location.  How do I manage my blood sugar  before surgery? . Check your blood sugar at least 4 times a day, starting 2 days before surgery, to make sure that the level is not too high or low. o Check your blood sugar the morning of your surgery when you wake up and every 2 hours until you get to the Short Stay unit. . If your blood sugar is less than 70 mg/dL, you will need to treat for low blood sugar: o Do not take insulin. o Treat a low blood sugar (less than 70 mg/dL) with  cup of clear juice (cranberry or apple), 4 glucose tablets, OR glucose gel. o Recheck blood sugar in 15 minutes after treatment (to make sure it is greater than 70 mg/dL). If your blood sugar is not greater than 70 mg/dL on recheck, call [phone] for further instructions. . Report your blood sugar to the short stay nurse when you get to Short Stay.  . If you are admitted to the hospital after surgery: o Your blood sugar will be checked by the staff and you will probably be given insulin after surgery (instead of oral diabetes medicines) to make sure you have good blood sugar levels. o The goal for blood sugar control after surgery is 80-180 mg/dL.   WHAT DO I DO ABOUT MY DIABETES MEDICATION?  Marland Kitchen Do not take oral diabetes medicines (pills) the morning of surgery.  . THE DAY BEFORE SURGERY:  Take usual dose of Metformin. -    Do not take bedtime dose of Novolog Flexpen (Insulin Aspart).    -    Lantus Solostar (Insulin Glargine), take 20 units at dinner/bedtime.  . THE MORNING OF SURGERY:  If your CBG is greater than 220 mg/dL, you may take  of your sliding scale  . (correction) dose of insulin.     Reviewed and Endorsed by [facility] Patient Education Committee, August 2015   [facility] - Preparing for Surgery Before surgery, you can play an important role.  Because skin is not sterile, your skin needs to be as free of germs as possible.  You can reduce the number of germs on your skin by washing with CHG (chlorahexidine gluconate) soap before  surgery.  CHG is an antiseptic cleaner which kills germs and bonds with the skin to continue killing germs even after washing. Please DO NOT use if you have an allergy to CHG or antibacterial soaps.  If your skin becomes reddened/irritated stop using the CHG and inform your nurse when you arrive at Short Stay. Do not shave (including legs and underarms) for at least 48 hours prior to the first CHG shower.  You may shave your face/neck.   Please follow these instructions carefully:  1.  Shower with CHG Soap the night before surgery and the  morning of surgery.  2.  If you choose to wash your hair, wash your hair first as usual with your normal  shampoo.  3.  After you shampoo, rinse your hair and body thoroughly to remove the shampoo.                             4.  Use CHG as you would any other liquid soap.  You can apply chg directly to the skin and wash.  Gently  with a scrungie or clean washcloth.  5.  Apply the CHG Soap to your body ONLY FROM THE NECK DOWN.   Do   not use on face/ open                           Wound or open sores. Avoid contact with eyes, ears mouth and   genitals (private parts).                       Wash face,  Genitals (private parts) with your normal soap.             6.  Wash thoroughly, paying special attention to the area where your    surgery  will be performed.  7.  Thoroughly rinse your body with warm water from the neck down.  8.  DO NOT shower/wash with your normal soap after using and rinsing off the CHG Soap.                9.  Pat yourself dry with a clean towel.            10.  Wear clean pajamas.            11.  Place clean sheets on your bed the night of your first shower and do not  sleep with pets. Day of Surgery : Do not apply any lotions/deodorants the morning of surgery.  Please wear clean clothes to the hospital/surgery center.  FAILURE TO FOLLOW THESE INSTRUCTIONS MAY RESULT IN THE CANCELLATION OF YOUR SURGERY  PATIENT  SIGNATURE_________________________________  NURSE SIGNATURE__________________________________  ________________________________________________________________________

## 2019-11-26 ENCOUNTER — Other Ambulatory Visit: Payer: Self-pay

## 2019-11-26 ENCOUNTER — Encounter (HOSPITAL_COMMUNITY)
Admission: RE | Admit: 2019-11-26 | Discharge: 2019-11-26 | Disposition: A | Discharge status: Home / Self Care | Payer: Medicaid Other | Source: Ambulatory Visit | Attending: Surgery | Admitting: Surgery

## 2019-11-26 ENCOUNTER — Encounter (HOSPITAL_COMMUNITY): Payer: Self-pay

## 2019-11-26 DIAGNOSIS — Z01812 Encounter for preprocedural laboratory examination: Secondary | ICD-10-CM | POA: Insufficient documentation

## 2019-11-26 HISTORY — DX: Gastro-esophageal reflux disease without esophagitis: K21.9

## 2019-11-26 HISTORY — DX: Unspecified osteoarthritis, unspecified site: M19.90

## 2019-11-26 HISTORY — DX: Cardiac murmur, unspecified: R01.1

## 2019-11-26 HISTORY — DX: Anemia, unspecified: D64.9

## 2019-11-26 HISTORY — DX: Pneumonia, unspecified organism: J18.9

## 2019-11-26 LAB — HEMOGLOBIN A1C
Hgb A1c MFr Bld: 9.7 % — ABNORMAL HIGH (ref 4.8–5.6)
Mean Plasma Glucose: 231.69 mg/dL

## 2019-11-26 LAB — BASIC METABOLIC PANEL
Anion gap: 11 (ref 5–15)
BUN: 20 mg/dL (ref 6–20)
CO2: 24 mmol/L (ref 22–32)
Calcium: 8.9 mg/dL (ref 8.9–10.3)
Chloride: 103 mmol/L (ref 98–111)
Creatinine, Ser: 0.62 mg/dL (ref 0.44–1.00)
GFR calc Af Amer: >60 mL/min (ref >60)
GFR calc non Af Amer: >60 mL/min (ref >60)
Glucose, Bld: 250 mg/dL — ABNORMAL HIGH (ref 70–99)
Potassium: 3.4 mmol/L — ABNORMAL LOW (ref 3.5–5.1)
Sodium: 138 mmol/L (ref 135–145)

## 2019-11-26 LAB — CBC
HCT: 37.5 % (ref 36.0–46.0)
Hemoglobin: 12.5 g/dL (ref 12.0–15.0)
MCH: 28.9 pg (ref 26.0–34.0)
MCHC: 33.3 g/dL (ref 30.0–36.0)
MCV: 86.6 fL (ref 80.0–100.0)
Platelets: 278 10*3/uL (ref 150–400)
RBC: 4.33 MIL/uL (ref 3.87–5.11)
RDW: 13.4 % (ref 11.5–15.5)
WBC: 8.3 10*3/uL (ref 4.0–10.5)
nRBC: 0 % (ref 0.0–0.2)

## 2019-11-26 LAB — GLUCOSE, CAPILLARY: Glucose-Capillary: 232 mg/dL — ABNORMAL HIGH (ref 70–99)

## 2019-11-26 NOTE — Progress Notes (Signed)
BMP and hgb A1c sent to Dr. Harlow Asa to review.

## 2019-11-27 ENCOUNTER — Other Ambulatory Visit (HOSPITAL_COMMUNITY)
Admission: RE | Admit: 2019-11-27 | Discharge: 2019-11-27 | Disposition: A | Discharge status: Home / Self Care | Payer: Medicaid Other | Source: Ambulatory Visit | Attending: Surgery | Admitting: Surgery

## 2019-11-27 DIAGNOSIS — Z20822 Contact with and (suspected) exposure to covid-19: Secondary | ICD-10-CM | POA: Diagnosis not present

## 2019-11-27 DIAGNOSIS — Z01812 Encounter for preprocedural laboratory examination: Secondary | ICD-10-CM | POA: Insufficient documentation

## 2019-11-27 LAB — SARS CORONAVIRUS 2 (TAT 6-24 HRS): SARS Coronavirus 2: NEGATIVE

## 2019-11-29 ENCOUNTER — Encounter (HOSPITAL_COMMUNITY): Payer: Self-pay | Admitting: Surgery

## 2019-11-29 DIAGNOSIS — K8010 Calculus of gallbladder with chronic cholecystitis without obstruction: Secondary | ICD-10-CM | POA: Diagnosis present

## 2019-11-29 NOTE — H&P (Signed)
General Surgery [facility]  Lori Shea DOB: 01/09/1976 Married / Language: English / Race: White Female   History of Present Illness  The patient is a 44 year old female who presents for evaluation of gall stones.  CHIEF COMPLAINT: chronic cholecystitis, cholelithiasis  The patient is referred by Dionisio David, NP, for surgical evaluation and management of symptomatic cholelithiasis and chronic cholecystitis. Patient was originally diagnosed with cholelithiasis at age 50. She has tried a variety of remedies and treatments over the years in order to avoid cholecystectomy. Patient has now become more symptomatic with intermittent episodes of epigastric and right upper quadrant abdominal pain radiating to the back. She experiences nausea. She denies any fevers or chills. She denies any history of jaundice or acholic stools. She has had no prior hepatobiliary or pancreatic disease. Multiple diagnostic studies have shown cholelithiasis. Patient has had 2 recent trips to the emergency room for evaluation and to rule out a cardiac etiology. Patient now presents for surgical assessment and consideration of cholecystectomy. Patient denies any family history of gallbladder disease. She works as a Systems analyst.   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 Amitriptyline HCl (25MG  Tablet, Oral) Active. ZyrTEC (10MG  Tablet, Oral) Active. Cyclobenzaprine HCl (5MG  Tablet, Oral) Active. Dexilant (30MG  Capsule DR, Oral) Active. Edarbyclor (40-25MG  Tablet, Oral)  Active. Fluticasone Propionate (50MCG/ACT Suspension, Nasal) Active. NovoLOG (100UNIT/ML Solution, Subcutaneous) Active. Lantus (100UNIT/ML Solution, Subcutaneous) Active. Meloxicam (15MG  Tablet, Oral) Active. metFORMIN HCl (500MG  Tablet, Oral) Active. Montelukast Sodium (10MG  Tablet, Oral) Active. Promethazine HCl (25MG  Tablet, Oral) Active. Simvastatin (5MG  Tablet, Oral) Active. Medications Reconciled  Pregnancy / Birth History  Age at menarche  59 years. Gravida  4 Irregular periods  Maternal age  63-25 Para  1  Other Problems  Asthma  Back Pain  Cholelithiasis  Diabetes Mellitus  Gastroesophageal Reflux Disease  Kidney Stone  Migraine Headache   Review of Systems General Present- Appetite Loss, Fatigue, Night Sweats and Weight Gain. Not Present- Chills, Fever and Weight Loss. Skin Not Present- Change in Wart/Mole, Dryness, Hives, Jaundice, New Lesions, Non-Healing Wounds, Rash and Ulcer. HEENT Present- Seasonal Allergies and Wears glasses/contact lenses. Not Present- Earache, Hearing Loss, Hoarseness, Nose Bleed, Oral Ulcers, Ringing in the Ears, Sinus Pain, Sore Throat, Visual Disturbances and Yellow Eyes. Breast Not Present- Breast Mass, Breast Pain, Nipple Discharge and Skin Changes. Cardiovascular Present- Chest Pain and Shortness of Breath. Not Present- Difficulty Breathing Lying Down, Leg Cramps, Palpitations, Rapid Heart Rate and Swelling of Extremities. Gastrointestinal Present- Abdominal Pain, Bloating, Excessive gas, Gets full quickly at meals and Nausea. Not Present- Bloody Stool, Change in Bowel Habits, Chronic diarrhea, Constipation, Difficulty Swallowing, Hemorrhoids, Indigestion, Rectal Pain and Vomiting. Female Genitourinary Not Present- Frequency, Nocturia, Painful Urination, Pelvic Pain and Urgency. Musculoskeletal Present- Back Pain. Not Present- Joint Pain, Joint Stiffness, Muscle Pain, Muscle Weakness and Swelling of  Extremities. Neurological Present- Headaches. Not Present- Decreased Memory, Fainting, Numbness, Seizures, Tingling, Tremor, Trouble walking and Weakness. Psychiatric Not Present- Anxiety, Bipolar, Change in Sleep Pattern, Depression, Fearful and Frequent crying. Endocrine Not Present- Cold Intolerance, Excessive Hunger, Hair Changes, Heat Intolerance, Hot flashes and New Diabetes. Hematology Not Present- Blood Thinners,  Easy Bruising, Excessive bleeding, Gland problems, HIV and Persistent Infections.  Vitals  Weight: 274 lb Height: 65in Body Surface Area: 2.26 m Body Mass Index: 45.6 kg/m  Temp.: 98.28F  Pulse: 96 (Regular)  BP: 132/82(Sitting, Left Arm, Standard)  Physical Exam   GENERAL APPEARANCE Development: normal Nutritional status: normal Gross deformities: none  SKIN Rash, lesions, ulcers: none Induration, erythema: none Nodules: none palpable  EYES Conjunctiva and lids: normal Pupils: equal and reactive Iris: normal bilaterally  EARS, NOSE, MOUTH, THROAT External ears: no lesion or deformity External nose: no lesion or deformity Hearing: grossly normal Due to Covid-19 pandemic, patient is wearing a mask.  NECK Symmetric: yes Trachea: midline Thyroid: no palpable nodules in the thyroid bed  CHEST Respiratory effort: normal Retraction or accessory muscle use: no Breath sounds: normal bilaterally Rales, rhonchi, wheeze: none  CARDIOVASCULAR Auscultation: regular rhythm, normal rate Murmurs: none Pulses: radial pulse 2+ palpable Lower extremity edema: none  ABDOMEN Distension: none Masses: none palpable Tenderness: none Hepatosplenomegaly: not present Hernia: not present  MUSCULOSKELETAL Station and gait: normal Digits and nails: no clubbing or cyanosis Muscle strength: grossly normal all extremities Range of motion: grossly normal all extremities Deformity: none  LYMPHATIC Cervical: none palpable Supraclavicular: none  palpable  PSYCHIATRIC Oriented to person, place, and time: yes Mood and affect: normal for situation Judgment and insight: appropriate for situation    Assessment & Plan  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  Patient has a long-standing history of biliary symptoms. She was diagnosed with cholelithiasis many years ago. She has now decided to proceed with cholecystectomy. We discussed laparoscopic cholecystectomy. We discussed the size and location of the surgical incisions. We discussed the possibility of conversion to open surgery. We discussed performing this with an overnight hospital stay. We discussed her recovery and return to normal activities. She understands and wishes to proceed with surgery in the near future.  The risks and benefits of the procedure have been discussed at length with the patient. The patient understands the proposed procedure, potential alternative treatments, and the course of recovery to be expected. All of the patient's questions have been answered at this time. The patient wishes to proceed with surgery.  Armandina Gemma, MD [facility] Office: [phone]

## 2019-11-30 MED ORDER — VANCOMYCIN HCL 1500 MG/300ML IV SOLN
1500.0000 mg | INTRAVENOUS | Status: AC
  Administered 2019-12-01: 1500 mg via INTRAVENOUS
  Filled 2019-11-30: qty 300

## 2019-12-01 ENCOUNTER — Ambulatory Visit (HOSPITAL_COMMUNITY): Payer: Medicaid Other | Admitting: Certified Registered Nurse Anesthetist

## 2019-12-01 ENCOUNTER — Encounter (HOSPITAL_COMMUNITY): Payer: Self-pay | Admitting: Surgery

## 2019-12-01 ENCOUNTER — Ambulatory Visit (HOSPITAL_COMMUNITY): Payer: Medicaid Other | Admitting: Physician Assistant

## 2019-12-01 ENCOUNTER — Encounter (HOSPITAL_COMMUNITY)
Admission: RE | Disposition: A | Discharge status: Home / Self Care | Payer: Self-pay | Source: Other Acute Inpatient Hospital | Attending: Surgery

## 2019-12-01 ENCOUNTER — Other Ambulatory Visit: Payer: Self-pay

## 2019-12-01 ENCOUNTER — Ambulatory Visit (HOSPITAL_COMMUNITY)
Admission: RE | Admit: 2019-12-01 | Discharge: 2019-12-02 | Disposition: A | Discharge status: Home / Self Care | Payer: Medicaid Other | Source: Other Acute Inpatient Hospital | Attending: Surgery | Admitting: Surgery

## 2019-12-01 DIAGNOSIS — K219 Gastro-esophageal reflux disease without esophagitis: Secondary | ICD-10-CM | POA: Diagnosis not present

## 2019-12-01 DIAGNOSIS — Z6841 Body Mass Index (BMI) 40.0 and over, adult: Secondary | ICD-10-CM | POA: Diagnosis not present

## 2019-12-01 DIAGNOSIS — Z794 Long term (current) use of insulin: Secondary | ICD-10-CM | POA: Insufficient documentation

## 2019-12-01 DIAGNOSIS — J45909 Unspecified asthma, uncomplicated: Secondary | ICD-10-CM | POA: Insufficient documentation

## 2019-12-01 DIAGNOSIS — Z791 Long term (current) use of non-steroidal anti-inflammatories (NSAID): Secondary | ICD-10-CM | POA: Insufficient documentation

## 2019-12-01 DIAGNOSIS — E119 Type 2 diabetes mellitus without complications: Secondary | ICD-10-CM | POA: Diagnosis not present

## 2019-12-01 DIAGNOSIS — Z79899 Other long term (current) drug therapy: Secondary | ICD-10-CM | POA: Diagnosis not present

## 2019-12-01 DIAGNOSIS — K760 Fatty (change of) liver, not elsewhere classified: Secondary | ICD-10-CM | POA: Diagnosis not present

## 2019-12-01 DIAGNOSIS — I10 Essential (primary) hypertension: Secondary | ICD-10-CM | POA: Diagnosis not present

## 2019-12-01 DIAGNOSIS — K8010 Calculus of gallbladder with chronic cholecystitis without obstruction: Secondary | ICD-10-CM | POA: Insufficient documentation

## 2019-12-01 HISTORY — PX: CHOLECYSTECTOMY: SHX55

## 2019-12-01 LAB — GLUCOSE, CAPILLARY
Glucose-Capillary: 264 mg/dL — ABNORMAL HIGH (ref 70–99)
Glucose-Capillary: 267 mg/dL — ABNORMAL HIGH (ref 70–99)
Glucose-Capillary: 265 mg/dL — ABNORMAL HIGH (ref 70–99)
Glucose-Capillary: 338 mg/dL — ABNORMAL HIGH (ref 70–99)
Glucose-Capillary: 268 mg/dL — ABNORMAL HIGH (ref 70–99)

## 2019-12-01 LAB — PREGNANCY, URINE: Preg Test, Ur: NEGATIVE

## 2019-12-01 SURGERY — LAPAROSCOPIC CHOLECYSTECTOMY WITH INTRAOPERATIVE CHOLANGIOGRAM
Anesthesia: General

## 2019-12-01 MED ORDER — CHLORTHALIDONE 25 MG PO TABS
25.0000 mg | ORAL_TABLET | Freq: Every day | ORAL | Status: DC
  Administered 2019-12-01: 18:00:00 25 mg via ORAL
  Filled 2019-12-01: qty 1

## 2019-12-01 MED ORDER — ORAL CARE MOUTH RINSE: 15.0000 mL | Freq: Once | OROMUCOSAL | Status: AC

## 2019-12-01 MED ORDER — PROPOFOL 10 MG/ML IV BOLUS
INTRAVENOUS | Status: DC | PRN
  Administered 2019-12-01: 200 mg via INTRAVENOUS

## 2019-12-01 MED ORDER — CHLORHEXIDINE GLUCONATE CLOTH 2 % EX PADS: 6.0000 | MEDICATED_PAD | Freq: Once | CUTANEOUS | Status: DC

## 2019-12-01 MED ORDER — LACTATED RINGERS IV SOLN: INTRAVENOUS | Status: DC

## 2019-12-01 MED ORDER — MONTELUKAST SODIUM 10 MG PO TABS
10.0000 mg | ORAL_TABLET | Freq: Every day | ORAL | Status: DC
  Administered 2019-12-01: 18:00:00 10 mg via ORAL
  Filled 2019-12-01: qty 1

## 2019-12-01 MED ORDER — IOHEXOL 300 MG/ML  SOLN: INTRAMUSCULAR | Status: DC | PRN

## 2019-12-01 MED ORDER — ACETAMINOPHEN 325 MG PO TABS
650.0000 mg | ORAL_TABLET | Freq: Four times a day (QID) | ORAL | Status: DC | PRN
  Administered 2019-12-01 – 2019-12-02 (×2): 650 mg via ORAL
  Filled 2019-12-01 (×3): qty 2

## 2019-12-01 MED ORDER — INSULIN GLARGINE 100 UNIT/ML ~~LOC~~ SOLN
20.0000 [IU] | Freq: Every day | SUBCUTANEOUS | Status: DC
  Administered 2019-12-01: 20 [IU] via SUBCUTANEOUS
  Filled 2019-12-01: qty 0.2

## 2019-12-01 MED ORDER — ROCURONIUM BROMIDE 10 MG/ML (PF) SYRINGE
PREFILLED_SYRINGE | INTRAVENOUS | Status: AC
  Filled 2019-12-01: qty 10

## 2019-12-01 MED ORDER — HYDROMORPHONE HCL 1 MG/ML IJ SOLN
1.0000 mg | INTRAMUSCULAR | Status: DC | PRN
  Filled 2019-12-01: qty 1

## 2019-12-01 MED ORDER — PROPOFOL 10 MG/ML IV BOLUS
INTRAVENOUS | Status: AC
  Filled 2019-12-01: qty 20

## 2019-12-01 MED ORDER — FENTANYL CITRATE (PF) 250 MCG/5ML IJ SOLN
INTRAMUSCULAR | Status: DC | PRN
  Administered 2019-12-01: 100 ug via INTRAVENOUS
  Administered 2019-12-01: 50 ug via INTRAVENOUS
  Administered 2019-12-01: 25 ug via INTRAVENOUS

## 2019-12-01 MED ORDER — LIDOCAINE 2% (20 MG/ML) 5 ML SYRINGE
INTRAMUSCULAR | Status: AC
  Filled 2019-12-01: qty 5

## 2019-12-01 MED ORDER — ACETAMINOPHEN 650 MG RE SUPP: 650.0000 mg | Freq: Four times a day (QID) | RECTAL | Status: DC | PRN

## 2019-12-01 MED ORDER — LIDOCAINE 2% (20 MG/ML) 5 ML SYRINGE
INTRAMUSCULAR | Status: DC | PRN
  Administered 2019-12-01: 100 mg via INTRAVENOUS

## 2019-12-01 MED ORDER — CHLORHEXIDINE GLUCONATE 0.12 % MT SOLN
15.0000 mL | Freq: Once | OROMUCOSAL | Status: AC
  Administered 2019-12-01: 15 mL via OROMUCOSAL

## 2019-12-01 MED ORDER — METFORMIN HCL 500 MG PO TABS
500.0000 mg | ORAL_TABLET | Freq: Two times a day (BID) | ORAL | Status: DC
  Administered 2019-12-01 – 2019-12-02 (×2): 500 mg via ORAL
  Filled 2019-12-01 (×2): qty 1

## 2019-12-01 MED ORDER — BUPIVACAINE-EPINEPHRINE (PF) 0.5% -1:200000 IJ SOLN
INTRAMUSCULAR | Status: AC
  Filled 2019-12-01: qty 30

## 2019-12-01 MED ORDER — TRAMADOL HCL 50 MG PO TABS: 50.0000 mg | ORAL_TABLET | Freq: Four times a day (QID) | ORAL | Status: DC | PRN

## 2019-12-01 MED ORDER — HYDROMORPHONE HCL 1 MG/ML IJ SOLN
0.2500 mg | INTRAMUSCULAR | Status: DC | PRN
  Administered 2019-12-01: 0.5 mg via INTRAVENOUS

## 2019-12-01 MED ORDER — ALBUTEROL SULFATE (2.5 MG/3ML) 0.083% IN NEBU
2.5000 mg | INHALATION_SOLUTION | Freq: Four times a day (QID) | RESPIRATORY_TRACT | Status: DC | PRN
  Administered 2019-12-01 (×2): 2.5 mg via RESPIRATORY_TRACT
  Filled 2019-12-01 (×2): qty 3

## 2019-12-01 MED ORDER — INSULIN ASPART 100 UNIT/ML ~~LOC~~ SOLN
0.0000 [IU] | Freq: Three times a day (TID) | SUBCUTANEOUS | Status: DC
  Administered 2019-12-01: 11 [IU] via SUBCUTANEOUS
  Administered 2019-12-01: 15 [IU] via SUBCUTANEOUS
  Administered 2019-12-02: 11 [IU] via SUBCUTANEOUS

## 2019-12-01 MED ORDER — SODIUM CHLORIDE 0.45 % IV SOLN: INTRAVENOUS | Status: DC

## 2019-12-01 MED ORDER — SUGAMMADEX SODIUM 500 MG/5ML IV SOLN
INTRAVENOUS | Status: DC | PRN
  Administered 2019-12-01: 250 mg via INTRAVENOUS

## 2019-12-01 MED ORDER — HYDROMORPHONE HCL 1 MG/ML IJ SOLN
INTRAMUSCULAR | Status: AC
  Administered 2019-12-01: 0.5 mg via INTRAVENOUS
  Filled 2019-12-01: qty 1

## 2019-12-01 MED ORDER — ONDANSETRON HCL 4 MG/2ML IJ SOLN
INTRAMUSCULAR | Status: AC
  Filled 2019-12-01: qty 2

## 2019-12-01 MED ORDER — MIDAZOLAM HCL 2 MG/2ML IJ SOLN
INTRAMUSCULAR | Status: AC
  Filled 2019-12-01: qty 2

## 2019-12-01 MED ORDER — FENTANYL CITRATE (PF) 250 MCG/5ML IJ SOLN
INTRAMUSCULAR | Status: AC
  Filled 2019-12-01: qty 5

## 2019-12-01 MED ORDER — DROPERIDOL 2.5 MG/ML IJ SOLN
INTRAMUSCULAR | Status: DC | PRN
  Administered 2019-12-01: .625 mg via INTRAVENOUS

## 2019-12-01 MED ORDER — OXYCODONE HCL 5 MG PO TABS
5.0000 mg | ORAL_TABLET | ORAL | Status: DC | PRN
  Administered 2019-12-01 – 2019-12-02 (×3): 10 mg via ORAL
  Filled 2019-12-01 (×3): qty 2

## 2019-12-01 MED ORDER — PHENYLEPHRINE 40 MCG/ML (10ML) SYRINGE FOR IV PUSH (FOR BLOOD PRESSURE SUPPORT)
PREFILLED_SYRINGE | INTRAVENOUS | Status: DC | PRN
  Administered 2019-12-01 (×3): 80 ug via INTRAVENOUS

## 2019-12-01 MED ORDER — ROCURONIUM BROMIDE 10 MG/ML (PF) SYRINGE
PREFILLED_SYRINGE | INTRAVENOUS | Status: DC | PRN
  Administered 2019-12-01: 20 mg via INTRAVENOUS
  Administered 2019-12-01: 70 mg via INTRAVENOUS

## 2019-12-01 MED ORDER — DEXAMETHASONE SODIUM PHOSPHATE 10 MG/ML IJ SOLN
INTRAMUSCULAR | Status: DC | PRN
  Administered 2019-12-01: 4 mg via INTRAVENOUS

## 2019-12-01 MED ORDER — INSULIN ASPART 100 UNIT/ML ~~LOC~~ SOLN
4.0000 [IU] | Freq: Three times a day (TID) | SUBCUTANEOUS | Status: DC
  Administered 2019-12-01 – 2019-12-02 (×3): 4 [IU] via SUBCUTANEOUS

## 2019-12-01 MED ORDER — IRBESARTAN 150 MG PO TABS
300.0000 mg | ORAL_TABLET | Freq: Every day | ORAL | Status: DC
  Administered 2019-12-01: 300 mg via ORAL
  Filled 2019-12-01 (×2): qty 2

## 2019-12-01 MED ORDER — AMITRIPTYLINE HCL 25 MG PO TABS
25.0000 mg | ORAL_TABLET | Freq: Every day | ORAL | Status: DC
  Administered 2019-12-01: 22:00:00 25 mg via ORAL
  Filled 2019-12-01: qty 1

## 2019-12-01 MED ORDER — AZILSARTAN-CHLORTHALIDONE 40-25 MG PO TABS: 1.0000 | ORAL_TABLET | Freq: Every day | ORAL | Status: DC

## 2019-12-01 MED ORDER — MOMETASONE FURO-FORMOTEROL FUM 100-5 MCG/ACT IN AERO
2.0000 | INHALATION_SPRAY | Freq: Two times a day (BID) | RESPIRATORY_TRACT | Status: DC
  Administered 2019-12-02: 2 via RESPIRATORY_TRACT
  Filled 2019-12-01 (×2): qty 8.8

## 2019-12-01 MED ORDER — KETOROLAC TROMETHAMINE 30 MG/ML IJ SOLN: 30.0000 mg | Freq: Once | INTRAMUSCULAR | Status: DC | PRN

## 2019-12-01 MED ORDER — PROMETHAZINE HCL 25 MG/ML IJ SOLN: 6.2500 mg | INTRAMUSCULAR | Status: DC | PRN

## 2019-12-01 MED ORDER — DEXAMETHASONE SODIUM PHOSPHATE 10 MG/ML IJ SOLN
INTRAMUSCULAR | Status: AC
  Filled 2019-12-01: qty 1

## 2019-12-01 MED ORDER — PANTOPRAZOLE SODIUM 40 MG PO TBEC
40.0000 mg | DELAYED_RELEASE_TABLET | Freq: Every day | ORAL | Status: DC
  Administered 2019-12-01: 18:00:00 40 mg via ORAL
  Filled 2019-12-01: qty 1

## 2019-12-01 MED ORDER — SCOPOLAMINE 1 MG/3DAYS TD PT72
MEDICATED_PATCH | TRANSDERMAL | Status: DC | PRN
  Administered 2019-12-01: 1 via TRANSDERMAL

## 2019-12-01 MED ORDER — MIDAZOLAM HCL 5 MG/5ML IJ SOLN
INTRAMUSCULAR | Status: DC | PRN
  Administered 2019-12-01: 2 mg via INTRAVENOUS

## 2019-12-01 MED ORDER — BUPIVACAINE-EPINEPHRINE 0.5% -1:200000 IJ SOLN
INTRAMUSCULAR | Status: DC | PRN
  Administered 2019-12-01: 30 mL

## 2019-12-01 MED ORDER — SCOPOLAMINE 1 MG/3DAYS TD PT72
MEDICATED_PATCH | TRANSDERMAL | Status: AC
  Filled 2019-12-01: qty 1

## 2019-12-01 SURGICAL SUPPLY — 40 items
ADH SKN CLS APL DERMABOND .7 (GAUZE/BANDAGES/DRESSINGS) ×1
APL PRP STRL LF DISP 70% ISPRP (MISCELLANEOUS) ×2
APPLIER CLIP ROT 10 11.4 M/L (STAPLE) ×2
APR CLP MED LRG 11.4X10 (STAPLE) ×1
BAG SPEC RTRVL LRG 6X4 10 (ENDOMECHANICALS) ×1
CABLE HIGH FREQUENCY MONO STRZ (ELECTRODE) ×2 IMPLANT
CHLORAPREP W/TINT 26 (MISCELLANEOUS) ×4 IMPLANT
CLIP APPLIE ROT 10 11.4 M/L (STAPLE) ×1 IMPLANT
COVER MAYO STAND STRL (DRAPES) ×2 IMPLANT
COVER SURGICAL LIGHT HANDLE (MISCELLANEOUS) ×2 IMPLANT
COVER WAND RF STERILE (DRAPES) IMPLANT
DECANTER SPIKE VIAL GLASS SM (MISCELLANEOUS) ×2 IMPLANT
DERMABOND ADVANCED (GAUZE/BANDAGES/DRESSINGS) ×1
DERMABOND ADVANCED .7 DNX12 (GAUZE/BANDAGES/DRESSINGS) IMPLANT
DRAPE C-ARM 42X120 X-RAY (DRAPES) ×2 IMPLANT
ELECT REM PT RETURN 15FT ADLT (MISCELLANEOUS) ×2 IMPLANT
GAUZE SPONGE 2X2 8PLY STRL LF (GAUZE/BANDAGES/DRESSINGS) ×1 IMPLANT
GLOVE SURG ORTHO 8.0 STRL STRW (GLOVE) ×2 IMPLANT
GOWN STRL REUS W/TWL XL LVL3 (GOWN DISPOSABLE) ×5 IMPLANT
GRASPER SUT TROCAR 14GX15 (MISCELLANEOUS) ×1 IMPLANT
HEMOSTAT SURGICEL 4X8 (HEMOSTASIS) IMPLANT
KIT BASIN OR (CUSTOM PROCEDURE TRAY) ×2 IMPLANT
KIT TURNOVER KIT A (KITS) ×1 IMPLANT
PENCIL SMOKE EVACUATOR (MISCELLANEOUS) IMPLANT
POUCH SPECIMEN RETRIEVAL 10MM (ENDOMECHANICALS) ×2 IMPLANT
SCISSORS LAP 5X35 DISP (ENDOMECHANICALS) ×2 IMPLANT
SET CHOLANGIOGRAPH MIX (MISCELLANEOUS) ×2 IMPLANT
SET IRRIG TUBING LAPAROSCOPIC (IRRIGATION / IRRIGATOR) ×2 IMPLANT
SET TUBE SMOKE EVAC HIGH FLOW (TUBING) ×1 IMPLANT
SLEEVE XCEL OPT CAN 5 100 (ENDOMECHANICALS) ×2 IMPLANT
SPONGE GAUZE 2X2 STER 10/PKG (GAUZE/BANDAGES/DRESSINGS) ×1
STRIP CLOSURE SKIN 1/2X4 (GAUZE/BANDAGES/DRESSINGS) IMPLANT
SUT MNCRL AB 4-0 PS2 18 (SUTURE) ×2 IMPLANT
SUT VICRYL 0 27 CT2 27 ABS (SUTURE) ×1 IMPLANT
TOWEL OR 17X26 10 PK STRL BLUE (TOWEL DISPOSABLE) ×2 IMPLANT
TOWEL OR NON WOVEN STRL DISP B (DISPOSABLE) ×2 IMPLANT
TRAY LAPAROSCOPIC (CUSTOM PROCEDURE TRAY) ×2 IMPLANT
TROCAR BLADELESS OPT 5 100 (ENDOMECHANICALS) ×2 IMPLANT
TROCAR XCEL BLUNT TIP 100MML (ENDOMECHANICALS) ×2 IMPLANT
TROCAR XCEL NON-BLD 11X100MML (ENDOMECHANICALS) ×2 IMPLANT

## 2019-12-01 NOTE — Anesthesia Procedure Notes (Signed)
Procedure Name: Intubation Date/Time: 12/01/2019 7:27 AM Performed by: West Pugh, CRNA Pre-anesthesia Checklist: Patient identified, Emergency Drugs available, Suction available, Patient being monitored and Timeout performed Patient Re-evaluated:Patient Re-evaluated prior to induction Oxygen Delivery Method: Circle system utilized Preoxygenation: Pre-oxygenation with 100% oxygen Induction Type: IV induction Ventilation: Mask ventilation without difficulty Laryngoscope Size: Mac and 3 Grade View: Grade I Tube type: Oral Tube size: 7.0 mm Number of attempts: 1 Airway Equipment and Method: Stylet Placement Confirmation: ETT inserted through vocal cords under direct vision,  positive ETCO2 and breath sounds checked- equal and bilateral Secured at: 21 cm Tube secured with: Tape Dental Injury: Teeth and Oropharynx as per pre-operative assessment

## 2019-12-01 NOTE — Interval H&P Note (Signed)
History and Physical Interval Note:  12/01/2019 7:00 AM  Lori Shea  has presented today for surgery, with the diagnosis of chronic cholecystitis.  The various methods of treatment have been discussed with the patient and family. After consideration of risks, benefits and other options for treatment, the patient has consented to    Procedure(s): LAPAROSCOPIC CHOLECYSTECTOMY WITH INTRAOPERATIVE CHOLANGIOGRAM (N/A) as a surgical intervention.    The patient's history has been reviewed, patient examined, no change in status, stable for surgery.  I have reviewed the patient's chart and labs.  Questions were answered to the patient's satisfaction.    Armandina Gemma, MD [facility] Office: [place]

## 2019-12-01 NOTE — Op Note (Addendum)
Procedure Note  Pre-operative Diagnosis:  Chronic cholecystitis, cholelithiasis  Post-operative Diagnosis:  Same; hepatic steatosis; early changes of cirrhosis  Surgeon:  Armandina Gemma, MD  Assistant:  none   Procedure:  Laparoscopic cholecystectomy  Anesthesia:  General  Estimated Blood Loss:  minimal  Drains: none         Specimen: gallbladder to pathology  Indications:  The patient is referred by Dionisio David, NP, for surgical evaluation and management of symptomatic cholelithiasis and chronic cholecystitis. Patient was originally diagnosed with cholelithiasis at age 44. She has tried a variety of remedies and treatments over the years in order to avoid cholecystectomy. Patient has now become more symptomatic with intermittent episodes of epigastric and right upper quadrant abdominal pain radiating to the back. She experiences nausea. She denies any fevers or chills. She denies any history of jaundice or acholic stools. She has had no prior hepatobiliary or pancreatic disease. Multiple diagnostic studies have shown cholelithiasis. Patient has had 2 recent trips to the emergency room for evaluation and to rule out a cardiac etiology. Patient now presents for surgical assessment and consideration of cholecystectomy.   Procedure Details:  The patient was seen in the pre-op holding area. The risks, benefits, complications, treatment options, and expected outcomes were previously discussed with the patient. The patient agreed with the proposed plan and has signed the informed consent form.  The patient was transported to operating room #1 at the [facility]. The patient was placed in the supine position on the operating room table. Following induction of general anesthesia, the abdomen was prepped and draped in the usual aseptic fashion.  An incision was made in the skin near the umbilicus. The midline fascia was incised and the peritoneal cavity was entered and a Hasson  cannula was introduced under direct vision. The cannula was secured with a 0-Vicryl pursestring suture. Pneumoperitoneum was established with carbon dioxide. Additional cannulae were introduced under direct vision along the right costal margin in the midline, mid-clavicular line, and anterior axillary line.   The gallbladder was identified and the fundus grasped and retracted cephalad. The liver demonstrated changes of early cirrhosis likely related to hepatic steatosis. The liver was enlarged and moderately firm, limiting mobility. Adhesions were taken down bluntly and the electrocautery was utilized as needed, taking care not to involve any adjacent structures. The infundibulum was grasped and retracted laterally, exposing the peritoneum overlying the triangle of Calot. The peritoneum was incised and structures exposed with blunt dissection. The cystic duct was clearly identified, bluntly dissected circumferentially, and clipped at the neck of the gallbladder.  The cystic duct was then ligated with two ligaclips and divided. The cystic artery was identified, dissected circumferentially, ligated with ligaclips, and divided.  The gallbladder was dissected away from the gallbladder bed using the electrocautery for hemostasis. The gallbladder was completely removed from the liver and placed into an endocatch bag. The gallbladder was removed in the endocatch bag through the umbilical port site and submitted to pathology for review.  The right upper quadrant was irrigated and the gallbladder bed was inspected. Hemostasis was achieved with the electrocautery.  The infraumbilical port site was closed under direct vision with an additional 0-Vicryl suture using the PMI device.  Cannulae were removed under direct vision and good hemostasis was noted. Pneumoperitoneum was released and the majority of the carbon dioxide evacuated. The umbilical wound was irrigated and the fascia was then closed with the pursestring  suture.  Local anesthetic was infiltrated at all port sites.  Skin incisions were closed with 4-0 Monocril subcuticular sutures and Dermabond was applied.  Instrument, sponge, and needle counts were correct at the conclusion of the case.  The patient was awakened from anesthesia and brought to the recovery room in stable condition.  The patient tolerated the procedure well.   Armandina Gemma, MD [facility] Office: [phone]

## 2019-12-01 NOTE — Transfer of Care (Signed)
Immediate Anesthesia Transfer of Care Note  Patient: Lori Shea  Procedure(s) Performed: LAPAROSCOPIC CHOLECYSTECTOMY (N/A )  Patient Location: PACU  Anesthesia Type:General  Level of Consciousness: awake, drowsy and patient cooperative  Airway & Oxygen Therapy: Patient Spontanous Breathing and Patient connected to face mask oxygen  Post-op Assessment: Report given to RN and Post -op Vital signs reviewed and stable  Post vital signs: Reviewed and stable  Last Vitals:  Vitals Value Taken Time  BP 125/75 12/01/19 0847  Temp    Pulse 94 12/01/19 0849  Resp 16 12/01/19 0849  SpO2 99 % 12/01/19 0849  Vitals shown include unvalidated device data.  Last Pain:  Vitals:   12/01/19 0629  TempSrc: Oral  PainSc:       Patients Stated Pain Goal: 2 (32/12/24 8250)  Complications: No complications documented.

## 2019-12-01 NOTE — Anesthesia Preprocedure Evaluation (Signed)
Anesthesia Evaluation  Patient identified by MRN, date of birth, ID band Patient awake    Reviewed: Allergy & Precautions, NPO status , Patient's Chart, lab work & pertinent test results  Airway Mallampati: II  TM Distance: >3 FB Neck ROM: Full    Dental no notable dental hx.    Pulmonary neg pulmonary ROS,    Pulmonary exam normal breath sounds clear to auscultation       Cardiovascular hypertension, Normal cardiovascular exam Rhythm:Regular Rate:Normal     Neuro/Psych negative neurological ROS  negative psych ROS   GI/Hepatic negative GI ROS, Neg liver ROS,   Endo/Other  diabetes, Insulin DependentMorbid obesity  Renal/GU negative Renal ROS  negative genitourinary   Musculoskeletal negative musculoskeletal ROS (+)   Abdominal   Peds negative pediatric ROS (+)  Hematology negative hematology ROS (+)   Anesthesia Other Findings   Reproductive/Obstetrics negative OB ROS                             Anesthesia Physical Anesthesia Plan  ASA: III  Anesthesia Plan: General   Post-op Pain Management:    Induction: Intravenous  PONV Risk Score and Plan: 3 and Ondansetron, Dexamethasone, Treatment may vary due to age or medical condition and Midazolam  Airway Management Planned: Oral ETT  Additional Equipment:   Intra-op Plan:   Post-operative Plan: Extubation in OR  Informed Consent: I have reviewed the patients History and Physical, chart, labs and discussed the procedure including the risks, benefits and alternatives for the proposed anesthesia with the patient or authorized representative who has indicated his/her understanding and acceptance.     Dental advisory given  Plan Discussed with: CRNA and Surgeon  Anesthesia Plan Comments:         Anesthesia Quick Evaluation

## 2019-12-01 NOTE — Progress Notes (Signed)
Patient asked RN to remove corn from her allergy list.

## 2019-12-02 ENCOUNTER — Encounter (HOSPITAL_COMMUNITY): Payer: Self-pay | Admitting: Surgery

## 2019-12-02 DIAGNOSIS — K8010 Calculus of gallbladder with chronic cholecystitis without obstruction: Secondary | ICD-10-CM | POA: Diagnosis not present

## 2019-12-02 LAB — SURGICAL PATHOLOGY

## 2019-12-02 LAB — GLUCOSE, CAPILLARY: Glucose-Capillary: 259 mg/dL — ABNORMAL HIGH (ref 70–99)

## 2019-12-02 MED ORDER — OXYCODONE HCL 5 MG PO TABS: 5.0000 mg | ORAL_TABLET | ORAL | 0 refills | Status: DC | PRN

## 2019-12-02 NOTE — Discharge Summary (Signed)
Physician Discharge Summary [facility]  Patient ID: Lori Shea MRN: [id number] DOB/AGE: Oct 09, 1975 44 y.o.  Admit date: 12/01/2019 Discharge date: 12/02/2019  Admission Diagnoses:  Chronic cholecystitis, cholelithiasis  Discharge Diagnoses:  Principal Problem:   Cholelithiasis with chronic cholecystitis Active Problems:   Hepatic steatosis   Discharged Condition: good  Hospital Course: Patient was admitted for observation following gallbladder surgery.  Post op course was uncomplicated.  Pain was well controlled.  Tolerated diet.  Patient was prepared for discharge home on POD#1.  Consults: None  Treatments: surgery: lap chole  Discharge Exam: Blood pressure 121/82, pulse 81, temperature 98.2 F (36.8 C), temperature source Oral, resp. rate 17, height 5\' 5"  (1.651 m), weight 122.9 kg, SpO2 98 %. HEENT - clear Neck - soft Chest - clear bilaterally Cor - RRR Abd - soft, wounds dry and intact  Disposition: Home  Discharge Instructions    Diet - low sodium heart healthy   Complete by: As directed    Discharge instructions   Complete by: As directed    [facility]  LAPAROSCOPIC SURGERY:  POST-OP INSTRUCTIONS  Always review your discharge instruction sheet given to you by the facility where your surgery was performed.  A prescription for pain medication may be given to you upon discharge.  Take your pain medication as prescribed.  If narcotic pain medicine is not needed, then you may take acetaminophen (Tylenol) or ibuprofen (Advil) as needed.  Take your usually prescribed medications unless otherwise directed.  If you need a refill on your pain medication, please contact your pharmacy.  They will contact our office to request authorization. Prescriptions will not be filled after 5 P.M. or on weekends.  You should follow a light diet the first few days after arrival home, such as soup and crackers or toast.  Be sure to  include plenty of fluids daily.  Most patients will experience some swelling and bruising in the area of the incisions.  Ice packs will help.  Swelling and bruising can take several days to resolve.   It is common to experience some constipation after surgery.  Increasing fluid intake and taking a stool softener (such as Colace) will usually help or prevent this problem from occurring.  A mild laxative (Milk of Magnesia or Miralax) should be taken according to package instructions if there has been no bowel movement after 48 hours.  You will likely have Dermabond (topical glue) over your incisions.  This seals the incisions and allows you to bathe and shower at any time after your surgery.  Glue should remain in place for up to 10 days.  It may be removed after 10 days by pealing off the Dermabond material or using Vaseline or naval jelly to remove.  If you have steri-strips over your incisions, you may remove the gauze bandage on the second day after surgery, and you may shower at that time.  Leave your steri-strips (small skin tapes) in place directly over the incision.  These strips should remain on the skin for 5-7 days and then be removed.  You may get them wet in the shower and pat them dry.  Any sutures or staples will be removed at the office during your follow-up visit.  ACTIVITIES:  You may resume regular (light) daily activities beginning the next day - such as daily self-care, walking, climbing stairs - gradually increasing activities as tolerated.  You may have sexual intercourse when it is comfortable.  Refrain  from any heavy lifting or straining until approved by your doctor.  You may drive when you are no longer taking prescription pain medication, when you can comfortably wear a seatbelt, and when you can safely maneuver your car and apply brakes.  You should see your doctor in the office for a follow-up appointment approximately 2-3 weeks after your surgery.  Make sure that you call  for this appointment within a day or two after you arrive home to insure a convenient appointment time.  WHEN TO CALL YOUR DOCTOR: Fever over 101.0 Inability to urinate Continued bleeding from incision Increased pain, redness, or drainage from the incision Increasing abdominal pain  The clinic staff is available to answer your questions during regular business hours.  Please don't hesitate to call and ask to speak to one of the nurses for clinical concerns.  If you have a medical emergency, go to the nearest emergency room or call 911.  A surgeon from [facility] is always on call for the hospital.  Earnstine Regal, MD, [facility] Office: [place] Free:  [place] [phone]  Website: [email or web address]   Increase activity slowly   Complete by: As directed    No dressing needed   Complete by: As directed      Allergies as of 12/02/2019      Reactions   Honey Shortness Of Breath   Mango Flavor Hives, Swelling   Pt allergic to all mango - tongue and throat swelling   Glipizide    Victoza [liraglutide] Nausea And Vomiting      Medication List    TAKE these medications   albuterol 108 (90 Base) MCG/ACT inhaler Commonly known as: Ventolin HFA INHALE 2 PUFFS INTO THE LUNGS EVERY 4 HOURS AS NEEDED FOR WHEEZING ORSHORTNESS OF BREATH. What changed:   how much to take  how to take this  when to take this  reasons to take this  additional instructions   albuterol (2.5 MG/3ML) 0.083% nebulizer solution Commonly known as: PROVENTIL Take 3 mLs (2.5 mg total) by nebulization every 6 (six) hours as needed for wheezing or shortness of breath. What changed: Another medication with the same name was changed. Make sure you understand how and when to take each.   amitriptyline 25 MG tablet Commonly known as: ELAVIL TAKE 1 TABLET BY MOUTH NIGHTLY AT BEDTIME What changed: Another medication with the same name  was changed. Make sure you understand how and when to take each.   amitriptyline 25 MG tablet Commonly known as: ELAVIL TAKE 1 TABLET BY MOUTH NIGHTLY AT BEDTIME What changed: See the new instructions.   cetirizine 10 MG tablet Commonly known as: ZYRTEC Take 1 tablet (10 mg total) by mouth daily.   cyclobenzaprine 5 MG tablet Commonly known as: FLEXERIL Take 1 tablet (5 mg total) by mouth 3 (three) times daily as needed for muscle spasms.   Dexilant 30 MG capsule Generic drug: Dexlansoprazole TAKE 1 CAPSULE (30MG  TOTAL) BY MOUTH DAILY. TAKE ON AN EMPTY STOMACH. What changed:   how much to take  how to take this  when to take this  additional instructions   Edarbyclor 40-25 MG Tabs Generic drug: Azilsartan-Chlorthalidone TAKE 1 TABLET BY MOUTH DAILY.   Fiber 0.52 g Caps Take 1 capsule by mouth at bedtime.   Fluticasone-Salmeterol 100-50 MCG/DOSE Aepb Commonly known as: Advair Diskus INHALE 1 PUFF INTO THE LUNGS EVERY 12 HOURS.   HYDROcodone-acetaminophen 5-325 MG tablet Commonly known as: NORCO/VICODIN  Take 2 tablets by mouth every 4 (four) hours as needed.   ibuprofen 800 MG tablet Commonly known as: ADVIL Take 1 tablet (800 mg total) by mouth 3 (three) times daily.   Lantus SoloStar 100 UNIT/ML Solostar Pen Generic drug: insulin glargine Inject 50 Units into the skin 2 (two) times daily. What changed: how much to take   MAGNESIUM CITRATE PO Take 1 capsule by mouth every morning.   Maxalt-MLT 10 MG disintegrating tablet Generic drug: rizatriptan TAKE 1 TABLET (10MG  TOTAL) BY MOUTH AS NEEDED FOR MIGRAINE. MAY REPEAT IN 2 HOURS IF NEEDED. MAX 30MG /24 HOURS. What changed: See the new instructions.   meloxicam 15 MG tablet Commonly known as: MOBIC Take 1 tablet (15 mg total) by mouth daily as needed for pain.   metFORMIN 500 MG tablet Commonly known as: GLUCOPHAGE TAKE 1 TABLET BY MOUTH 2 TIMES DAILY WITH A MEAL   montelukast 10 MG tablet Commonly  known as: SINGULAIR Take 1 tablet (10 mg total) by mouth daily.   NovoLOG FlexPen 100 UNIT/ML FlexPen Generic drug: insulin aspart Inject 10-14 Units into the skin 3 (three) times daily before meals.   oxyCODONE 5 MG immediate release tablet Commonly known as: Oxy IR/ROXICODONE Take 1-2 tablets (5-10 mg total) by mouth every 4 (four) hours as needed for moderate pain.   Pen Needles 32G X 5 MM Misc 100 each by Does not apply route 2 (two) times daily.   Easy Comfort Pen Needles 32G X 4 MM Misc Generic drug: Insulin Pen Needle use as directed test 2 times daily   promethazine 25 MG tablet Commonly known as: PHENERGAN Take 1 tablet (25 mg total) by mouth every 6 (six) hours as needed for nausea or vomiting.   simvastatin 5 MG tablet Commonly known as: Zocor Take 1 tablet (5 mg total) by mouth at bedtime.            Discharge Care Instructions  (From admission, onward)         Start     Ordered   12/02/19 0000  No dressing needed        12/02/19 6004          Follow-up Information    Armandina Gemma, MD. Schedule an appointment as soon as possible for a visit in 3 week(s).   Specialty: General Surgery Contact information: [address] [place] [address] [phone]               Earnstine Regal, MD, [facility] Office: [phone]   Signed: Armandina Gemma 12/02/2019, 8:26 AM

## 2019-12-02 NOTE — Discharge Instructions (Signed)
[facility]  LAPAROSCOPIC SURGERY:  POST-OP INSTRUCTIONS  Always review your discharge instruction sheet given to you by the facility where your surgery was performed.  A prescription for pain medication may be given to you upon discharge.  Take your pain medication as prescribed.  If narcotic pain medicine is not needed, then you may take acetaminophen (Tylenol) or ibuprofen (Advil) as needed.  Take your usually prescribed medications unless otherwise directed.  If you need a refill on your pain medication, please contact your pharmacy.  They will contact our office to request authorization. Prescriptions will not be filled after 5 P.M. or on weekends.  You should follow a light diet the first few days after arrival home, such as soup and crackers or toast.  Be sure to include plenty of fluids daily.  Most patients will experience some swelling and bruising in the area of the incisions.  Ice packs will help.  Swelling and bruising can take several days to resolve.   It is common to experience some constipation after surgery.  Increasing fluid intake and taking a stool softener (such as Colace) will usually help or prevent this problem from occurring.  A mild laxative (Milk of Magnesia or Miralax) should be taken according to package instructions if there has been no bowel movement after 48 hours.  You will likely have Dermabond (topical glue) over your incisions.  This seals the incisions and allows you to bathe and shower at any time after your surgery.  Glue should remain in place for up to 10 days.  It may be removed after 10 days by pealing off the Dermabond material or using Vaseline or naval jelly to remove.  If you have steri-strips over your incisions, you may remove the gauze bandage on the second day after surgery, and you may shower at that time.  Leave your steri-strips (small skin tapes) in place directly over the incision.  These strips should remain on the  skin for 5-7 days and then be removed.  You may get them wet in the shower and pat them dry.  Any sutures or staples will be removed at the office during your follow-up visit.  ACTIVITIES:  You may resume regular (light) daily activities beginning the next day - such as daily self-care, walking, climbing stairs - gradually increasing activities as tolerated.  You may have sexual intercourse when it is comfortable.  Refrain from any heavy lifting or straining until approved by your doctor.  You may drive when you are no longer taking prescription pain medication, when you can comfortably wear a seatbelt, and when you can safely maneuver your car and apply brakes.  You should see your doctor in the office for a follow-up appointment approximately 2-3 weeks after your surgery.  Make sure that you call for this appointment within a day or two after you arrive home to insure a convenient appointment time.  WHEN TO CALL YOUR DOCTOR: 1. Fever over 101.0 2. Inability to urinate 3. Continued bleeding from incision 4. Increased pain, redness, or drainage from the incision 5. Increasing abdominal pain  The clinic staff is available to answer your questions during regular business hours.  Please don't hesitate to call and ask to speak to one of the nurses for clinical concerns.  If you have a medical emergency, go to the nearest emergency room or call 911.  A surgeon from [facility] is always on call for the hospital.  Lori Constantin M. Zyler Hyson, MD, FACS [facility] Office: [phone] Toll Free:  [phone] FAX [phone]  Website: [email or web address] 

## 2019-12-02 NOTE — Anesthesia Postprocedure Evaluation (Signed)
Anesthesia Post Note  Patient: Lori Shea  Procedure(s) Performed: LAPAROSCOPIC CHOLECYSTECTOMY (N/A )     Patient location during evaluation: PACU Anesthesia Type: General Level of consciousness: awake and alert Pain management: pain level controlled Vital Signs Assessment: post-procedure vital signs reviewed and stable Respiratory status: spontaneous breathing, nonlabored ventilation, respiratory function stable and patient connected to nasal cannula oxygen Cardiovascular status: blood pressure returned to baseline and stable Postop Assessment: no apparent nausea or vomiting Anesthetic complications: no   No complications documented.  Last Vitals:  Vitals:   12/02/19 0630 12/02/19 0807  BP: 121/82   Pulse: 81   Resp: 17   Temp: 36.8 C   SpO2: 100% 98%    Last Pain:  Vitals:   12/02/19 0630  TempSrc: Oral  PainSc:                  Baldo Hufnagle S

## 2019-12-02 NOTE — Plan of Care (Signed)
  Problem: Education: Goal: Knowledge of General Education information will improve Description: Including pain rating scale, medication(s)/side effects and non-pharmacologic comfort measures Outcome: Progressing   Problem: Clinical Measurements: Goal: Will remain free from infection Outcome: Progressing Goal: Respiratory complications will improve Outcome: Progressing Goal: Cardiovascular complication will be avoided Outcome: Progressing   Problem: Activity: Goal: Risk for activity intolerance will decrease Outcome: Progressing   Problem: Coping: Goal: Level of anxiety will decrease Outcome: Progressing   Problem: Elimination: Goal: Will not experience complications related to bowel motility Outcome: Progressing Goal: Will not experience complications related to urinary retention Outcome: Progressing   Problem: Pain Managment: Goal: General experience of comfort will improve Outcome: Progressing   Problem: Safety: Goal: Ability to remain free from injury will improve Outcome: Progressing

## 2019-12-21 ENCOUNTER — Ambulatory Visit: Payer: Self-pay | Admitting: Nurse Practitioner

## 2019-12-23 ENCOUNTER — Encounter: Payer: Self-pay | Admitting: Nurse Practitioner

## 2019-12-23 ENCOUNTER — Other Ambulatory Visit: Payer: Self-pay

## 2019-12-23 ENCOUNTER — Ambulatory Visit (INDEPENDENT_AMBULATORY_CARE_PROVIDER_SITE_OTHER): Payer: Medicaid Other | Admitting: Nurse Practitioner

## 2019-12-23 VITALS — BP 114/73 | HR 90 | Temp 97.3°F | Ht 65.0 in | Wt 271.0 lb

## 2019-12-23 DIAGNOSIS — E1165 Type 2 diabetes mellitus with hyperglycemia: Secondary | ICD-10-CM

## 2019-12-23 DIAGNOSIS — Z9049 Acquired absence of other specified parts of digestive tract: Secondary | ICD-10-CM | POA: Diagnosis not present

## 2019-12-23 DIAGNOSIS — Z794 Long term (current) use of insulin: Secondary | ICD-10-CM | POA: Diagnosis not present

## 2019-12-23 DIAGNOSIS — I10 Essential (primary) hypertension: Secondary | ICD-10-CM | POA: Diagnosis not present

## 2019-12-23 NOTE — Progress Notes (Signed)
[address], [place]  [address] Phone:  [phone]   Fax:  [phone]    Established Patient Office Visit  Subjective:  Patient ID: Lori Shea, female    DOB: [birth year]  Age: 44 y.o. MRN: [id number]  CC:  Chief Complaint  Patient presents with  . Follow-up    had A1C checked before surgery 3weeks ago was 9.1.   requesting Tramdol script    HPI  Lori Shea presents for follow up. She  has a past medical history of Allergy, Anemia, Arthritis, Asthma (age 35 yo), Cholelithiasis (age 28 yo), COVID-19, Diabetes mellitus (2012), Elevated liver enzymes (09/13/2016), GERD (gastroesophageal reflux disease), Heart murmur, History of kidney stones, Hypertension (2015), Kidney stones, Migraines (2009), Morbid obesity ([place]), PCOS (polycystic ovarian syndrome) (2010), and Pneumonia.   Diabetes Mellitus Patient presents for follow up of diabetes. Current symptoms include: hyperglycemia and paresthesia of the feet. Symptoms have stabilized. Patient denies foot ulcerations, hypoglycemia , increased appetite, nausea, polydipsia, polyuria, visual disturbances, vomiting and weight loss. Evaluation to date has included: fasting blood sugar, fasting lipid panel, hemoglobin A1C and microalbuminuria.  Home sugars: BGs are high in the morning.150 am  Current treatment: more intensive attention to diet which has been somewhat effective, Continued insulin which has been somewhat effective, Continued metformin which has been somewhat effective and Continued statin which has been too early to assess effectiveness. Last dilated eye exam: 2021 New Rx for eye glasses . [facility]   She admits that she is still having some abdominal pain and cramping. She has started back to work.  She has been taking old Tramadol. She admits that she would like not to use the [employer] as much.  Past Medical History:  Diagnosis Date  . Allergy    Foods:  mango, honey, corn.  Allergic to  "everything tested"  15 years ago in [place]  . Anemia    with pregnancy  . Arthritis    Shoulder and hips  . Asthma age 89 yo   Triggers:  cold weather, exercise, allergens, anxiety, URIs  . Cholelithiasis age 84 yo   Asymptomatic  . COVID-19   . Diabetes mellitus 2012   Was diagnosed 2 years earlier with PCOS  . Elevated liver enzymes 09/13/2016  . GERD (gastroesophageal reflux disease)   . Heart murmur    with pregnancy  . History of kidney stones   . Hypertension 2015  . Kidney stones    family history of renal failure  . Migraines 2009  . Morbid obesity ([place])   . PCOS (polycystic ovarian syndrome) 2010  . Pneumonia     Past Surgical History:  Procedure Laterality Date  . CHOLECYSTECTOMY N/A 12/01/2019   Procedure: LAPAROSCOPIC CHOLECYSTECTOMY;  Surgeon: Armandina Gemma, MD;  Location: [facility];  Service: General;  Laterality: N/A;  . COLPOSCOPY N/A   . LAPAROSCOPIC TUBAL LIGATION Bilateral 01/28/2019   Procedure: LAPAROSCOPIC TUBAL LIGATION AND PAP SMEAR;  Surgeon: Aletha Halim, MD;  Location: [facility];  Service: Gynecology;  Laterality: Bilateral;  . TONSILLECTOMY AND ADENOIDECTOMY Bilateral 12/23/2018   Procedure: TONSILLECTOMY AND ADENOIDECTOMY;  Surgeon: Leta Baptist, MD;  Location: [facility];  Service: ENT;  Laterality: Bilateral;    Family History  Problem Relation Age of Onset  . Diabetes Father   . Hyperlipidemia Father   . Hypertension Father   . Heart disease Father   . Kidney disease Father  Developed after 2nd CABG  . Diabetes Paternal Grandfather   . Heart disease Paternal Grandfather   . Hyperlipidemia Paternal Grandfather   . Hypertension Paternal Grandfather   . Kidney disease Paternal Grandfather     Social History   Socioeconomic History  . Marital status: Divorced    Spouse name: Not on file  . Number of children: 1  . Years of education: some comm. college  . Highest education level: Not on file    Occupational History  . Occupation: CNA    Comment: Multimedia programmer  Tobacco Use  . Smoking status: Never Smoker  . Smokeless tobacco: Never Used  Vaping Use  . Vaping Use: Never used  Substance and Sexual Activity  . Alcohol use: No  . Drug use: No  . Sexual activity: Yes    Birth control/protection: None, Surgical    Comment: Sprintec.  Tubal ligation  Other Topics Concern  . Not on file  Social History Narrative   Originally from [place], [place] to [place] in 1988.   In [place] in [place] at home with daughter on [address].   Social Determinants of Health   Financial Resource Strain:   . Difficulty of Paying Living Expenses: Not on file  Food Insecurity:   . Worried About Charity fundraiser in the Last Year: Not on file  . Ran Out of Food in the Last Year: Not on file  Transportation Needs:   . Lack of Transportation (Medical): Not on file  . Lack of Transportation (Non-Medical): Not on file  Physical Activity:   . Days of Exercise per Week: Not on file  . Minutes of Exercise per Session: Not on file  Stress:   . Feeling of Stress : Not on file  Social Connections:   . Frequency of Communication with Friends and Family: Not on file  . Frequency of Social Gatherings with Friends and Family: Not on file  . Attends Religious Services: Not on file  . Active Member of Clubs or Organizations: Not on file  . Attends Archivist Meetings: Not on file  . Marital Status: Not on file  Intimate Partner Violence:   . Fear of Current or Ex-Partner: Not on file  . Emotionally Abused: Not on file  . Physically Abused: Not on file  . Sexually Abused: Not on file    Outpatient Medications Prior to Visit  Medication Sig Dispense Refill  . albuterol (PROVENTIL) (2.5 MG/3ML) 0.083% nebulizer solution Take 3 mLs (2.5 mg total) by nebulization every 6 (six) hours as needed for wheezing or shortness of breath. 75 mL 6  . albuterol  (VENTOLIN HFA) 108 (90 Base) MCG/ACT inhaler INHALE 2 PUFFS INTO THE LUNGS EVERY 4 HOURS AS NEEDED FOR WHEEZING ORSHORTNESS OF BREATH. (Patient taking differently: Inhale 2 puffs into the lungs every 4 (four) hours as needed for wheezing or shortness of breath. ) 6.7 g 0  . amitriptyline (ELAVIL) 25 MG tablet TAKE 1 TABLET BY MOUTH NIGHTLY AT BEDTIME (Patient taking differently: Take 25 mg by mouth at bedtime. ) 30 tablet 5  . amitriptyline (ELAVIL) 25 MG tablet TAKE 1 TABLET BY MOUTH NIGHTLY AT BEDTIME 30 tablet 5  . cetirizine (ZYRTEC) 10 MG tablet Take 1 tablet (10 mg total) by mouth daily. 30 tablet 11  . cyclobenzaprine (FLEXERIL) 5 MG tablet Take 1 tablet (5 mg total) by mouth 3 (three) times daily as needed for muscle spasms. 10 tablet  0  . Dexlansoprazole (DEXILANT) 30 MG capsule TAKE 1 CAPSULE (30MG  TOTAL) BY MOUTH DAILY. TAKE ON AN EMPTY STOMACH. (Patient taking differently: Take 30 mg by mouth daily. TAKE ON AN EMPTY STOMACH.) 90 capsule 0  . EASY COMFORT PEN NEEDLES 32G X 4 MM MISC use as directed test 2 times daily  3  . EDARBYCLOR 40-25 MG TABS TAKE 1 TABLET BY MOUTH DAILY. 30 tablet 0  . Fluticasone-Salmeterol (ADVAIR DISKUS) 100-50 MCG/DOSE AEPB INHALE 1 PUFF INTO THE LUNGS EVERY 12 HOURS. 2 each 0  . ibuprofen (ADVIL) 800 MG tablet Take 1 tablet (800 mg total) by mouth 3 (three) times daily. 21 tablet 0  . insulin aspart (NOVOLOG FLEXPEN) 100 UNIT/ML FlexPen Inject 10-14 Units into the skin 3 (three) times daily before meals. 30 mL 5  . insulin glargine (LANTUS SOLOSTAR) 100 UNIT/ML Solostar Pen Inject 50 Units into the skin 2 (two) times daily. (Patient taking differently: Inject 40 Units into the skin 2 (two) times daily. ) 10 pen 11  . Insulin Pen Needle (PEN NEEDLES) 32G X 5 MM MISC 100 each by Does not apply route 2 (two) times daily. 100 each 3  . MAGNESIUM CITRATE PO Take 1 capsule by mouth every morning.     Marland Kitchen MAXALT-MLT 10 MG disintegrating tablet TAKE 1 TABLET (10MG  TOTAL) BY  MOUTH AS NEEDED FOR MIGRAINE. MAY REPEAT IN 2 HOURS IF NEEDED. MAX 30MG /24 HOURS. (Patient taking differently: Take 10 mg by mouth as needed for migraine. May repeat in 2 hours if needed. Max of 30MG /24 hours) 12 tablet 0  . metFORMIN (GLUCOPHAGE) 500 MG tablet TAKE 1 TABLET BY MOUTH 2 TIMES DAILY WITH A MEAL 60 tablet 5  . montelukast (SINGULAIR) 10 MG tablet Take 1 tablet (10 mg total) by mouth daily. 90 tablet 3  . oxyCODONE (OXY IR/ROXICODONE) 5 MG immediate release tablet Take 1-2 tablets (5-10 mg total) by mouth every 4 (four) hours as needed for moderate pain. 20 tablet 0  . promethazine (PHENERGAN) 25 MG tablet Take 1 tablet (25 mg total) by mouth every 6 (six) hours as needed for nausea or vomiting. 12 tablet 0  . Psyllium (FIBER) 0.52 g CAPS Take 1 capsule by mouth at bedtime.     . simvastatin (ZOCOR) 5 MG tablet Take 1 tablet (5 mg total) by mouth at bedtime. 30 tablet 2  . HYDROcodone-acetaminophen (NORCO/VICODIN) 5-325 MG tablet Take 2 tablets by mouth every 4 (four) hours as needed. (Patient not taking: Reported on 12/23/2019) 10 tablet 0   No facility-administered medications prior to visit.    Allergies  Allergen Reactions  . Honey Shortness Of Breath  . Mango Flavor Hives and Swelling    Pt allergic to all mango - tongue and throat swelling  . Glipizide   . Victoza [Liraglutide] Nausea And Vomiting    ROS Review of Systems    Objective:    Physical Exam Constitutional:      General: She is not in acute distress.    Appearance: She is obese. She is not ill-appearing, toxic-appearing or diaphoretic.  HENT:     Head: Normocephalic and atraumatic.     Nose: Nose normal.     Mouth/Throat:     Mouth: Mucous membranes are moist.  Cardiovascular:     Rate and Rhythm: Normal rate and regular rhythm.     Pulses: Normal pulses.          Dorsalis pedis pulses are 2+ on the right side and  2+ on the left side.       Posterior tibial pulses are 2+ on the right side and 2+ on  the left side.     Heart sounds: Normal heart sounds.  Pulmonary:     Effort: Pulmonary effort is normal.     Breath sounds: Normal breath sounds.  Abdominal:     Tenderness: There is abdominal tenderness.  Musculoskeletal:        General: Normal range of motion.     Cervical back: Normal range of motion.  Feet:     Right foot:     Protective Sensation: 10 sites tested. 10 sites sensed.     Skin integrity: Skin integrity normal.     Toenail Condition: Right toenails are normal.     Left foot:     Protective Sensation: 10 sites tested. 10 sites sensed.     Skin integrity: Skin integrity normal.     Toenail Condition: Left toenails are normal.  Skin:    General: Skin is warm and dry.     Capillary Refill: Capillary refill takes less than 2 seconds.  Neurological:     General: No focal deficit present.     Mental Status: She is alert and oriented to person, place, and time.  Psychiatric:        Mood and Affect: Mood normal.        Behavior: Behavior normal.        Thought Content: Thought content normal.        Judgment: Judgment normal.     BP 114/73   Pulse 90   Temp (!) 97.3 F (36.3 C) (Temporal)   Ht 5\' 5"  (1.651 m)   Wt 271 lb (122.9 kg)   SpO2 98%   BMI 45.10 kg/m  Wt Readings from Last 3 Encounters:  12/23/19 271 lb (122.9 kg)  12/01/19 271 lb (122.9 kg)  11/26/19 271 lb (122.9 kg)     There are no preventive care reminders to display for this patient.  There are no preventive care reminders to display for this patient.  Lab Results  Component Value Date   TSH 5.090 (H) 02/10/2018   Lab Results  Component Value Date   WBC 8.3 11/26/2019   HGB 12.5 11/26/2019   HCT 37.5 11/26/2019   MCV 86.6 11/26/2019   PLT 278 11/26/2019   Lab Results  Component Value Date   NA 138 11/26/2019   K 3.4 (L) 11/26/2019   CO2 24 11/26/2019   GLUCOSE 250 (H) 11/26/2019   BUN 20 11/26/2019   CREATININE 0.62 11/26/2019   BILITOT 0.6 11/18/2019   ALKPHOS 79  11/18/2019   AST 20 11/18/2019   ALT 27 11/18/2019   PROT 7.1 11/18/2019   ALBUMIN 3.9 11/18/2019   CALCIUM 8.9 11/26/2019   ANIONGAP 11 11/26/2019   Lab Results  Component Value Date   CHOL 182 09/18/2019   Lab Results  Component Value Date   HDL 40 09/18/2019   Lab Results  Component Value Date   LDLCALC 112 (H) 09/18/2019   Lab Results  Component Value Date   TRIG 169 (H) 09/18/2019   Lab Results  Component Value Date   CHOLHDL 4.6 (H) 09/18/2019   Lab Results  Component Value Date   HGBA1C 9.7 (H) 11/26/2019      Assessment & Plan:   Problem List Items Addressed This Visit      Cardiovascular and Mediastinum   Hypertension Encouraged on going compliance with current  medication regimen Encouraged home monitoring and recording BP <130/80 Eating a heart-healthy diet with less salt Encouraged regular physical activity  Recommend Weight loss      Endocrine   Type 2 diabetes mellitus with hyperglycemia, with long-term current use of insulin (HCC) - Primary Encourage compliance with current treatment regimen  No dose adjustment today a1c has improved from to 9.7 will continue with current remgimen and diet  Encourage regular CBG monitoring  Encourage contacting office if excessive hyperglycemia and or hypoglycemia Lifestyle modification with healthy diet (fewer calories, more high fiber foods, whole grains and non-starchy vegetables, lower fat meat and fish, low-fat diary include healthy oils) regular exercise (physical activity) and weight loss Opthalmology exam discussed was completed in 2021 Nutritional consult recommended Regular dental visits encouraged Home BP monitoring also encouraged goal <130/80     Other Visit Diagnoses    S/P laparoscopic cholecystectomy   Pt requesting tramadol . She was directed to follow up with surgeon for any pain medication        No orders of the defined types were placed in this encounter.   Follow-up: Return in  about 3 months (around 03/23/2020).    Vevelyn Francois, NP

## 2019-12-23 NOTE — Patient Instructions (Signed)

## 2019-12-29 ENCOUNTER — Other Ambulatory Visit: Payer: Self-pay | Admitting: Nurse Practitioner

## 2019-12-29 DIAGNOSIS — E782 Mixed hyperlipidemia: Secondary | ICD-10-CM

## 2020-01-14 ENCOUNTER — Other Ambulatory Visit: Payer: Self-pay | Admitting: Nurse Practitioner

## 2020-01-14 DIAGNOSIS — I10 Essential (primary) hypertension: Secondary | ICD-10-CM

## 2020-01-14 DIAGNOSIS — J4530 Mild persistent asthma, uncomplicated: Secondary | ICD-10-CM

## 2020-01-14 DIAGNOSIS — K219 Gastro-esophageal reflux disease without esophagitis: Secondary | ICD-10-CM

## 2020-02-09 ENCOUNTER — Other Ambulatory Visit: Payer: Self-pay

## 2020-02-09 ENCOUNTER — Other Ambulatory Visit: Payer: Self-pay | Admitting: Nurse Practitioner

## 2020-02-11 ENCOUNTER — Other Ambulatory Visit: Payer: Self-pay

## 2020-02-11 ENCOUNTER — Encounter: Payer: Self-pay | Admitting: Nurse Practitioner

## 2020-02-11 ENCOUNTER — Other Ambulatory Visit: Payer: Self-pay | Admitting: Nurse Practitioner

## 2020-02-11 ENCOUNTER — Ambulatory Visit (INDEPENDENT_AMBULATORY_CARE_PROVIDER_SITE_OTHER): Payer: Medicaid Other | Admitting: Nurse Practitioner

## 2020-02-11 VITALS — BP 116/75 | HR 89 | Temp 97.3°F | Resp 17 | Ht 65.0 in | Wt 273.8 lb

## 2020-02-11 DIAGNOSIS — R110 Nausea: Secondary | ICD-10-CM | POA: Diagnosis not present

## 2020-02-11 DIAGNOSIS — Z794 Long term (current) use of insulin: Secondary | ICD-10-CM

## 2020-02-11 DIAGNOSIS — R197 Diarrhea, unspecified: Secondary | ICD-10-CM

## 2020-02-11 DIAGNOSIS — E1165 Type 2 diabetes mellitus with hyperglycemia: Secondary | ICD-10-CM | POA: Diagnosis not present

## 2020-02-11 DIAGNOSIS — K760 Fatty (change of) liver, not elsewhere classified: Secondary | ICD-10-CM

## 2020-02-11 LAB — POCT URINALYSIS DIPSTICK
Bilirubin, UA: NEGATIVE
Blood, UA: NEGATIVE
Glucose, UA: NEGATIVE
Ketones, UA: NEGATIVE
Leukocytes, UA: NEGATIVE
Nitrite, UA: NEGATIVE
Protein, UA: NEGATIVE
Spec Grav, UA: >=1.03 — AB (ref 1.010–1.025)
Urobilinogen, UA: 0.2 E.U./dL
pH, UA: 5 (ref 5.0–8.0)

## 2020-02-11 NOTE — Patient Instructions (Signed)
Abdominal Pain, Adult Pain in the abdomen (abdominal pain) can be caused by many things. Often, abdominal pain is not serious and it gets better with no treatment or by being treated at home. However, sometimes abdominal pain is serious. Your health care provider will ask questions about your medical history and do a physical exam to try to determine the cause of your abdominal pain. Follow these instructions at home:  Medicines  Take over-the-counter and prescription medicines only as told by your health care provider.  Do not take a laxative unless told by your health care provider. General instructions  Watch your condition for any changes.  Drink enough fluid to keep your urine pale yellow.  Keep all follow-up visits as told by your health care provider. This is important. Contact a health care provider if:  Your abdominal pain changes or gets worse.  You are not hungry or you lose weight without trying.  You are constipated or have diarrhea for more than 2-3 days.  You have pain when you urinate or have a bowel movement.  Your abdominal pain wakes you up at night.  Your pain gets worse with meals, after eating, or with certain foods.  You are vomiting and cannot keep anything down.  You have a fever.  You have blood in your urine. Get help right away if:  Your pain does not go away as soon as your health care provider told you to expect.  You cannot stop vomiting.  Your pain is only in areas of the abdomen, such as the right side or the left lower portion of the abdomen. Pain on the right side could be caused by appendicitis.  You have bloody or black stools, or stools that look like tar.  You have severe pain, cramping, or bloating in your abdomen.  You have signs of dehydration, such as: ? Dark urine, very little urine, or no urine. ? Cracked lips. ? Dry mouth. ? Sunken eyes. ? Sleepiness. ? Weakness.  You have trouble breathing or chest  pain. Summary  Often, abdominal pain is not serious and it gets better with no treatment or by being treated at home. However, sometimes abdominal pain is serious.  Watch your condition for any changes.  Take over-the-counter and prescription medicines only as told by your health care provider.  Contact a health care provider if your abdominal pain changes or gets worse.  Get help right away if you have severe pain, cramping, or bloating in your abdomen. This information is not intended to replace advice given to you by your health care provider. Make sure you discuss any questions you have with your health care provider. Document Revised: 08/18/2018 Document Reviewed: 08/18/2018 Elsevier Patient Education  2020 [employer]  

## 2020-02-11 NOTE — Progress Notes (Signed)
[place], [place]  [address] Phone:  [phone]   Fax:  [phone]   Established Patient Office Visit  Subjective:  Patient ID: Lori Shea, female    DOB: Jan 05, 1976  Age: 44 y.o. MRN: [id number]  CC:  Chief Complaint  Patient presents with   Follow-up    Pt states she is having some stomach pain and diarrhea headacheX2wks., and body ache.X3days    HPI MONAYE BLACKIE presents for  Diarrhea Patient complains of diarrhea. Onset of diarrhea was 2 days ago. Diarrhea is occurring approximately several on Tuesday. Pepto dismol  times per day. Patient describes diarrhea as black due to the Pepto, however brown before.. Diarrhea has been associated with abdominal pain described as cramping. Patient denies fever. Previous visits for diarrhea: none. Evaluation to date: none. She is SP chole. She did eat a half of hamburger. She was using "milk thissle" for a liver cleanse.  She is doing intermittent fasting and calorie counting.    Past Medical History:  Diagnosis Date   Allergy    Foods:  mango, honey, corn.  Allergic to "everything tested"  15 years ago in [place]   Anemia    with pregnancy   Arthritis    Shoulder and hips   Asthma age 84 yo   Triggers:  cold weather, exercise, allergens, anxiety, URIs   Cholelithiasis age 31 yo   Asymptomatic   COVID-19    Diabetes mellitus 2012   Was diagnosed 2 years earlier with PCOS   Elevated liver enzymes 09/13/2016   GERD (gastroesophageal reflux disease)    Heart murmur    with pregnancy   History of kidney stones    Hypertension 2015   Kidney stones    family history of renal failure   Migraines 2009   Morbid obesity ([place])    PCOS (polycystic ovarian syndrome) 2010   Pneumonia     Past Surgical History:  Procedure Laterality Date   CHOLECYSTECTOMY N/A 12/01/2019   Procedure: LAPAROSCOPIC CHOLECYSTECTOMY;  Surgeon: Armandina Gemma, MD;  Location: [facility];   Service: General;  Laterality: N/A;   COLPOSCOPY N/A    LAPAROSCOPIC TUBAL LIGATION Bilateral 01/28/2019   Procedure: LAPAROSCOPIC TUBAL LIGATION AND PAP SMEAR;  Surgeon: Aletha Halim, MD;  Location: [facility];  Service: Gynecology;  Laterality: Bilateral;   TONSILLECTOMY AND ADENOIDECTOMY Bilateral 12/23/2018   Procedure: TONSILLECTOMY AND ADENOIDECTOMY;  Surgeon: Leta Baptist, MD;  Location: [facility];  Service: ENT;  Laterality: Bilateral;    Family History  Problem Relation Age of Onset   Diabetes Father    Hyperlipidemia Father    Hypertension Father    Heart disease Father    Kidney disease Father        Developed after 2nd CABG   Diabetes Paternal Grandfather    Heart disease Paternal Grandfather    Hyperlipidemia Paternal Grandfather    Hypertension Paternal Grandfather    Kidney disease Paternal Grandfather     Social History   Socioeconomic History   Marital status: Divorced    Spouse name: Not on file   Number of children: 1   Years of education: some comm. college   Highest education level: Not on file  Occupational History   Occupation: CNA    Comment: [employer]  Tobacco Use   Smoking status: Never Smoker   Smokeless tobacco: Never Used  Scientific laboratory technician Use: Never used  Substance and Sexual Activity   Alcohol use: No   Drug use: No   Sexual activity: Yes    Birth control/protection: None, Surgical    Comment: Sprintec.  Tubal ligation  Other Topics Concern   Not on file  Social History Narrative   Originally from [place], [place] to [place] in 1988.   In [place] in [place] at home with daughter on [address].   Social Determinants of Health   Financial Resource Strain:    Difficulty of Paying Living Expenses: Not on file  Food Insecurity:    Worried About Charity fundraiser in the Last Year: Not on file   [employer] of Food in the Last Year: Not on  file  Transportation Needs:    Lack of Transportation (Medical): Not on file   Lack of Transportation (Non-Medical): Not on file  Physical Activity:    Days of Exercise per Week: Not on file   Minutes of Exercise per Session: Not on file  Stress:    Feeling of Stress : Not on file  Social Connections:    Frequency of Communication with Friends and Family: Not on file   Frequency of Social Gatherings with Friends and Family: Not on file   Attends Religious Services: Not on file   Active Member of Clubs or Organizations: Not on file   Attends Archivist Meetings: Not on file   Marital Status: Not on file  Intimate Partner Violence:    Fear of Current or Ex-Partner: Not on file   Emotionally Abused: Not on file   Physically Abused: Not on file   Sexually Abused: Not on file    Outpatient Medications Prior to Visit  Medication Sig Dispense Refill   albuterol (PROVENTIL) (2.5 MG/3ML) 0.083% nebulizer solution Take 3 mLs (2.5 mg total) by nebulization every 6 (six) hours as needed for wheezing or shortness of breath. 75 mL 6   albuterol (VENTOLIN HFA) 108 (90 Base) MCG/ACT inhaler INHALE 2 PUFFS INTO THE LUNGS EVERY 4 HOURS AS NEEDED FOR WHEEZING OR SHORTNESS OF BREATH. 1 each 0   amitriptyline (ELAVIL) 25 MG tablet TAKE 1 TABLET BY MOUTH NIGHTLY AT BEDTIME (Patient taking differently: Take 25 mg by mouth at bedtime. ) 30 tablet 5   cetirizine (ZYRTEC) 10 MG tablet Take 1 tablet (10 mg total) by mouth daily. 30 tablet 11   cyclobenzaprine (FLEXERIL) 5 MG tablet Take 1 tablet (5 mg total) by mouth 3 (three) times daily as needed for muscle spasms. 10 tablet 0   DEXILANT 30 MG capsule TAKE 1 CAPSULE (30MG  TOTAL) BY MOUTH DAILY. TAKE ON AN EMPTY STOMACH. 30 capsule 0   EASY COMFORT PEN NEEDLES 32G X 4 MM MISC use as directed test 2 times daily  3   EDARBYCLOR 40-25 MG TABS TAKE 1 TABLET BY MOUTH DAILY. 30 tablet 0   Fluticasone-Salmeterol (ADVAIR DISKUS)  100-50 MCG/DOSE AEPB INHALE 1 PUFF INTO THE LUNGS EVERY 12 HOURS. 1 each 0   ibuprofen (ADVIL) 800 MG tablet Take 1 tablet (800 mg total) by mouth 3 (three) times daily. 21 tablet 0   insulin aspart (NOVOLOG FLEXPEN) 100 UNIT/ML FlexPen Inject 10-14 Units into the skin 3 (three) times daily before meals. 30 mL 5   insulin glargine (LANTUS SOLOSTAR) 100 UNIT/ML Solostar Pen Inject 50 Units into the skin 2 (two) times daily. (Patient taking differently: Inject 40 Units into the skin 2 (two) times daily. ) 10  pen 11   Insulin Pen Needle (PEN NEEDLES) 32G X 5 MM MISC 100 each by Does not apply route 2 (two) times daily. 100 each 3   MAGNESIUM CITRATE PO Take 1 capsule by mouth every morning.      MAXALT-MLT 10 MG disintegrating tablet TAKE 1 TABLET (10MG  TOTAL) BY MOUTH AS NEEDED FOR MIGRAINE. MAY REPEAT IN 2 HOURS IF NEEDED. MAX 30MG /24 HOURS. (Patient taking differently: Take 10 mg by mouth as needed for migraine. May repeat in 2 hours if needed. Max of 30MG /24 hours) 12 tablet 0   metFORMIN (GLUCOPHAGE) 500 MG tablet TAKE 1 TABLET BY MOUTH 2 TIMES DAILY WITH A MEAL 60 tablet 5   montelukast (SINGULAIR) 10 MG tablet Take 1 tablet (10 mg total) by mouth daily. 90 tablet 3   promethazine (PHENERGAN) 25 MG tablet Take 1 tablet (25 mg total) by mouth every 6 (six) hours as needed for nausea or vomiting. 12 tablet 0   Psyllium (FIBER) 0.52 g CAPS Take 1 capsule by mouth at bedtime.      simvastatin (ZOCOR) 5 MG tablet TAKE 1 TABLET BY MOUTH NIGHTLY AT BEDTIME 30 tablet 2   oxyCODONE (OXY IR/ROXICODONE) 5 MG immediate release tablet Take 1-2 tablets (5-10 mg total) by mouth every 4 (four) hours as needed for moderate pain. (Patient not taking: Reported on 02/11/2020) 20 tablet 0   amitriptyline (ELAVIL) 25 MG tablet TAKE 1 TABLET BY MOUTH NIGHTLY AT BEDTIME 30 tablet 5   No facility-administered medications prior to visit.    Allergies  Allergen Reactions   Honey Shortness Of Breath    Mango Flavor Hives and Swelling    Pt allergic to all mango - tongue and throat swelling   Glipizide    Victoza [Liraglutide] Nausea And Vomiting    ROS Review of Systems  Constitutional: Negative for chills and fever.  Gastrointestinal: Positive for nausea. Negative for vomiting.      Objective:    Physical Exam Constitutional:      Appearance: She is obese.  HENT:     Head: Normocephalic and atraumatic.     Nose: Nose normal.     Mouth/Throat:     Mouth: Mucous membranes are moist.  Cardiovascular:     Rate and Rhythm: Normal rate and regular rhythm.     Pulses: Normal pulses.     Heart sounds: Normal heart sounds.  Pulmonary:     Effort: Pulmonary effort is normal.     Breath sounds: Normal breath sounds.  Abdominal:     Palpations: Abdomen is soft.  Musculoskeletal:     Cervical back: Normal range of motion.  Skin:    General: Skin is warm.     Capillary Refill: Capillary refill takes less than 2 seconds.  Neurological:     General: No focal deficit present.     Mental Status: She is alert and oriented to person, place, and time.  Psychiatric:        Mood and Affect: Mood normal.        Behavior: Behavior normal.        Thought Content: Thought content normal.        Judgment: Judgment normal.     BP 116/75 (BP Location: Left Arm, Patient Position: Sitting, Cuff Size: Large)    Pulse 89    Temp (!) 97.3 F (36.3 C)    Resp 17    Ht 5\' 5"  (1.651 m)    Wt 273 lb 12.8 oz (124.2  kg)    LMP 01/08/2020    SpO2 99%    BMI 45.56 kg/m  Wt Readings from Last 3 Encounters:  02/11/20 273 lb 12.8 oz (124.2 kg)  12/23/19 271 lb (122.9 kg)  12/01/19 271 lb (122.9 kg)     There are no preventive care reminders to display for this patient.  There are no preventive care reminders to display for this patient.  Lab Results  Component Value Date   TSH 2.780 02/11/2020   Lab Results  Component Value Date   WBC 10.3 02/11/2020   HGB 13.3 02/11/2020   HCT 40.4  02/11/2020   MCV 86 02/11/2020   PLT 301 02/11/2020   Lab Results  Component Value Date   NA 138 02/11/2020   K 3.8 02/11/2020   CO2 24 11/26/2019   GLUCOSE 304 (H) 02/11/2020   BUN 16 02/11/2020   CREATININE 0.70 02/11/2020   BILITOT <0.2 02/11/2020   ALKPHOS 111 02/11/2020   AST 26 02/11/2020   ALT 27 11/18/2019   PROT 6.4 02/11/2020   ALBUMIN 4.0 02/11/2020   CALCIUM 9.2 02/11/2020   ANIONGAP 11 11/26/2019   Lab Results  Component Value Date   CHOL 182 09/18/2019   Lab Results  Component Value Date   HDL 40 09/18/2019   Lab Results  Component Value Date   LDLCALC 112 (H) 09/18/2019   Lab Results  Component Value Date   TRIG 169 (H) 09/18/2019   Lab Results  Component Value Date   CHOLHDL 4.6 (H) 09/18/2019   Lab Results  Component Value Date   HGBA1C 9.7 (H) 11/26/2019      Assessment & Plan:   Problem List Items Addressed This Visit      Endocrine   Type 2 diabetes mellitus with hyperglycemia, with long-term current use of insulin (HCC) Encourage compliance with current treatment regimen   Encourage regular CBG monitoring Encourage contacting office if excessive hyperglycemia and or hypoglycemia Lifestyle modification with healthy diet (fewer calories, more high fiber foods, whole grains and non-starchy vegetables, lower fat meat and fish, low-fat diary include healthy oils) regular exercise (physical activity) and weight loss Home BP monitoring also encouraged goal <130/80   Relevant Orders   Urinalysis Dipstick (Completed)     Other   Morbid obesity ([place])   Relevant Orders   TSH    Other Visit Diagnoses    Diarrhea, unspecified type    -  Primary   Relevant Orders   Comp. Metabolic Panel (12)   CBC with Differential/Platelet   Ambulatory referral to Gastroenterology   Nausea       Relevant Orders   Comp. Metabolic Panel (12)   Ambulatory referral to Gastroenterology   Fatty liver       Relevant Orders   Ambulatory referral to  Gastroenterology      No orders of the defined types were placed in this encounter.   Follow-up: Return for Appointment As Scheduled.    Vevelyn Francois, NP

## 2020-02-12 LAB — CBC WITH DIFFERENTIAL/PLATELET
Basophils Absolute: 0.1 10*3/uL (ref 0.0–0.2)
Basos: 1 %
EOS (ABSOLUTE): 0.2 10*3/uL (ref 0.0–0.4)
Eos: 2 %
Hematocrit: 40.4 % (ref 34.0–46.6)
Hemoglobin: 13.3 g/dL (ref 11.1–15.9)
Immature Grans (Abs): 0 10*3/uL (ref 0.0–0.1)
Immature Granulocytes: 0 %
Lymphocytes Absolute: 3.2 10*3/uL — ABNORMAL HIGH (ref 0.7–3.1)
Lymphs: 31 %
MCH: 28.4 pg (ref 26.6–33.0)
MCHC: 32.9 g/dL (ref 31.5–35.7)
MCV: 86 fL (ref 79–97)
Monocytes Absolute: 0.6 10*3/uL (ref 0.1–0.9)
Monocytes: 6 %
Neutrophils Absolute: 6.2 10*3/uL (ref 1.4–7.0)
Neutrophils: 60 %
Platelets: 301 10*3/uL (ref 150–450)
RBC: 4.69 x10E6/uL (ref 3.77–5.28)
RDW: 13.5 % (ref 11.7–15.4)
WBC: 10.3 10*3/uL (ref 3.4–10.8)

## 2020-02-12 LAB — COMP. METABOLIC PANEL (12)
AST: 26 IU/L (ref 0–40)
Albumin/Globulin Ratio: 1.7 (ref 1.2–2.2)
Albumin: 4 g/dL (ref 3.8–4.8)
Alkaline Phosphatase: 111 IU/L (ref 44–121)
BUN/Creatinine Ratio: 23 (ref 9–23)
BUN: 16 mg/dL (ref 6–24)
Bilirubin Total: <0.2 mg/dL (ref 0.0–1.2)
Calcium: 9.2 mg/dL (ref 8.7–10.2)
Chloride: 98 mmol/L (ref 96–106)
Creatinine, Ser: 0.7 mg/dL (ref 0.57–1.00)
GFR calc Af Amer: 121 mL/min/{1.73_m2} (ref >59)
GFR calc non Af Amer: 105 mL/min/{1.73_m2} (ref >59)
Globulin, Total: 2.4 g/dL (ref 1.5–4.5)
Glucose: 304 mg/dL — ABNORMAL HIGH (ref 65–99)
Potassium: 3.8 mmol/L (ref 3.5–5.2)
Sodium: 138 mmol/L (ref 134–144)
Total Protein: 6.4 g/dL (ref 6.0–8.5)

## 2020-02-12 LAB — TSH: TSH: 2.78 u[IU]/mL (ref 0.450–4.500)

## 2020-02-18 ENCOUNTER — Other Ambulatory Visit: Payer: Self-pay | Admitting: Nurse Practitioner

## 2020-02-18 DIAGNOSIS — K219 Gastro-esophageal reflux disease without esophagitis: Secondary | ICD-10-CM

## 2020-02-18 DIAGNOSIS — J4530 Mild persistent asthma, uncomplicated: Secondary | ICD-10-CM

## 2020-02-18 DIAGNOSIS — I10 Essential (primary) hypertension: Secondary | ICD-10-CM

## 2020-03-01 ENCOUNTER — Other Ambulatory Visit: Payer: Medicaid Other

## 2020-03-01 ENCOUNTER — Other Ambulatory Visit: Payer: Self-pay

## 2020-03-03 ENCOUNTER — Other Ambulatory Visit: Payer: Medicaid Other

## 2020-03-03 ENCOUNTER — Other Ambulatory Visit: Payer: Self-pay

## 2020-03-03 ENCOUNTER — Other Ambulatory Visit: Payer: Self-pay | Admitting: Nurse Practitioner

## 2020-03-03 DIAGNOSIS — E1165 Type 2 diabetes mellitus with hyperglycemia: Secondary | ICD-10-CM

## 2020-03-03 DIAGNOSIS — R110 Nausea: Secondary | ICD-10-CM

## 2020-03-03 DIAGNOSIS — Z794 Long term (current) use of insulin: Secondary | ICD-10-CM

## 2020-03-04 ENCOUNTER — Other Ambulatory Visit: Payer: Medicaid Other

## 2020-03-04 LAB — COMP. METABOLIC PANEL (12)
AST: 22 IU/L (ref 0–40)
Albumin/Globulin Ratio: 1.5 (ref 1.2–2.2)
Albumin: 3.7 g/dL — ABNORMAL LOW (ref 3.8–4.8)
Alkaline Phosphatase: 95 IU/L (ref 44–121)
BUN/Creatinine Ratio: 18 (ref 9–23)
BUN: 13 mg/dL (ref 6–24)
Bilirubin Total: 0.3 mg/dL (ref 0.0–1.2)
Calcium: 8.7 mg/dL (ref 8.7–10.2)
Chloride: 103 mmol/L (ref 96–106)
Creatinine, Ser: 0.71 mg/dL (ref 0.57–1.00)
GFR calc Af Amer: 119 mL/min/{1.73_m2} (ref >59)
GFR calc non Af Amer: 103 mL/min/{1.73_m2} (ref >59)
Globulin, Total: 2.4 g/dL (ref 1.5–4.5)
Glucose: 249 mg/dL — ABNORMAL HIGH (ref 65–99)
Potassium: 4.2 mmol/L (ref 3.5–5.2)
Sodium: 138 mmol/L (ref 134–144)
Total Protein: 6.1 g/dL (ref 6.0–8.5)

## 2020-03-06 LAB — H. PYLORI BREATH TEST: H pylori Breath Test: NEGATIVE

## 2020-03-10 ENCOUNTER — Other Ambulatory Visit: Payer: Self-pay | Admitting: Nurse Practitioner

## 2020-03-14 NOTE — Telephone Encounter (Signed)
Please see patient request.

## 2020-03-23 ENCOUNTER — Ambulatory Visit: Payer: Self-pay | Admitting: Nurse Practitioner

## 2020-03-29 ENCOUNTER — Other Ambulatory Visit: Payer: Self-pay | Admitting: Family Medicine

## 2020-03-29 DIAGNOSIS — M545 Low back pain, unspecified: Secondary | ICD-10-CM

## 2020-03-31 NOTE — Telephone Encounter (Signed)
Medication is not on med list, please refill.

## 2020-03-31 NOTE — Telephone Encounter (Signed)
Please see refill request.

## 2020-04-04 ENCOUNTER — Other Ambulatory Visit: Payer: Self-pay | Admitting: Family Medicine

## 2020-04-04 ENCOUNTER — Other Ambulatory Visit: Payer: Self-pay | Admitting: Nurse Practitioner

## 2020-04-04 DIAGNOSIS — E782 Mixed hyperlipidemia: Secondary | ICD-10-CM

## 2020-04-04 MED ORDER — RIZATRIPTAN BENZOATE 10 MG PO TBDP: 10.0000 mg | ORAL_TABLET | ORAL | 0 refills | Status: DC | PRN

## 2020-04-04 NOTE — Telephone Encounter (Signed)
She missed her last apt she needs to reschedule so that she can get her medications during her office visit Thanks

## 2020-04-04 NOTE — Telephone Encounter (Signed)
Please see refill request.

## 2020-05-03 ENCOUNTER — Other Ambulatory Visit: Payer: Self-pay

## 2020-05-03 DIAGNOSIS — I10 Essential (primary) hypertension: Secondary | ICD-10-CM

## 2020-05-03 DIAGNOSIS — K219 Gastro-esophageal reflux disease without esophagitis: Secondary | ICD-10-CM

## 2020-05-04 MED ORDER — EDARBYCLOR 40-25 MG PO TABS: 1.0000 | ORAL_TABLET | Freq: Every day | ORAL | 0 refills | Status: DC

## 2020-05-04 MED ORDER — DEXILANT 30 MG PO CPDR: DELAYED_RELEASE_CAPSULE | ORAL | 0 refills | Status: DC

## 2020-05-05 ENCOUNTER — Other Ambulatory Visit: Payer: Self-pay | Admitting: Nurse Practitioner

## 2020-05-05 DIAGNOSIS — G4700 Insomnia, unspecified: Secondary | ICD-10-CM

## 2020-05-05 NOTE — Telephone Encounter (Signed)
Is this okay to refill? 

## 2020-05-06 ENCOUNTER — Encounter: Payer: Self-pay | Admitting: Nurse Practitioner

## 2020-05-06 ENCOUNTER — Other Ambulatory Visit: Payer: Self-pay

## 2020-05-06 ENCOUNTER — Telehealth (INDEPENDENT_AMBULATORY_CARE_PROVIDER_SITE_OTHER): Payer: Medicaid Other | Admitting: Nurse Practitioner

## 2020-05-06 VITALS — Temp 97.5°F | Ht 65.0 in | Wt 273.0 lb

## 2020-05-06 DIAGNOSIS — J069 Acute upper respiratory infection, unspecified: Secondary | ICD-10-CM

## 2020-05-06 MED ORDER — AMOXICILLIN-POT CLAVULANATE 875-125 MG PO TABS: 1.0000 | ORAL_TABLET | Freq: Two times a day (BID) | ORAL | 0 refills | Status: AC

## 2020-05-06 NOTE — Progress Notes (Signed)
   [address] [place], [place]  [address] Phone:  [phone]   Fax:  [phone] Virtual Visit via Telephone Note  I connected with [name] on 05/06/20 at  3:20 PM EST by telephone and verified that I am speaking with the correct person using two identifiers.   I discussed the limitations, risks, security and privacy concerns of performing an evaluation and management service by telephone and the availability of in person appointments. I also discussed with the patient that there may be a patient responsible charge related to this service. The patient expressed understanding and agreed to proceed.  Patient home Provider Office  History of Present Illness:   Upper Respiratory Infection Patient complains of symptoms of a URI, possible sinusitis. Symptoms include congestion, facial pain, headache described as migraines  and sinus pressure. Onset of symptoms was several weeks ago, and has been stable since that time. Treatment to date:  cough suppressants and decongestants. Suafed and advil with little to no relief.   Observations/Objective: Virtual no exam   Assessment and Plan: Assessment  Primary Diagnosis & Pertinent Problem List: The encounter diagnosis was Upper respiratory tract infection, unspecified type.  Visit Diagnosis: 1. Upper respiratory tract infection, unspecified type  Mucinex D and Augmentin as directed     Follow Up Instructions:    I discussed the assessment and treatment plan with the patient. The patient was provided an opportunity to ask questions and all were answered. The patient agreed with the plan and demonstrated an understanding of the instructions.   The patient was advised to call back or seek an in-person evaluation if the symptoms worsen or if the condition fails to improve as anticipated.  I provided 9 minutes of non-face-to-face time during this encounter.   Vevelyn Francois, NP

## 2020-05-13 ENCOUNTER — Ambulatory Visit: Payer: Medicaid Other | Admitting: Internal Medicine

## 2020-05-24 ENCOUNTER — Other Ambulatory Visit: Payer: Self-pay | Admitting: Family Medicine

## 2020-05-24 DIAGNOSIS — K219 Gastro-esophageal reflux disease without esophagitis: Secondary | ICD-10-CM

## 2020-05-24 MED ORDER — DEXLANSOPRAZOLE 30 MG PO CPDR: DELAYED_RELEASE_CAPSULE | ORAL | 0 refills | Status: DC

## 2020-05-30 ENCOUNTER — Other Ambulatory Visit: Payer: Self-pay | Admitting: Nurse Practitioner

## 2020-05-30 DIAGNOSIS — I10 Essential (primary) hypertension: Secondary | ICD-10-CM

## 2020-05-30 NOTE — Telephone Encounter (Signed)
Please see pharmacy note. Thanks!

## 2020-06-11 ENCOUNTER — Ambulatory Visit (HOSPITAL_COMMUNITY)
Admission: EM | Admit: 2020-06-11 | Discharge: 2020-06-11 | Disposition: A | Discharge status: Home / Self Care | Payer: 59 | Attending: Internal Medicine | Admitting: Internal Medicine

## 2020-06-11 ENCOUNTER — Encounter (HOSPITAL_COMMUNITY): Payer: Self-pay | Admitting: *Deleted

## 2020-06-11 ENCOUNTER — Other Ambulatory Visit: Payer: Self-pay

## 2020-06-11 DIAGNOSIS — R197 Diarrhea, unspecified: Secondary | ICD-10-CM

## 2020-06-11 DIAGNOSIS — Z3202 Encounter for pregnancy test, result negative: Secondary | ICD-10-CM | POA: Diagnosis not present

## 2020-06-11 DIAGNOSIS — R1110 Vomiting, unspecified: Secondary | ICD-10-CM

## 2020-06-11 DIAGNOSIS — R509 Fever, unspecified: Secondary | ICD-10-CM | POA: Diagnosis not present

## 2020-06-11 DIAGNOSIS — Z789 Other specified health status: Secondary | ICD-10-CM

## 2020-06-11 DIAGNOSIS — R109 Unspecified abdominal pain: Secondary | ICD-10-CM | POA: Diagnosis not present

## 2020-06-11 DIAGNOSIS — N3001 Acute cystitis with hematuria: Secondary | ICD-10-CM | POA: Diagnosis not present

## 2020-06-11 LAB — POCT URINALYSIS DIPSTICK, ED / UC
Glucose, UA: 100 mg/dL — AB
Hgb urine dipstick: NEGATIVE
Nitrite: POSITIVE — AB
Protein, ur: 30 mg/dL — AB
Specific Gravity, Urine: 1.02 (ref 1.005–1.030)
Urobilinogen, UA: 0.2 mg/dL (ref 0.0–1.0)
pH: 7 (ref 5.0–8.0)

## 2020-06-11 LAB — POC URINE PREG, ED: Preg Test, Ur: NEGATIVE

## 2020-06-11 MED ORDER — SULFAMETHOXAZOLE-TRIMETHOPRIM 800-160 MG PO TABS: 1.0000 | ORAL_TABLET | Freq: Two times a day (BID) | ORAL | 0 refills | Status: AC

## 2020-06-11 MED ORDER — PROMETHAZINE HCL 25 MG RE SUPP: 25.0000 mg | Freq: Four times a day (QID) | RECTAL | 0 refills | Status: DC | PRN

## 2020-06-11 NOTE — ED Triage Notes (Signed)
Pt reports Sx's started today. Pt has had N/V ,diarrhera ,ear pain and ABD pain . Pt also reports dysuria.

## 2020-06-11 NOTE — ED Provider Notes (Signed)
[facility] [facility]    CSN: [id number] Arrival date & time: 06/11/20  1608      History   Chief Complaint Chief Complaint  Patient presents with  . Fever  . Abdominal Pain  . Diarrhea  . Emesis    HPI Lori Shea is a 45 y.o. female.   HPI  Emesis: Pt reports that starting this morning she has had fever, abdominal pain, diarrhea and vomiting.  She states that her family ate out to dinner last night but she is the only one who is sick currently. Of note she does have DM2 and has been taking her medications as directed.  She states that she has vomited upwards of 10-20 times this morning and has had a few episodes of diarrhea.  She denies any blood is seen in both her vomit and diarrhea.  She has had some generalized abdominal pain mainly from nausea.  She has had some dysuria and urinary frequency.  She did have her sugar checked this morning at home and it was 224 her normal is around 120.  She has tried to stay hydrated with water.No recent UTI. [facility] 05/21/2020   Past Medical History:  Diagnosis Date  . Allergy    Foods:  mango, honey, corn.  Allergic to "everything tested"  15 years ago in [place]  . Anemia    with pregnancy  . Arthritis    Shoulder and hips  . Asthma age 1 yo   Triggers:  cold weather, exercise, allergens, anxiety, URIs  . Cholelithiasis age 58 yo   Asymptomatic  . COVID-19   . Diabetes mellitus 2012   Was diagnosed 2 years earlier with PCOS  . Elevated liver enzymes 09/13/2016  . GERD (gastroesophageal reflux disease)   . Heart murmur    with pregnancy  . History of kidney stones   . Hypertension 2015  . Kidney stones    family history of renal failure  . Migraines 2009  . Morbid obesity ([place])   . PCOS (polycystic ovarian syndrome) 2010  . Pneumonia     Patient Active Problem List   Diagnosis Date Noted  . Hepatic steatosis 12/01/2019  . 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/2019  . Hemoglobin A1C greater than 9%,  indicating poor diabetic control 03/23/2019  . Hyperglycemia 03/23/2019  . Class 3 severe obesity due to excess calories with serious comorbidity and body mass index (BMI) of 40.0 to 44.9 in adult ([place]) 03/23/2019  . Yeast infection 03/23/2019  . BMI 40.0-44.9, adult ([place]) 03/09/2019  . Cervical dysplasia 07/31/2017  . Alopecia of scalp 05/09/2017  . Pain in left toe(s) 12/27/2016  . Closed avulsion fracture of lateral malleolus of right fibula 12/27/2016  . Morbid obesity ([place]) 09/13/2016  . Elevated liver enzymes 09/13/2016  . Allergy   . Asthma   . Hypertension 04/23/2013  . Type 2 diabetes mellitus with hyperglycemia, with long-term current use of insulin ([place]) 04/23/2010  . PCOS (polycystic ovarian syndrome) 04/23/2008  . Migraines 04/24/2007    Past Surgical History:  Procedure Laterality Date  . CHOLECYSTECTOMY N/A 12/01/2019   Procedure: LAPAROSCOPIC CHOLECYSTECTOMY;  Surgeon: Armandina Gemma, MD;  [facility]: [facility];  Service: General;  Laterality: N/A;  . COLPOSCOPY N/A   . LAPAROSCOPIC TUBAL LIGATION Bilateral 01/28/2019   Procedure: LAPAROSCOPIC TUBAL LIGATION AND PAP SMEAR;  Surgeon: Aletha Halim, MD;  Location: [facility];  Service: Gynecology;  Laterality: Bilateral;  . TONSILLECTOMY AND ADENOIDECTOMY Bilateral 12/23/2018   Procedure: TONSILLECTOMY AND  ADENOIDECTOMY;  Surgeon: Leta Baptist, MD;  Location: [facility];  Service: ENT;  Laterality: Bilateral;    OB History    Gravida  4   Para  1   Term  1   Preterm      AB  3   Living  1     SAB  3   IAB      Ectopic      Multiple      Live Births               Home Medications    Prior to Admission medications   Medication Sig Start Date End Date Taking? Authorizing Provider  albuterol (PROVENTIL) (2.5 MG/3ML) 0.083% nebulizer solution Take 3 mLs (2.5 mg total) by nebulization every 6 (six) hours as needed for wheezing or shortness of breath. 09/18/19   Vevelyn Francois,  NP  albuterol (VENTOLIN HFA) 108 (90 Base) MCG/ACT inhaler INHALE 2 PUFFS INTO THE LUNGS EVERY 4 HOURS AS NEEDED FOR WHEEZING OR SHORTNESS OF BREATH. 03/14/20   Vevelyn Francois, NP  amitriptyline (ELAVIL) 25 MG tablet TAKE 1 TABLET BY MOUTH NIGHTLY AT BEDTIME 05/05/20   Vevelyn Francois, NP  Azilsartan-Chlorthalidone (EDARBYCLOR) 40-25 MG TABS Take 1 tablet by mouth daily. Patient not taking: Reported on 05/06/2020 05/04/20 08/02/20  Vevelyn Francois, NP  cetirizine (ZYRTEC) 10 MG tablet Take 1 tablet (10 mg total) by mouth daily. 09/18/19   Vevelyn Francois, NP  cyclobenzaprine (FLEXERIL) 5 MG tablet Take 1 tablet (5 mg total) by mouth 3 (three) times daily as needed for muscle spasms. Patient not taking: Reported on 05/06/2020 11/02/19   Drenda Freeze, MD  Dexlansoprazole (DEXILANT) 30 MG capsule Take 1 tablet daily. 05/24/20   Azzie Glatter, FNP  EASY COMFORT PEN NEEDLES 32G X 4 MM MISC use as directed test 2 times daily 05/14/17   [provider]  Fluticasone-Salmeterol (ADVAIR DISKUS) 100-50 MCG/DOSE AEPB INHALE 1 PUFF INTO THE LUNGS EVERY 12 HOURS. 02/19/20   Vevelyn Francois, NP  ibuprofen (ADVIL) 800 MG tablet Take 1 tablet (800 mg total) by mouth 3 (three) times daily. 11/02/19   Drenda Freeze, MD  insulin aspart (NOVOLOG FLEXPEN) 100 UNIT/ML FlexPen Inject 10-14 Units into the skin 3 (three) times daily before meals. 06/30/19   Azzie Glatter, FNP  insulin glargine (LANTUS SOLOSTAR) 100 UNIT/ML Solostar Pen Inject 50 Units into the skin 2 (two) times daily. Patient taking differently: Inject 40 Units into the skin 2 (two) times daily. 10/01/19   Vevelyn Francois, NP  Insulin Pen Needle (PEN NEEDLES) 32G X 5 MM MISC 100 each by Does not apply route 2 (two) times daily. 05/14/17   Dorena Dew, FNP  MAGNESIUM CITRATE PO Take 1 capsule by mouth every morning.     [provider]  metFORMIN (GLUCOPHAGE) 500 MG tablet TAKE 1 TABLET BY MOUTH 2 TIMES DAILY WITH A MEAL 11/19/19    Vevelyn Francois, NP  montelukast (SINGULAIR) 10 MG tablet Take 1 tablet (10 mg total) by mouth daily. 11/06/19 10/31/20  Vevelyn Francois, NP  Psyllium (FIBER) 0.52 g CAPS Take 1 capsule by mouth at bedtime.  Patient not taking: Reported on 05/06/2020    [provider]  rizatriptan (MAXALT-MLT) 10 MG disintegrating tablet Take 1 tablet (10 mg total) by mouth as needed for migraine. May repeat in 2 hours if needed 04/04/20   Vevelyn Francois, NP  simvastatin (  ZOCOR) 5 MG tablet TAKE 1 TABLET BY MOUTH NIGHTLY AT BEDTIME 04/04/20   Vevelyn Francois, NP    Family History Family History  Problem Relation Age of Onset  . Diabetes Father   . Hyperlipidemia Father   . Hypertension Father   . Heart disease Father   . Kidney disease Father        Developed after 2nd CABG  . Diabetes Paternal Grandfather   . Heart disease Paternal Grandfather   . Hyperlipidemia Paternal Grandfather   . Hypertension Paternal Grandfather   . Kidney disease Paternal Grandfather     Social History Social History   Tobacco Use  . Smoking status: Never Smoker  . Smokeless tobacco: Never Used  Vaping Use  . Vaping Use: Never used  Substance Use Topics  . Alcohol use: No  . Drug use: No     Allergies   Honey, Mango flavor, Glipizide, and Victoza [liraglutide]   Review of Systems Review of Systems  As stated above in HPI Physical Exam Triage Vital Signs ED Triage Vitals  Enc Vitals Group     BP 06/11/20 1622 129/81     Pulse Rate 06/11/20 1622 (!) 101     Resp 06/11/20 1622 20     Temp 06/11/20 1622 (!) 100.5 F (38.1 C)     Temp Source 06/11/20 1622 Oral     SpO2 06/11/20 1622 98 %     Weight --      Height --      Head Circumference --      Peak Flow --      Pain Score 06/11/20 1617 8     Pain Loc --      Pain Edu? --      Excl. in [place]? --    No data found.  Updated Vital Signs BP 129/81 (BP Location: Right Arm)   Pulse (!) 101   Temp (!) 100.5 F (38.1 C) (Oral)   Resp 20    LMP 05/21/2020   SpO2 98%   Physical Exam Vitals and nursing note reviewed.  Constitutional:      General: She is not in acute distress.    Appearance: She is well-developed. She is not ill-appearing, toxic-appearing or diaphoretic.  HENT:     Mouth/Throat:     Mouth: Mucous membranes are moist.  Cardiovascular:     Rate and Rhythm: Normal rate and regular rhythm.     Heart sounds: Normal heart sounds.  Pulmonary:     Effort: Pulmonary effort is normal.     Breath sounds: Normal breath sounds.  Abdominal:     General: Abdomen is flat. Bowel sounds are normal. There is no distension. There are no signs of injury.     Palpations: Abdomen is soft. There is no hepatomegaly, splenomegaly, mass or pulsatile mass.     Tenderness: There is no abdominal tenderness. There is no right CVA tenderness, left CVA tenderness, guarding or rebound. Negative signs include Murphy's sign and McBurney's sign.     Hernia: No hernia is present.  Skin:    General: Skin is warm.  Neurological:     Mental Status: She is alert.      UC Treatments / Results  Labs (all labs ordered are listed, but only abnormal results are displayed) Labs Reviewed  POCT URINALYSIS DIPSTICK, ED / UC - Abnormal; Notable for the following components:      Result Value   Glucose, UA 100 (*)    Bilirubin  Urine SMALL (*)    Ketones, ur TRACE (*)    Protein, ur 30 (*)    Nitrite POSITIVE (*)    Leukocytes,Ua TRACE (*)    All other components within normal limits  POC URINE PREG, ED    EKG   Radiology No results found.  Procedures Procedures (including critical care time)  Medications Ordered in UC Medications - No data to display  Initial Impression / Assessment and Plan / UC Course  I have reviewed the triage vital signs and the nursing notes.  Pertinent labs & imaging results that were available during my care of the patient were reviewed by me and considered in my medical decision making (see chart for  details).     New. Acute cystitis with hematuria.  No CVA tenderness.  Patient does not appear to be in acute distress here in office and the fact that she is able to tolerate a few sips of water without vomiting does give me hope that she might be able to be treated outpatient but I did discuss with patient that if the Phenergan I prescribed for her does not cure her nausea and she is not able to hold down fluids and liquids or she feels worse she needs to be seen and treated in the emergency room. Close monitoring of glucose values. Low threshold for ER.  Final Clinical Impressions(s) / UC Diagnoses   Final diagnoses:  None   Discharge Instructions   None    ED Prescriptions    None     PDMP not reviewed this encounter.   Hughie Closs, [place] 06/11/20 1641

## 2020-06-24 ENCOUNTER — Encounter: Payer: Self-pay | Admitting: Nurse Practitioner

## 2020-06-24 ENCOUNTER — Other Ambulatory Visit: Payer: Self-pay

## 2020-06-24 ENCOUNTER — Ambulatory Visit (INDEPENDENT_AMBULATORY_CARE_PROVIDER_SITE_OTHER): Payer: 59 | Admitting: Nurse Practitioner

## 2020-06-24 VITALS — BP 130/81 | HR 72 | Temp 97.9°F | Ht 65.0 in | Wt 279.0 lb

## 2020-06-24 DIAGNOSIS — E1165 Type 2 diabetes mellitus with hyperglycemia: Secondary | ICD-10-CM

## 2020-06-24 DIAGNOSIS — E1169 Type 2 diabetes mellitus with other specified complication: Secondary | ICD-10-CM | POA: Diagnosis not present

## 2020-06-24 DIAGNOSIS — R350 Frequency of micturition: Secondary | ICD-10-CM | POA: Diagnosis not present

## 2020-06-24 DIAGNOSIS — E782 Mixed hyperlipidemia: Secondary | ICD-10-CM

## 2020-06-24 DIAGNOSIS — I10 Essential (primary) hypertension: Secondary | ICD-10-CM | POA: Diagnosis not present

## 2020-06-24 DIAGNOSIS — Z794 Long term (current) use of insulin: Secondary | ICD-10-CM

## 2020-06-24 LAB — POCT URINALYSIS DIPSTICK
Bilirubin, UA: NEGATIVE
Blood, UA: NEGATIVE
Glucose, UA: NEGATIVE
Ketones, UA: NEGATIVE
Nitrite, UA: NEGATIVE
Protein, UA: NEGATIVE
Spec Grav, UA: >=1.03 — AB (ref 1.010–1.025)
Urobilinogen, UA: 0.2 E.U./dL
pH, UA: 6 (ref 5.0–8.0)

## 2020-06-24 MED ORDER — FLUOCINONIDE 0.05 % EX SOLN: 1.0000 "application " | Freq: Two times a day (BID) | CUTANEOUS | 0 refills | Status: DC

## 2020-06-24 MED ORDER — OLMESARTAN MEDOXOMIL-HCTZ 40-25 MG PO TABS: 1.0000 | ORAL_TABLET | Freq: Every day | ORAL | 3 refills | Status: DC

## 2020-06-24 MED ORDER — ESOMEPRAZOLE MAGNESIUM 20 MG PO CPDR: 20.0000 mg | DELAYED_RELEASE_CAPSULE | Freq: Every evening | ORAL | 3 refills | Status: DC

## 2020-06-24 MED ORDER — NITROFURANTOIN MONOHYD MACRO 100 MG PO CAPS: 100.0000 mg | ORAL_CAPSULE | Freq: Two times a day (BID) | ORAL | 0 refills | Status: AC

## 2020-06-24 NOTE — Progress Notes (Signed)
[place], [place]  [address] Phone:  [phone]   Fax:  [phone]   Acute Office Visit  Subjective:    Patient ID: Lori Shea, female    DOB: May 09, 1975, 45 y.o.   MRN: [id number]  Chief Complaint  Patient presents with  . Urinary Tract Infection    Possible uti , pressure when going to bathroom, odor with urine , discuss htn medication, was not able to get anything for htn  due to insurance     HPI Patient is in today for UTI  Urinary Tract Infection Patient complains of abnormal smelling urine, frequency, urgency and pressure and back pain. . She has had symptoms for several days. Patient also complains of back pain. Patient denies congestion, cough, headache, rhinitis, sorethroat and stomach ache. Patient does have a history of recurrent UTI. Patient does not have a history of pyelonephritis.      Past Medical History:  Diagnosis Date  . Allergy    Foods:  mango, honey, corn.  Allergic to "everything tested"  15 years ago in [place]  . Anemia    with pregnancy  . Arthritis    Shoulder and hips  . Asthma age 50 yo   Triggers:  cold weather, exercise, allergens, anxiety, URIs  . Cholelithiasis age 68 yo   Asymptomatic  . COVID-19   . Diabetes mellitus 2012   Was diagnosed 2 years earlier with PCOS  . Elevated liver enzymes 09/13/2016  . GERD (gastroesophageal reflux disease)   . Heart murmur    with pregnancy  . History of kidney stones   . Hypertension 2015  . Kidney stones    family history of renal failure  . Migraines 2009  . Morbid obesity ([place])   . PCOS (polycystic ovarian syndrome) 2010  . Pneumonia     Past Surgical History:  Procedure Laterality Date  . CHOLECYSTECTOMY N/A 12/01/2019   Procedure: LAPAROSCOPIC CHOLECYSTECTOMY;  Surgeon: Armandina Gemma, MD;  [facility];  Service: General;  Laterality: N/A;  . COLPOSCOPY N/A   . LAPAROSCOPIC TUBAL LIGATION Bilateral 01/28/2019   Procedure:  LAPAROSCOPIC TUBAL LIGATION AND PAP SMEAR;  Surgeon: Aletha Halim, MD;  Location: [facility];  Service: Gynecology;  Laterality: Bilateral;  . TONSILLECTOMY AND ADENOIDECTOMY Bilateral 12/23/2018   Procedure: TONSILLECTOMY AND ADENOIDECTOMY;  Surgeon: Leta Baptist, MD;  Location: [facility];  Service: ENT;  Laterality: Bilateral;    Family History  Problem Relation Age of Onset  . Diabetes Father   . Hyperlipidemia Father   . Hypertension Father   . Heart disease Father   . Kidney disease Father        Developed after 2nd CABG  . Diabetes Paternal Grandfather   . Heart disease Paternal Grandfather   . Hyperlipidemia Paternal Grandfather   . Hypertension Paternal Grandfather   . Kidney disease Paternal Grandfather     Social History   Socioeconomic History  . Marital status: Divorced    Spouse name: Not on file  . Number of children: 1  . Years of education: some comm. college  . Highest education level: Not on file  Occupational History  . Occupation: CNA    Comment: Multimedia programmer  Tobacco Use  . Smoking status: Never Smoker  . Smokeless tobacco: Never Used  Vaping Use  . Vaping Use: Never used  Substance and Sexual Activity  . Alcohol use: No  . Drug use: No  .  Sexual activity: Yes    Birth control/protection: None, Surgical    Comment: Sprintec.  Tubal ligation  Other Topics Concern  . Not on file  Social History Narrative   Originally from [place], [place] to [place] in 1988.   In [place] in [place] at home with daughter on [place].   Social Determinants of Health   Financial Resource Strain: Not on file  Food Insecurity: Not on file  Transportation Needs: Not on file  Physical Activity: Not on file  Stress: Not on file  Social Connections: Not on file  Intimate Partner Violence: Not on file    Outpatient Medications Prior to Visit  Medication Sig Dispense Refill  . albuterol  (PROVENTIL) (2.5 MG/3ML) 0.083% nebulizer solution Take 3 mLs (2.5 mg total) by nebulization every 6 (six) hours as needed for wheezing or shortness of breath. 75 mL 6  . albuterol (VENTOLIN HFA) 108 (90 Base) MCG/ACT inhaler INHALE 2 PUFFS INTO THE LUNGS EVERY 4 HOURS AS NEEDED FOR WHEEZING OR SHORTNESS OF BREATH. 1 each 0  . amitriptyline (ELAVIL) 25 MG tablet TAKE 1 TABLET BY MOUTH NIGHTLY AT BEDTIME 30 tablet 5  . cetirizine (ZYRTEC) 10 MG tablet Take 1 tablet (10 mg total) by mouth daily. 30 tablet 11  . cyclobenzaprine (FLEXERIL) 5 MG tablet Take 1 tablet (5 mg total) by mouth 3 (three) times daily as needed for muscle spasms. 10 tablet 0  . EASY COMFORT PEN NEEDLES 32G X 4 MM MISC use as directed test 2 times daily  3  . Fluticasone-Salmeterol (ADVAIR DISKUS) 100-50 MCG/DOSE AEPB INHALE 1 PUFF INTO THE LUNGS EVERY 12 HOURS. 1 each 0  . ibuprofen (ADVIL) 800 MG tablet Take 1 tablet (800 mg total) by mouth 3 (three) times daily. 21 tablet 0  . insulin aspart (NOVOLOG FLEXPEN) 100 UNIT/ML FlexPen Inject 10-14 Units into the skin 3 (three) times daily before meals. 30 mL 5  . insulin glargine (LANTUS SOLOSTAR) 100 UNIT/ML Solostar Pen Inject 50 Units into the skin 2 (two) times daily. (Patient taking differently: Inject 40 Units into the skin 2 (two) times daily.) 10 pen 11  . Insulin Pen Needle (PEN NEEDLES) 32G X 5 MM MISC 100 each by Does not apply route 2 (two) times daily. 100 each 3  . MAGNESIUM CITRATE PO Take 1 capsule by mouth every morning.     . metFORMIN (GLUCOPHAGE) 500 MG tablet TAKE 1 TABLET BY MOUTH 2 TIMES DAILY WITH A MEAL 60 tablet 5  . montelukast (SINGULAIR) 10 MG tablet Take 1 tablet (10 mg total) by mouth daily. 90 tablet 3  . rizatriptan (MAXALT-MLT) 10 MG disintegrating tablet Take 1 tablet (10 mg total) by mouth as needed for migraine. May repeat in 2 hours if needed 12 tablet 0  . simvastatin (ZOCOR) 5 MG tablet TAKE 1 TABLET BY MOUTH NIGHTLY AT BEDTIME 30 tablet 2  .  Dexlansoprazole (DEXILANT) 30 MG capsule Take 1 tablet daily. 30 capsule 0  . esomeprazole (NEXIUM) 20 MG capsule Take 20 mg by mouth daily at 12 noon.    . Psyllium (FIBER) 0.52 g CAPS Take 1 capsule by mouth at bedtime.    . Azilsartan-Chlorthalidone (EDARBYCLOR) 40-25 MG TABS Take 1 tablet by mouth daily. (Patient not taking: Reported on 05/06/2020) 30 tablet 0  . promethazine (PHENERGAN) 25 MG suppository Place 1 suppository (25 mg total) rectally every 6 (six) hours as needed for nausea or vomiting. (Patient not taking:  Reported on 06/24/2020) 12 each 0   No facility-administered medications prior to visit.    Allergies  Allergen Reactions  . Honey Shortness Of Breath  . Mango Flavor Hives and Swelling    Pt allergic to all mango - tongue and throat swelling  . Glipizide   . Victoza [Liraglutide] Nausea And Vomiting    Review of Systems     Objective:    Physical Exam  BP 130/81 (BP Location: Left Arm, Patient Position: Sitting, Cuff Size: Normal)   Pulse 72   Temp 97.9 F (36.6 C) (Temporal)   Ht 5\' 5"  (1.651 m)   Wt 279 lb (126.6 kg)   SpO2 95%   BMI 46.43 kg/m  Wt Readings from Last 3 Encounters:  06/24/20 279 lb (126.6 kg)  05/06/20 273 lb (123.8 kg)  02/11/20 273 lb 12.8 oz (124.2 kg)    Health Maintenance Due  Topic Date Due  . COVID-19 Vaccine (1) Never done  . COLONOSCOPY (Pts 45-7yrs Insurance coverage will need to be confirmed)  Never done  . HEMOGLOBIN A1C  05/28/2020    There are no preventive care reminders to display for this patient.   Lab Results  Component Value Date   TSH 2.780 02/11/2020   Lab Results  Component Value Date   WBC 10.3 02/11/2020   HGB 13.3 02/11/2020   HCT 40.4 02/11/2020   MCV 86 02/11/2020   PLT 301 02/11/2020   Lab Results  Component Value Date   NA 138 03/03/2020   K 4.2 03/03/2020   CO2 24 11/26/2019   GLUCOSE 249 (H) 03/03/2020   BUN 13 03/03/2020   CREATININE 0.71 03/03/2020   BILITOT 0.3 03/03/2020    ALKPHOS 95 03/03/2020   AST 22 03/03/2020   ALT 27 11/18/2019   PROT 6.1 03/03/2020   ALBUMIN 3.7 (L) 03/03/2020   CALCIUM 8.7 03/03/2020   ANIONGAP 11 11/26/2019   Lab Results  Component Value Date   CHOL 137 06/24/2020   Lab Results  Component Value Date   HDL 45 06/24/2020   Lab Results  Component Value Date   LDLCALC 70 06/24/2020   Lab Results  Component Value Date   TRIG 124 06/24/2020   Lab Results  Component Value Date   CHOLHDL 3.0 06/24/2020   Lab Results  Component Value Date   HGBA1C 9.7 (H) 11/26/2019       Assessment & Plan:   Problem List Items Addressed This Visit      Endocrine   Type 2 diabetes mellitus with hyperglycemia, with long-term current use of insulin (HCC)   Relevant Medications   olmesartan-hydrochlorothiazide (BENICAR HCT) 40-25 MG tablet    Other Visit Diagnoses    Urine frequency    -  Primary Empirical anbx treatment due to symptoms and history.  Urine culture pending Encourage completion of anbx even when symptoms improve.  Discussed resistance with anbx overuse Discussed allergic reactions with anbx.  Encourage increasing hydration with water and how to tell when this is achieved Add cranberry juice 100% 8-16 ozs daily until symptoms improve Discussed hygiene  Avoid not voiding when the urge presents    Relevant Orders   POCT Urinalysis Dipstick (Completed)   CBC with Differential/Platelet (Completed)   Essential hypertension Started Olmesartan/HCTZ 40/25 mg daily     Encouraged on going compliance with current medication regimen Encouraged home monitoring and recording BP <130/80 Eating a heart-healthy diet with less salt Encouraged regular physical activity  Recommend Weight loss  Relevant Medications   olmesartan-hydrochlorothiazide (BENICAR HCT) 40-25 MG tablet   Other Relevant Orders   Comp. 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 nitrofurantoin, macrocrystal-monohydrate, (MACROBID) 100 MG capsule    Sig: Take 1 capsule (100 mg total) by mouth 2 (two) times daily for 5 days.    Dispense:  10 capsule    Refill:  0    Order Specific Question:   Supervising Provider    Answer:   Tresa Garter [id number]  . esomeprazole (NEXIUM) 20 MG capsule    Sig: Take 1 capsule (20 mg total) by mouth at bedtime.    Dispense:  90 capsule    Refill:  3    Order Specific Question:   Supervising Provider    Answer:   Tresa Garter [id number]  . fluocinonide (LIDEX) 0.05 % external solution    Sig: Apply 1 application topically 2 (two) times daily.    Dispense:  60 mL    Refill:  0    Order Specific Question:   Supervising Provider    Answer:   Tresa Garter [id number]  . olmesartan-hydrochlorothiazide (BENICAR HCT) 40-25 MG tablet    Sig: Take 1 tablet by mouth daily.    Dispense:  90 tablet    Refill:  3    Order Specific Question:   Supervising Provider    Answer:   Tresa Garter [[id number]]     Vevelyn Francois, NP

## 2020-06-25 LAB — COMP. METABOLIC PANEL (12)
AST: 33 IU/L (ref 0–40)
Albumin/Globulin Ratio: 1.6 (ref 1.2–2.2)
Albumin: 4 g/dL (ref 3.8–4.8)
Alkaline Phosphatase: 104 IU/L (ref 44–121)
BUN/Creatinine Ratio: 17 (ref 9–23)
BUN: 11 mg/dL (ref 6–24)
Bilirubin Total: 0.4 mg/dL (ref 0.0–1.2)
Calcium: 9.1 mg/dL (ref 8.7–10.2)
Chloride: 99 mmol/L (ref 96–106)
Creatinine, Ser: 0.65 mg/dL (ref 0.57–1.00)
Globulin, Total: 2.5 g/dL (ref 1.5–4.5)
Glucose: 254 mg/dL — ABNORMAL HIGH (ref 65–99)
Potassium: 4.5 mmol/L (ref 3.5–5.2)
Sodium: 135 mmol/L (ref 134–144)
Total Protein: 6.5 g/dL (ref 6.0–8.5)
eGFR: 110 mL/min/{1.73_m2} (ref >59)

## 2020-06-25 LAB — CBC WITH DIFFERENTIAL/PLATELET
Basophils Absolute: 0.1 10*3/uL (ref 0.0–0.2)
Basos: 1 %
EOS (ABSOLUTE): 0.2 10*3/uL (ref 0.0–0.4)
Eos: 2 %
Hematocrit: 39.2 % (ref 34.0–46.6)
Hemoglobin: 12.9 g/dL (ref 11.1–15.9)
Immature Grans (Abs): 0 10*3/uL (ref 0.0–0.1)
Immature Granulocytes: 0 %
Lymphocytes Absolute: 3.2 10*3/uL — ABNORMAL HIGH (ref 0.7–3.1)
Lymphs: 35 %
MCH: 28 pg (ref 26.6–33.0)
MCHC: 32.9 g/dL (ref 31.5–35.7)
MCV: 85 fL (ref 79–97)
Monocytes Absolute: 0.5 10*3/uL (ref 0.1–0.9)
Monocytes: 5 %
Neutrophils Absolute: 5.2 10*3/uL (ref 1.4–7.0)
Neutrophils: 57 %
Platelets: 267 10*3/uL (ref 150–450)
RBC: 4.61 x10E6/uL (ref 3.77–5.28)
RDW: 14 % (ref 11.7–15.4)
WBC: 9.1 10*3/uL (ref 3.4–10.8)

## 2020-06-25 LAB — LIPID PANEL
Chol/HDL Ratio: 3 ratio (ref 0.0–4.4)
Cholesterol, Total: 137 mg/dL (ref 100–199)
HDL: 45 mg/dL (ref >39)
LDL Chol Calc (NIH): 70 mg/dL (ref 0–99)
Triglycerides: 124 mg/dL (ref 0–149)
VLDL Cholesterol Cal: 22 mg/dL (ref 5–40)

## 2020-06-27 ENCOUNTER — Encounter: Payer: Self-pay | Admitting: Nurse Practitioner

## 2020-07-04 ENCOUNTER — Ambulatory Visit: Payer: Medicaid Other | Admitting: Internal Medicine

## 2020-07-12 ENCOUNTER — Other Ambulatory Visit: Payer: Self-pay | Admitting: Nurse Practitioner

## 2020-07-12 DIAGNOSIS — M545 Low back pain, unspecified: Secondary | ICD-10-CM

## 2020-07-12 DIAGNOSIS — E1169 Type 2 diabetes mellitus with other specified complication: Secondary | ICD-10-CM

## 2020-07-12 DIAGNOSIS — Z794 Long term (current) use of insulin: Secondary | ICD-10-CM

## 2020-08-03 ENCOUNTER — Other Ambulatory Visit: Payer: Self-pay | Admitting: Nurse Practitioner

## 2020-08-03 DIAGNOSIS — M545 Low back pain, unspecified: Secondary | ICD-10-CM

## 2020-08-24 ENCOUNTER — Other Ambulatory Visit: Payer: Self-pay | Admitting: Nurse Practitioner

## 2020-08-24 DIAGNOSIS — R2242 Localized swelling, mass and lump, left lower limb: Secondary | ICD-10-CM

## 2020-08-24 NOTE — Progress Notes (Signed)
   [address] [place], [place]  [address] Phone:  [phone]   Fax:  [phone]  Assessment  Primary Diagnosis & Pertinent Problem List: The encounter diagnosis was Nodule of skin of left foot.  Visit Diagnosis: 1. Nodule of skin of left foot  Xray of left foot

## 2020-08-25 ENCOUNTER — Ambulatory Visit (HOSPITAL_COMMUNITY)
Admission: RE | Admit: 2020-08-25 | Discharge: 2020-08-25 | Disposition: A | Discharge status: Home / Self Care | Payer: 59 | Source: Ambulatory Visit | Attending: Nurse Practitioner | Admitting: Nurse Practitioner

## 2020-08-25 ENCOUNTER — Other Ambulatory Visit: Payer: Self-pay

## 2020-08-25 DIAGNOSIS — R2242 Localized swelling, mass and lump, left lower limb: Secondary | ICD-10-CM | POA: Insufficient documentation

## 2020-08-29 ENCOUNTER — Other Ambulatory Visit: Payer: Self-pay | Admitting: Nurse Practitioner

## 2020-08-29 DIAGNOSIS — M545 Low back pain, unspecified: Secondary | ICD-10-CM

## 2020-09-10 ENCOUNTER — Other Ambulatory Visit: Payer: Self-pay | Admitting: Nurse Practitioner

## 2020-09-12 ENCOUNTER — Other Ambulatory Visit: Payer: Self-pay | Admitting: Nurse Practitioner

## 2020-09-12 DIAGNOSIS — J4520 Mild intermittent asthma, uncomplicated: Secondary | ICD-10-CM

## 2020-09-12 MED ORDER — ALBUTEROL SULFATE (2.5 MG/3ML) 0.083% IN NEBU: 2.5000 mg | INHALATION_SOLUTION | Freq: Four times a day (QID) | RESPIRATORY_TRACT | 6 refills | Status: DC | PRN

## 2020-09-12 MED ORDER — NOVOLOG FLEXPEN 100 UNIT/ML ~~LOC~~ SOPN: 10.0000 [IU] | PEN_INJECTOR | Freq: Three times a day (TID) | SUBCUTANEOUS | 5 refills | Status: DC

## 2020-09-13 ENCOUNTER — Other Ambulatory Visit: Payer: Self-pay | Admitting: Nurse Practitioner

## 2020-09-13 MED ORDER — ALBUTEROL SULFATE HFA 108 (90 BASE) MCG/ACT IN AERS: INHALATION_SPRAY | RESPIRATORY_TRACT | 0 refills | Status: DC

## 2020-09-14 ENCOUNTER — Other Ambulatory Visit: Payer: Self-pay | Admitting: Nurse Practitioner

## 2020-09-26 ENCOUNTER — Encounter: Payer: Self-pay | Admitting: Nurse Practitioner

## 2020-09-26 ENCOUNTER — Telehealth (INDEPENDENT_AMBULATORY_CARE_PROVIDER_SITE_OTHER): Payer: 59 | Admitting: Nurse Practitioner

## 2020-09-26 ENCOUNTER — Other Ambulatory Visit: Payer: Self-pay

## 2020-09-26 ENCOUNTER — Ambulatory Visit: Payer: 59 | Admitting: Nurse Practitioner

## 2020-09-26 DIAGNOSIS — E1165 Type 2 diabetes mellitus with hyperglycemia: Secondary | ICD-10-CM | POA: Diagnosis not present

## 2020-09-26 DIAGNOSIS — E1169 Type 2 diabetes mellitus with other specified complication: Secondary | ICD-10-CM | POA: Diagnosis not present

## 2020-09-26 DIAGNOSIS — M79672 Pain in left foot: Secondary | ICD-10-CM

## 2020-09-26 DIAGNOSIS — I10 Essential (primary) hypertension: Secondary | ICD-10-CM

## 2020-09-26 DIAGNOSIS — Z794 Long term (current) use of insulin: Secondary | ICD-10-CM | POA: Diagnosis not present

## 2020-09-26 DIAGNOSIS — H9190 Unspecified hearing loss, unspecified ear: Secondary | ICD-10-CM | POA: Diagnosis not present

## 2020-09-26 DIAGNOSIS — E782 Mixed hyperlipidemia: Secondary | ICD-10-CM

## 2020-09-26 LAB — POCT GLYCOSYLATED HEMOGLOBIN (HGB A1C)
HbA1c POC (<> result, manual entry): 9.7 % (ref 4.0–5.6)
HbA1c, POC (controlled diabetic range): 9.7 % — AB (ref 0.0–7.0)
HbA1c, POC (prediabetic range): 9.7 % — AB (ref 5.7–6.4)
Hemoglobin A1C: 9.7 % — AB (ref 4.0–5.6)

## 2020-09-26 LAB — GLUCOSE, POCT (MANUAL RESULT ENTRY): POC Glucose: 347 mg/dl — AB (ref 70–99)

## 2020-09-26 NOTE — Progress Notes (Signed)
Patient came in for labs, CBG and A1C

## 2020-09-26 NOTE — Progress Notes (Signed)
   [place], [place]  [address] Phone:  [phone]   Fax:  [phone] Virtual Visit via Video Note  I connected with Lori Shea on 09/26/20 at 10:40 AM EDT by video and verified that I am speaking with the correct person using two identifiers.   I discussed the limitations, risks, security and privacy concerns of performing an evaluation and management service by video and the availability of in person appointments. I also discussed with the patient that there may be a patient responsible charge related to this service. The patient expressed understanding and agreed to proceed.  Patient home Provider Office  History of Present Illness:  She continues to have left foot. She reports that the pink toe is painful. Xray was negative. She is unable to wear specific shoes due to irritation . She denies any redness or swelling. She report with standing for long periods and walking causes pain and burning. She does not see any changes in the toe even with pain. She has ordered a protective padding which effective with some extra cushion or support. She has uncontrolled DM.   Patient for follow up of diabetes. Home sugars: 125 and 150 am. She reports that she has been treated with prednisone for on and off URI symptoms. Highest CBG seen with the steroid was 288. She is not checking her CBG during the day. However She is using Novolog 13-14 units with each meal this depends on the meal. She is on Lantus 40 units BID. Last dilated eye exam: 2021.   She has noticed that her hearing has changed. She would like to have a hearing test if possible.   Observations/Objective: Virtual visit no exam by me will report to the office at 2 pm for labs and hearing test.   Assessment and Plan: Assessment  Primary Diagnosis & Pertinent Problem List: The primary encounter diagnosis was Type 2 diabetes mellitus with hyperglycemia, with long-term current use of  insulin ([place]). 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  Visit Diagnosis: 1. Type 2 diabetes mellitus with hyperglycemia, with long-term current use of insulin ([facility])  A1c to be completed we will continue with current regimen for now Discussion with patient on the importance of in your maintenance related to diabetes care  2. Essential hypertension  Stable continue with current regimen; CMP pending  3. 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 Perceived hearing changes  Hearing test to be completed  5. Left foot pain  Podiatry referral for further evaluation of the left fifth digit pain and burning Overall foot surveillance due to uncontrolled diabetes     Follow Up Instructions: 3 mon.   I discussed the assessment and treatment plan with the patient. The patient was provided an opportunity to ask questions and all were answered. The patient agreed with the plan and demonstrated an understanding of the instructions.   The patient was advised to call back or seek an in-person evaluation if the symptoms worsen or if the condition fails to improve as anticipated.  I provided 22 minutes of video- visit time during this encounter.   Vevelyn Francois, NP

## 2020-09-27 ENCOUNTER — Ambulatory Visit (INDEPENDENT_AMBULATORY_CARE_PROVIDER_SITE_OTHER): Payer: 59 | Admitting: Podiatry

## 2020-09-27 ENCOUNTER — Ambulatory Visit (INDEPENDENT_AMBULATORY_CARE_PROVIDER_SITE_OTHER): Payer: 59

## 2020-09-27 ENCOUNTER — Encounter: Payer: Self-pay | Admitting: Podiatry

## 2020-09-27 ENCOUNTER — Other Ambulatory Visit: Payer: Self-pay

## 2020-09-27 DIAGNOSIS — M7752 Other enthesopathy of left foot: Secondary | ICD-10-CM

## 2020-09-27 DIAGNOSIS — M79672 Pain in left foot: Secondary | ICD-10-CM | POA: Diagnosis not present

## 2020-09-27 DIAGNOSIS — M21622 Bunionette of left foot: Secondary | ICD-10-CM

## 2020-09-27 LAB — COMP. METABOLIC PANEL (12)
AST: 30 IU/L (ref 0–40)
Albumin/Globulin Ratio: 1.5 (ref 1.2–2.2)
Albumin: 4 g/dL (ref 3.8–4.8)
Alkaline Phosphatase: 112 IU/L (ref 44–121)
BUN/Creatinine Ratio: 15 (ref 9–23)
BUN: 13 mg/dL (ref 6–24)
Bilirubin Total: 0.4 mg/dL (ref 0.0–1.2)
Calcium: 9 mg/dL (ref 8.7–10.2)
Chloride: 99 mmol/L (ref 96–106)
Creatinine, Ser: 0.89 mg/dL (ref 0.57–1.00)
Globulin, Total: 2.7 g/dL (ref 1.5–4.5)
Glucose: 337 mg/dL — ABNORMAL HIGH (ref 65–99)
Potassium: 4.1 mmol/L (ref 3.5–5.2)
Sodium: 138 mmol/L (ref 134–144)
Total Protein: 6.7 g/dL (ref 6.0–8.5)
eGFR: 81 mL/min/{1.73_m2} (ref >59)

## 2020-09-27 LAB — LIPID PANEL
Chol/HDL Ratio: 3.6 ratio (ref 0.0–4.4)
Cholesterol, Total: 146 mg/dL (ref 100–199)
HDL: 41 mg/dL (ref >39)
LDL Chol Calc (NIH): 67 mg/dL (ref 0–99)
Triglycerides: 230 mg/dL — ABNORMAL HIGH (ref 0–149)
VLDL Cholesterol Cal: 38 mg/dL (ref 5–40)

## 2020-09-27 NOTE — Patient Instructions (Signed)
Diabetes Mellitus and Foot Care Foot care is an important part of your health, especially when you have diabetes. Diabetes may cause you to have problems because of poor blood flow (circulation) to your feet and legs, which can cause your skin to:  Become thinner and drier.  Break more easily.  Heal more slowly.  Peel and crack. You may also have nerve damage (neuropathy) in your legs and feet, causing decreased feeling in them. This means that you may not notice minor injuries to your feet that could lead to more serious problems. Noticing and addressing any potential problems early is the best way to prevent future foot problems. How to care for your feet Foot hygiene  Wash your feet daily with warm water and mild soap. Do not use hot water. Then, pat your feet and the areas between your toes until they are completely dry. Do not soak your feet as this can dry your skin.  Trim your toenails straight across. Do not dig under them or around the cuticle. File the edges of your nails with an emery board or nail file.  Apply a moisturizing lotion or petroleum jelly to the skin on your feet and to dry, brittle toenails. Use lotion that does not contain alcohol and is unscented. Do not apply lotion between your toes.   Shoes and socks  Wear clean socks or stockings every day. Make sure they are not too tight. Do not wear knee-high stockings since they may decrease blood flow to your legs.  Wear shoes that fit properly and have enough cushioning. Always look in your shoes before you put them on to be sure there are no objects inside.  To break in new shoes, wear them for just a few hours a day. This prevents injuries on your feet. Wounds, scrapes, corns, and calluses  Check your feet daily for blisters, cuts, bruises, sores, and redness. If you cannot see the bottom of your feet, use a mirror or ask someone for help.  Do not cut corns or calluses or try to remove them with medicine.  If you  find a minor scrape, cut, or break in the skin on your feet, keep it and the skin around it clean and dry. You may clean these areas with mild soap and water. Do not clean the area with peroxide, alcohol, or iodine.  If you have a wound, scrape, corn, or callus on your foot, look at it several times a day to make sure it is healing and not infected. Check for: ? Redness, swelling, or pain. ? Fluid or blood. ? Warmth. ? Pus or a bad smell.   General tips  Do not cross your legs. This may decrease blood flow to your feet.  Do not use heating pads or hot water bottles on your feet. They may burn your skin. If you have lost feeling in your feet or legs, you may not know this is happening until it is too late.  Protect your feet from hot and cold by wearing shoes, such as at the beach or on hot pavement.  Schedule a complete foot exam at least once a year (annually) or more often if you have foot problems. Report any cuts, sores, or bruises to your health care provider immediately. Where to find more information  American Diabetes Association: www.diabetes.org  Association of Diabetes Care & Education Specialists: www.diabeteseducator.org Contact a health care provider if:  You have a medical condition that increases your risk of infection and   you have any cuts, sores, or bruises on your feet.  You have an injury that is not healing.  You have redness on your legs or feet.  You feel burning or tingling in your legs or feet.  You have pain or cramps in your legs and feet.  Your legs or feet are numb.  Your feet always feel cold.  You have pain around any toenails. Get help right away if:  You have a wound, scrape, corn, or callus on your foot and: ? You have pain, swelling, or redness that gets worse. ? You have fluid or blood coming from the wound, scrape, corn, or callus. ? Your wound, scrape, corn, or callus feels warm to the touch. ? You have pus or a bad smell coming from  the wound, scrape, corn, or callus. ? You have a fever. ? You have a red line going up your leg. Summary  Check your feet every day for blisters, cuts, bruises, sores, and redness.  Apply a moisturizing lotion or petroleum jelly to the skin on your feet and to dry, brittle toenails.  Wear shoes that fit properly and have enough cushioning.  If you have foot problems, report any cuts, sores, or bruises to your health care provider immediately.  Schedule a complete foot exam at least once a year (annually) or more often if you have foot problems. This information is not intended to replace advice given to you by your health care provider. Make sure you discuss any questions you have with your health care provider. Document Revised: 10/29/2019 Document Reviewed: 10/29/2019 Elsevier Patient Education  2021 Elsevier Inc.  

## 2020-09-27 NOTE — Progress Notes (Signed)
Subjective:   Patient ID: Lori Shea, female   DOB: 45 y.o.   MRN: [id number]   HPI 45 year old female presents the office with concerns of left fifth toe pain.  She states that it hurts with shoes.  She said that she may have hit it but not sure.  She works at [employer] on her feet during the day.  She points to the lateral aspect area tailor's bunion where she has majority discomfort.  Last BS she reports was 347 yesterday, A1c 9.3 yesterday. Just finished oral steroids for "infection".   No other concerns today.  Review of Systems  All other systems reviewed and are negative.    Past Medical History:  Diagnosis Date   Allergy    Foods:  mango, honey, corn.  Allergic to "everything tested"  15 years ago in [place]   Anemia    with pregnancy   Arthritis    Shoulder and hips   Asthma age 63 yo   Triggers:  cold weather, exercise, allergens, anxiety, URIs   Cholelithiasis age 24 yo   Asymptomatic   COVID-19    Diabetes mellitus 2012   Was diagnosed 2 years earlier with PCOS   Elevated liver enzymes 09/13/2016   GERD (gastroesophageal reflux disease)    Heart murmur    with pregnancy   History of kidney stones    Hypertension 2015   Kidney stones    family history of renal failure   Migraines 2009   Morbid obesity ([place])    PCOS (polycystic ovarian syndrome) 2010   Pneumonia     Past Surgical History:  Procedure Laterality Date   CHOLECYSTECTOMY N/A 12/01/2019   Procedure: LAPAROSCOPIC CHOLECYSTECTOMY;  Surgeon: Armandina Gemma, MD;  Location: [facility];  Service: General;  Laterality: N/A;   COLPOSCOPY N/A    LAPAROSCOPIC TUBAL LIGATION Bilateral 01/28/2019   Procedure: LAPAROSCOPIC TUBAL LIGATION AND PAP SMEAR;  Surgeon: Aletha Halim, MD;  Location: [facility];  Service: Gynecology;  Laterality: Bilateral;   TONSILLECTOMY AND ADENOIDECTOMY Bilateral 12/23/2018   Procedure: TONSILLECTOMY AND ADENOIDECTOMY;  Surgeon: Leta Baptist, MD;  Location: [facility];  Service: ENT;  Laterality: Bilateral;     Current Outpatient Medications:    albuterol (PROVENTIL) (2.5 MG/3ML) 0.083% nebulizer solution, Take 3 mLs (2.5 mg total) by nebulization every 6 (six) hours as needed for wheezing or shortness of breath., Disp: 75 mL, Rfl: 6   albuterol (VENTOLIN HFA) 108 (90 Base) MCG/ACT inhaler, INHALE 2 PUFFS INTO THE LUNGS EVERY 4 HOURS AS NEEDED FOR WHEEZING OR SHORTNESS OF BREATH., Disp: 1 each, Rfl: 0   amitriptyline (ELAVIL) 25 MG tablet, TAKE 1 TABLET BY MOUTH NIGHTLY AT BEDTIME, Disp: 30 tablet, Rfl: 5   Azilsartan-Chlorthalidone (EDARBYCLOR) 40-25 MG TABS, Take 1 tablet by mouth daily., Disp: 30 tablet, Rfl: 0   cetirizine (ZYRTEC) 10 MG tablet, Take 1 tablet (10 mg total) by mouth daily., Disp: 30 tablet, Rfl: 11   cyclobenzaprine (FLEXERIL) 5 MG tablet, Take 1 tablet (5 mg total) by mouth 3 (three) times daily as needed for muscle spasms., Disp: 10 tablet, Rfl: 0   EASY COMFORT PEN NEEDLES 32G X 4 MM MISC, use as directed test 2 times daily, Disp: , Rfl: 3   esomeprazole (NEXIUM) 20 MG capsule, Take 1 capsule (20 mg total) by mouth at bedtime., Disp: 90 capsule, Rfl: 3   fluocinonide (LIDEX) 0.05 % external solution, Apply 1 application topically 2 (two) times daily., Disp: 60 mL,  Rfl: 0   fluticasone (FLONASE) 50 MCG/ACT nasal spray, Place 2 sprays into both nostrils daily., Disp: 16 g, Rfl: 6   Fluticasone-Salmeterol (ADVAIR DISKUS) 100-50 MCG/DOSE AEPB, INHALE 1 PUFF INTO THE LUNGS EVERY 12 HOURS., Disp: 1 each, Rfl: 0   ibuprofen (ADVIL) 800 MG tablet, Take 1 tablet (800 mg total) by mouth 3 (three) times daily., Disp: 21 tablet, Rfl: 0   insulin aspart (NOVOLOG FLEXPEN) 100 UNIT/ML FlexPen, Inject 10-14 Units into the skin 3 (three) times daily before meals., Disp: 30 mL, Rfl: 5   insulin glargine (LANTUS SOLOSTAR) 100 UNIT/ML Solostar Pen, Inject 50 Units into the skin 2 (two) times daily. (Patient taking differently: Inject 40 Units  into the skin 2 (two) times daily.), Disp: 10 pen, Rfl: 11   Insulin Pen Needle (PEN NEEDLES) 32G X 5 MM MISC, 100 each by Does not apply route 2 (two) times daily., Disp: 100 each, Rfl: 3   MAGNESIUM CITRATE PO, Take 1 capsule by mouth every morning. , Disp: , Rfl:    meloxicam (MOBIC) 15 MG tablet, TAKE 1 TABLET BY MOUTH EVERY DAY, Disp: 30 tablet, Rfl: 3   metFORMIN (GLUCOPHAGE) 500 MG tablet, TAKE 1 TABLET BY MOUTH 2 TIMES DAILY WITH A MEAL, Disp: 60 tablet, Rfl: 5   montelukast (SINGULAIR) 10 MG tablet, Take 1 tablet (10 mg total) by mouth daily., Disp: 90 tablet, Rfl: 3   olmesartan-hydrochlorothiazide (BENICAR HCT) 40-25 MG tablet, Take 1 tablet by mouth daily., Disp: 90 tablet, Rfl: 3   rizatriptan (MAXALT-MLT) 10 MG disintegrating tablet, Take 1 tablet (10 mg total) by mouth as needed for migraine. May repeat in 2 hours if needed, Disp: 12 tablet, Rfl: 0   simvastatin (ZOCOR) 5 MG tablet, TAKE 1 TABLET BY MOUTH NIGHTLY AT BEDTIME, Disp: 90 tablet, Rfl: 2  Allergies  Allergen Reactions   Honey Shortness Of Breath   Mango Flavor Hives and Swelling    Pt allergic to all mango - tongue and throat swelling   Glipizide     Other reaction(s): Other   Liraglutide Nausea And Vomiting         Objective:  Physical Exam  General: AAO x3, NAD  Dermatological: Skin is warm, dry and supple bilateral.  There are no open sores, no preulcerative lesions, no rash or signs of infection present.  Vascular: Dorsalis Pedis artery and Posterior Tibial artery pedal pulses are 2/4 bilateral with immedate capillary fill time. There is no pain with calf compression, swelling, warmth, erythema.   Neruologic: Grossly intact via light touch bilateral.   Musculoskeletal: Tenderness on the fifth MPJ laterally on the area tailor's bunion the left foot.  Mild erythema 4 extremities as she is not there is no skin breakdown or warmth.  No fluctuation.  Adductovarus present fifth toe.  Hammertoes are noted  otherwise.  No other areas of discomfort identified today.  Gait: Unassisted, Nonantalgic.       Assessment:   45 year old female tailors bunion, capsulitis left foot     Plan:  -Treatment options discussed including all alternatives, risks, and complications -X-rays were obtained and reviewed with the patient.  Hammertoes present.  No evidence of acute fracture. -We discussed steroid injection when her sugars have been high so would hold off on this.  Discussed Voltaren gel that she can use topically.  Offloading pads we discussed shoe modifications. -Daily foot inspection.  Trula Slade DPM

## 2020-10-03 IMAGING — CT CT ABD-PELV W/ CM
2 of 5 series · 16 of 46 positions shown, 18 images · IV contrast (Omni 300)
Comparison: CT Abdomen and Pelvis 11/23/2017 and earlier.

CLINICAL DATA: 43-year-old female with acute abdominal pain.
Laparoscopic tubal ligation earlier this week.

EXAM:
CT ABDOMEN AND PELVIS WITH CONTRAST
TECHNIQUE: Multidetector CT imaging of the abdomen and pelvis was performed
using the standard protocol following bolus administration of
intravenous contrast.
CONTRAST:  100mL OMNIPAQUE IOHEXOL 300 MG/ML  SOLN

[Series 3: a/p w/ 5mm · axial · 0.98mm/px · z∈[-999,-494]mm · 13 of 113 slices shown, 15 images]
[im 6/113  soft-tissue]
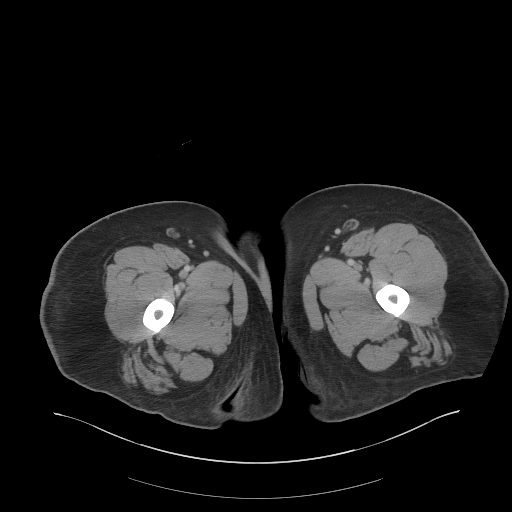
[im 6/113  bone]
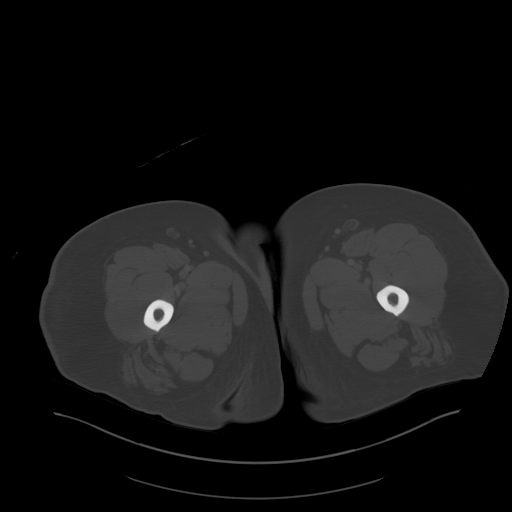
[im 18/113  soft-tissue]
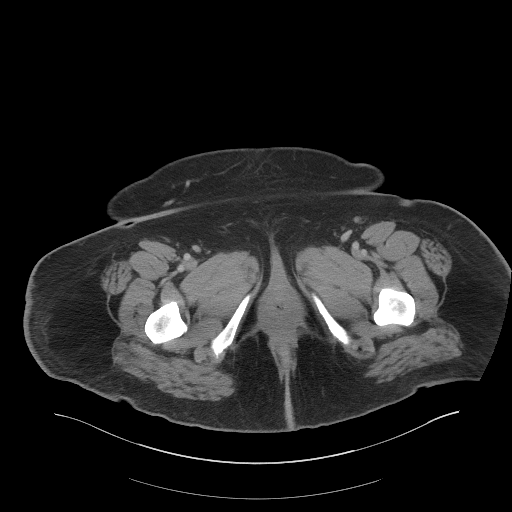
[im 24/113  soft-tissue]
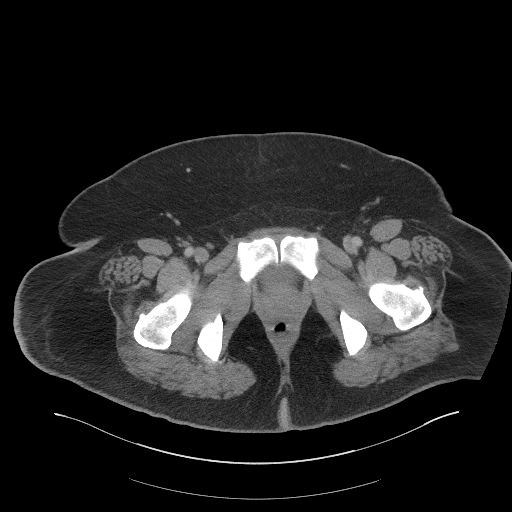
[im 30/113  soft-tissue]
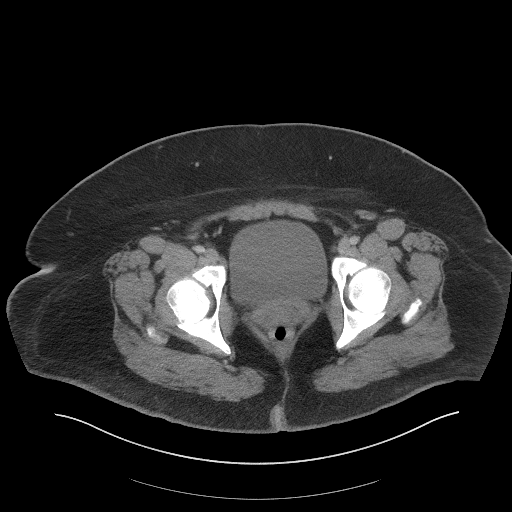
[im 42/113  soft-tissue]
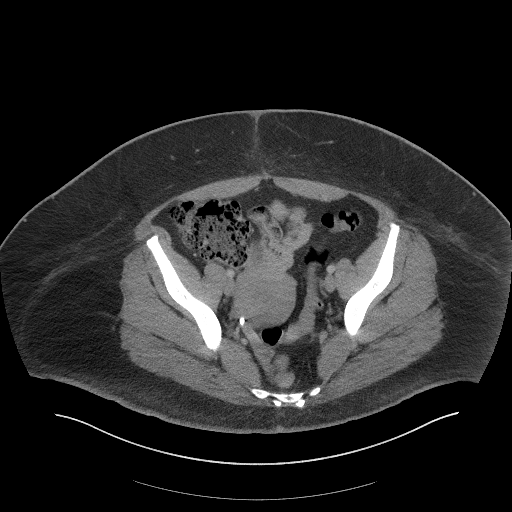
[im 48/113  soft-tissue]
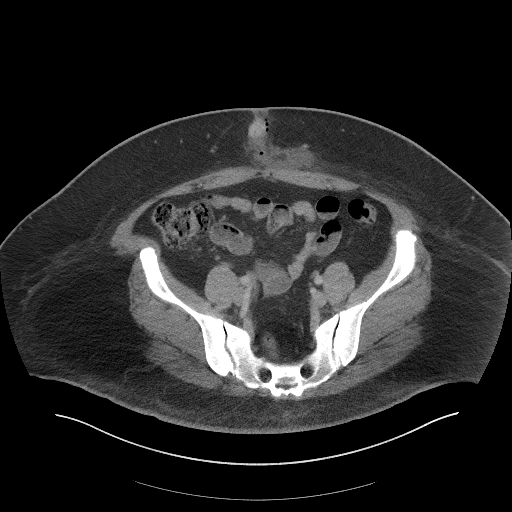
[im 59/113  soft-tissue]
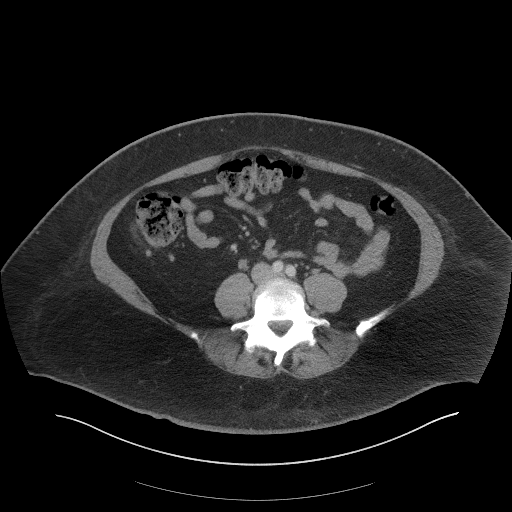
[im 65/113  soft-tissue]
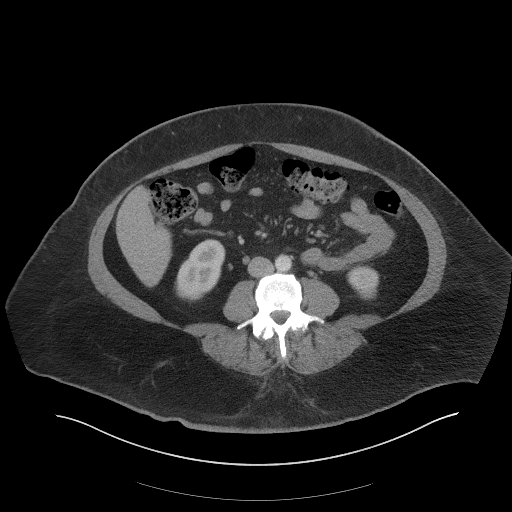
[im 71/113  soft-tissue]
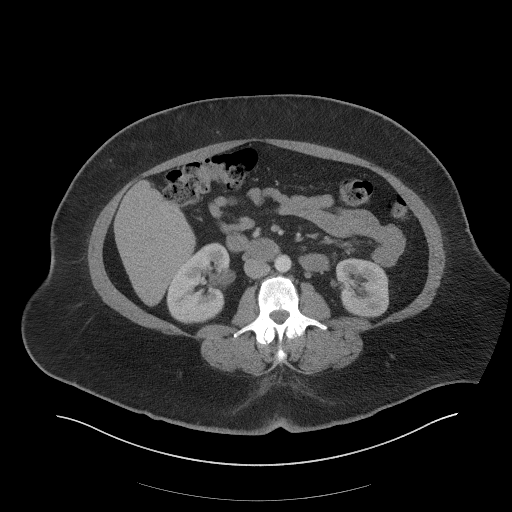
[im 71/113  bone]
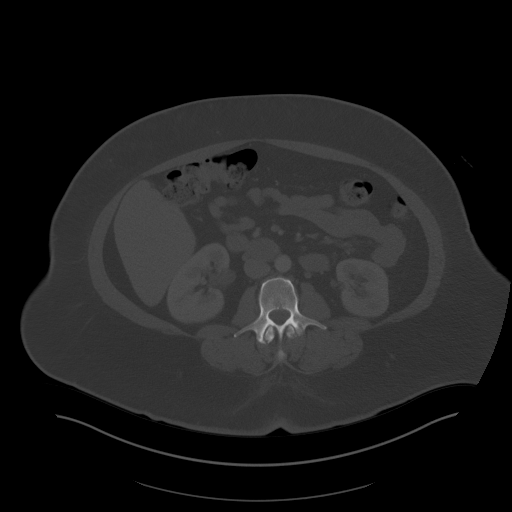
[im 83/113  soft-tissue]
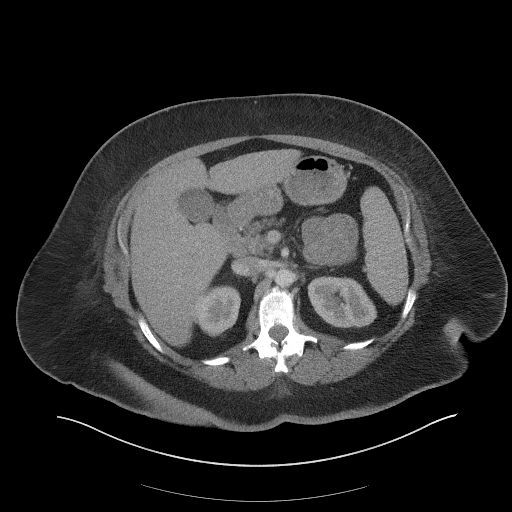
[im 89/113  soft-tissue]
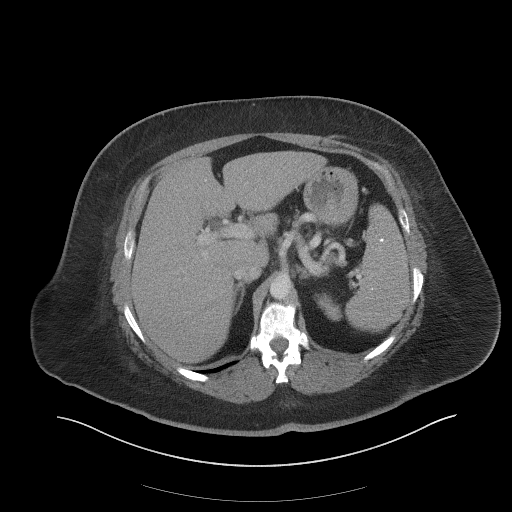
[im 95/113  soft-tissue]
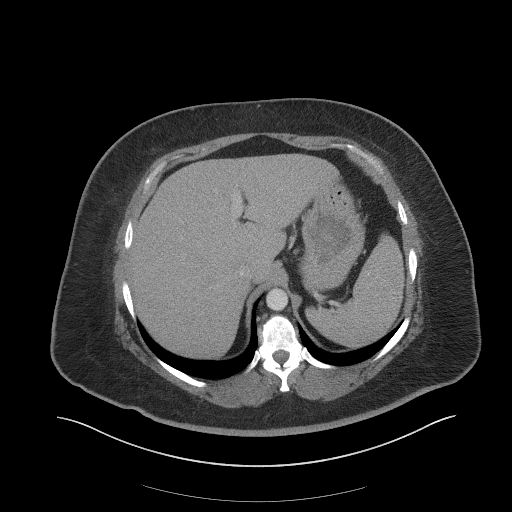
[im 107/113  soft-tissue]
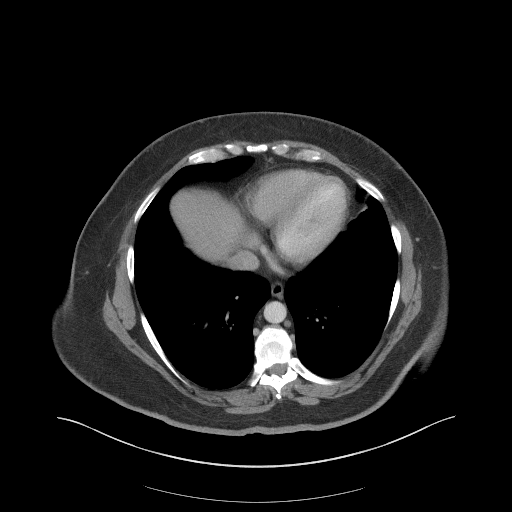

[Series 6: a/p w/ cor · coronal · 1.09mm/px · 3 of 187 slices shown]
[im 63/187  soft-tissue]
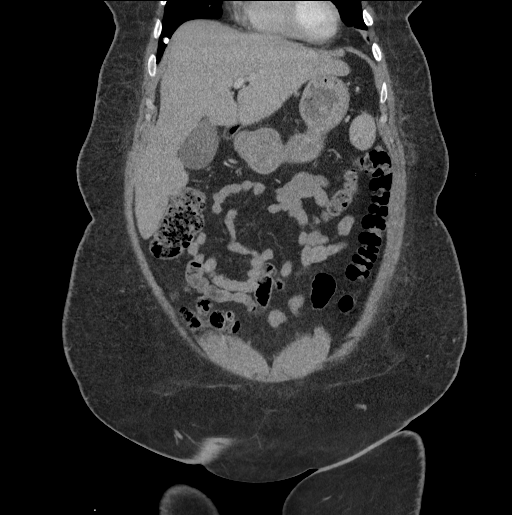
[im 83/187  soft-tissue]
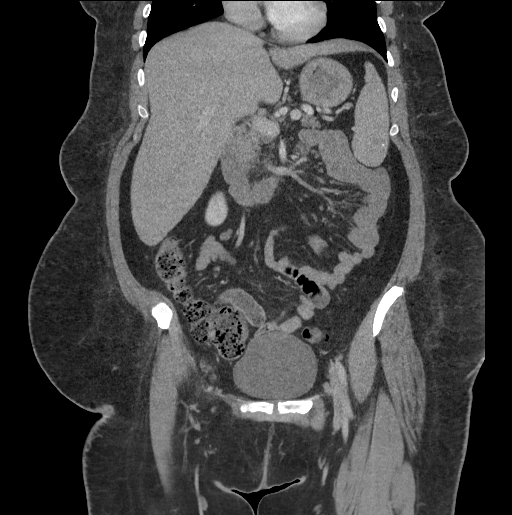
[im 104/187  soft-tissue]
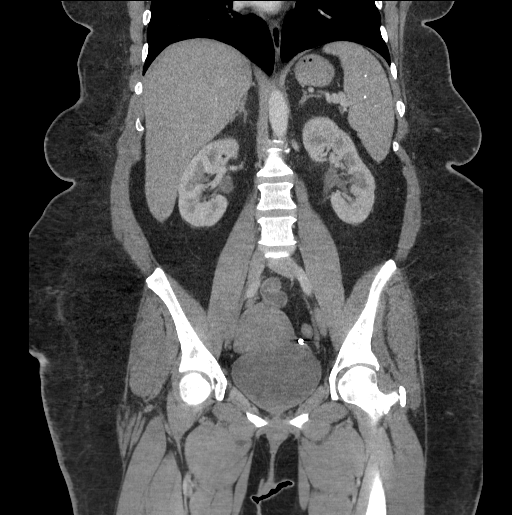

[16 of 46 positions shown; findings below may reference images not displayed]

FINDINGS: Lower chest: Small calcified granuloma in the anterior right
costophrenic angle is unchanged. Otherwise negative. No pericardial
or pleural effusion.

Hepatobiliary: Negative liver and gallbladder.

Pancreas: Partially atrophied, stable and otherwise negative.

Spleen: Multiple small calcified splenic granulomas are stable,
otherwise negative.

Adrenals/Urinary Tract: Normal adrenal glands.

Symmetric bilateral renal enhancement with little contrast excretion
on the delayed phase images. Ureters appear within normal limits.
Stable left hemipelvis phlebolith. No perinephric or periureteral
stranding. Unremarkable urinary bladder.

Stomach/Bowel: Negative distal large bowel. Mild retained stool in
the transverse and right colon. Normal retrocecal appendix on series
3, image 59. Negative terminal ileum. No dilated small bowel.
Negative stomach and duodenum. No free air. No free fluid.

Vascular/Lymphatic: The major arterial structures are patent and
appear normal. Portal venous system appears patent. No
lymphadenopathy.

Reproductive: Bilateral tubal ligation clips are new (series 3,
image 75). Otherwise within normal limits.

Other: No pelvic free fluid.

6 -7 centimeter area of subcutaneous stranding and trace gas in the
ventral abdomen at the umbilicus and tracking to the left (series 3,
image 66). No discrete or drainable fluid collection. Otherwise
stable and negative abdominal wall.

Musculoskeletal: Lumbar spine degeneration. No acute osseous
abnormality identified.
IMPRESSION: 1. Ventral abdominal wall subcutaneous stranding with trace gas
compatible with recent surgery. No abscess or drainable fluid
collection.
2. Bilateral tubal ligation clips in place with no adverse features.
3. Little renal contrast excretion on the delayed images raising the
possibility of acute renal insufficiency.
But otherwise negative kidneys, ureters, and urinary bladder.
4. Otherwise negative CT appearance of the abdomen and pelvis.

## 2020-10-04 ENCOUNTER — Other Ambulatory Visit: Payer: Self-pay | Admitting: Nurse Practitioner

## 2020-10-04 MED ORDER — FLUTICASONE-SALMETEROL 100-50 MCG/ACT IN AEPB: 1.0000 | INHALATION_SPRAY | Freq: Two times a day (BID) | RESPIRATORY_TRACT | 11 refills | Status: DC

## 2020-10-04 NOTE — Telephone Encounter (Signed)
Error

## 2020-10-13 ENCOUNTER — Other Ambulatory Visit: Payer: Self-pay | Admitting: Nurse Practitioner

## 2020-10-13 DIAGNOSIS — Z794 Long term (current) use of insulin: Secondary | ICD-10-CM

## 2020-10-13 DIAGNOSIS — E1165 Type 2 diabetes mellitus with hyperglycemia: Secondary | ICD-10-CM

## 2020-10-14 ENCOUNTER — Other Ambulatory Visit: Payer: Self-pay | Admitting: Nurse Practitioner

## 2020-10-14 DIAGNOSIS — Z794 Long term (current) use of insulin: Secondary | ICD-10-CM

## 2020-10-14 DIAGNOSIS — G4700 Insomnia, unspecified: Secondary | ICD-10-CM

## 2020-10-14 DIAGNOSIS — M545 Low back pain, unspecified: Secondary | ICD-10-CM

## 2020-10-14 DIAGNOSIS — J4530 Mild persistent asthma, uncomplicated: Secondary | ICD-10-CM

## 2020-12-09 ENCOUNTER — Encounter: Payer: Self-pay | Admitting: Nurse Practitioner

## 2020-12-28 ENCOUNTER — Encounter: Payer: Self-pay | Admitting: Nurse Practitioner

## 2020-12-28 ENCOUNTER — Ambulatory Visit (INDEPENDENT_AMBULATORY_CARE_PROVIDER_SITE_OTHER): Payer: 59 | Admitting: Nurse Practitioner

## 2020-12-28 ENCOUNTER — Other Ambulatory Visit: Payer: Self-pay

## 2020-12-28 VITALS — BP 144/88 | HR 92 | Temp 98.0°F | Ht 65.0 in | Wt 284.2 lb

## 2020-12-28 DIAGNOSIS — M545 Low back pain, unspecified: Secondary | ICD-10-CM

## 2020-12-28 DIAGNOSIS — H9203 Otalgia, bilateral: Secondary | ICD-10-CM | POA: Diagnosis not present

## 2020-12-28 DIAGNOSIS — E1165 Type 2 diabetes mellitus with hyperglycemia: Secondary | ICD-10-CM

## 2020-12-28 DIAGNOSIS — Z794 Long term (current) use of insulin: Secondary | ICD-10-CM

## 2020-12-28 DIAGNOSIS — J4520 Mild intermittent asthma, uncomplicated: Secondary | ICD-10-CM

## 2020-12-28 DIAGNOSIS — E1169 Type 2 diabetes mellitus with other specified complication: Secondary | ICD-10-CM

## 2020-12-28 DIAGNOSIS — J4530 Mild persistent asthma, uncomplicated: Secondary | ICD-10-CM

## 2020-12-28 DIAGNOSIS — I10 Essential (primary) hypertension: Secondary | ICD-10-CM | POA: Diagnosis not present

## 2020-12-28 DIAGNOSIS — E66813 Obesity, class 3: Secondary | ICD-10-CM

## 2020-12-28 DIAGNOSIS — G4700 Insomnia, unspecified: Secondary | ICD-10-CM

## 2020-12-28 DIAGNOSIS — E782 Mixed hyperlipidemia: Secondary | ICD-10-CM

## 2020-12-28 DIAGNOSIS — E119 Type 2 diabetes mellitus without complications: Secondary | ICD-10-CM

## 2020-12-28 LAB — POCT URINALYSIS DIP (CLINITEK)
Bilirubin, UA: NEGATIVE
Blood, UA: NEGATIVE
Glucose, UA: NEGATIVE mg/dL
Ketones, POC UA: NEGATIVE mg/dL
Leukocytes, UA: NEGATIVE
Nitrite, UA: NEGATIVE
POC PROTEIN,UA: NEGATIVE
Spec Grav, UA: 1.02 (ref 1.010–1.025)
Urobilinogen, UA: 0.2 E.U./dL
pH, UA: 5.5 (ref 5.0–8.0)

## 2020-12-28 LAB — POCT GLYCOSYLATED HEMOGLOBIN (HGB A1C)
HbA1c POC (<> result, manual entry): 8.9 % (ref 4.0–5.6)
HbA1c, POC (controlled diabetic range): 8.9 % — AB (ref 0.0–7.0)
HbA1c, POC (prediabetic range): 8.9 % — AB (ref 5.7–6.4)
Hemoglobin A1C: 8.9 % — AB (ref 4.0–5.6)

## 2020-12-28 LAB — GLUCOSE, POCT (MANUAL RESULT ENTRY): POC Glucose: 231 mg/dl — AB (ref 70–99)

## 2020-12-28 MED ORDER — METFORMIN HCL 500 MG PO TABS: 500.0000 mg | ORAL_TABLET | Freq: Two times a day (BID) | ORAL | 11 refills | Status: DC

## 2020-12-28 MED ORDER — FLUTICASONE-SALMETEROL 100-50 MCG/ACT IN AEPB: 1.0000 | INHALATION_SPRAY | Freq: Two times a day (BID) | RESPIRATORY_TRACT | 11 refills | Status: DC

## 2020-12-28 MED ORDER — LANTUS SOLOSTAR 100 UNIT/ML ~~LOC~~ SOPN
40.0000 [IU] | PEN_INJECTOR | Freq: Two times a day (BID) | SUBCUTANEOUS | 11 refills | Status: DC
Start: 2020-12-28 — End: 2021-06-05

## 2020-12-28 MED ORDER — RIZATRIPTAN BENZOATE 10 MG PO TBDP: 10.0000 mg | ORAL_TABLET | ORAL | 0 refills | Status: DC | PRN

## 2020-12-28 MED ORDER — NOVOLOG FLEXPEN 100 UNIT/ML ~~LOC~~ SOPN: 10.0000 [IU] | PEN_INJECTOR | Freq: Three times a day (TID) | SUBCUTANEOUS | 11 refills | Status: DC

## 2020-12-28 MED ORDER — MONTELUKAST SODIUM 10 MG PO TABS: 10.0000 mg | ORAL_TABLET | Freq: Every day | ORAL | 11 refills | Status: DC

## 2020-12-28 MED ORDER — SIMVASTATIN 5 MG PO TABS: 5.0000 mg | ORAL_TABLET | Freq: Every day | ORAL | 11 refills | Status: DC

## 2020-12-28 MED ORDER — AMITRIPTYLINE HCL 25 MG PO TABS: ORAL_TABLET | ORAL | 11 refills | Status: DC

## 2020-12-28 MED ORDER — MELOXICAM 15 MG PO TABS: 15.0000 mg | ORAL_TABLET | Freq: Every day | ORAL | 11 refills | Status: DC

## 2020-12-28 MED ORDER — OLMESARTAN MEDOXOMIL-HCTZ 40-25 MG PO TABS: 1.0000 | ORAL_TABLET | Freq: Every day | ORAL | 3 refills | Status: DC

## 2020-12-28 MED ORDER — ALBUTEROL SULFATE HFA 108 (90 BASE) MCG/ACT IN AERS: INHALATION_SPRAY | RESPIRATORY_TRACT | 0 refills | Status: DC

## 2020-12-28 MED ORDER — CYCLOBENZAPRINE HCL 5 MG PO TABS: 5.0000 mg | ORAL_TABLET | Freq: Three times a day (TID) | ORAL | 0 refills | Status: DC | PRN

## 2020-12-28 NOTE — Progress Notes (Signed)
[address], [place]  [address] Phone:  [phone]   Fax:  [phone]   Established Patient Office Visit  Subjective:  Patient ID: Lori Shea, female    DOB: Dec 20, 1975  Age: 45 y.o. MRN: [id number]  CC:  Chief Complaint  Patient presents with   Follow-up    Pt is here for her follow up visit. Pt states that she has been having sharp pains in both her ears x 2 months.    HPI Lori Shea presents for follow up. She  has a past medical history of Allergy, Anemia, Arthritis, Asthma (age 48 yo), Cholelithiasis (age 87 yo), COVID-19, Diabetes mellitus (2012), Elevated liver enzymes (09/13/2016), GERD (gastroesophageal reflux disease), Heart murmur, History of kidney stones, Hypertension (2015), Kidney stones, Migraines (2009), Morbid obesity ([place]), PCOS (polycystic ovarian syndrome) (2010), and Pneumonia.   She reports that she has been walking for the last month approximately 2 miles daily. She is current Lantus 40 BID. She uses the Novolog 10-14 units TID. She reports with a bigger meal she uses 16 units.  She reports CBG 125-225. She also reports that with sugar free ice cream her next morning CBG fasting 200. She is also on Metformin daily and tolerates well. She reports side effects with the glipizide; recurrent yeast infections. However it was effective for her CBG.. She reports that she is trying to work on her diet. She is requesting weight loss aide. She has been unable too lose weight.   She reports ongoing ear pain for the last several months. She had bumps in her ears.; she reports this is from using Q'tips.  She denies any URI symptoms associate with the ear pain.   She was in [place] visiting her mother for several weeks. She has been under increased stress since returning home this is reflected in her BP.   Past Medical History:  Diagnosis Date   Allergy    Foods:  mango, honey, corn.  Allergic to "everything tested"   15 years ago in [place]   Anemia    with pregnancy   Arthritis    Shoulder and hips   Asthma age 37 yo   Triggers:  cold weather, exercise, allergens, anxiety, URIs   Cholelithiasis age 46 yo   Asymptomatic   COVID-19    Diabetes mellitus 2012   Was diagnosed 2 years earlier with PCOS   Elevated liver enzymes 09/13/2016   GERD (gastroesophageal reflux disease)    Heart murmur    with pregnancy   History of kidney stones    Hypertension 2015   Kidney stones    family history of renal failure   Migraines 2009   Morbid obesity ([place])    PCOS (polycystic ovarian syndrome) 2010   Pneumonia     Past Surgical History:  Procedure Laterality Date   CHOLECYSTECTOMY N/A 12/01/2019   Procedure: LAPAROSCOPIC CHOLECYSTECTOMY;  Surgeon: Armandina Gemma, MD;  Location: [facility];  Service: General;  Laterality: N/A;   COLPOSCOPY N/A    LAPAROSCOPIC TUBAL LIGATION Bilateral 01/28/2019   Procedure: LAPAROSCOPIC TUBAL LIGATION AND PAP SMEAR;  Surgeon: Aletha Halim, MD;  Location: [facility];  Service: Gynecology;  Laterality: Bilateral;   TONSILLECTOMY AND ADENOIDECTOMY Bilateral 12/23/2018   Procedure: TONSILLECTOMY AND ADENOIDECTOMY;  Surgeon: Leta Baptist, MD;  Location: [facility];  Service: ENT;  Laterality: Bilateral;    Family History  Problem Relation Age of Onset   Diabetes Father  Hyperlipidemia Father    Hypertension Father    Heart disease Father    Kidney disease Father        Developed after 2nd CABG   Diabetes Paternal Grandfather    Heart disease Paternal Grandfather    Hyperlipidemia Paternal Grandfather    Hypertension Paternal Grandfather    Kidney disease Paternal Grandfather     Social History   Socioeconomic History   Marital status: Divorced    Spouse name: Not on file   Number of children: 1   Years of education: some comm. college   Highest education level: Not on file  Occupational History   Occupation: CNA    Comment: [employer]  Tobacco Use   Smoking status: Never   Smokeless tobacco: Never  Vaping Use   Vaping Use: Never used  Substance and Sexual Activity   Alcohol use: No   Drug use: No   Sexual activity: Not Currently    Birth control/protection: None, Surgical    Comment: Sprintec.  Tubal ligation  Other Topics Concern   Not on file  Social History Narrative   Originally from [place], [place] to [place] in 1988.   In [place] in [place] at home with daughter on [address].   Social Determinants of Health   Financial Resource Strain: Not on file  Food Insecurity: Not on file  Transportation Needs: Not on file  Physical Activity: Not on file  Stress: Not on file  Social Connections: Not on file  Intimate Partner Violence: Not on file    Outpatient Medications Prior to Visit  Medication Sig Dispense Refill   albuterol (PROVENTIL) (2.5 MG/3ML) 0.083% nebulizer solution Take 3 mLs (2.5 mg total) by nebulization every 6 (six) hours as needed for wheezing or shortness of breath. 75 mL 6   cetirizine (ZYRTEC) 10 MG tablet Take 1 tablet (10 mg total) by mouth daily. 30 tablet 11   EASY COMFORT PEN NEEDLES 32G X 4 MM MISC use as directed test 2 times daily  3   esomeprazole (NEXIUM) 20 MG capsule Take 1 capsule (20 mg total) by mouth at bedtime. 90 capsule 3   ibuprofen (ADVIL) 800 MG tablet Take 1 tablet (800 mg total) by mouth 3 (three) times daily. 21 tablet 0   Insulin Pen Needle (PEN NEEDLES) 32G X 5 MM MISC 100 each by Does not apply route 2 (two) times daily. 100 each 3   MAGNESIUM CITRATE PO Take 1 capsule by mouth every morning.      albuterol (VENTOLIN HFA) 108 (90 Base) MCG/ACT inhaler INHALE 2 PUFFS INTO THE LUNGS EVERY 4 HOURS AS NEEDED FOR WHEEZING OR SHORTNESS OF BREATH. 1 each 0   amitriptyline (ELAVIL) 25 MG tablet TAKE 1 TABLET BY MOUTH NIGHTLY AT BEDTIME 30 tablet 5   fluticasone-salmeterol (ADVAIR DISKUS) 100-50 MCG/ACT AEPB Inhale 1 puff into  the lungs 2 (two) times daily. 60 each 11   insulin aspart (NOVOLOG FLEXPEN) 100 UNIT/ML FlexPen Inject 10-14 Units into the skin 3 (three) times daily before meals. 30 mL 5   LANTUS SOLOSTAR 100 UNIT/ML Solostar Pen INJECT 50 UNITS INTO THE SKIN 2 TIMES DAILY 30 mL 6   meloxicam (MOBIC) 15 MG tablet TAKE 1 TABLET BY MOUTH EVERY DAY 30 tablet 3   metFORMIN (GLUCOPHAGE) 500 MG tablet TAKE 1 TABLET BY MOUTH 2 TIMES DAILY WITH A MEAL 60 tablet 5   montelukast (SINGULAIR) 10 MG tablet TAKE 1  TABLET BY MOUTH EVERY DAY 90 tablet 3   olmesartan-hydrochlorothiazide (BENICAR HCT) 40-25 MG tablet Take 1 tablet by mouth daily. 90 tablet 3   rizatriptan (MAXALT-MLT) 10 MG disintegrating tablet Take 1 tablet (10 mg total) by mouth as needed for migraine. May repeat in 2 hours if needed 12 tablet 0   simvastatin (ZOCOR) 5 MG tablet TAKE 1 TABLET BY MOUTH NIGHTLY AT BEDTIME 90 tablet 2   Azilsartan-Chlorthalidone (EDARBYCLOR) 40-25 MG TABS Take 1 tablet by mouth daily. 30 tablet 0   cyclobenzaprine (FLEXERIL) 5 MG tablet Take 1 tablet (5 mg total) by mouth 3 (three) times daily as needed for muscle spasms. (Patient not taking: Reported on 12/28/2020) 10 tablet 0   fluocinonide (LIDEX) 0.05 % external solution Apply 1 application topically 2 (two) times daily. (Patient not taking: Reported on 12/28/2020) 60 mL 0   fluticasone (FLONASE) 50 MCG/ACT nasal spray Place 2 sprays into both nostrils daily. (Patient not taking: Reported on 12/28/2020) 16 g 6   No facility-administered medications prior to visit.    Allergies  Allergen Reactions   Honey Shortness Of Breath   Mango Flavor Hives and Swelling    Pt allergic to all mango - tongue and throat swelling   Glipizide     Other reaction(s): Other   Liraglutide Nausea And Vomiting    ROS Review of Systems    Objective:    Physical Exam Constitutional:      Appearance: She is obese.  HENT:     Head: Normocephalic and atraumatic.     Right Ear: Tympanic  membrane normal.     Left Ear: Tympanic membrane normal.  Cardiovascular:     Rate and Rhythm: Normal rate.     Pulses: Normal pulses.     Heart sounds: Normal heart sounds.     Comments: Regular irregularity Pulmonary:     Effort: Pulmonary effort is normal.     Breath sounds: Normal breath sounds.  Abdominal:     Comments: Increased abdominal girth   Musculoskeletal:        General: Normal range of motion.     Cervical back: Normal range of motion and neck supple.  Skin:    General: Skin is warm and dry.     Capillary Refill: Capillary refill takes less than 2 seconds.  Neurological:     General: No focal deficit present.     Mental Status: She is alert and oriented to person, place, and time.  Psychiatric:        Mood and Affect: Mood normal.        Behavior: Behavior normal.        Thought Content: Thought content normal.        Judgment: Judgment normal.    BP (!) 144/88   Pulse 92   Temp 98 F (36.7 C)   Ht '5\' 5"'  (1.651 m)   Wt 284 lb 3.2 oz (128.9 kg)   SpO2 95%   BMI 47.29 kg/m  Wt Readings from Last 3 Encounters:  12/28/20 284 lb 3.2 oz (128.9 kg)  06/24/20 279 lb (126.6 kg)  05/06/20 273 lb (123.8 kg)     There are no preventive care reminders to display for this patient.   There are no preventive care reminders to display for this patient.  Lab Results  Component Value Date   TSH 2.780 02/11/2020   Lab Results  Component Value Date   WBC 9.1 06/24/2020   HGB 12.9 06/24/2020  HCT 39.2 06/24/2020   MCV 85 06/24/2020   PLT 267 06/24/2020   Lab Results  Component Value Date   NA 138 09/26/2020   K 4.1 09/26/2020   CO2 24 11/26/2019   GLUCOSE 337 (H) 09/26/2020   BUN 13 09/26/2020   CREATININE 0.89 09/26/2020   BILITOT 0.4 09/26/2020   ALKPHOS 112 09/26/2020   AST 30 09/26/2020   ALT 27 11/18/2019   PROT 6.7 09/26/2020   ALBUMIN 4.0 09/26/2020   CALCIUM 9.0 09/26/2020   ANIONGAP 11 11/26/2019   EGFR 81 09/26/2020   Lab Results   Component Value Date   CHOL 146 09/26/2020   Lab Results  Component Value Date   HDL 41 09/26/2020   Lab Results  Component Value Date   LDLCALC 67 09/26/2020   Lab Results  Component Value Date   TRIG 230 (H) 09/26/2020   Lab Results  Component Value Date   CHOLHDL 3.6 09/26/2020   Lab Results  Component Value Date   HGBA1C 8.9 (A) 12/28/2020   HGBA1C 8.9 12/28/2020   HGBA1C 8.9 (A) 12/28/2020   HGBA1C 8.9 (A) 12/28/2020      Assessment & Plan:   Problem List Items Addressed This Visit       Respiratory   Asthma   Relevant Medications   albuterol (VENTOLIN HFA) 108 (90 Base) MCG/ACT inhaler   fluticasone-salmeterol (ADVAIR DISKUS) 100-50 MCG/ACT AEPB   montelukast (SINGULAIR) 10 MG tablet     Endocrine   Type 2 diabetes mellitus with hyperglycemia, with long-term current use of insulin (HCC) - Primary Uncontrolled however improving Encourage compliance with current treatment regimen  Dose adjustment none Encourage regular CBG monitoring Encourage contacting office if excessive hyperglycemia and or hypoglycemia Continue Lifestyle modification with healthy diet (fewer calories, more high fiber foods, whole grains and non-starchy vegetables, lower fat meat and fish, low-fat diary include healthy oils) regular exercise (physical activity) and weight loss Opthalmology exam discussed  Nutritional consult recommended Home BP monitoring also encouraged goal <130/80    Relevant Medications   insulin aspart (NOVOLOG FLEXPEN) 100 UNIT/ML FlexPen   insulin glargine (LANTUS SOLOSTAR) 100 UNIT/ML Solostar Pen   metFORMIN (GLUCOPHAGE) 500 MG tablet   olmesartan-hydrochlorothiazide (BENICAR HCT) 40-25 MG tablet   simvastatin (ZOCOR) 5 MG tablet   Other Relevant Orders   POCT URINALYSIS DIP (CLINITEK) (Completed)   HgB A1c (Completed)   Glucose (CBG) (Completed)   Comp. 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  Discussed proper diet (low fat, low sodium, high fiber) with patient.   Discussed need for regular exercise (3 times per week, 20 minutes per session) with patient.    Relevant Medications   insulin aspart (NOVOLOG FLEXPEN) 100 UNIT/ML FlexPen   insulin glargine (LANTUS SOLOSTAR) 100 UNIT/ML Solostar Pen   metFORMIN (GLUCOPHAGE) 500 MG tablet   Other Relevant Orders   Amb Ref to Medical Weight Management   Asthma in adult, mild intermittent, uncomplicated       Relevant Medications   albuterol (VENTOLIN HFA) 108 (90 Base) MCG/ACT inhaler   fluticasone-salmeterol (ADVAIR DISKUS) 100-50 MCG/ACT AEPB   montelukast (SINGULAIR) 10 MG tablet   Insomnia, unspecified type   Stable Continue with current regimen.  No changes warranted. Good patient compliance.      Relevant Medications   amitriptyline (ELAVIL) 25 MG tablet   Acute right-sided low back pain, unspecified  whether sciatica present     Stable Continue with current regimen.  No changes warranted. Good patient compliance.    Relevant Medications   meloxicam (MOBIC) 15 MG tablet   cyclobenzaprine (FLEXERIL) 5 MG tablet   Type 2 diabetes mellitus without complication, with long-term current use of insulin (HCC)       Relevant Medications   insulin aspart (NOVOLOG FLEXPEN) 100 UNIT/ML FlexPen   insulin glargine (LANTUS SOLOSTAR) 100 UNIT/ML Solostar Pen   metFORMIN (GLUCOPHAGE) 500 MG tablet   olmesartan-hydrochlorothiazide (BENICAR HCT) 40-25 MG tablet   simvastatin (ZOCOR) 5 MG tablet       Meds ordered this encounter  Medications   albuterol (VENTOLIN HFA) 108 (90 Base) MCG/ACT inhaler    Sig: INHALE 2 PUFFS INTO THE LUNGS EVERY 4 HOURS AS NEEDED FOR WHEEZING OR SHORTNESS OF BREATH.    Dispense:  1 each    Refill:  0    Order Specific Question:   Supervising Provider    Answer:   Tresa Garter [[id number]]   amitriptyline (ELAVIL) 25 MG tablet    Sig: TAKE 1 TABLET BY MOUTH NIGHTLY AT BEDTIME    Dispense:  30 tablet    Refill:  11    Order Specific Question:   Supervising Provider    Answer:   Tresa Garter [[id number]]   fluticasone-salmeterol (ADVAIR DISKUS) 100-50 MCG/ACT AEPB    Sig: Inhale 1 puff into the lungs 2 (two) times daily.    Dispense:  60 each    Refill:  11    Order Specific Question:   Supervising Provider    Answer:   Tresa Garter [[id number]]   insulin aspart (NOVOLOG FLEXPEN) 100 UNIT/ML FlexPen    Sig: Inject 10-14 Units into the skin 3 (three) times daily before meals.    Dispense:  30 mL    Refill:  11    Order Specific Question:   Supervising Provider    Answer:   Tresa Garter [[id number]]   insulin glargine (LANTUS SOLOSTAR) 100 UNIT/ML Solostar Pen    Sig: Inject 40 Units into the skin 2 (two) times daily.    Dispense:  24 mL    Refill:  11    Order Specific Question:   Supervising Provider    Answer:   Tresa Garter  [[id number]]   meloxicam (MOBIC) 15 MG tablet    Sig: Take 1 tablet (15 mg total) by mouth daily.    Dispense:  30 tablet  Refill:  11    This prescription was filled on 10/14/2020. Any refills authorized will be placed on file.    Order Specific Question:   Supervising Provider    Answer:   Tresa Garter [[id number]]   metFORMIN (GLUCOPHAGE) 500 MG tablet    Sig: Take 1 tablet (500 mg total) by mouth 2 (two) times daily with a meal.    Dispense:  60 tablet    Refill:  11    This prescription was filled on 10/14/2020. Any refills authorized will be placed on file.    Order Specific Question:   Supervising Provider    Answer:   Tresa Garter [[id number]]   montelukast (SINGULAIR) 10 MG tablet    Sig: Take 1 tablet (10 mg total) by mouth daily.    Dispense:  30 tablet    Refill:  11    This prescription was filled on 10/14/2020. Any refills authorized will be placed on file.    Order Specific Question:   Supervising Provider    Answer:   Tresa Garter [[id number]]   olmesartan-hydrochlorothiazide (BENICAR HCT) 40-25 MG tablet    Sig: Take 1 tablet by mouth daily.    Dispense:  90 tablet    Refill:  3    Order Specific Question:   Supervising Provider    Answer:   Tresa Garter [[id number]]   rizatriptan (MAXALT-MLT) 10 MG disintegrating tablet    Sig: Take 1 tablet (10 mg total) by mouth as needed for migraine. May repeat in 2 hours if needed    Dispense:  12 tablet    Refill:  0    Order Specific Question:   Supervising Provider    Answer:   Tresa Garter [[id number]]   simvastatin (ZOCOR) 5 MG tablet    Sig: Take 1 tablet (5 mg total) by mouth at bedtime.    Dispense:  30 tablet    Refill:  11    This prescription was filled on 07/12/2020. Any refills authorized will be placed on file.    Order Specific Question:   Supervising Provider    Answer:   Tresa Garter [[id number]]   cyclobenzaprine (FLEXERIL) 5 MG tablet    Sig: Take 1 tablet (5 mg total) by  mouth 3 (three) times daily as needed for muscle spasms.    Dispense:  10 tablet    Refill:  0    Order Specific Question:   Supervising Provider    Answer:   Tresa Garter [id number]    Follow-up: No follow-ups on file.    Lori Francois, NP

## 2020-12-29 LAB — LIPID PANEL
Chol/HDL Ratio: 3.6 ratio (ref 0.0–4.4)
Cholesterol, Total: 129 mg/dL (ref 100–199)
HDL: 36 mg/dL — ABNORMAL LOW (ref >39)
LDL Chol Calc (NIH): 60 mg/dL (ref 0–99)
Triglycerides: 203 mg/dL — ABNORMAL HIGH (ref 0–149)
VLDL Cholesterol Cal: 33 mg/dL (ref 5–40)

## 2020-12-29 LAB — COMP. METABOLIC PANEL (12)
AST: 27 IU/L (ref 0–40)
Albumin/Globulin Ratio: 1.8 (ref 1.2–2.2)
Albumin: 4.1 g/dL (ref 3.8–4.8)
Alkaline Phosphatase: 94 IU/L (ref 44–121)
BUN/Creatinine Ratio: 18 (ref 9–23)
BUN: 13 mg/dL (ref 6–24)
Bilirubin Total: 0.4 mg/dL (ref 0.0–1.2)
Calcium: 8.9 mg/dL (ref 8.7–10.2)
Chloride: 101 mmol/L (ref 96–106)
Creatinine, Ser: 0.72 mg/dL (ref 0.57–1.00)
Globulin, Total: 2.3 g/dL (ref 1.5–4.5)
Glucose: 210 mg/dL — ABNORMAL HIGH (ref 65–99)
Potassium: 3.8 mmol/L (ref 3.5–5.2)
Sodium: 138 mmol/L (ref 134–144)
Total Protein: 6.4 g/dL (ref 6.0–8.5)
eGFR: 104 mL/min/{1.73_m2} (ref >59)

## 2021-01-02 ENCOUNTER — Other Ambulatory Visit (HOSPITAL_COMMUNITY)
Admission: RE | Admit: 2021-01-02 | Discharge: 2021-01-02 | Disposition: A | Discharge status: Home / Self Care | Payer: 59 | Source: Ambulatory Visit | Attending: Obstetrics and Gynecology | Admitting: Obstetrics and Gynecology

## 2021-01-02 ENCOUNTER — Ambulatory Visit (INDEPENDENT_AMBULATORY_CARE_PROVIDER_SITE_OTHER): Payer: 59 | Admitting: Obstetrics and Gynecology

## 2021-01-02 ENCOUNTER — Other Ambulatory Visit: Payer: Self-pay

## 2021-01-02 ENCOUNTER — Other Ambulatory Visit (HOSPITAL_COMMUNITY)
Admission: RE | Admit: 2021-01-02 | Discharge: 2021-01-02 | Disposition: A | Discharge status: Home / Self Care | Payer: 59 | Source: Ambulatory Visit | Attending: Family Medicine | Admitting: Family Medicine

## 2021-01-02 VITALS — BP 124/86 | HR 79 | Ht 65.0 in | Wt 283.9 lb

## 2021-01-02 DIAGNOSIS — Z3202 Encounter for pregnancy test, result negative: Secondary | ICD-10-CM | POA: Diagnosis not present

## 2021-01-02 DIAGNOSIS — Z01419 Encounter for gynecological examination (general) (routine) without abnormal findings: Secondary | ICD-10-CM | POA: Insufficient documentation

## 2021-01-02 DIAGNOSIS — N879 Dysplasia of cervix uteri, unspecified: Secondary | ICD-10-CM | POA: Insufficient documentation

## 2021-01-02 DIAGNOSIS — R238 Other skin changes: Secondary | ICD-10-CM

## 2021-01-02 DIAGNOSIS — N393 Stress incontinence (female) (male): Secondary | ICD-10-CM | POA: Insufficient documentation

## 2021-01-02 DIAGNOSIS — N898 Other specified noninflammatory disorders of vagina: Secondary | ICD-10-CM

## 2021-01-02 DIAGNOSIS — L03818 Cellulitis of other sites: Secondary | ICD-10-CM

## 2021-01-02 LAB — POCT PREGNANCY, URINE: Preg Test, Ur: NEGATIVE

## 2021-01-02 MED ORDER — MEDROXYPROGESTERONE ACETATE 10 MG PO TABS: ORAL_TABLET | ORAL | 3 refills | Status: DC

## 2021-01-02 MED ORDER — DOXYCYCLINE HYCLATE 100 MG PO CAPS: 100.0000 mg | ORAL_CAPSULE | Freq: Two times a day (BID) | ORAL | 0 refills | Status: AC

## 2021-01-02 NOTE — Progress Notes (Signed)
Swelling on upper chest

## 2021-01-02 NOTE — Progress Notes (Signed)
Obstetrics and Gynecology Annual Patient Evaluation  Appointment Date: 01/02/2021  OBGYN Clinic: [facility]  Primary Care Provider: Vevelyn Francois  Chief Complaint:  Chief Complaint  Patient presents with   Gynecologic Exam    History of Present Illness: Lori Shea is a 45 y.o. [phone] (No LMP recorded. (Menstrual status: Irregular Periods).), seen for the above chief complaint. Her past medical history is significant for DM2, BMI 40s, h/o abnormal paps.   *Chest irritation: for past day or so and feels a bump, slightly red skin and slightly ttp. Near the sternum on the right side. H/o this in the remote past and she said it was mrsa  *SUI: pt with s/s that feels worse. No OAB s/s. Pt doesn't smoke  *AUB: pt states she has PCOS and that she had a few day so of spotting and bleeding in mid august and July and none in June and she hasn't had a period yet this month. She was on Sprintec until she had her BTL in October 2020 and she had normal, monthly periods on the Sprintec but went back to this kind of period profile since the BTL since she is off OCPs now.   Review of Systems: Pertinent items noted in HPI and remainder of comprehensive ROS otherwise negative.    Patient Active Problem List   Diagnosis Date Noted   Stress incontinence of urine 01/02/2021   Well woman exam with routine gynecological exam 01/02/2021   Skin irritation 01/02/2021   Hepatic steatosis 12/01/2019   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/2019   Hemoglobin A1C greater than 9%, indicating poor diabetic control 03/23/2019   Hyperglycemia 03/23/2019   Class 3 severe obesity due to excess calories with serious comorbidity and body mass index (BMI) of 40.0 to 44.9 in adult ([place]) 03/23/2019   Yeast infection 03/23/2019   BMI 40.0-44.9, adult ([place]) 03/09/2019   Cervical dysplasia 07/31/2017   Alopecia of scalp 05/09/2017   Pain in left toe(s) 12/27/2016    Closed avulsion fracture of lateral malleolus of right fibula 12/27/2016   Morbid obesity ([place]) 09/13/2016   Elevated liver enzymes 09/13/2016   Allergy    Asthma    Hypertension 04/23/2013   Type 2 diabetes mellitus with hyperglycemia, with long-term current use of insulin ([place]) 04/23/2010   PCOS (polycystic ovarian syndrome) 04/23/2008   Migraines 04/24/2007   Past Medical History:  Past Medical History:  Diagnosis Date   Allergy    Foods:  mango, honey, corn.  Allergic to "everything tested"  15 years ago in [place]   Anemia    with pregnancy   Arthritis    Shoulder and hips   Asthma age 57 yo   Triggers:  cold weather, exercise, allergens, anxiety, URIs   Cholelithiasis age 66 yo   Asymptomatic   COVID-19    Diabetes mellitus 2012   Was diagnosed 2 years earlier with PCOS   Elevated liver enzymes 09/13/2016   GERD (gastroesophageal reflux disease)    Heart murmur    with pregnancy   History of kidney stones    Hypertension 2015   Kidney stones    family history of renal failure   Migraines 2009   Morbid obesity ([place])    PCOS (polycystic ovarian syndrome) 2010   Pneumonia     Past Surgical History:  Past Surgical History:  Procedure Laterality Date   CHOLECYSTECTOMY N/A 12/01/2019   Procedure: LAPAROSCOPIC CHOLECYSTECTOMY;  Surgeon: Armandina Gemma, MD;  Location: [facility];  Service: General;  Laterality: N/A;   COLPOSCOPY N/A    LAPAROSCOPIC TUBAL LIGATION Bilateral 01/28/2019   Procedure: LAPAROSCOPIC TUBAL LIGATION AND PAP SMEAR;  Surgeon: Aletha Halim, MD;  Location: [facility];  Service: Gynecology;  Laterality: Bilateral;   TONSILLECTOMY AND ADENOIDECTOMY Bilateral 12/23/2018   Procedure: TONSILLECTOMY AND ADENOIDECTOMY;  Surgeon: Leta Baptist, MD;  Location: [facility];  Service: ENT;  Laterality: Bilateral;    Past Obstetrical History:  OB History  Gravida Para Term Preterm AB Living  '4 1 1   3 1  '$ SAB IAB Ectopic Multiple Live  Births  3            # Outcome Date GA Lbr Len/2nd Weight Sex Delivery Anes PTL Lv  4 SAB           3 Term           2 SAB           1 SAB             Past Gynecological History: As per HPI. History of Pap Smear(s):  1 year rpt advised 04/2019 colpo (adequate): bx with cin1, ecc negative '[x]'$  repeat pap and hpv testing 09/2018-->ascus/hpv pos on 01/2019 with some cells suggestive of high grade.   01/2019 pap: ascus/hpv pos on 01/2019 with some cells suggestive of high grade.  09/2017 colpo (adquate): bx with HPV effect and ECC negative 05/2017 pap: LSIL 06/2012 pap: negative 03/2011 pap: negative 02/2009 pap: negative and hpv neg  She is currently using bilateral tubal ligation for contraception.   Social History:  Social History   Socioeconomic History   Marital status: Divorced    Spouse name: Not on file   Number of children: 1   Years of education: some comm. college   Highest education level: Not on file  Occupational History   Occupation: CNA    Comment: [employer]  Tobacco Use   Smoking status: Never   Smokeless tobacco: Never  Vaping Use   Vaping Use: Never used  Substance and Sexual Activity   Alcohol use: No   Drug use: No   Sexual activity: Not Currently    Birth control/protection: None, Surgical    Comment: Sprintec.  Tubal ligation  Other Topics Concern   Not on file  Social History Narrative   Originally from [place], [place] to [place] in 1988.   In [place] in [place] at home with daughter on [address].   Social Determinants of Health   Financial Resource Strain: Not on file  Food Insecurity: Not on file  Transportation Needs: Not on file  Physical Activity: Not on file  Stress: Not on file  Social Connections: Not on file  Intimate Partner Violence: Not on file    Family History:  Family History  Problem Relation Age of Onset   Diabetes Father    Hyperlipidemia Father    Hypertension Father     Heart disease Father    Kidney disease Father        Developed after 2nd CABG   Diabetes Paternal Grandfather    Heart disease Paternal Grandfather    Hyperlipidemia Paternal Grandfather    Hypertension Paternal Grandfather    Kidney disease Paternal Grandfather    Health Maintenance:  Mammogram(s): none  Medications Carine Guillet. Eugenio Hoes "Juliann Pulse" had no medications administered during this visit. Current Outpatient Medications  Medication Sig Dispense Refill   albuterol (  PROVENTIL) (2.5 MG/3ML) 0.083% nebulizer solution Take 3 mLs (2.5 mg total) by nebulization every 6 (six) hours as needed for wheezing or shortness of breath. 75 mL 6   albuterol (VENTOLIN HFA) 108 (90 Base) MCG/ACT inhaler INHALE 2 PUFFS INTO THE LUNGS EVERY 4 HOURS AS NEEDED FOR WHEEZING OR SHORTNESS OF BREATH. 1 each 0   amitriptyline (ELAVIL) 25 MG tablet TAKE 1 TABLET BY MOUTH NIGHTLY AT BEDTIME 30 tablet 11   cetirizine (ZYRTEC) 10 MG tablet Take 1 tablet (10 mg total) by mouth daily. 30 tablet 11   cyclobenzaprine (FLEXERIL) 5 MG tablet Take 1 tablet (5 mg total) by mouth 3 (three) times daily as needed for muscle spasms. 10 tablet 0   EASY COMFORT PEN NEEDLES 32G X 4 MM MISC use as directed test 2 times daily  3   esomeprazole (NEXIUM) 20 MG capsule Take 1 capsule (20 mg total) by mouth at bedtime. 90 capsule 3   fluticasone-salmeterol (ADVAIR DISKUS) 100-50 MCG/ACT AEPB Inhale 1 puff into the lungs 2 (two) times daily. 60 each 11   ibuprofen (ADVIL) 800 MG tablet Take 1 tablet (800 mg total) by mouth 3 (three) times daily. 21 tablet 0   insulin aspart (NOVOLOG FLEXPEN) 100 UNIT/ML FlexPen Inject 10-14 Units into the skin 3 (three) times daily before meals. 30 mL 11   insulin glargine (LANTUS SOLOSTAR) 100 UNIT/ML Solostar Pen Inject 40 Units into the skin 2 (two) times daily. 24 mL 11   Insulin Pen Needle (PEN NEEDLES) 32G X 5 MM MISC 100 each by Does not apply route 2 (two) times daily. 100 each 3   MAGNESIUM  CITRATE PO Take 1 capsule by mouth every morning.      meloxicam (MOBIC) 15 MG tablet Take 1 tablet (15 mg total) by mouth daily. 30 tablet 11   metFORMIN (GLUCOPHAGE) 500 MG tablet Take 1 tablet (500 mg total) by mouth 2 (two) times daily with a meal. 60 tablet 11   montelukast (SINGULAIR) 10 MG tablet Take 1 tablet (10 mg total) by mouth daily. 30 tablet 11   olmesartan-hydrochlorothiazide (BENICAR HCT) 40-25 MG tablet Take 1 tablet by mouth daily. 90 tablet 3   rizatriptan (MAXALT-MLT) 10 MG disintegrating tablet Take 1 tablet (10 mg total) by mouth as needed for migraine. May repeat in 2 hours if needed 12 tablet 0   simvastatin (ZOCOR) 5 MG tablet Take 1 tablet (5 mg total) by mouth at bedtime. 30 tablet 11   No current facility-administered medications for this visit.    Allergies Honey, Mango flavor, Glipizide, and Liraglutide   Physical Exam:  BP 124/86   Pulse 79   Ht '5\' 5"'$  (1.651 m)   Wt 283 lb 14.4 oz (128.8 kg)   BMI 47.24 kg/m  Body mass index is 47.24 kg/m.  General appearance: Well nourished, well developed female in no acute distress.  Neck:  Supple, normal appearance, and no thyromegaly  Cardiovascular: normal s1 and s2.  No murmurs, rubs or gallops. Respiratory:  Clear to auscultation bilateral. Normal respiratory effort Abdomen: positive bowel sounds and no masses, hernias; diffusely non tender to palpation, non distended Breasts: breasts appear normal, no suspicious masses, no skin or nipple changes or axillary nodes, normal palpation Chest: just the right of the sternum, at the junction of the body and manubrium, there is a fluctuant area below the skin (approx 2cm), minimally ttp, and very slight erythema there. Neuro/Psych:  Normal mood and affect.  Skin:  Warm and  dry.  Lymphatic:  No inguinal lymphadenopathy.   Pelvic exam: is limited by body habitus EGBUS: within normal limits Vagina: within normal limits and with no blood in vault. +white/yellow d/c in  vault Cervix: normal appearing cervix without tenderness, discharge or lesions. Uterus:  nonenlarged and non tender Adnexa:  normal adnexa and no mass, fullness, tenderness Rectovaginal: deferred  See procedure note for embx  Laboratory: UPT negative  Radiology: none  Assessment: pt stable  Plan:  1. Well woman exam with routine gynecological exam - MM 3D SCREEN BREAST BILATERAL; Future - Cytology - PAP( [facility])  2. Cervical dysplasia - Cytology - PAP( [place])  3. Stress incontinence of urine Weight loss recommended to patient, and she was offered PT and/or urogyn referral, but patient desires expectant management.  I also recommended at home pelvic floor exercises and behavioral interventions like regular voiding.   4. Skin irritation Doxycyline sent in and urgent precautions given  5. AUB F/u embx and pap smear. I d/w her re: importance of regular period, namely to decrease endometrial hyperplasia risk.  Options d/w her and will do cyclic provera  Orders Placed This Encounter  Procedures   MM 3D SCREEN BREAST BILATERAL    RTC PRN  Durene Romans MD Attending [facility])

## 2021-01-02 NOTE — Procedures (Signed)
Endometrial Biopsy Procedure Note  Pre-operative Diagnosis: AUB. H/o PCOS. BMI 40s. DM2  Post-operative Diagnosis: same  Procedure Details  Urine pregnancy test was done  and result was negative.  The risks (including infection, bleeding, pain, and uterine perforation) and benefits of the procedure were explained to the patient and Written informed consent was obtained.  The patient was placed in the dorsal lithotomy position.  Bimanual exam showed the uterus to be in the neutral position.  A Graves' speculum inserted in the vagina, and the cervix was visualized and a pap smear and swab performed. The cervix was then prepped with povidone iodine, and a sharp tenaculum was applied to the anterior lip of the cervix for stabilization.  A pipelle was inserted into the uterine cavity and sounded the uterus to a depth of 8.5cm.  A Small amount of tissue was collected after 2 passes. The sample was sent for pathologic examination.  Condition: Stable  Complications: None  Plan: The patient was advised to call for any fever or for prolonged or severe pain or bleeding. She was advised to use OTC analgesics as needed for mild to moderate pain. She was advised to avoid vaginal intercourse for 48 hours or until the bleeding has completely stopped.  Durene Romans MD Attending [facility])

## 2021-01-03 ENCOUNTER — Ambulatory Visit
Admission: RE | Admit: 2021-01-03 | Discharge: 2021-01-03 | Disposition: A | Discharge status: Home / Self Care | Payer: 59 | Source: Ambulatory Visit | Attending: Obstetrics and Gynecology | Admitting: Obstetrics and Gynecology

## 2021-01-03 DIAGNOSIS — Z1231 Encounter for screening mammogram for malignant neoplasm of breast: Secondary | ICD-10-CM

## 2021-01-03 DIAGNOSIS — Z01419 Encounter for gynecological examination (general) (routine) without abnormal findings: Secondary | ICD-10-CM

## 2021-01-03 DIAGNOSIS — N6310 Unspecified lump in the right breast, unspecified quadrant: Secondary | ICD-10-CM

## 2021-01-03 LAB — CERVICOVAGINAL ANCILLARY ONLY
Bacterial Vaginitis (gardnerella): POSITIVE — AB
Candida Glabrata: POSITIVE — AB
Candida Vaginitis: NEGATIVE
Chlamydia: NEGATIVE
Comment: NEGATIVE
Comment: NEGATIVE
Comment: NEGATIVE
Comment: NEGATIVE
Comment: NEGATIVE
Comment: NORMAL
Neisseria Gonorrhea: NEGATIVE
Trichomonas: NEGATIVE

## 2021-01-04 ENCOUNTER — Emergency Department (HOSPITAL_BASED_OUTPATIENT_CLINIC_OR_DEPARTMENT_OTHER)
Admission: EM | Admit: 2021-01-04 | Discharge: 2021-01-04 | Disposition: A | Discharge status: Home / Self Care | Payer: 59 | Attending: Emergency Medicine | Admitting: Emergency Medicine

## 2021-01-04 ENCOUNTER — Other Ambulatory Visit: Payer: Self-pay | Admitting: Obstetrics and Gynecology

## 2021-01-04 ENCOUNTER — Encounter (HOSPITAL_BASED_OUTPATIENT_CLINIC_OR_DEPARTMENT_OTHER): Payer: Self-pay

## 2021-01-04 ENCOUNTER — Other Ambulatory Visit: Payer: Self-pay

## 2021-01-04 DIAGNOSIS — L0291 Cutaneous abscess, unspecified: Secondary | ICD-10-CM

## 2021-01-04 DIAGNOSIS — O0289 Other abnormal products of conception: Secondary | ICD-10-CM | POA: Diagnosis not present

## 2021-01-04 DIAGNOSIS — Z79899 Other long term (current) drug therapy: Secondary | ICD-10-CM | POA: Insufficient documentation

## 2021-01-04 DIAGNOSIS — J45909 Unspecified asthma, uncomplicated: Secondary | ICD-10-CM | POA: Insufficient documentation

## 2021-01-04 DIAGNOSIS — R824 Acetonuria: Secondary | ICD-10-CM | POA: Diagnosis not present

## 2021-01-04 DIAGNOSIS — N6310 Unspecified lump in the right breast, unspecified quadrant: Secondary | ICD-10-CM

## 2021-01-04 DIAGNOSIS — Z8616 Personal history of COVID-19: Secondary | ICD-10-CM | POA: Insufficient documentation

## 2021-01-04 DIAGNOSIS — I10 Essential (primary) hypertension: Secondary | ICD-10-CM | POA: Diagnosis not present

## 2021-01-04 DIAGNOSIS — L02213 Cutaneous abscess of chest wall: Secondary | ICD-10-CM | POA: Diagnosis not present

## 2021-01-04 DIAGNOSIS — Z7984 Long term (current) use of oral hypoglycemic drugs: Secondary | ICD-10-CM | POA: Insufficient documentation

## 2021-01-04 DIAGNOSIS — R0789 Other chest pain: Secondary | ICD-10-CM | POA: Diagnosis present

## 2021-01-04 DIAGNOSIS — E1165 Type 2 diabetes mellitus with hyperglycemia: Secondary | ICD-10-CM | POA: Diagnosis not present

## 2021-01-04 DIAGNOSIS — Z7951 Long term (current) use of inhaled steroids: Secondary | ICD-10-CM | POA: Diagnosis not present

## 2021-01-04 DIAGNOSIS — Z794 Long term (current) use of insulin: Secondary | ICD-10-CM | POA: Insufficient documentation

## 2021-01-04 DIAGNOSIS — R739 Hyperglycemia, unspecified: Secondary | ICD-10-CM

## 2021-01-04 LAB — COMPREHENSIVE METABOLIC PANEL
ALT: 33 U/L (ref 0–44)
AST: 21 U/L (ref 15–41)
Albumin: 3.8 g/dL (ref 3.5–5.0)
Alkaline Phosphatase: 93 U/L (ref 38–126)
Anion gap: 9 (ref 5–15)
BUN: 14 mg/dL (ref 6–20)
CO2: 24 mmol/L (ref 22–32)
Calcium: 9 mg/dL (ref 8.9–10.3)
Chloride: 104 mmol/L (ref 98–111)
Creatinine, Ser: 0.59 mg/dL (ref 0.44–1.00)
GFR, Estimated: >60 mL/min (ref >60)
Glucose, Bld: 283 mg/dL — ABNORMAL HIGH (ref 70–99)
Potassium: 3.7 mmol/L (ref 3.5–5.1)
Sodium: 137 mmol/L (ref 135–145)
Total Bilirubin: 0.4 mg/dL (ref 0.3–1.2)
Total Protein: 6.5 g/dL (ref 6.5–8.1)

## 2021-01-04 LAB — CBC WITH DIFFERENTIAL/PLATELET
Abs Immature Granulocytes: 0.03 10*3/uL (ref 0.00–0.07)
Basophils Absolute: 0.1 10*3/uL (ref 0.0–0.1)
Basophils Relative: 1 %
Eosinophils Absolute: 0.3 10*3/uL (ref 0.0–0.5)
Eosinophils Relative: 3 %
HCT: 36.3 % (ref 36.0–46.0)
Hemoglobin: 12.5 g/dL (ref 12.0–15.0)
Immature Granulocytes: 0 %
Lymphocytes Relative: 34 %
Lymphs Abs: 3 10*3/uL (ref 0.7–4.0)
MCH: 29.1 pg (ref 26.0–34.0)
MCHC: 34.4 g/dL (ref 30.0–36.0)
MCV: 84.6 fL (ref 80.0–100.0)
Monocytes Absolute: 0.5 10*3/uL (ref 0.1–1.0)
Monocytes Relative: 6 %
Neutro Abs: 5.1 10*3/uL (ref 1.7–7.7)
Neutrophils Relative %: 56 %
Platelets: 260 10*3/uL (ref 150–400)
RBC: 4.29 MIL/uL (ref 3.87–5.11)
RDW: 13.6 % (ref 11.5–15.5)
WBC: 9 10*3/uL (ref 4.0–10.5)
nRBC: 0 % (ref 0.0–0.2)

## 2021-01-04 LAB — URINALYSIS, ROUTINE W REFLEX MICROSCOPIC
Bilirubin Urine: NEGATIVE
Glucose, UA: >1000 mg/dL — AB
Leukocytes,Ua: NEGATIVE
Nitrite: NEGATIVE
Protein, ur: NEGATIVE mg/dL
RBC / HPF: >50 RBC/hpf — ABNORMAL HIGH (ref 0–5)
Specific Gravity, Urine: 1.028 (ref 1.005–1.030)
pH: 5 (ref 5.0–8.0)

## 2021-01-04 LAB — I-STAT VENOUS BLOOD GAS, ED
Acid-base deficit: 1 mmol/L (ref 0.0–2.0)
Bicarbonate: 24.1 mmol/L (ref 20.0–28.0)
Calcium, Ion: 1.23 mmol/L (ref 1.15–1.40)
HCT: 36 % (ref 36.0–46.0)
Hemoglobin: 12.2 g/dL (ref 12.0–15.0)
O2 Saturation: 63 %
Patient temperature: 98.3
Potassium: 3.7 mmol/L (ref 3.5–5.1)
Sodium: 138 mmol/L (ref 135–145)
TCO2: 25 mmol/L (ref 22–32)
pCO2, Ven: 40 mmHg — ABNORMAL LOW (ref 44.0–60.0)
pH, Ven: 7.386 (ref 7.250–7.430)
pO2, Ven: 33 mmHg (ref 32.0–45.0)

## 2021-01-04 LAB — PREGNANCY, URINE: Preg Test, Ur: NEGATIVE

## 2021-01-04 LAB — SURGICAL PATHOLOGY

## 2021-01-04 LAB — CBG MONITORING, ED: Glucose-Capillary: 255 mg/dL — ABNORMAL HIGH (ref 70–99)

## 2021-01-04 MED ORDER — ONDANSETRON 4 MG PO TBDP: 4.0000 mg | ORAL_TABLET | Freq: Once | ORAL | Status: DC

## 2021-01-04 MED ORDER — CLINDAMYCIN HCL 150 MG PO CAPS
300.0000 mg | ORAL_CAPSULE | Freq: Once | ORAL | Status: AC
  Administered 2021-01-04: 300 mg via ORAL
  Filled 2021-01-04: qty 2

## 2021-01-04 MED ORDER — FENTANYL CITRATE PF 50 MCG/ML IJ SOSY
50.0000 ug | PREFILLED_SYRINGE | Freq: Once | INTRAMUSCULAR | Status: AC
  Administered 2021-01-04: 50 ug via INTRAMUSCULAR
  Filled 2021-01-04: qty 1

## 2021-01-04 MED ORDER — LIDOCAINE-EPINEPHRINE (PF) 2 %-1:200000 IJ SOLN
INTRAMUSCULAR | Status: AC
  Administered 2021-01-04: 20 mL
  Filled 2021-01-04: qty 20

## 2021-01-04 MED ORDER — CLINDAMYCIN HCL 150 MG PO CAPS: 300.0000 mg | ORAL_CAPSULE | Freq: Four times a day (QID) | ORAL | 0 refills | Status: AC

## 2021-01-04 MED ORDER — PROMETHAZINE HCL 25 MG PO TABS
25.0000 mg | ORAL_TABLET | Freq: Once | ORAL | Status: AC
  Administered 2021-01-04: 25 mg via ORAL
  Filled 2021-01-04: qty 1

## 2021-01-04 MED ORDER — LIDOCAINE-EPINEPHRINE 2 %-1:100000 IJ SOLN
20.0000 mL | Freq: Once | INTRAMUSCULAR | Status: AC
  Administered 2021-01-04: 20 mL via INTRADERMAL

## 2021-01-04 NOTE — ED Notes (Signed)
RT Note: CBG obtained with results given to MD @ bedside/downloaded to chart.

## 2021-01-04 NOTE — ED Notes (Signed)
RT Note: VBG obtained from RN lab blood drawl.

## 2021-01-04 NOTE — ED Triage Notes (Signed)
She has a red, swollen area near right manubrium; and has noticed this since this Sunday. She reports this as a prior (7 years ago) site of a MRSA infection which cleared with antibiotics at that time. She is in no distress. She tells me that her gyn. Rx Doxycyline, and she has been taking that since this Mon.

## 2021-01-05 LAB — URINE CULTURE

## 2021-01-05 NOTE — ED Provider Notes (Signed)
[facility] EMERGENCY DEPT Provider Note   CSN: [id number] Arrival date & time: 01/04/21  1043     History Chief Complaint  Patient presents with   Abscess    Lori Shea is a 45 y.o. female.  HPI     46yo female with history of hypertension, DM, PCOS, asthma, presents with concern for abscess.   Has area to right chest wall with pain, tenderness. Hx of prior MRSA in the past.  Saw OBGN on Monday and rx doxycycline.  Area with severe tenderness.  Felt a cyst in the area previously but became red and painful over last 3 days.  Feels slightly short of breath, feels like asthma that is exacerbated whenever she gets sick. No fevers, nausea, vomiting, abdominal pain. No other cough/chest pain. No hx of prior draiange of abscess. Glucose has been running high in 250s   Past Medical History:  Diagnosis Date   Allergy    Foods:  mango, honey, corn.  Allergic to "everything tested"  15 years ago in [place]   Anemia    with pregnancy   Arthritis    Shoulder and hips   Asthma age 66 yo   Triggers:  cold weather, exercise, allergens, anxiety, URIs   Cholelithiasis age 51 yo   Asymptomatic   COVID-19    Diabetes mellitus 2012   Was diagnosed 2 years earlier with PCOS   Elevated liver enzymes 09/13/2016   GERD (gastroesophageal reflux disease)    Heart murmur    with pregnancy   History of kidney stones    Hypertension 2015   Kidney stones    family history of renal failure   Migraines 2009   Morbid obesity ([place])    PCOS (polycystic ovarian syndrome) 2010   Pneumonia     Patient Active Problem List   Diagnosis Date Noted   Stress incontinence of urine 01/02/2021   Well woman exam with routine gynecological exam 01/02/2021   Skin irritation 01/02/2021   Hepatic steatosis 12/01/2019   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/2019   Hemoglobin A1C greater than 9%, indicating poor diabetic control 03/23/2019   Hyperglycemia 03/23/2019   Class 3  severe obesity due to excess calories with serious comorbidity and body mass index (BMI) of 40.0 to 44.9 in adult ([place]) 03/23/2019   Yeast infection 03/23/2019   BMI 40.0-44.9, adult ([place]) 03/09/2019   Cervical dysplasia 07/31/2017   Alopecia of scalp 05/09/2017   Pain in left toe(s) 12/27/2016   Closed avulsion fracture of lateral malleolus of right fibula 12/27/2016   Morbid obesity ([place]) 09/13/2016   Elevated liver enzymes 09/13/2016   Allergy    Asthma    Hypertension 04/23/2013   Type 2 diabetes mellitus with hyperglycemia, with long-term current use of insulin ([place]) 04/23/2010   PCOS (polycystic ovarian syndrome) 04/23/2008   Migraines 04/24/2007    Past Surgical History:  Procedure Laterality Date   CHOLECYSTECTOMY N/A 12/01/2019   Procedure: LAPAROSCOPIC CHOLECYSTECTOMY;  Surgeon: Armandina Gemma, MD;  Location: [facility];  Service: General;  Laterality: N/A;   COLPOSCOPY N/A    LAPAROSCOPIC TUBAL LIGATION Bilateral 01/28/2019   Procedure: LAPAROSCOPIC TUBAL LIGATION AND PAP SMEAR;  Surgeon: Aletha Halim, MD;  Location: [facility];  Service: Gynecology;  Laterality: Bilateral;   TONSILLECTOMY AND ADENOIDECTOMY Bilateral 12/23/2018   Procedure: TONSILLECTOMY AND ADENOIDECTOMY;  Surgeon: Leta Baptist, MD;  Location: [facility];  Service: ENT;  Laterality: Bilateral;     OB History  Gravida  4   Para  1   Term  1   Preterm      AB  3   Living  1      SAB  3   IAB      Ectopic      Multiple      Live Births              Family History  Problem Relation Age of Onset   Diabetes Father    Hyperlipidemia Father    Hypertension Father    Heart disease Father    Kidney disease Father        Developed after 2nd CABG   Diabetes Paternal Grandfather    Heart disease Paternal Grandfather    Hyperlipidemia Paternal Grandfather    Hypertension Paternal Grandfather    Kidney disease Paternal Grandfather     Social History    Tobacco Use   Smoking status: Never   Smokeless tobacco: Never  Vaping Use   Vaping Use: Never used  Substance Use Topics   Alcohol use: No   Drug use: No    Home Medications Prior to Admission medications   Medication Sig Start Date End Date Taking? Authorizing Provider  amitriptyline (ELAVIL) 25 MG tablet TAKE 1 TABLET BY MOUTH NIGHTLY AT BEDTIME 12/28/20  Yes King, Diona Foley, NP  cetirizine (ZYRTEC) 10 MG tablet Take 1 tablet (10 mg total) by mouth daily. 09/18/19  Yes Vevelyn Francois, NP  clindamycin (CLEOCIN) 150 MG capsule Take 2 capsules (300 mg total) by mouth every 6 (six) hours for 10 days. 01/04/21 01/14/21 Yes Gareth Morgan, MD  doxycycline (VIBRAMYCIN) 100 MG capsule Take 1 capsule (100 mg total) by mouth 2 (two) times daily for 7 days. 01/02/21 01/09/21 Yes Aletha Halim, MD  esomeprazole (NEXIUM) 20 MG capsule Take 1 capsule (20 mg total) by mouth at bedtime. 06/24/20 06/24/21 Yes King, Diona Foley, NP  fluticasone-salmeterol (ADVAIR DISKUS) 100-50 MCG/ACT AEPB Inhale 1 puff into the lungs 2 (two) times daily. 12/28/20  Yes King, Diona Foley, NP  insulin aspart (NOVOLOG FLEXPEN) 100 UNIT/ML FlexPen Inject 10-14 Units into the skin 3 (three) times daily before meals. 12/28/20  Yes King, Diona Foley, NP  insulin glargine (LANTUS SOLOSTAR) 100 UNIT/ML Solostar Pen Inject 40 Units into the skin 2 (two) times daily. 12/28/20 12/28/21 Yes King, Diona Foley, NP  MAGNESIUM CITRATE PO Take 1 capsule by mouth every morning.    Yes [provider]  meloxicam (MOBIC) 15 MG tablet Take 1 tablet (15 mg total) by mouth daily. 12/28/20 12/28/21 Yes Vevelyn Francois, NP  metFORMIN (GLUCOPHAGE) 500 MG tablet Take 1 tablet (500 mg total) by mouth 2 (two) times daily with a meal. 12/28/20 12/28/21 Yes King, Diona Foley, NP  montelukast (SINGULAIR) 10 MG tablet Take 1 tablet (10 mg total) by mouth daily. 12/28/20 12/28/21 Yes King, Diona Foley, NP  olmesartan-hydrochlorothiazide (BENICAR HCT) 40-25 MG tablet Take 1 tablet  by mouth daily. 12/28/20 12/28/21 Yes Vevelyn Francois, NP  rizatriptan (MAXALT-MLT) 10 MG disintegrating tablet Take 1 tablet (10 mg total) by mouth as needed for migraine. May repeat in 2 hours if needed 12/28/20  Yes Vevelyn Francois, NP  simvastatin (ZOCOR) 5 MG tablet Take 1 tablet (5 mg total) by mouth at bedtime. 12/28/20 12/28/21 Yes King, Diona Foley, NP  albuterol (PROVENTIL) (2.5 MG/3ML) 0.083% nebulizer solution Take 3 mLs (2.5 mg total) by nebulization every 6 (six) hours as needed for wheezing  or shortness of breath. 09/12/20   Vevelyn Francois, NP  albuterol (VENTOLIN HFA) 108 (90 Base) MCG/ACT inhaler INHALE 2 PUFFS INTO THE LUNGS EVERY 4 HOURS AS NEEDED FOR WHEEZING OR SHORTNESS OF BREATH. 12/28/20   Vevelyn Francois, NP  cyclobenzaprine (FLEXERIL) 5 MG tablet Take 1 tablet (5 mg total) by mouth 3 (three) times daily as needed for muscle spasms. Patient not taking: No sig reported 12/28/20   Vevelyn Francois, NP  EASY COMFORT PEN NEEDLES 32G X 4 MM MISC use as directed test 2 times daily 05/14/17   [provider]  ibuprofen (ADVIL) 800 MG tablet Take 1 tablet (800 mg total) by mouth 3 (three) times daily. Patient not taking: Reported on 01/04/2021 11/02/19   Drenda Freeze, MD  Insulin Pen Needle (PEN NEEDLES) 32G X 5 MM MISC 100 each by Does not apply route 2 (two) times daily. 05/14/17   Dorena Dew, FNP  medroxyPROGESTERone (PROVERA) 10 MG tablet 1 tab po qday starting on the first day of the month and for the next 14 days. You should get a period after that. Repeat monthly. 01/02/21   Aletha Halim, MD    Allergies    Honey, Mango flavor, Glipizide, and Liraglutide  Review of Systems   Review of Systems  Constitutional:  Negative for fever.  Respiratory:  Positive for shortness of breath. Negative for cough.   Cardiovascular:  Negative for chest pain (with exception of area of chestwall pain).  Gastrointestinal:  Positive for abdominal pain. Negative for nausea and vomiting.   Skin:  Positive for rash.  Neurological:  Negative for headaches.   Physical Exam Updated Vital Signs BP 123/86 (BP Location: Left Arm)   Pulse 69   Temp 98.3 F (36.8 C) (Oral)   Resp 16   Ht '5\' 5"'$  (1.651 m)   Wt 128.4 kg   LMP 12/13/2020 (Exact Date)   SpO2 100%   BMI 47.09 kg/m   Physical Exam Vitals and nursing note reviewed.  Constitutional:      General: She is not in acute distress.    Appearance: She is well-developed. She is not diaphoretic.  HENT:     Head: Normocephalic and atraumatic.  Eyes:     Conjunctiva/sclera: Conjunctivae normal.  Cardiovascular:     Rate and Rhythm: Normal rate and regular rhythm.     Heart sounds: Normal heart sounds. No murmur heard.   No friction rub. No gallop.  Pulmonary:     Effort: Pulmonary effort is normal. No respiratory distress.     Breath sounds: Normal breath sounds. No wheezing or rales.  Chest:     Chest wall: Tenderness (right superior chest wall with area of erythema, induration, fluctuance) present.  Abdominal:     General: There is no distension.     Palpations: Abdomen is soft.     Tenderness: There is no abdominal tenderness. There is no guarding.  Musculoskeletal:        General: No tenderness.     Cervical back: Normal range of motion.  Skin:    General: Skin is warm and dry.     Findings: Erythema (area 2-3cm of erythema, induration, fluctuance right chest wall) present. No rash.  Neurological:     Mental Status: She is alert and oriented to person, place, and time.    ED Results / Procedures / Treatments   Labs (all labs ordered are listed, but only abnormal results are displayed) Labs Reviewed  URINALYSIS, ROUTINE  W REFLEX MICROSCOPIC - Abnormal; Notable for the following components:      Result Value   Glucose, UA >1,000 (*)    Hgb urine dipstick LARGE (*)    Ketones, ur TRACE (*)    RBC / HPF >50 (*)    Bacteria, UA FEW (*)    All other components within normal limits  COMPREHENSIVE  METABOLIC PANEL - Abnormal; Notable for the following components:   Glucose, Bld 283 (*)    All other components within normal limits  I-STAT VENOUS BLOOD GAS, ED - Abnormal; Notable for the following components:   pCO2, Ven 40.0 (*)    All other components within normal limits  CBG MONITORING, ED - Abnormal; Notable for the following components:   Glucose-Capillary 255 (*)    All other components within normal limits  PREGNANCY, URINE  CBC WITH DIFFERENTIAL/PLATELET    EKG None  Radiology No results found.  Procedures .Marland KitchenIncision and Drainage  Date/Time: 01/05/2021 7:00 AM Performed by: Gareth Morgan, MD Authorized by: Gareth Morgan, MD   Consent:    Consent obtained:  Verbal   Consent given by:  Patient   Risks, benefits, and alternatives were discussed: yes     Risks discussed:  Bleeding, incomplete drainage, pain, damage to other organs and infection   Alternatives discussed:  No treatment Universal protocol:    Immediately prior to procedure, a time out was called: yes     Patient identity confirmed:  Verbally with patient Location:    Type:  Cyst   Size:  2   Location:  Trunk   Trunk location:  Chest Procedure type:    Complexity:  Simple Procedure details:    Incision types:  Single straight   Wound management:  Probed and deloculated and irrigated with saline   Drainage:  Purulent   Drainage amount:  Copious   Wound treatment:  Wound left open   Packing materials:  1/2 in iodoform gauze Post-procedure details:    Procedure completion:  Tolerated well, no immediate complications   Medications Ordered in ED Medications  lidocaine-EPINEPHrine (XYLOCAINE W/EPI) 2 %-1:100000 (with pres) injection 20 mL (20 mLs Intradermal Given 01/04/21 1326)  lidocaine-EPINEPHrine (XYLOCAINE W/EPI) 2 %-1:200000 (PF) injection (20 mLs  Given by Other 01/04/21 1239)  fentaNYL (SUBLIMAZE) injection 50 mcg (50 mcg Intramuscular Given 01/04/21 1239)  promethazine (PHENERGAN)  tablet 25 mg (25 mg Oral Given 01/04/21 1239)  clindamycin (CLEOCIN) capsule 300 mg (300 mg Oral Given 01/04/21 1325)    ED Course  I have reviewed the triage vital signs and the nursing notes.  Pertinent labs & imaging results that were available during my care of the patient were reviewed by me and considered in my medical decision making (see chart for details).    MDM Rules/Calculators/A&P                            45yo female with history of hypertension, DM, PCOS, asthma, presents with concern for abscess.  Also reports dyspnea---urine with ketonuria, labs obtained to evaluate for signs of DKA in setting of dyspnea. Labs show no sign of DKA. Reports her asthma does worsen when she is sick however this is mild.  Biggest concern today abscess---incision and drainage performed with copius purulence.  No sign of sepsis .  Stable for outpatient management/antibiotics now that she has had drainage. Recommend 48hr wound check.  Initiated clindamycin for 10 days. Patient discharged in stable condition with  understanding of reasons to return.   Final Clinical Impression(s) / ED Diagnoses Final diagnoses:  Abscess  Ketonuria  Hyperglycemia    Rx / DC Orders ED Discharge Orders          Ordered    clindamycin (CLEOCIN) 150 MG capsule  Every 6 hours        01/04/21 1358             Gareth Morgan, MD 01/05/21 [phone]

## 2021-01-07 ENCOUNTER — Ambulatory Visit (HOSPITAL_COMMUNITY): Payer: 59

## 2021-01-07 ENCOUNTER — Other Ambulatory Visit: Payer: Self-pay

## 2021-01-07 ENCOUNTER — Encounter (HOSPITAL_COMMUNITY): Payer: Self-pay | Admitting: *Deleted

## 2021-01-07 ENCOUNTER — Ambulatory Visit (HOSPITAL_COMMUNITY)
Admission: EM | Admit: 2021-01-07 | Discharge: 2021-01-07 | Disposition: A | Discharge status: Home / Self Care | Payer: 59 | Attending: Urgent Care | Admitting: Urgent Care

## 2021-01-07 DIAGNOSIS — Z794 Long term (current) use of insulin: Secondary | ICD-10-CM

## 2021-01-07 DIAGNOSIS — Z5189 Encounter for other specified aftercare: Secondary | ICD-10-CM

## 2021-01-07 DIAGNOSIS — E119 Type 2 diabetes mellitus without complications: Secondary | ICD-10-CM

## 2021-01-07 DIAGNOSIS — L729 Follicular cyst of the skin and subcutaneous tissue, unspecified: Secondary | ICD-10-CM

## 2021-01-07 DIAGNOSIS — E11628 Type 2 diabetes mellitus with other skin complications: Secondary | ICD-10-CM | POA: Diagnosis not present

## 2021-01-07 DIAGNOSIS — L089 Local infection of the skin and subcutaneous tissue, unspecified: Secondary | ICD-10-CM

## 2021-01-07 NOTE — ED Triage Notes (Signed)
Pt was seen in ED 01/04/2021 for chest abscess I&D; was told to go to [facility] or ED to have wound cleaned and re-packed.  Pt reports purulent drainage.  Pt states she feels he is doing much better.

## 2021-01-07 NOTE — Discharge Instructions (Addendum)
Please change your dressing 3-5 times daily. Do not apply any ointments or creams. Each time you change your dressing, make sure that you are pressing on the wound to get pus to come out.  Try your best to have a family member help you clean gently around the perimeter of the wound with gentle soap and warm water. Pat your wound dry and let it air out if possible to make sure it is dry before reapplying another dressing. Do not use any nonsteroidal anti-inflammatories (NSAIDs) like ibuprofen, Motrin, naproxen, Aleve, etc. which are all available over-the-counter.  Please just use Tylenol at a dose of '500mg'$ -'650mg'$  once every 6 hours as needed for your aches, pains, fevers. Make sure you finish the antibiotic.

## 2021-01-07 NOTE — ED Provider Notes (Signed)
[facility]   MRN: [id number] DOB: [birth year]  Subjective:   Lori Shea is a 45 y.o. female presenting for wound check. Had an incision and drainage on 01/04/2021. Was considered for admission but was cancelled as she was no in DKA and did not have sepsis. She was started on clindamycin at '300mg'$  every 6 hours for infection of her infected cyst.  Patient states that she has had this as long as she can remember.  States that his wax and wane in terms of size and pain.  She has never seen a dermatologist about it.  Actually reports that her symptoms are improving.  They have been getting some drainage.  She is doing dressing changes consistently and has her daughters help.  No current facility-administered medications for this encounter.  Current Outpatient Medications:    albuterol (PROVENTIL) (2.5 MG/3ML) 0.083% nebulizer solution, Take 3 mLs (2.5 mg total) by nebulization every 6 (six) hours as needed for wheezing or shortness of breath., Disp: 75 mL, Rfl: 6   albuterol (VENTOLIN HFA) 108 (90 Base) MCG/ACT inhaler, INHALE 2 PUFFS INTO THE LUNGS EVERY 4 HOURS AS NEEDED FOR WHEEZING OR SHORTNESS OF BREATH., Disp: 1 each, Rfl: 0   amitriptyline (ELAVIL) 25 MG tablet, TAKE 1 TABLET BY MOUTH NIGHTLY AT BEDTIME, Disp: 30 tablet, Rfl: 11   cetirizine (ZYRTEC) 10 MG tablet, Take 1 tablet (10 mg total) by mouth daily., Disp: 30 tablet, Rfl: 11   clindamycin (CLEOCIN) 150 MG capsule, Take 2 capsules (300 mg total) by mouth every 6 (six) hours for 10 days., Disp: 80 capsule, Rfl: 0   cyclobenzaprine (FLEXERIL) 5 MG tablet, Take 1 tablet (5 mg total) by mouth 3 (three) times daily as needed for muscle spasms. (Patient not taking: No sig reported), Disp: 10 tablet, Rfl: 0   doxycycline (VIBRAMYCIN) 100 MG capsule, Take 1 capsule (100 mg total) by mouth 2 (two) times daily for 7 days., Disp: 14 capsule, Rfl: 0   EASY COMFORT PEN NEEDLES 32G X 4 MM MISC, use as directed test 2 times  daily, Disp: , Rfl: 3   esomeprazole (NEXIUM) 20 MG capsule, Take 1 capsule (20 mg total) by mouth at bedtime., Disp: 90 capsule, Rfl: 3   fluticasone-salmeterol (ADVAIR DISKUS) 100-50 MCG/ACT AEPB, Inhale 1 puff into the lungs 2 (two) times daily., Disp: 60 each, Rfl: 11   ibuprofen (ADVIL) 800 MG tablet, Take 1 tablet (800 mg total) by mouth 3 (three) times daily. (Patient not taking: Reported on 01/04/2021), Disp: 21 tablet, Rfl: 0   insulin aspart (NOVOLOG FLEXPEN) 100 UNIT/ML FlexPen, Inject 10-14 Units into the skin 3 (three) times daily before meals., Disp: 30 mL, Rfl: 11   insulin glargine (LANTUS SOLOSTAR) 100 UNIT/ML Solostar Pen, Inject 40 Units into the skin 2 (two) times daily., Disp: 24 mL, Rfl: 11   Insulin Pen Needle (PEN NEEDLES) 32G X 5 MM MISC, 100 each by Does not apply route 2 (two) times daily., Disp: 100 each, Rfl: 3   MAGNESIUM CITRATE PO, Take 1 capsule by mouth every morning. , Disp: , Rfl:    medroxyPROGESTERone (PROVERA) 10 MG tablet, 1 tab po qday starting on the first day of the month and for the next 14 days. You should get a period after that. Repeat monthly., Disp: 42 tablet, Rfl: 3   meloxicam (MOBIC) 15 MG tablet, Take 1 tablet (15 mg total) by mouth daily., Disp: 30 tablet, Rfl: 11   metFORMIN (GLUCOPHAGE) 500  MG tablet, Take 1 tablet (500 mg total) by mouth 2 (two) times daily with a meal., Disp: 60 tablet, Rfl: 11   montelukast (SINGULAIR) 10 MG tablet, Take 1 tablet (10 mg total) by mouth daily., Disp: 30 tablet, Rfl: 11   olmesartan-hydrochlorothiazide (BENICAR HCT) 40-25 MG tablet, Take 1 tablet by mouth daily., Disp: 90 tablet, Rfl: 3   rizatriptan (MAXALT-MLT) 10 MG disintegrating tablet, Take 1 tablet (10 mg total) by mouth as needed for migraine. May repeat in 2 hours if needed, Disp: 12 tablet, Rfl: 0   simvastatin (ZOCOR) 5 MG tablet, Take 1 tablet (5 mg total) by mouth at bedtime., Disp: 30 tablet, Rfl: 11   Allergies  Allergen Reactions   Honey  Shortness Of Breath   Mango Flavor Hives and Swelling    Pt allergic to all mango - tongue and throat swelling   Glipizide     Other reaction(s): Other   Liraglutide Nausea And Vomiting    Past Medical History:  Diagnosis Date   Allergy    Foods:  mango, honey, corn.  Allergic to "everything tested"  15 years ago in [place]   Anemia    with pregnancy   Arthritis    Shoulder and hips   Asthma age 17 yo   Triggers:  cold weather, exercise, allergens, anxiety, URIs   Cholelithiasis age 20 yo   Asymptomatic   COVID-19    Diabetes mellitus 2012   Was diagnosed 2 years earlier with PCOS   Elevated liver enzymes 09/13/2016   GERD (gastroesophageal reflux disease)    Heart murmur    with pregnancy   History of kidney stones    Hypertension 2015   Kidney stones    family history of renal failure   Migraines 2009   Morbid obesity ([place])    PCOS (polycystic ovarian syndrome) 2010   Pneumonia      Past Surgical History:  Procedure Laterality Date   CHOLECYSTECTOMY N/A 12/01/2019   Procedure: LAPAROSCOPIC CHOLECYSTECTOMY;  Surgeon: Armandina Gemma, MD;  Location: [facility];  Service: General;  Laterality: N/A;   COLPOSCOPY N/A    LAPAROSCOPIC TUBAL LIGATION Bilateral 01/28/2019   Procedure: LAPAROSCOPIC TUBAL LIGATION AND PAP SMEAR;  Surgeon: Aletha Halim, MD;  Location: [facility];  Service: Gynecology;  Laterality: Bilateral;   TONSILLECTOMY AND ADENOIDECTOMY Bilateral 12/23/2018   Procedure: TONSILLECTOMY AND ADENOIDECTOMY;  Surgeon: Leta Baptist, MD;  Location: [facility];  Service: ENT;  Laterality: Bilateral;    Family History  Problem Relation Age of Onset   Diabetes Father    Hyperlipidemia Father    Hypertension Father    Heart disease Father    Kidney disease Father        Developed after 2nd CABG   Diabetes Paternal Grandfather    Heart disease Paternal Grandfather    Hyperlipidemia Paternal Grandfather    Hypertension Paternal Grandfather     Kidney disease Paternal Grandfather     Social History   Tobacco Use   Smoking status: Never   Smokeless tobacco: Never  Vaping Use   Vaping Use: Never used  Substance Use Topics   Alcohol use: No   Drug use: No    ROS   Objective:   Vitals: BP (!) 134/94   Pulse 91   Temp 98.1 F (36.7 C) (Oral)   Resp 20   LMP 12/13/2020 (Exact Date)   SpO2 96%   Physical Exam Constitutional:      General: She  is not in acute distress.    Appearance: Normal appearance. She is well-developed. She is obese. She is not ill-appearing, toxic-appearing or diaphoretic.  HENT:     Head: Normocephalic and atraumatic.     Nose: Nose normal.     Mouth/Throat:     Mouth: Mucous membranes are moist.     Pharynx: Oropharynx is clear.  Eyes:     General: No scleral icterus.       Right eye: No discharge.        Left eye: No discharge.     Extraocular Movements: Extraocular movements intact.     Conjunctiva/sclera: Conjunctivae normal.     Pupils: Pupils are equal, round, and reactive to light.  Cardiovascular:     Rate and Rhythm: Normal rate.  Pulmonary:     Effort: Pulmonary effort is normal.  Chest:    Skin:    General: Skin is warm and dry.  Neurological:     General: No focal deficit present.     Mental Status: She is alert and oriented to person, place, and time.  Psychiatric:        Mood and Affect: Mood normal.        Behavior: Behavior normal.        Thought Content: Thought content normal.        Judgment: Judgment normal.    A make sure of large sebaceous material, purulence was expressed.  Wound cleansed and dressed.  Assessment and Plan :   PDMP not reviewed this encounter.  1. Visit for wound check   2. Infected cyst of skin   3. Type 2 diabetes mellitus treated with insulin (HCC)     No additional packing necessary.  Reviewed wound care with her.  Finish antibiotic course.  Follow-up with dermatology.  I attempted a referral but advised patient that she  may need one from her PCP.  Schedule Tylenol for pain. Counseled patient on potential for adverse effects with medications prescribed/recommended today, ER and return-to-clinic precautions discussed, patient verbalized understanding.    Jaynee Eagles, PA-C 01/07/21 1650

## 2021-01-10 LAB — CYTOLOGY - PAP
Comment: NEGATIVE
Diagnosis: NEGATIVE
High risk HPV: NEGATIVE

## 2021-01-11 ENCOUNTER — Encounter: Payer: Self-pay | Admitting: Obstetrics and Gynecology

## 2021-01-11 ENCOUNTER — Ambulatory Visit (INDEPENDENT_AMBULATORY_CARE_PROVIDER_SITE_OTHER): Payer: 59 | Admitting: Nurse Practitioner

## 2021-01-11 VITALS — BP 126/70 | HR 85 | Ht 65.0 in | Wt 281.4 lb

## 2021-01-11 DIAGNOSIS — K219 Gastro-esophageal reflux disease without esophagitis: Secondary | ICD-10-CM

## 2021-01-11 DIAGNOSIS — R197 Diarrhea, unspecified: Secondary | ICD-10-CM

## 2021-01-11 DIAGNOSIS — Z1211 Encounter for screening for malignant neoplasm of colon: Secondary | ICD-10-CM | POA: Diagnosis not present

## 2021-01-11 MED ORDER — PEG 3350-KCL-NA BICARB-NACL 420 G PO SOLR: 4000.0000 mL | Freq: Once | ORAL | 0 refills | Status: AC

## 2021-01-11 MED ORDER — CHOLESTYRAMINE 4 G PO PACK: 4.0000 g | PACK | ORAL | 0 refills | Status: DC | PRN

## 2021-01-11 NOTE — Progress Notes (Signed)
Attending Physician's Attestation   I have reviewed the chart.   I agree with the Advanced Practitioner's note, impression, and recommendations with any updates as below.    Winda Summerall Mansouraty, MD [facility] Office # [phone]  

## 2021-01-11 NOTE — Patient Instructions (Signed)
If you are age 46 or younger, your body mass index should be between 19-25. Your Body mass index is 46.82 kg/m. If this is out of the aformentioned range listed, please consider follow up with your Primary Care Provider.   The [facility] providers would like to encourage you to use [facility] to communicate with providers for non-urgent requests or questions.  Due to long hold times on the telephone, sending your provider a message by [facility] may be faster and more efficient way to get a response. Please allow 48 business hours for a response.  Please remember that this is for non-urgent requests/questions.  PROCEDURES: You have been scheduled for an EGD and Colonoscopy. Please follow the written instructions given to you at your visit today. Please pick up your prep supplies at the pharmacy within the next 1-3 days. If you use inhalers (even only as needed), please bring them with you on the day of your procedure.  MEDICATION: We have sent the following medication to your pharmacy for you to pick up at your convenience: Cholestyramine 4 gram packet, mix one packet in 4oz water and drink daily. Do not take within 4 hours of any other medication.   It was great seeing you today! Thank you for entrusting me with your care and choosing [facility].  Noralyn Pick, CRNP

## 2021-01-11 NOTE — Progress Notes (Signed)
01/11/2021 HANNAN TETZLAFF [id number] [birth year]   CHIEF COMPLAINT: Diarrhea, fatty liver  HISTORY OF PRESENT ILLNESS:  Kaicee Scarpino. Manninen is a 45 year old female with a past medical history of asthma, diabetes mellitus type 2, PCOS, kidney stones, obesity and GERD.  Past tonsillectomy, tubal ligation and cholecystectomy.  She presents to our office today as referred by Dionisio David NP for further evaluation regarding diarrhea and hepatic steatosis.  She underwent a cholecystectomy 11/2019 and she stated her surgeon was concerned about the appearance of her liver and recommended further follow-up.  She also developed diarrhea shortly after her cholecystectomy which persisted for 3 to 4 months then significantly improved.  She describes having nonbloody muddy to watery diarrhea 3-4 times with urgency 1 day weekly.  She sometimes soils herself before she gets to the bathroom which is quite bothersome.  No specific food or stress triggers.  She is able to pass a normal formed brown bowel movement 6 days weekly.  She denies ever having a screening colonoscopy.  She is attempting to lose weight by diet modification and exercise.  She is avoiding fatty foods, she is eating more vegetables and he has reduced her carbohydrate intake.  She is drinking one protein shake once daily.  She was referred to the [facility] health weight and wellness management program and she is waiting for an appointment.  She reports having history of GERD.  She is taking Esomeprazole 20 mg daily for several years, she reports taking an acid reducing medication for the past 15 years.  She denies ever having an EGD.  If she skips Esomeprazole for 3 to 4 days she develops significant heartburn and stomach burning.  No dysphagia.  She takes Meloxicam 15 mg p.o. daily.  She has a upper chest cyst which was drained in the ED on 01/04/2021 when she was prescribed Clindamycin 300 mg 3 times daily for 10 days.  Her diarrhea has not worsened since  starting Clindamycin.  She is taking a prebiotic and a probiotic.  She is on Metformin without recent dose change.  CBC Latest Ref Rng & Units 01/04/2021 01/04/2021 06/24/2020  WBC 4.0 - 10.5 K/uL - 9.0 9.1  Hemoglobin 12.0 - 15.0 g/dL 12.2 12.5 12.9  Hematocrit 36.0 - 46.0 % 36.0 36.3 39.2  Platelets 150 - 400 K/uL - 260 267    CMP Latest Ref Rng & Units 01/04/2021 01/04/2021 12/28/2020  Glucose 70 - 99 mg/dL - 283(H) 210(H)  BUN 6 - 20 mg/dL - 14 13  Creatinine 0.44 - 1.00 mg/dL - 0.59 0.72  Sodium 135 - 145 mmol/L 138 137 138  Potassium 3.5 - 5.1 mmol/L 3.7 3.7 3.8  Chloride 98 - 111 mmol/L - 104 101  CO2 22 - 32 mmol/L - 24 -  Calcium 8.9 - 10.3 mg/dL - 9.0 8.9  Total Protein 6.5 - 8.1 g/dL - 6.5 6.4  Total Bilirubin 0.3 - 1.2 mg/dL - 0.4 0.4  Alkaline Phos 38 - 126 U/L - 93 94  AST 15 - 41 U/L - 21 27  ALT 0 - 44 U/L - 33 -     Past Medical History:  Diagnosis Date   Allergy    Foods:  mango, honey, corn.  Allergic to "everything tested"  15 years ago in [place]   Anemia    with pregnancy   Arthritis    Shoulder and hips   Asthma age 63 yo   Triggers:  cold weather, exercise, allergens, anxiety, URIs  Cholelithiasis age 71 yo   Asymptomatic   Closed avulsion fracture of lateral malleolus of right fibula 12/27/2016   COVID-19    Diabetes mellitus 2012   Was diagnosed 2 years earlier with PCOS   Elevated liver enzymes 09/13/2016   GERD (gastroesophageal reflux disease)    Heart murmur    with pregnancy   History of kidney stones    Hypertension 2015   Kidney stones    family history of renal failure   Migraines 2009   Morbid obesity ([place])    PCOS (polycystic ovarian syndrome) 2010   Pneumonia    Past Surgical History:  Procedure Laterality Date   CHOLECYSTECTOMY N/A 12/01/2019   Procedure: LAPAROSCOPIC CHOLECYSTECTOMY;  Surgeon: Armandina Gemma, MD;  Location: [facility];  Service: General;  Laterality: N/A;   COLPOSCOPY N/A    LAPAROSCOPIC TUBAL LIGATION Bilateral 01/28/2019    Procedure: LAPAROSCOPIC TUBAL LIGATION AND PAP SMEAR;  Surgeon: Aletha Halim, MD;  Location: [place];  Service: Gynecology;  Laterality: Bilateral;   TONSILLECTOMY AND ADENOIDECTOMY Bilateral 12/23/2018   Procedure: TONSILLECTOMY AND ADENOIDECTOMY;  Surgeon: Leta Baptist, MD;  Location: [place];  Service: ENT;  Laterality: Bilateral;   Social History: She is a CNA.  She is divorced.  She has 1 daughter.  Nonsmoker. No alcohol or drug use.   Family History: Father died age 63 massive MI, history of DM, renal failure and colon polyps.  Mother age 97 healthy. No family history of esophageal, gastric or colon cancer.   Allergies  Allergen Reactions   Honey Shortness Of Breath   Mango Flavor Hives and Swelling    Pt allergic to all mango - tongue and throat swelling   Glipizide     Other reaction(s): Other   Liraglutide Nausea And Vomiting      Outpatient Encounter Medications as of 01/11/2021  Medication Sig   albuterol (PROVENTIL) (2.5 MG/3ML) 0.083% nebulizer solution Take 3 mLs (2.5 mg total) by nebulization every 6 (six) hours as needed for wheezing or shortness of breath.   albuterol (VENTOLIN HFA) 108 (90 Base) MCG/ACT inhaler INHALE 2 PUFFS INTO THE LUNGS EVERY 4 HOURS AS NEEDED FOR WHEEZING OR SHORTNESS OF BREATH.   amitriptyline (ELAVIL) 25 MG tablet TAKE 1 TABLET BY MOUTH NIGHTLY AT BEDTIME   cetirizine (ZYRTEC) 10 MG tablet Take 1 tablet (10 mg total) by mouth daily.   clindamycin (CLEOCIN) 150 MG capsule Take 2 capsules (300 mg total) by mouth every 6 (six) hours for 10 days.   cyclobenzaprine (FLEXERIL) 5 MG tablet Take 1 tablet (5 mg total) by mouth 3 (three) times daily as needed for muscle spasms.   EASY COMFORT PEN NEEDLES 32G X 4 MM MISC use as directed test 2 times daily   esomeprazole (NEXIUM) 20 MG capsule Take 1 capsule (20 mg total) by mouth at bedtime.   fluticasone-salmeterol (ADVAIR DISKUS) 100-50 MCG/ACT AEPB Inhale 1 puff into  the lungs 2 (two) times daily.   ibuprofen (ADVIL) 800 MG tablet Take 1 tablet (800 mg total) by mouth 3 (three) times daily.   insulin aspart (NOVOLOG FLEXPEN) 100 UNIT/ML FlexPen Inject 10-14 Units into the skin 3 (three) times daily before meals.   insulin glargine (LANTUS SOLOSTAR) 100 UNIT/ML Solostar Pen Inject 40 Units into the skin 2 (two) times daily.   Insulin Pen Needle (PEN NEEDLES) 32G X 5 MM MISC 100 each by Does not apply route 2 (two) times daily.   MAGNESIUM CITRATE PO Take  1 capsule by mouth every morning.    medroxyPROGESTERone (PROVERA) 10 MG tablet 1 tab po qday starting on the first day of the month and for the next 14 days. You should get a period after that. Repeat monthly.   meloxicam (MOBIC) 15 MG tablet Take 1 tablet (15 mg total) by mouth daily.   metFORMIN (GLUCOPHAGE) 500 MG tablet Take 1 tablet (500 mg total) by mouth 2 (two) times daily with a meal.   montelukast (SINGULAIR) 10 MG tablet Take 1 tablet (10 mg total) by mouth daily.   olmesartan-hydrochlorothiazide (BENICAR HCT) 40-25 MG tablet Take 1 tablet by mouth daily.   rizatriptan (MAXALT-MLT) 10 MG disintegrating tablet Take 1 tablet (10 mg total) by mouth as needed for migraine. May repeat in 2 hours if needed   simvastatin (ZOCOR) 5 MG tablet Take 1 tablet (5 mg total) by mouth at bedtime.   No facility-administered encounter medications on file as of 01/11/2021.    REVIEW OF SYSTEMS:  Gen: + Night sweats or chills. No weight loss.  CV: Denies chest pain, palpitations or edema. Resp: + SOB. No cough or hemoptysis.  GI: See HPI.   GU : Denies urinary burning, blood in urine, increased urinary frequency or incontinence. MS: Denies joint pain, muscles aches or weakness. Derm: Denies rash, itchiness, skin lesions or unhealing ulcers. Psych: Denies depression, anxiety, memory loss, suicidal ideation and confusion. Heme: Denies bruising, bleeding. Neuro:  + Headaches.  Endo:  Denies any problems with DM,  thyroid or adrenal function.   PHYSICAL EXAM: BP 126/70   Pulse 85   Ht 5\' 5"  (1.651 m)   Wt 281 lb 6 oz (127.6 kg)   LMP 12/13/2020 (Exact Date)   BMI 46.82 kg/m  General: 45 year old female in no acute distress. Head: Normocephalic and atraumatic. Eyes:  Sclerae non-icteric, conjunctive pink. Ears: Normal auditory acuity. Mouth: Dentition intact. No ulcers or lesions.  Neck: Supple, no lymphadenopathy or thyromegaly.  Lungs: Clear bilaterally to auscultation without wheezes, crackles or rhonchi. Heart: Regular rate and rhythm. No murmur, rub or gallop appreciated.  Abdomen: Soft, non distended. Mild RUQ tenderness without rebound or guarding. No masses. No hepatosplenomegaly. Normoactive bowel sounds x 4 quadrants.  Rectal: Deferred.  Musculoskeletal: Symmetrical with no gross deformities. Skin: Warm and dry. No rash or lesions on visible extremities. Extremities: No edema. Neurological: Alert oriented x 4, no focal deficits.  Psychological:  Alert and cooperative. Normal mood and affect.  ASSESSMENT AND PLAN: 48) 45 year old female with postcholecystectomy diarrhea which has improved over the past few months but continues to have multiple episodes of nonbloody urgent diarrhea one day weekly.  She initially had generalized abdominal pain for few months following her cholecystectomy which resolved. -Probiotic of choice daily -Cholestyramine 4 g 1 packet mixed in 4 ounces of clear liquid daily as needed not to take within 4 hours of any other medication -Colonoscopy benefits and risks discussed including risk with sedation, risk of bleeding, perforation and infection   2) History of GERD, dependent on PPI.  On Meloxicam 15 mg p.o. daily. -EGD at time of colonoscopy  -Continue Esomeprazole 20 mg daily for now  3) Colon cancer screen. Father with history of colon polyps.  -Colonoscopy as ordered above  4) Hepatic steatosis. Normal LFTs. -Continue diet modification, exercise and  proceed with weight and wellness management referral as ordered by PCP -Follow-up in the office 2 to 3 weeks following EGD and colonoscopy to review procedure results but also to assess her  diet regimen and weight loss status -Consider scheduling a follow-up abdominal sonogram, to discuss further at the time of her follow-up appointment  5) Chest cyst status post I&D on Clindamycin 3 times daily for 10 days -Patient will call our office if diarrhea symptoms  6)  DM II        CC:  Vevelyn Francois, NP

## 2021-01-12 ENCOUNTER — Encounter: Payer: Self-pay | Admitting: General Practice

## 2021-01-13 ENCOUNTER — Other Ambulatory Visit: Payer: Self-pay | Admitting: Obstetrics and Gynecology

## 2021-01-13 DIAGNOSIS — Z1231 Encounter for screening mammogram for malignant neoplasm of breast: Secondary | ICD-10-CM

## 2021-01-13 DIAGNOSIS — R928 Other abnormal and inconclusive findings on diagnostic imaging of breast: Secondary | ICD-10-CM

## 2021-01-16 ENCOUNTER — Ambulatory Visit
Admission: RE | Admit: 2021-01-16 | Discharge: 2021-01-16 | Disposition: A | Discharge status: Home / Self Care | Payer: 59 | Source: Ambulatory Visit | Attending: Obstetrics and Gynecology | Admitting: Obstetrics and Gynecology

## 2021-01-16 ENCOUNTER — Other Ambulatory Visit: Payer: Self-pay | Admitting: Nurse Practitioner

## 2021-01-16 ENCOUNTER — Other Ambulatory Visit: Payer: Self-pay | Admitting: Obstetrics and Gynecology

## 2021-01-16 ENCOUNTER — Other Ambulatory Visit: Payer: Self-pay

## 2021-01-16 DIAGNOSIS — N6310 Unspecified lump in the right breast, unspecified quadrant: Secondary | ICD-10-CM

## 2021-01-16 DIAGNOSIS — M545 Low back pain, unspecified: Secondary | ICD-10-CM

## 2021-01-16 DIAGNOSIS — R928 Other abnormal and inconclusive findings on diagnostic imaging of breast: Secondary | ICD-10-CM

## 2021-01-20 ENCOUNTER — Other Ambulatory Visit: Payer: Self-pay | Admitting: Lactation Services

## 2021-01-20 MED ORDER — METRONIDAZOLE 500 MG PO TABS: 500.0000 mg | ORAL_TABLET | Freq: Two times a day (BID) | ORAL | 0 refills | Status: DC

## 2021-01-20 MED ORDER — FLUCONAZOLE 150 MG PO TABS: 150.0000 mg | ORAL_TABLET | Freq: Once | ORAL | 0 refills | Status: AC

## 2021-01-20 NOTE — Progress Notes (Signed)
Sent treatment for BV and yeast due to + vaginal infections. Patient informed via My Chart.

## 2021-01-21 ENCOUNTER — Encounter: Payer: Self-pay | Admitting: Radiology

## 2021-01-23 ENCOUNTER — Other Ambulatory Visit: Payer: 59

## 2021-01-24 ENCOUNTER — Other Ambulatory Visit: Payer: Self-pay

## 2021-01-24 ENCOUNTER — Other Ambulatory Visit: Payer: Self-pay | Admitting: Obstetrics and Gynecology

## 2021-01-24 ENCOUNTER — Other Ambulatory Visit: Payer: 59

## 2021-01-24 DIAGNOSIS — N611 Abscess of the breast and nipple: Secondary | ICD-10-CM

## 2021-01-24 NOTE — Progress Notes (Signed)
Orders placed per faxed order request from [facility].  Colletta Maryland, RN

## 2021-01-26 ENCOUNTER — Ambulatory Visit
Admission: RE | Admit: 2021-01-26 | Discharge: 2021-01-26 | Disposition: A | Discharge status: Home / Self Care | Payer: 59 | Source: Ambulatory Visit | Attending: Obstetrics and Gynecology | Admitting: Obstetrics and Gynecology

## 2021-01-26 ENCOUNTER — Other Ambulatory Visit: Payer: Self-pay | Admitting: Obstetrics and Gynecology

## 2021-01-26 ENCOUNTER — Other Ambulatory Visit: Payer: 59

## 2021-01-26 ENCOUNTER — Other Ambulatory Visit: Payer: Self-pay

## 2021-01-26 DIAGNOSIS — N611 Abscess of the breast and nipple: Secondary | ICD-10-CM

## 2021-02-09 ENCOUNTER — Other Ambulatory Visit: Payer: 59

## 2021-02-22 ENCOUNTER — Encounter: Payer: 59 | Admitting: Gastroenterology

## 2021-03-02 ENCOUNTER — Encounter: Payer: 59 | Admitting: Gastroenterology

## 2021-03-08 ENCOUNTER — Other Ambulatory Visit: Payer: Self-pay | Admitting: Nurse Practitioner

## 2021-03-23 ENCOUNTER — Ambulatory Visit (HOSPITAL_COMMUNITY): Admit: 2021-03-23 | Discharge status: Against Medical Advice | Payer: 59

## 2021-03-23 ENCOUNTER — Encounter: Payer: 59 | Admitting: Gastroenterology

## 2021-03-29 ENCOUNTER — Ambulatory Visit: Payer: Self-pay | Admitting: Nurse Practitioner

## 2021-03-31 ENCOUNTER — Encounter: Payer: Self-pay | Admitting: Obstetrics and Gynecology

## 2021-04-03 ENCOUNTER — Other Ambulatory Visit: Payer: Self-pay

## 2021-04-03 MED ORDER — FLUCONAZOLE 150 MG PO TABS: 150.0000 mg | ORAL_TABLET | Freq: Once | ORAL | 0 refills | Status: AC

## 2021-04-05 ENCOUNTER — Other Ambulatory Visit: Payer: Self-pay

## 2021-04-05 ENCOUNTER — Ambulatory Visit (INDEPENDENT_AMBULATORY_CARE_PROVIDER_SITE_OTHER): Payer: 59 | Admitting: Nurse Practitioner

## 2021-04-05 ENCOUNTER — Encounter: Payer: Self-pay | Admitting: Nurse Practitioner

## 2021-04-05 VITALS — BP 125/71 | HR 87 | Temp 99.0°F | Ht 65.0 in | Wt 279.6 lb

## 2021-04-05 DIAGNOSIS — N390 Urinary tract infection, site not specified: Secondary | ICD-10-CM

## 2021-04-05 DIAGNOSIS — E1165 Type 2 diabetes mellitus with hyperglycemia: Secondary | ICD-10-CM | POA: Diagnosis not present

## 2021-04-05 DIAGNOSIS — N898 Other specified noninflammatory disorders of vagina: Secondary | ICD-10-CM | POA: Diagnosis not present

## 2021-04-05 DIAGNOSIS — R82998 Other abnormal findings in urine: Secondary | ICD-10-CM | POA: Diagnosis not present

## 2021-04-05 DIAGNOSIS — Z794 Long term (current) use of insulin: Secondary | ICD-10-CM | POA: Diagnosis not present

## 2021-04-05 LAB — POCT URINALYSIS DIP (CLINITEK)
Bilirubin, UA: NEGATIVE
Blood, UA: NEGATIVE
Glucose, UA: 100 mg/dL — AB
Ketones, POC UA: NEGATIVE mg/dL
Nitrite, UA: NEGATIVE
POC PROTEIN,UA: NEGATIVE
Spec Grav, UA: >=1.03 — AB (ref 1.010–1.025)
Urobilinogen, UA: 0.2 E.U./dL
pH, UA: 5.5 (ref 5.0–8.0)

## 2021-04-05 LAB — POCT GLYCOSYLATED HEMOGLOBIN (HGB A1C)
HbA1c POC (<> result, manual entry): 9.2 % (ref 4.0–5.6)
HbA1c, POC (controlled diabetic range): 9.2 % — AB (ref 0.0–7.0)
HbA1c, POC (prediabetic range): 9.2 % — AB (ref 5.7–6.4)
Hemoglobin A1C: 9.2 % — AB (ref 4.0–5.6)

## 2021-04-05 NOTE — Progress Notes (Signed)
[place],   [address] Phone:  [phone]   Fax:  [phone]   Established Patient Office Visit  Subjective:  Patient ID: Lori Shea, female    DOB: 1976/03/04  Age: 45 y.o. MRN: [id number]  CC:  Chief Complaint  Patient presents with   Follow-up    Pt is here today for her 3 month follow up. Pt states that she was seen at an urgent care for UTI and she was given an antibiotic but it still has not cleared up. Pt has also developed a yeast infection.    HPI Lori Shea presents for follow up. She  has a past medical history of Allergy, Anemia, Arthritis, Asthma (age 34 yo), Cholelithiasis (age 56 yo), Closed avulsion fracture of lateral malleolus of right fibula (12/27/2016), COVID-19, Diabetes mellitus (2012), Elevated liver enzymes (09/13/2016), GERD (gastroesophageal reflux disease), Heart murmur, History of kidney stones, Hypertension (2015), Kidney stones, Migraines (2009), Morbid obesity ([place]), PCOS (polycystic ovarian syndrome) (2010), and Pneumonia.  She is in today for a 23-month follow-up.  She was recently seen in urgent care for UTI.  She reports that her treatment was changed based on urine culture. She is currently on bactrim. She continues to have odor and dysuria. She is itching, swelling and burning. She was prescribed Diflucan on Monday by gynecology.. She has had "relief with the itching and swelling but the yeast is still there". She reports that her CBG has been up and down and around range 150-320. She is prescribed  Novolog 10 -14 units and Lantus 40 units BID and metformin 500 mg BID .  She is taking her medications as directed.  She reports that her diet does continue to fluctuate.  She reports walking 1 mile per day.  She has had a 3 pound weight loss.  She is SP right chest cyst removal by dermatology. Past Medical History:  Diagnosis Date   Allergy    Foods:  mango, honey, corn.  Allergic to  "everything tested"  15 years ago in [place]   Anemia    with pregnancy   Arthritis    Shoulder and hips   Asthma age 57 yo   Triggers:  cold weather, exercise, allergens, anxiety, URIs   Cholelithiasis age 13 yo   Asymptomatic   Closed avulsion fracture of lateral malleolus of right fibula 12/27/2016   COVID-19    Diabetes mellitus 2012   Was diagnosed 2 years earlier with PCOS   Elevated liver enzymes 09/13/2016   GERD (gastroesophageal reflux disease)    Heart murmur    with pregnancy   History of kidney stones    Hypertension 2015   Kidney stones    family history of renal failure   Migraines 2009   Morbid obesity ([place])    PCOS (polycystic ovarian syndrome) 2010   Pneumonia     Past Surgical History:  Procedure Laterality Date   CHOLECYSTECTOMY N/A 12/01/2019   Procedure: LAPAROSCOPIC CHOLECYSTECTOMY;  Surgeon: Armandina Gemma, MD;  Location: [facility];  Service: General;  Laterality: N/A;   COLPOSCOPY N/A    LAPAROSCOPIC TUBAL LIGATION Bilateral 01/28/2019   Procedure: LAPAROSCOPIC TUBAL LIGATION AND PAP SMEAR;  Surgeon: Aletha Halim, MD;  Location: [facility];  Service: Gynecology;  Laterality: Bilateral;   TONSILLECTOMY AND ADENOIDECTOMY Bilateral 12/23/2018   Procedure: TONSILLECTOMY AND ADENOIDECTOMY;  Surgeon: Leta Baptist, MD;  Location: [place];  Service: ENT;  Laterality: Bilateral;  Family History  Problem Relation Age of Onset   Healthy Mother    Diabetes Father    Hyperlipidemia Father    Hypertension Father    Heart disease Father    Kidney disease Father        Developed after 2nd CABG   Colon polyps Father    Diabetes Paternal Grandfather    Heart disease Paternal Grandfather    Hyperlipidemia Paternal Grandfather    Hypertension Paternal Grandfather    Kidney disease Paternal Grandfather     Social History   Socioeconomic History   Marital status: Divorced    Spouse name: Not on file   Number of children: 1   Years  of education: some comm. college   Highest education level: Not on file  Occupational History   Occupation: CNA    Comment: [employer]  Tobacco Use   Smoking status: Never   Smokeless tobacco: Never  Vaping Use   Vaping Use: Never used  Substance and Sexual Activity   Alcohol use: No   Drug use: No   Sexual activity: Not Currently    Birth control/protection: Surgical    Comment: Sprintec.  Tubal ligation  Other Topics Concern   Not on file  Social History Narrative   Originally from [place], [place] to [place] in 1988.   In [place] in [place] at home with daughter on [place].   Social Determinants of Health   Financial Resource Strain: Not on file  Food Insecurity: Not on file  Transportation Needs: Not on file  Physical Activity: Not on file  Stress: Not on file  Social Connections: Not on file  Intimate Partner Violence: Not on file    Outpatient Medications Prior to Visit  Medication Sig Dispense Refill   albuterol (PROVENTIL) (2.5 MG/3ML) 0.083% nebulizer solution Take 3 mLs (2.5 mg total) by nebulization every 6 (six) hours as needed for wheezing or shortness of breath. 75 mL 6   albuterol (VENTOLIN HFA) 108 (90 Base) MCG/ACT inhaler INHALE 2 PUFFS INTO THE LUNGS EVERY 4 HOURS AS NEEDED FOR WHEEZING OR SHORTNESS OF BREATH. 8.5 g 0   amitriptyline (ELAVIL) 25 MG tablet TAKE 1 TABLET BY MOUTH NIGHTLY AT BEDTIME 30 tablet 11   cetirizine (ZYRTEC) 10 MG tablet Take 1 tablet (10 mg total) by mouth daily. 30 tablet 11   EASY COMFORT PEN NEEDLES 32G X 4 MM MISC use as directed test 2 times daily  3   esomeprazole (NEXIUM) 20 MG capsule Take 1 capsule (20 mg total) by mouth at bedtime. 90 capsule 3   fluticasone-salmeterol (ADVAIR DISKUS) 100-50 MCG/ACT AEPB Inhale 1 puff into the lungs 2 (two) times daily. 60 each 11   ibuprofen (ADVIL) 800 MG tablet Take 1 tablet (800 mg total) by mouth 3 (three) times daily. 21 tablet 0   insulin  aspart (NOVOLOG FLEXPEN) 100 UNIT/ML FlexPen Inject 10-14 Units into the skin 3 (three) times daily before meals. 30 mL 11   insulin glargine (LANTUS SOLOSTAR) 100 UNIT/ML Solostar Pen Inject 40 Units into the skin 2 (two) times daily. 24 mL 11   Insulin Pen Needle (PEN NEEDLES) 32G X 5 MM MISC 100 each by Does not apply route 2 (two) times daily. 100 each 3   MAGNESIUM CITRATE PO Take 1 capsule by mouth every morning.      medroxyPROGESTERone (PROVERA) 10 MG tablet 1 tab po qday starting on the first day of the month  and for the next 14 days. You should get a period after that. Repeat monthly. 42 tablet 3   meloxicam (MOBIC) 15 MG tablet Take 1 tablet (15 mg total) by mouth daily. 30 tablet 11   metFORMIN (GLUCOPHAGE) 500 MG tablet Take 1 tablet (500 mg total) by mouth 2 (two) times daily with a meal. 60 tablet 11   montelukast (SINGULAIR) 10 MG tablet Take 1 tablet (10 mg total) by mouth daily. 30 tablet 11   mupirocin ointment (BACTROBAN) 2 % Apply topically 2 (two) times daily.     olmesartan-hydrochlorothiazide (BENICAR HCT) 40-25 MG tablet Take 1 tablet by mouth daily. 90 tablet 3   promethazine (PHENERGAN) 25 MG tablet Take 25 mg by mouth every 8 (eight) hours as needed.     rizatriptan (MAXALT-MLT) 10 MG disintegrating tablet Take 1 tablet (10 mg total) by mouth as needed for migraine. May repeat in 2 hours if needed 12 tablet 0   simvastatin (ZOCOR) 5 MG tablet Take 1 tablet (5 mg total) by mouth at bedtime. 30 tablet 11   cholestyramine (QUESTRAN) 4 g packet Take 1 packet (4 g total) by mouth as needed (Diarrhea). Not to be taken within 4 hours of any other medication (Patient not taking: Reported on 04/05/2021) 60 each 0   cyclobenzaprine (FLEXERIL) 5 MG tablet Take 1 tablet (5 mg total) by mouth 3 (three) times daily as needed for muscle spasms. (Patient not taking: Reported on 04/05/2021) 10 tablet 0   metroNIDAZOLE (FLAGYL) 500 MG tablet Take 1 tablet (500 mg total) by mouth 2 (two)  times daily. (Patient not taking: Reported on 04/05/2021) 14 tablet 0   No facility-administered medications prior to visit.    Allergies  Allergen Reactions   Honey Shortness Of Breath   Mango Flavor Hives and Swelling    Pt allergic to all mango - tongue and throat swelling   Glipizide     Other reaction(s): Other   Liraglutide Nausea And Vomiting    ROS Review of Systems  Gastrointestinal:        Flank pain; right      Objective:    Physical Exam Constitutional:      Appearance: She is obese.  HENT:     Head: Normocephalic and atraumatic.     Nose: Nose normal.     Mouth/Throat:     Mouth: Mucous membranes are moist.  Cardiovascular:     Rate and Rhythm: Normal rate and regular rhythm.     Pulses: Normal pulses.     Heart sounds: Normal heart sounds.  Pulmonary:     Effort: Pulmonary effort is normal.     Breath sounds: Normal breath sounds.  Abdominal:     Comments: Increased abdominal girth   Musculoskeletal:        General: Normal range of motion.     Cervical back: Normal range of motion.  Skin:    General: Skin is warm and dry.     Capillary Refill: Capillary refill takes less than 2 seconds.  Neurological:     General: No focal deficit present.     Mental Status: She is alert and oriented to person, place, and time.  Psychiatric:        Mood and Affect: Mood normal.        Behavior: Behavior normal.    BP 125/71    Pulse 87    Temp 99 F (37.2 C)    Ht 5' 5" (1.651 m)  Wt 279 lb 9.6 oz (126.8 kg)    SpO2 96%    BMI 46.53 kg/m  Wt Readings from Last 3 Encounters:  04/05/21 279 lb 9.6 oz (126.8 kg)  01/11/21 281 lb 6 oz (127.6 kg)  01/04/21 283 lb (128.4 kg)     There are no preventive care reminders to display for this patient.   There are no preventive care reminders to display for this patient.  Lab Results  Component Value Date   TSH 2.780 02/11/2020   Lab Results  Component Value Date   WBC 9.0 01/04/2021   HGB 12.2  01/04/2021   HCT 36.0 01/04/2021   MCV 84.6 01/04/2021   PLT 260 01/04/2021   Lab Results  Component Value Date   NA 138 01/04/2021   K 3.7 01/04/2021   CO2 24 01/04/2021   GLUCOSE 283 (H) 01/04/2021   BUN 14 01/04/2021   CREATININE 0.59 01/04/2021   BILITOT 0.4 01/04/2021   ALKPHOS 93 01/04/2021   AST 21 01/04/2021   ALT 33 01/04/2021   PROT 6.5 01/04/2021   ALBUMIN 3.8 01/04/2021   CALCIUM 9.0 01/04/2021   ANIONGAP 9 01/04/2021   EGFR 104 12/28/2020   Lab Results  Component Value Date   CHOL 129 12/28/2020   Lab Results  Component Value Date   HDL 36 (L) 12/28/2020   Lab Results  Component Value Date   LDLCALC 60 12/28/2020   Lab Results  Component Value Date   TRIG 203 (H) 12/28/2020   Lab Results  Component Value Date   CHOLHDL 3.6 12/28/2020   Lab Results  Component Value Date   HGBA1C 9.2 (A) 04/05/2021   HGBA1C 9.2 04/05/2021   HGBA1C 9.2 (A) 04/05/2021   HGBA1C 9.2 (A) 04/05/2021      Assessment & Plan:   Problem List Items Addressed This Visit       Endocrine   Type 2 diabetes mellitus with hyperglycemia, with long-term current use of insulin (HCC) - Primary Persistent current A1c 9.2% up from 8.9% 3 months ago A1c greater than 9 indicating poor glycemic control Encourage compliance with current treatment regimen  Encourage regular CBG monitoring Encourage contacting office if excessive hyperglycemia and or hypoglycemia Lifestyle modification with healthy diet (fewer calories, more high fiber foods, whole grains and non-starchy vegetables, lower fat meat and fish, low-fat diary include healthy oils) regular exercise (physical activity) and weight loss Home BP monitoring also encouraged goal <130/80    Relevant Orders   HgB A1c (Completed)   Comp. Metabolic Panel (12)   Other Visit Diagnoses     Urinary tract infection without hematuria, site unspecified     Continue with current treatment as prescribed   Relevant Medications    mupirocin ointment (BACTROBAN) 2 %   Other Relevant Orders   POCT URINALYSIS DIP (CLINITEK)   Vaginal irritation Nuswab pending    No orders of the defined types were placed in this encounter.   Follow-up: Return in about 3 months (around 07/04/2021) for follow up DM [id number].    Vevelyn Francois, NP

## 2021-04-06 ENCOUNTER — Encounter: Payer: Self-pay | Admitting: Nurse Practitioner

## 2021-04-06 LAB — COMP. METABOLIC PANEL (12)
AST: 25 IU/L (ref 0–40)
Albumin/Globulin Ratio: 1.8 (ref 1.2–2.2)
Albumin: 4.3 g/dL (ref 3.8–4.8)
Alkaline Phosphatase: 103 IU/L (ref 44–121)
BUN/Creatinine Ratio: 13 (ref 9–23)
BUN: 12 mg/dL (ref 6–24)
Bilirubin Total: 0.2 mg/dL (ref 0.0–1.2)
Calcium: 9.1 mg/dL (ref 8.7–10.2)
Chloride: 103 mmol/L (ref 96–106)
Creatinine, Ser: 0.95 mg/dL (ref 0.57–1.00)
Globulin, Total: 2.4 g/dL (ref 1.5–4.5)
Glucose: 213 mg/dL — ABNORMAL HIGH (ref 70–99)
Potassium: 4.3 mmol/L (ref 3.5–5.2)
Sodium: 140 mmol/L (ref 134–144)
Total Protein: 6.7 g/dL (ref 6.0–8.5)
eGFR: 75 mL/min/{1.73_m2} (ref >59)

## 2021-04-09 LAB — NUSWAB VAGINITIS PLUS (VG+)
Atopobium vaginae: HIGH Score — AB
BVAB 2: HIGH Score — AB
Candida albicans, NAA: NEGATIVE
Candida glabrata, NAA: NEGATIVE
Chlamydia trachomatis, NAA: NEGATIVE
Megasphaera 1: HIGH Score — AB
Neisseria gonorrhoeae, NAA: NEGATIVE
Trich vag by NAA: NEGATIVE

## 2021-04-12 ENCOUNTER — Other Ambulatory Visit: Payer: Self-pay | Admitting: Nurse Practitioner

## 2021-04-12 LAB — URINE CULTURE

## 2021-04-12 MED ORDER — METRONIDAZOLE 500 MG PO TABS: 500.0000 mg | ORAL_TABLET | Freq: Three times a day (TID) | ORAL | 0 refills | Status: AC

## 2021-04-12 MED ORDER — NITROFURANTOIN MONOHYD MACRO 100 MG PO CAPS: 100.0000 mg | ORAL_CAPSULE | Freq: Two times a day (BID) | ORAL | 0 refills | Status: AC

## 2021-05-04 ENCOUNTER — Other Ambulatory Visit: Payer: Self-pay | Admitting: Nurse Practitioner

## 2021-05-09 ENCOUNTER — Other Ambulatory Visit: Payer: Self-pay

## 2021-05-09 ENCOUNTER — Ambulatory Visit (HOSPITAL_COMMUNITY)
Admission: EM | Admit: 2021-05-09 | Discharge: 2021-05-09 | Disposition: A | Discharge status: Home / Self Care | Payer: 59 | Attending: Family Medicine | Admitting: Family Medicine

## 2021-05-09 ENCOUNTER — Encounter (HOSPITAL_COMMUNITY): Payer: Self-pay

## 2021-05-09 DIAGNOSIS — E11628 Type 2 diabetes mellitus with other skin complications: Secondary | ICD-10-CM | POA: Diagnosis not present

## 2021-05-09 DIAGNOSIS — R519 Headache, unspecified: Secondary | ICD-10-CM | POA: Insufficient documentation

## 2021-05-09 DIAGNOSIS — Z8616 Personal history of COVID-19: Secondary | ICD-10-CM | POA: Diagnosis not present

## 2021-05-09 DIAGNOSIS — L03313 Cellulitis of chest wall: Secondary | ICD-10-CM | POA: Diagnosis not present

## 2021-05-09 DIAGNOSIS — E118 Type 2 diabetes mellitus with unspecified complications: Secondary | ICD-10-CM | POA: Insufficient documentation

## 2021-05-09 DIAGNOSIS — J069 Acute upper respiratory infection, unspecified: Secondary | ICD-10-CM | POA: Diagnosis not present

## 2021-05-09 DIAGNOSIS — Z20822 Contact with and (suspected) exposure to covid-19: Secondary | ICD-10-CM | POA: Insufficient documentation

## 2021-05-09 LAB — SARS CORONAVIRUS 2 (TAT 6-24 HRS): SARS Coronavirus 2: NEGATIVE

## 2021-05-09 MED ORDER — KETOROLAC TROMETHAMINE 30 MG/ML IJ SOLN
30.0000 mg | Freq: Once | INTRAMUSCULAR | Status: AC
  Administered 2021-05-09: 30 mg via INTRAMUSCULAR

## 2021-05-09 MED ORDER — KETOROLAC TROMETHAMINE 30 MG/ML IJ SOLN
INTRAMUSCULAR | Status: AC
  Filled 2021-05-09: qty 1

## 2021-05-09 MED ORDER — SULFAMETHOXAZOLE-TRIMETHOPRIM 800-160 MG PO TABS: 1.0000 | ORAL_TABLET | Freq: Two times a day (BID) | ORAL | 0 refills | Status: AC

## 2021-05-09 NOTE — ED Triage Notes (Signed)
Pt presents with HA, facial pain, and nasal congestion x4 days. Pt states she also had an infected cyst on her chest

## 2021-05-09 NOTE — Discharge Instructions (Addendum)
You have been given an injection of Toradol 30 mg today for the pain   You have been swabbed for COVID, and the test will result in the next 24 hours. Our staff will call you if positive. If the test is positive, you should quarantine for 5 days.  Take the sulfa antibiotic 1 tab twice daily for 7 days for the skin infection.  A culture has been done of your wound.  Staff will call you for that if it looks like you need a different antibiotic

## 2021-05-09 NOTE — ED Provider Notes (Addendum)
[place]    CSN: [id number] Arrival date & time: 05/09/21  1417      History   Chief Complaint Chief Complaint  Patient presents with   Headache   Nasal Congestion    HPI Lori Shea is a 46 y.o. female.    Headache Here for headache that is throbbing for the last 3 to 4 days.  She also has some nasal congestion and is uncertain exactly when she said that started but she has been taking meds for that also about 3 to 4 days  She does have a diabetes and her sugar is fairly well controlled of late.  She had a cyst removed on her anterior chest about 2 months ago and now it has been infected.  Her dermatologist treated her with doxycycline which she finished last week it is actually starting to enlarge again and is draining some pus sometimes on her bandage.  No fever or vomiting  Past Medical History:  Diagnosis Date   Allergy    Foods:  mango, honey, corn.  Allergic to "everything tested"  15 years ago in [place]   Anemia    with pregnancy   Arthritis    Shoulder and hips   Asthma age 55 yo   Triggers:  cold weather, exercise, allergens, anxiety, URIs   Cholelithiasis age 19 yo   Asymptomatic   Closed avulsion fracture of lateral malleolus of right fibula 12/27/2016   COVID-19    Diabetes mellitus 2012   Was diagnosed 2 years earlier with PCOS   Elevated liver enzymes 09/13/2016   GERD (gastroesophageal reflux disease)    Heart murmur    with pregnancy   History of kidney stones    Hypertension 2015   Kidney stones    family history of renal failure   Migraines 2009   Morbid obesity ([place])    PCOS (polycystic ovarian syndrome) 2010   Pneumonia     Patient Active Problem List   Diagnosis Date Noted   Stress incontinence of urine 01/02/2021   Skin irritation 01/02/2021   Hepatic steatosis 12/01/2019   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) 09/13/2016   Elevated liver enzymes 09/13/2016   Allergy    Asthma    Hypertension 04/23/2013   Irregular periods 02/22/2011   Type 2 diabetes mellitus with hyperglycemia, with long-term current use of insulin ([place]) 04/23/2010   PCOS (polycystic ovarian syndrome) 04/23/2008   Migraines 04/24/2007    Past Surgical History:  Procedure Laterality Date   CHOLECYSTECTOMY N/A 12/01/2019   Procedure: LAPAROSCOPIC CHOLECYSTECTOMY;  Surgeon: Armandina Gemma, MD;  Location: [facility];  Service: General;  Laterality: N/A;   COLPOSCOPY N/A    LAPAROSCOPIC TUBAL LIGATION Bilateral 01/28/2019   Procedure: LAPAROSCOPIC TUBAL LIGATION AND PAP SMEAR;  Surgeon: Aletha Halim, MD;  Location: [facility];  Service: Gynecology;  Laterality: Bilateral;   TONSILLECTOMY AND ADENOIDECTOMY Bilateral 12/23/2018   Procedure: TONSILLECTOMY AND ADENOIDECTOMY;  Surgeon: Leta Baptist, MD;  Location: [facility];  Service: ENT;  Laterality: Bilateral;  OB History     Gravida  4   Para  1   Term  1   Preterm      AB  3   Living  1      SAB  3   IAB      Ectopic      Multiple      Live Births               Home Medications    Prior to Admission medications   Medication Sig Start Date End Date Taking? Authorizing Provider  sulfamethoxazole-trimethoprim (BACTRIM DS) 800-160 MG tablet Take 1 tablet by mouth 2 (two) times daily for 7 days. 05/09/21 05/16/21 Yes Barrett Henle, MD  albuterol (PROVENTIL) (2.5 MG/3ML) 0.083% nebulizer solution Take 3 mLs (2.5 mg total) by nebulization every 6 (six) hours as needed for  wheezing or shortness of breath. 09/12/20   Vevelyn Francois, NP  albuterol (VENTOLIN HFA) 108 (90 Base) MCG/ACT inhaler INHALE 2 PUFFS INTO THE LUNGS EVERY 4 HOURS AS NEEDED FOR WHEEZING OR SHORTNESS OF BREATH. 05/05/21   Vevelyn Francois, NP  amitriptyline (ELAVIL) 25 MG tablet TAKE 1 TABLET BY MOUTH NIGHTLY AT BEDTIME 12/28/20   Vevelyn Francois, NP  cetirizine (ZYRTEC) 10 MG tablet Take 1 tablet (10 mg total) by mouth daily. 09/18/19   Vevelyn Francois, NP  cholestyramine Lucrezia Starch) 4 g packet Take 1 packet (4 g total) by mouth as needed (Diarrhea). Not to be taken within 4 hours of any other medication Patient not taking: Reported on 04/05/2021 01/11/21   Noralyn Pick, NP  cyclobenzaprine (FLEXERIL) 5 MG tablet Take 1 tablet (5 mg total) by mouth 3 (three) times daily as needed for muscle spasms. Patient not taking: Reported on 04/05/2021 12/28/20   Vevelyn Francois, NP  EASY COMFORT PEN NEEDLES 32G X 4 MM MISC use as directed test 2 times daily 05/14/17   [provider]  esomeprazole (NEXIUM) 20 MG capsule Take 1 capsule (20 mg total) by mouth at bedtime. 06/24/20 06/24/21  Vevelyn Francois, NP  fluticasone-salmeterol (ADVAIR DISKUS) 100-50 MCG/ACT AEPB Inhale 1 puff into the lungs 2 (two) times daily. 12/28/20   Vevelyn Francois, NP  ibuprofen (ADVIL) 800 MG tablet Take 1 tablet (800 mg total) by mouth 3 (three) times daily. 11/02/19   Drenda Freeze, MD  insulin aspart (NOVOLOG FLEXPEN) 100 UNIT/ML FlexPen Inject 10-14 Units into the skin 3 (three) times daily before meals. 12/28/20   Vevelyn Francois, NP  insulin glargine (LANTUS SOLOSTAR) 100 UNIT/ML Solostar Pen Inject 40 Units into the skin 2 (two) times daily. 12/28/20 12/28/21  Vevelyn Francois, NP  Insulin Pen Needle (PEN NEEDLES) 32G X 5 MM MISC 100 each by Does not apply route 2 (two) times daily. 05/14/17   Dorena Dew, FNP  MAGNESIUM CITRATE PO Take 1 capsule by mouth every morning.     [provider]  medroxyPROGESTERone  (PROVERA) 10 MG tablet 1 tab po qday starting on the first day of the month and for the next 14 days. You should get a period after that. Repeat monthly. 01/02/21   Aletha Halim, MD  meloxicam (MOBIC) 15 MG tablet Take 1 tablet (15 mg total) by mouth daily. 12/28/20 12/28/21  Vevelyn Francois, NP  metFORMIN (GLUCOPHAGE) 500 MG tablet Take 1 tablet (500 mg total) by mouth 2 (two) times daily with a meal. 12/28/20 12/28/21  Vevelyn Francois, NP  montelukast (SINGULAIR) 10 MG tablet Take 1 tablet (10 mg total) by mouth daily. 12/28/20 12/28/21  Vevelyn Francois, NP  mupirocin ointment (BACTROBAN) 2 % Apply topically 2 (two) times daily. 03/20/21   [provider]  olmesartan-hydrochlorothiazide (BENICAR HCT) 40-25 MG tablet Take 1 tablet by mouth daily. 12/28/20 12/28/21  Vevelyn Francois, NP  promethazine (PHENERGAN) 25 MG tablet Take 25 mg by mouth every 8 (eight) hours as needed. 03/23/21   [provider]  rizatriptan (MAXALT-MLT) 10 MG disintegrating tablet Take 1 tablet (10 mg total) by mouth as needed for migraine. May repeat in 2 hours if needed 12/28/20   Vevelyn Francois, NP  simvastatin (ZOCOR) 5 MG tablet Take 1 tablet (5 mg total) by mouth at bedtime. 12/28/20 12/28/21  Vevelyn Francois, NP    Family History Family History  Problem Relation Age of Onset   Healthy Mother    Diabetes Father    Hyperlipidemia Father    Hypertension Father    Heart disease Father    Kidney disease Father        Developed after 2nd CABG   Colon polyps Father    Diabetes Paternal Grandfather    Heart disease Paternal Grandfather    Hyperlipidemia Paternal Grandfather    Hypertension Paternal Grandfather    Kidney disease Paternal Grandfather     Social History Social History   Tobacco Use   Smoking status: Never   Smokeless tobacco: Never  Vaping Use   Vaping Use: Never used  Substance Use Topics   Alcohol use: No   Drug use: No     Allergies   Honey, Mango flavor, Glipizide, and  Liraglutide   Review of Systems Review of Systems  Neurological:  Positive for headaches.    Physical Exam Triage Vital Signs ED Triage Vitals  Enc Vitals Group     BP 05/09/21 1520 116/75     Pulse Rate 05/09/21 1520 91     Resp 05/09/21 1520 14     Temp 05/09/21 1520 98.4 F (36.9 C)     Temp Source 05/09/21 1520 Oral     SpO2 05/09/21 1520 100 %     Weight --      Height --      Head Circumference --      Peak Flow --      Pain Score 05/09/21 1521 8     Pain Loc --      Pain Edu? --      Excl. in [place]? --    No data found.  Updated Vital Signs BP 116/75 (BP Location: Left Arm)    Pulse 91    Temp 98.4 F (36.9 C) (Oral)    Resp 14    SpO2 100%   Visual Acuity Right Eye Distance:   Left Eye Distance:   Bilateral Distance:    Right Eye Near:   Left Eye Near:    Bilateral Near:     Physical Exam Vitals reviewed.  Constitutional:      General: She is not in acute distress.    Appearance: She is not toxic-appearing.  HENT:     Right Ear: Tympanic membrane normal.     Left Ear: Tympanic membrane normal.     Nose: Congestion present.     Mouth/Throat:     Mouth: Mucous membranes are moist.     Pharynx: No oropharyngeal exudate or posterior oropharyngeal erythema.  Eyes:     Extraocular Movements: Extraocular movements  intact.     Conjunctiva/sclera: Conjunctivae normal.     Pupils: Pupils are equal, round, and reactive to light.  Cardiovascular:     Rate and Rhythm: Normal rate and regular rhythm.     Heart sounds: No murmur heard. Pulmonary:     Effort: Pulmonary effort is normal.     Breath sounds: No wheezing, rhonchi or rales.  Musculoskeletal:     Cervical back: Neck supple.  Lymphadenopathy:     Cervical: No cervical adenopathy.  Skin:    Coloration: Skin is not jaundiced or pale.  Neurological:     Mental Status: She is oriented to person, place, and time.  Psychiatric:        Behavior: Behavior normal.     UC Treatments / Results   Labs (all labs ordered are listed, but only abnormal results are displayed) Labs Reviewed  AEROBIC CULTURE W GRAM STAIN (SUPERFICIAL SPECIMEN)  SARS CORONAVIRUS 2 (TAT 6-24 HRS)    EKG   Radiology No results found.  Procedures Procedures (including critical care time)  Medications Ordered in UC Medications  ketorolac (TORADOL) 30 MG/ML injection 30 mg (has no administration in time range)    Initial Impression / Assessment and Plan / UC Course  I have reviewed the triage vital signs and the nursing notes.  Pertinent labs & imaging results that were available during my care of the patient were reviewed by me and considered in my medical decision making (see chart for details).     Will test for covid. If positive, with her DM she is at risk for severe disease and today would be (I think) day 4 of illness. Creatinine is normal. If Positive, I would send in paxlovid for her.  Will also rx bactrim for the skin infection. New c/s done. Final Clinical Impressions(s) / UC Diagnoses   Final diagnoses:  Viral upper respiratory tract infection  Acute intractable headache, unspecified headache type  Cellulitis of chest wall     Discharge Instructions      You have been given an injection of Toradol 30 mg today for the pain   You have been swabbed for COVID, and the test will result in the next 24 hours. Our staff will call you if positive. If the test is positive, you should quarantine for 5 days.  Take the sulfa antibiotic 1 tab twice daily for 7 days for the skin infection.  A culture has been done of your wound.  Staff will call you for that if it looks like you need a different antibiotic     ED Prescriptions     Medication Sig Dispense Auth. Provider   sulfamethoxazole-trimethoprim (BACTRIM DS) 800-160 MG tablet Take 1 tablet by mouth 2 (two) times daily for 7 days. 14 tablet Bridgit Eynon, Gwenlyn Perking, MD      PDMP not reviewed this encounter.   Barrett Henle,  MD 05/09/21 1551    Barrett Henle, MD 05/09/21 [phone]

## 2021-05-12 ENCOUNTER — Ambulatory Visit (AMBULATORY_SURGERY_CENTER): Payer: Self-pay

## 2021-05-12 ENCOUNTER — Other Ambulatory Visit: Payer: Self-pay

## 2021-05-12 VITALS — Ht 65.0 in | Wt 282.0 lb

## 2021-05-12 DIAGNOSIS — Z1211 Encounter for screening for malignant neoplasm of colon: Secondary | ICD-10-CM

## 2021-05-12 LAB — AEROBIC CULTURE W GRAM STAIN (SUPERFICIAL SPECIMEN)

## 2021-05-12 MED ORDER — PEG 3350-KCL-NA BICARB-NACL 420 G PO SOLR: 4000.0000 mL | Freq: Once | ORAL | 0 refills | Status: AC

## 2021-05-12 NOTE — Progress Notes (Signed)
Denies allergies to eggs or soy products. Denies complication of anesthesia or sedation. Denies use of weight loss medication. Denies use of O2.   Emmi instructions given for colonoscopy.  

## 2021-05-17 ENCOUNTER — Other Ambulatory Visit: Payer: Self-pay | Admitting: Nurse Practitioner

## 2021-05-17 DIAGNOSIS — Z794 Long term (current) use of insulin: Secondary | ICD-10-CM

## 2021-05-25 ENCOUNTER — Other Ambulatory Visit: Payer: Self-pay

## 2021-05-25 ENCOUNTER — Encounter: Payer: Self-pay | Admitting: Gastroenterology

## 2021-05-25 ENCOUNTER — Ambulatory Visit (AMBULATORY_SURGERY_CENTER): Payer: 59 | Admitting: Gastroenterology

## 2021-05-25 VITALS — BP 108/66 | HR 70 | Temp 96.6°F | Resp 16 | Ht 65.0 in | Wt 282.0 lb

## 2021-05-25 DIAGNOSIS — Z1211 Encounter for screening for malignant neoplasm of colon: Secondary | ICD-10-CM

## 2021-05-25 DIAGNOSIS — D124 Benign neoplasm of descending colon: Secondary | ICD-10-CM

## 2021-05-25 DIAGNOSIS — K250 Acute gastric ulcer with hemorrhage: Secondary | ICD-10-CM

## 2021-05-25 DIAGNOSIS — K219 Gastro-esophageal reflux disease without esophagitis: Secondary | ICD-10-CM

## 2021-05-25 DIAGNOSIS — R12 Heartburn: Secondary | ICD-10-CM | POA: Diagnosis not present

## 2021-05-25 DIAGNOSIS — K2970 Gastritis, unspecified, without bleeding: Secondary | ICD-10-CM

## 2021-05-25 DIAGNOSIS — D122 Benign neoplasm of ascending colon: Secondary | ICD-10-CM

## 2021-05-25 DIAGNOSIS — K229 Disease of esophagus, unspecified: Secondary | ICD-10-CM

## 2021-05-25 HISTORY — PX: OTHER SURGICAL HISTORY: SHX169

## 2021-05-25 MED ORDER — ESOMEPRAZOLE MAGNESIUM 40 MG PO CPDR: 40.0000 mg | DELAYED_RELEASE_CAPSULE | Freq: Every day | ORAL | 1 refills | Status: DC

## 2021-05-25 MED ORDER — SODIUM CHLORIDE 0.9 % IV SOLN: 500.0000 mL | Freq: Once | INTRAVENOUS | Status: DC

## 2021-05-25 MED ORDER — CHOLESTYRAMINE 4 G PO PACK: 4.0000 g | PACK | ORAL | 0 refills | Status: DC | PRN

## 2021-05-25 NOTE — Op Note (Signed)
[facility] Patient Name: Lori Shea Procedure Date: 05/25/2021 9:56 AM MRN: [id number] Endoscopist: Justice Britain , MD Age: 46 Referring MD:  Date of Birth: 03-31-1976 Gender: Female Account #: [id number] Procedure:                Colonoscopy Indications:              Screening for colorectal malignant neoplasm Medicines:                Monitored Anesthesia Care Procedure:                Pre-Anesthesia Assessment:                           - Prior to the procedure, a History and Physical                            was performed, and patient medications and                            allergies were reviewed. The patient's tolerance of                            previous anesthesia was also reviewed. The risks                            and benefits of the procedure and the sedation                            options and risks were discussed with the patient.                            All questions were answered, and informed consent                            was obtained. Prior Anticoagulants: The patient has                            taken no previous anticoagulant or antiplatelet                            agents except for NSAID medication. ASA Grade                            Assessment: III - A patient with severe systemic                            disease. After reviewing the risks and benefits,                            the patient was deemed in satisfactory condition to                            undergo the procedure.  After obtaining informed consent, the colonoscope                            was passed under direct vision. Throughout the                            procedure, the patient's blood pressure, pulse, and                            oxygen saturations were monitored continuously. The                            CF [id number] #[id number] was introduced through the anus                            and advanced to the 5 cm into  the ileum. The                            colonoscopy was performed without difficulty. The                            patient tolerated the procedure. The quality of the                            bowel preparation was good. The terminal ileum,                            ileocecal valve, appendiceal orifice, and rectum                            were photographed. Scope In: 10:26:47 AM Scope Out: 10:40:38 AM Scope Withdrawal Time: 0 hours 11 minutes 39 seconds  Total Procedure Duration: 0 hours 13 minutes 51 seconds  Findings:                 The digital rectal exam findings include                            hemorrhoids. Pertinent negatives include no                            palpable rectal lesions.                           The terminal ileum and ileocecal valve appeared                            normal.                           Two sessile polyps were found in the descending                            colon and ascending colon. The polyps were 5 to 12  mm in size. These polyps were removed with a cold                            snare. Resection and retrieval were complete.                           A few small-mouthed diverticula were found in the                            ascending colon.                           Normal mucosa was found in the entire colon                            otherwise.                           Non-bleeding non-thrombosed internal hemorrhoids                            were found during retroflexion, during perianal                            exam and during digital exam. The hemorrhoids were                            Grade II (internal hemorrhoids that prolapse but                            reduce spontaneously). Complications:            No immediate complications. Estimated Blood Loss:     Estimated blood loss was minimal. Impression:               - Hemorrhoids found on digital rectal exam.                           -  The examined portion of the ileum was normal.                           - Two 5 to 12 mm polyps in the descending colon and                            in the ascending colon, removed with a cold snare.                            Resected and retrieved.                           - Diverticulosis in the ascending colon.                           - Normal mucosa in the entire examined colon  otherwise.                           - Non-bleeding non-thrombosed internal hemorrhoids. Recommendation:           - The patient will be observed post-procedure,                            until all discharge criteria are met.                           - Discharge patient to home.                           - Patient has a contact number available for                            emergencies. The signs and symptoms of potential                            delayed complications were discussed with the                            patient. Return to normal activities tomorrow.                            Written discharge instructions were provided to the                            patient.                           - High fiber diet.                           - Use Benefiber 1-2 portions PO daily to try and                            bulk the stool.                           - Continue present medications.                           - Await pathology results.                           - Repeat colonoscopy in 3 years for surveillance.                           - Start Cholestyramine 1-2 packets daily and may                            increase up to 4 packets daily pending how diarrhea                            does for presumed  bile-salt diarrhea. If issues                            persist in future additional workup and management                            may be required and potentially considering                            flexible sigmoidoscopy for microscopic colitis                             workup.                           - The findings and recommendations were discussed                            with the patient.                           - The findings and recommendations were discussed                            with the patient's family. Justice Britain, MD 05/25/2021 11:03:33 AM

## 2021-05-25 NOTE — Patient Instructions (Signed)
2 polyps removed- await pathology Gastric biopsies taken as well  Increase Nexium to 40 mg daily  Please read over handouts about polyps, hemorrhoids, diverticulosis and high fiber diets  Take Benefiber 1-2 portions daily to try and bulk up stools  Continue other medications  YOU HAD AN ENDOSCOPIC PROCEDURE TODAY AT [facility]:   Refer to the procedure report that was given to you for any specific questions about what was found during the examination.  If the procedure report does not answer your questions, please call your gastroenterologist to clarify.  If you requested that your care partner not be given the details of your procedure findings, then the procedure report has been included in a sealed envelope for you to review at your convenience later.  YOU SHOULD EXPECT: Some feelings of bloating in the abdomen. Passage of more gas than usual.  Walking can help get rid of the air that was put into your GI tract during the procedure and reduce the bloating. If you had a lower endoscopy (such as a colonoscopy or flexible sigmoidoscopy) you may notice spotting of blood in your stool or on the toilet paper. If you underwent a bowel prep for your procedure, you may not have a normal bowel movement for a few days.  Please Note:  You might notice some irritation and congestion in your nose or some drainage.  This is from the oxygen used during your procedure.  There is no need for concern and it should clear up in a day or so.  SYMPTOMS TO REPORT IMMEDIATELY:  Following lower endoscopy (colonoscopy or flexible sigmoidoscopy):  Excessive amounts of blood in the stool  Significant tenderness or worsening of abdominal pains  Swelling of the abdomen that is new, acute  Fever of 100F or higher  Following upper endoscopy (EGD)  Vomiting of blood or coffee ground material  New chest pain or pain under the shoulder blades  Painful or persistently difficult swallowing  New  shortness of breath  Fever of 100F or higher  Black, tarry-looking stools  For urgent or emergent issues, a gastroenterologist can be reached at any hour by calling [phone]. Do not use MyChart messaging for urgent concerns.    DIET:  We do recommend a small meal at first, but then you may proceed to your regular diet.  Drink plenty of fluids but you should avoid alcoholic beverages for 24 hours.  ACTIVITY:  You should plan to take it easy for the rest of today and you should NOT DRIVE or use heavy machinery until tomorrow (because of the sedation medicines used during the test).    FOLLOW UP: Our staff will call the number listed on your records 48-72 hours following your procedure to check on you and address any questions or concerns that you may have regarding the information given to you following your procedure. If we do not reach you, we will leave a message.  We will attempt to reach you two times.  During this call, we will ask if you have developed any symptoms of COVID 19. If you develop any symptoms (ie: fever, flu-like symptoms, shortness of breath, cough etc.) before then, please call [phone].  If you test positive for Covid 19 in the 2 weeks post procedure, please call and report this information to [place].    If any biopsies were taken you will be contacted by phone or by letter within the next 1-3 weeks.  Please call us at [phone]  if you have not heard about the biopsies in 3 weeks.    SIGNATURES/CONFIDENTIALITY: You and/or your care partner have signed paperwork which will be entered into your electronic medical record.  These signatures attest to the fact that that the information above on your After Visit Summary has been reviewed and is understood.  Full responsibility of the confidentiality of this discharge information lies with you and/or your care-partner.

## 2021-05-25 NOTE — Progress Notes (Signed)
Pt in recovery with monitors in place, VSS. Report given to receiving RN. Bite guard was placed with pt awake to ensure comfort. No dental or soft tissue damage noted. 

## 2021-05-25 NOTE — Progress Notes (Signed)
GASTROENTEROLOGY PROCEDURE H&P NOTE   Primary Care Physician: Vevelyn Francois, NP  HPI: Lori Shea is a 46 y.o. female who presents for EGD/Colonoscopy for evaluation of GERD and Barrett's Esophagus screening as well as Screening colonoscopy with FHx of Colon polyps (father) and prior issues of possible bile salt diarrhea - she has not had previous colonoscopy for screening.  Past Medical History:  Diagnosis Date   Allergy    Foods:  mango, honey, corn.  Allergic to "everything tested"  15 years ago in [place]   Anemia    with pregnancy   Anxiety    Arthritis    Shoulder and hips   Asthma age 70 yo   Triggers:  cold weather, exercise, allergens, anxiety, URIs   Cholelithiasis age 39 yo   Asymptomatic   Closed avulsion fracture of lateral malleolus of right fibula 12/27/2016   COVID-19    Diabetes mellitus 2012   Was diagnosed 2 years earlier with PCOS   Elevated liver enzymes 09/13/2016   GERD (gastroesophageal reflux disease)    Heart murmur    with pregnancy   History of kidney stones    Hypertension 2015   Kidney stones    family history of renal failure   Migraines 2009   Morbid obesity ([place])    PCOS (polycystic ovarian syndrome) 2010   Pneumonia    Past Surgical History:  Procedure Laterality Date   CHOLECYSTECTOMY N/A 12/01/2019   Procedure: LAPAROSCOPIC CHOLECYSTECTOMY;  Surgeon: Armandina Gemma, MD;  Location: [facility];  Service: General;  Laterality: N/A;   COLPOSCOPY N/A    CYST EXCISION     LAPAROSCOPIC TUBAL LIGATION Bilateral 01/28/2019   Procedure: LAPAROSCOPIC TUBAL LIGATION AND PAP SMEAR;  Surgeon: Aletha Halim, MD;  Location: [facility];  Service: Gynecology;  Laterality: Bilateral;   TONSILLECTOMY AND ADENOIDECTOMY Bilateral 12/23/2018   Procedure: TONSILLECTOMY AND ADENOIDECTOMY;  Surgeon: Leta Baptist, MD;  Location: [facility];  Service: ENT;  Laterality: Bilateral;   Current Outpatient Medications  Medication  Sig Dispense Refill   albuterol (PROVENTIL) (2.5 MG/3ML) 0.083% nebulizer solution Take 3 mLs (2.5 mg total) by nebulization every 6 (six) hours as needed for wheezing or shortness of breath. 75 mL 6   albuterol (VENTOLIN HFA) 108 (90 Base) MCG/ACT inhaler INHALE 2 PUFFS INTO THE LUNGS EVERY 4 HOURS AS NEEDED FOR WHEEZING OR SHORTNESS OF BREATH. 18 g 2   amitriptyline (ELAVIL) 25 MG tablet TAKE 1 TABLET BY MOUTH NIGHTLY AT BEDTIME 30 tablet 11   cetirizine (ZYRTEC) 10 MG tablet Take 1 tablet (10 mg total) by mouth daily. 30 tablet 11   cholestyramine (QUESTRAN) 4 g packet Take 1 packet (4 g total) by mouth as needed (Diarrhea). Not to be taken within 4 hours of any other medication 60 each 0   cyclobenzaprine (FLEXERIL) 5 MG tablet Take 1 tablet (5 mg total) by mouth 3 (three) times daily as needed for muscle spasms. 10 tablet 0   EASY COMFORT PEN NEEDLES 32G X 4 MM MISC use as directed test 2 times daily  3   esomeprazole (NEXIUM) 20 MG capsule Take 1 capsule (20 mg total) by mouth at bedtime. 90 capsule 3   fluticasone-salmeterol (ADVAIR DISKUS) 100-50 MCG/ACT AEPB Inhale 1 puff into the lungs 2 (two) times daily. 60 each 11   ibuprofen (ADVIL) 800 MG tablet Take 1 tablet (800 mg total) by mouth 3 (three) times daily. 21 tablet 0   insulin aspart (NOVOLOG  FLEXPEN) 100 UNIT/ML FlexPen Inject 10-14 Units into the skin 3 (three) times daily before meals. 30 mL 11   insulin glargine (LANTUS SOLOSTAR) 100 UNIT/ML Solostar Pen Inject 40 Units into the skin 2 (two) times daily. 24 mL 11   Insulin Pen Needle (PEN NEEDLES) 32G X 5 MM MISC 100 each by Does not apply route 2 (two) times daily. 100 each 3   MAGNESIUM CITRATE PO Take 1 capsule by mouth every morning.      medroxyPROGESTERone (PROVERA) 10 MG tablet 1 tab po qday starting on the first day of the month and for the next 14 days. You should get a period after that. Repeat monthly. 42 tablet 3   meloxicam (MOBIC) 15 MG tablet Take 1 tablet (15 mg  total) by mouth daily. 30 tablet 11   metFORMIN (GLUCOPHAGE) 500 MG tablet Take 1 tablet (500 mg total) by mouth 2 (two) times daily with a meal. 60 tablet 11   montelukast (SINGULAIR) 10 MG tablet Take 1 tablet (10 mg total) by mouth daily. 30 tablet 11   mupirocin ointment (BACTROBAN) 2 % Apply topically 2 (two) times daily.     olmesartan-hydrochlorothiazide (BENICAR HCT) 40-25 MG tablet Take 1 tablet by mouth daily. 90 tablet 3   promethazine (PHENERGAN) 25 MG tablet Take 25 mg by mouth every 8 (eight) hours as needed.     rizatriptan (MAXALT-MLT) 10 MG disintegrating tablet Take 1 tablet (10 mg total) by mouth as needed for migraine. May repeat in 2 hours if needed 12 tablet 0   simvastatin (ZOCOR) 5 MG tablet Take 1 tablet (5 mg total) by mouth at bedtime. 30 tablet 11   Current Facility-Administered Medications  Medication Dose Route Frequency Provider Last Rate Last Admin   0.9 %  sodium chloride infusion  500 mL Intravenous Once Mansouraty, Telford Nab., MD        Current Outpatient Medications:    albuterol (PROVENTIL) (2.5 MG/3ML) 0.083% nebulizer solution, Take 3 mLs (2.5 mg total) by nebulization every 6 (six) hours as needed for wheezing or shortness of breath., Disp: 75 mL, Rfl: 6   albuterol (VENTOLIN HFA) 108 (90 Base) MCG/ACT inhaler, INHALE 2 PUFFS INTO THE LUNGS EVERY 4 HOURS AS NEEDED FOR WHEEZING OR SHORTNESS OF BREATH., Disp: 18 g, Rfl: 2   amitriptyline (ELAVIL) 25 MG tablet, TAKE 1 TABLET BY MOUTH NIGHTLY AT BEDTIME, Disp: 30 tablet, Rfl: 11   cetirizine (ZYRTEC) 10 MG tablet, Take 1 tablet (10 mg total) by mouth daily., Disp: 30 tablet, Rfl: 11   cholestyramine (QUESTRAN) 4 g packet, Take 1 packet (4 g total) by mouth as needed (Diarrhea). Not to be taken within 4 hours of any other medication, Disp: 60 each, Rfl: 0   cyclobenzaprine (FLEXERIL) 5 MG tablet, Take 1 tablet (5 mg total) by mouth 3 (three) times daily as needed for muscle spasms., Disp: 10 tablet, Rfl: 0    EASY COMFORT PEN NEEDLES 32G X 4 MM MISC, use as directed test 2 times daily, Disp: , Rfl: 3   esomeprazole (NEXIUM) 20 MG capsule, Take 1 capsule (20 mg total) by mouth at bedtime., Disp: 90 capsule, Rfl: 3   fluticasone-salmeterol (ADVAIR DISKUS) 100-50 MCG/ACT AEPB, Inhale 1 puff into the lungs 2 (two) times daily., Disp: 60 each, Rfl: 11   ibuprofen (ADVIL) 800 MG tablet, Take 1 tablet (800 mg total) by mouth 3 (three) times daily., Disp: 21 tablet, Rfl: 0   insulin aspart (NOVOLOG FLEXPEN) 100 UNIT/ML FlexPen,  Inject 10-14 Units into the skin 3 (three) times daily before meals., Disp: 30 mL, Rfl: 11   insulin glargine (LANTUS SOLOSTAR) 100 UNIT/ML Solostar Pen, Inject 40 Units into the skin 2 (two) times daily., Disp: 24 mL, Rfl: 11   Insulin Pen Needle (PEN NEEDLES) 32G X 5 MM MISC, 100 each by Does not apply route 2 (two) times daily., Disp: 100 each, Rfl: 3   MAGNESIUM CITRATE PO, Take 1 capsule by mouth every morning. , Disp: , Rfl:    medroxyPROGESTERone (PROVERA) 10 MG tablet, 1 tab po qday starting on the first day of the month and for the next 14 days. You should get a period after that. Repeat monthly., Disp: 42 tablet, Rfl: 3   meloxicam (MOBIC) 15 MG tablet, Take 1 tablet (15 mg total) by mouth daily., Disp: 30 tablet, Rfl: 11   metFORMIN (GLUCOPHAGE) 500 MG tablet, Take 1 tablet (500 mg total) by mouth 2 (two) times daily with a meal., Disp: 60 tablet, Rfl: 11   montelukast (SINGULAIR) 10 MG tablet, Take 1 tablet (10 mg total) by mouth daily., Disp: 30 tablet, Rfl: 11   mupirocin ointment (BACTROBAN) 2 %, Apply topically 2 (two) times daily., Disp: , Rfl:    olmesartan-hydrochlorothiazide (BENICAR HCT) 40-25 MG tablet, Take 1 tablet by mouth daily., Disp: 90 tablet, Rfl: 3   promethazine (PHENERGAN) 25 MG tablet, Take 25 mg by mouth every 8 (eight) hours as needed., Disp: , Rfl:    rizatriptan (MAXALT-MLT) 10 MG disintegrating tablet, Take 1 tablet (10 mg total) by mouth as needed for  migraine. May repeat in 2 hours if needed, Disp: 12 tablet, Rfl: 0   simvastatin (ZOCOR) 5 MG tablet, Take 1 tablet (5 mg total) by mouth at bedtime., Disp: 30 tablet, Rfl: 11  Current Facility-Administered Medications:    0.9 %  sodium chloride infusion, 500 mL, Intravenous, Once, Mansouraty, Telford Nab., MD Allergies  Allergen Reactions   Honey Shortness Of Breath   Mango Flavor Hives and Swelling    Pt allergic to all mango - tongue and throat swelling   Glipizide     Other reaction(s): Other   Liraglutide Nausea And Vomiting   Family History  Problem Relation Age of Onset   Healthy Mother    Diabetes Father    Hyperlipidemia Father    Hypertension Father    Heart disease Father    Kidney disease Father        Developed after 2nd CABG   Colon polyps Father    Diabetes Paternal Grandfather    Heart disease Paternal Grandfather    Hyperlipidemia Paternal Grandfather    Hypertension Paternal Grandfather    Kidney disease Paternal Grandfather    Social History   Socioeconomic History   Marital status: Divorced    Spouse name: Not on file   Number of children: 1   Years of education: some comm. college   Highest education level: Not on file  Occupational History   Occupation: CNA    Comment: [employer]  Tobacco Use   Smoking status: Never   Smokeless tobacco: Never  Vaping Use   Vaping Use: Never used  Substance and Sexual Activity   Alcohol use: No   Drug use: No   Sexual activity: Not Currently    Birth control/protection: Surgical    Comment: Sprintec.  Tubal ligation  Other Topics Concern   Not on file  Social History Narrative   Originally from [place], [place]  Moved to [place] in 1988.   In [place] in [place] at home with daughter on [address].   Social Determinants of Health   Financial Resource Strain: Not on file  Food Insecurity: Not on file  Transportation Needs: Not on file  Physical Activity: Not on file   Stress: Not on file  Social Connections: Not on file  Intimate Partner Violence: Not on file    Physical Exam: Today's Vitals   05/25/21 0941  BP: (!) 152/75  Pulse: 97  Temp: (!) 96.6 F (35.9 C)  TempSrc: Temporal  SpO2: 99%  Weight: 282 lb (127.9 kg)  Height: 5\' 5"  (1.651 m)   Body mass index is 46.93 kg/m. GEN: NAD EYE: Sclerae anicteric ENT: MMM CV: Non-tachycardic GI: Soft, NT/ND NEURO:  Alert & Oriented x 3  Lab Results: No results for input(s): WBC, HGB, HCT, PLT in the last 72 hours. BMET No results for input(s): NA, K, CL, CO2, GLUCOSE, BUN, CREATININE, CALCIUM in the last 72 hours. LFT No results for input(s): PROT, ALBUMIN, AST, ALT, ALKPHOS, BILITOT, BILIDIR, IBILI in the last 72 hours. PT/INR No results for input(s): LABPROT, INR in the last 72 hours.   Impression / Plan: This is a 46 y.o.female who presents for EGD/Colonoscopy for evaluation of GERD and Barrett's Esophagus screening as well as Screening colonoscopy with FHx of Colon polyps (father) and prior issues of possible bile salt diarrhea - she has not had previous colonoscopy for screening.  The risks and benefits of endoscopic evaluation/treatment were discussed with the patient and/or family; these include but are not limited to the risk of perforation, infection, bleeding, missed lesions, lack of diagnosis, severe illness requiring hospitalization, as well as anesthesia and sedation related illnesses.  The patient's history has been reviewed, patient examined, no change in status, and deemed stable for procedure.  The patient and/or family is agreeable to proceed.    Justice Britain, MD [facility] Office # [phone]

## 2021-05-25 NOTE — Progress Notes (Signed)
Pt's states no medical or surgical changes since previsit or office visit.   VS [place]

## 2021-05-25 NOTE — Progress Notes (Signed)
Per Dr. Rush Landmark- re order Cholestryramine per previous order. Increase Nexium to 40 mg daily- send RX to pharmacy #[id number], 1 refill

## 2021-05-25 NOTE — Op Note (Signed)
[facility] Patient Name: Lori Shea Procedure Date: 05/25/2021 9:52 AM MRN: [id number] Endoscopist: Justice Britain , MD Age: 46 Referring MD:  Date of Birth: [birth year] Gender: Female Account #: [id number] Procedure:                Upper GI endoscopy Indications:              Heartburn, Esophageal reflux symptoms that recur                            despite appropriate therapy, Screening for                            Barrett's esophagus in patient at risk for this                            condition Medicines:                Monitored Anesthesia Care Procedure:                Pre-Anesthesia Assessment:                           - Prior to the procedure, a History and Physical                            was performed, and patient medications and                            allergies were reviewed. The patient's tolerance of                            previous anesthesia was also reviewed. The risks                            and benefits of the procedure and the sedation                            options and risks were discussed with the patient.                            All questions were answered, and informed consent                            was obtained. Prior Anticoagulants: The patient has                            taken no previous anticoagulant or antiplatelet                            agents except for aspirin. ASA Grade Assessment:                            III - A patient with severe systemic disease. After  reviewing the risks and benefits, the patient was                            deemed in satisfactory condition to undergo the                            procedure.                           After obtaining informed consent, the endoscope was                            passed under direct vision. Throughout the                            procedure, the patient's blood pressure, pulse, and                             oxygen saturations were monitored continuously. The                            GIF [id number] #[id number] was introduced through the                            mouth, and advanced to the second part of duodenum. Scope In: Scope Out: Findings:                 No gross lesions were noted in the entire esophagus.                           The Z-line was irregular and was found 35 cm from                            the incisors.                           Localized moderately erythematous mucosa with                            bleeding on contact was found on the greater                            curvature of the gastric body.                           No other gross lesions were noted in the entire                            examined stomach. Biopsies were taken with a cold                            forceps for histology and Helicobacter pylori                            testing.  No gross lesions were noted in the duodenal bulb,                            in the first portion of the duodenum and in the                            second portion of the duodenum. Complications:            No immediate complications. Estimated Blood Loss:     Estimated blood loss was minimal. Impression:               - No gross lesions in esophagus. Z-line irregular,                            35 cm from the incisors.                           - Erythematous mucosa in the greater curvature of                            the gastric body. No other gross lesions in the                            stomach. Biopsied.                           - No gross lesions in the duodenal bulb, in the                            first portion of the duodenum and in the second                            portion of the duodenum. Recommendation:           - Proceed to scheduled colonoscopy.                           - Increase to Nexium 40 mg daily.                           - Continue present medications  otherwise.                           - Await pathology results.                           - The findings and recommendations were discussed                            with the patient.                           - The findings and recommendations were discussed                            with the patient's family.  Justice Britain, MD 05/25/2021 10:58:56 AM

## 2021-05-25 NOTE — Progress Notes (Signed)
Called to room to assist during endoscopic procedure.  Patient ID and intended procedure confirmed with present staff. Received instructions for my participation in the procedure from the performing physician.  

## 2021-05-29 ENCOUNTER — Telehealth: Payer: Self-pay

## 2021-05-29 ENCOUNTER — Telehealth: Payer: Self-pay | Admitting: *Deleted

## 2021-05-29 NOTE — Telephone Encounter (Signed)
°  Follow up Call-  Call back number 05/25/2021  Post procedure Call Back phone  # [phone]  Permission to leave phone message Yes  Some recent data might be hidden     Patient questions:  Do you have a fever, pain , or abdominal swelling? No. Pain Score  0 *  Have you tolerated food without any problems? Yes.    Have you been able to return to your normal activities? Yes.    Do you have any questions about your discharge instructions: Diet   Yes.   Medications  No. Follow up visit  No.  Do you have questions or concerns about your Care? No.  Actions: * If pain score is 4 or above: No action needed, pain <4.  Have you developed a fever since your procedure? no  2.   Have you had an respiratory symptoms (SOB or cough) since your procedure? no  3.   Have you tested positive for COVID 19 since your procedure no  4.   Have you had any family members/close contacts diagnosed with the COVID 19 since your procedure?  no   If yes to any of these questions please route to Joylene John, RN and Joella Prince, RN

## 2021-05-29 NOTE — Telephone Encounter (Signed)
°  Follow up Call-  Call back number 05/25/2021  Post procedure Call Back phone  # [phone]  Permission to leave phone message Yes  Some recent data might be hidden    First follow up attempt, LVM

## 2021-05-30 ENCOUNTER — Encounter: Payer: Self-pay | Admitting: Gastroenterology

## 2021-06-05 ENCOUNTER — Other Ambulatory Visit: Payer: Self-pay | Admitting: Nurse Practitioner

## 2021-06-05 MED ORDER — INSULIN GLARGINE-YFGN 100 UNIT/ML ~~LOC~~ SOPN: 40.0000 [IU] | PEN_INJECTOR | Freq: Two times a day (BID) | SUBCUTANEOUS | 0 refills | Status: DC

## 2021-06-05 NOTE — Progress Notes (Signed)
   [facility] [address] [place], [place]  [address] Phone:  [phone]   Fax:  [phone] 

## 2021-06-08 ENCOUNTER — Other Ambulatory Visit: Payer: Self-pay | Admitting: Nurse Practitioner

## 2021-06-08 DIAGNOSIS — G4700 Insomnia, unspecified: Secondary | ICD-10-CM

## 2021-06-22 ENCOUNTER — Encounter: Payer: Self-pay | Admitting: Gastroenterology

## 2021-06-22 NOTE — Telephone Encounter (Signed)
Dr Rush Landmark the pt cannot afford cholestyramine and wants to know what else she can try, please advise? ?

## 2021-06-23 ENCOUNTER — Other Ambulatory Visit: Payer: Self-pay

## 2021-06-23 DIAGNOSIS — K219 Gastro-esophageal reflux disease without esophagitis: Secondary | ICD-10-CM

## 2021-06-23 DIAGNOSIS — K2970 Gastritis, unspecified, without bleeding: Secondary | ICD-10-CM

## 2021-06-23 DIAGNOSIS — K229 Disease of esophagus, unspecified: Secondary | ICD-10-CM

## 2021-06-23 MED ORDER — CHOLESTYRAMINE 4 GM/DOSE PO POWD: 4.0000 g | Freq: Every day | ORAL | 12 refills | Status: DC | PRN

## 2021-06-23 NOTE — Telephone Encounter (Signed)
Lori Shea, ?Please find out with the patient if she actually ended up using it at all.  If she did not she found that effective then sometimes some of the pharmacies may have cans or jars of the cholestyramine where packets may not be needed.  That usually becomes cheaper.  The other thing is that sometimes just using good Rx the prices at different pharmacies is significantly less when we get the carton or jar of the powder and she mixes it by herself. ?I would try to advocate [place] trying to find a pharmacy that may have the jar or carton as it will be cheaper than having the packets. ?If that is still not a possibility let me know and we can decide something from there. ?Thanks. ?GM ?

## 2021-07-05 ENCOUNTER — Other Ambulatory Visit: Payer: Self-pay

## 2021-07-05 ENCOUNTER — Ambulatory Visit (INDEPENDENT_AMBULATORY_CARE_PROVIDER_SITE_OTHER): Payer: 59 | Admitting: Nurse Practitioner

## 2021-07-05 ENCOUNTER — Encounter: Payer: Self-pay | Admitting: Nurse Practitioner

## 2021-07-05 VITALS — BP 121/98 | HR 100 | Temp 97.6°F | Ht 65.0 in | Wt 273.4 lb

## 2021-07-05 DIAGNOSIS — Z794 Long term (current) use of insulin: Secondary | ICD-10-CM | POA: Diagnosis not present

## 2021-07-05 DIAGNOSIS — G43001 Migraine without aura, not intractable, with status migrainosus: Secondary | ICD-10-CM | POA: Diagnosis not present

## 2021-07-05 DIAGNOSIS — E1165 Type 2 diabetes mellitus with hyperglycemia: Secondary | ICD-10-CM

## 2021-07-05 LAB — POCT GLYCOSYLATED HEMOGLOBIN (HGB A1C)
HbA1c POC (<> result, manual entry): 9.9 % (ref 4.0–5.6)
HbA1c, POC (controlled diabetic range): 9.9 % — AB (ref 0.0–7.0)
HbA1c, POC (prediabetic range): 9.9 % — AB (ref 5.7–6.4)
Hemoglobin A1C: 9.9 % — AB (ref 4.0–5.6)

## 2021-07-05 MED ORDER — DAPAGLIFLOZIN PROPANEDIOL 5 MG PO TABS: 5.0000 mg | ORAL_TABLET | Freq: Every day | ORAL | 2 refills | Status: DC

## 2021-07-05 MED ORDER — RIZATRIPTAN BENZOATE 10 MG PO TBDP: 10.0000 mg | ORAL_TABLET | ORAL | 0 refills | Status: DC | PRN

## 2021-07-05 NOTE — Patient Instructions (Signed)

## 2021-07-05 NOTE — Progress Notes (Signed)
? ?[facility] ?[place], [place]  [address] ?Phone:  [phone]   Fax:  [phone] ? ? ?Established Patient Office Visit ? ?Subjective:  ?Patient ID: Lori Shea, female    DOB: November 10, 1975  Age: 46 y.o. MRN: [id number] ? ?CC:  ?Chief Complaint  ?Patient presents with  ? Follow-up  ?  Pt is here for 3 month follow up. Pt stated she is concern about her sugar it has been running high since she started. Pt requesting a refill on Maxalt  ? ? ?HPI ?Lori Shea presents for follow up. She  has a past medical history of Allergy, Anemia, Anxiety, Arthritis, Asthma (age 85 yo), Cholelithiasis (age 39 yo), Closed avulsion fracture of lateral malleolus of right fibula (12/27/2016), COVID-19, Diabetes mellitus (2012), Elevated liver enzymes (09/13/2016), GERD (gastroesophageal reflux disease), Heart murmur, History of kidney stones, Hypertension (2015), Kidney stones, Migraines (2009), Morbid obesity ([place]), PCOS (polycystic ovarian syndrome) (2010), and Pneumonia.  ? ?Lori Shea is in today for diabetes follow up. The prescribed treatment is metformin 500 mg BID, semglee 40 BID and novolog  10-14 units along with statin therapy simvastatin. The reported use of treatment os  consistent with prescribed. There are no reported side effects from the treatment. Home glucose monitoring indicates a CBG range of 197 to 348. ?She denies any change. She has been walking 2.5 miles.  ?Denies fever, chills, headache, dizziness, visual changes, polydipsia,  polyphagia shortness of breath, dyspnea on exertion, chest pain, abdominal pain, nausea, vomiting, polyuria, constipation, diarrhea,  any edema, numbness, tingling, burning of hand or feet. ? ?Past Medical History:  ?Diagnosis Date  ? Allergy   ? Foods:  mango, honey, corn.  Allergic to "everything tested"  15 years ago in [place]  ? Anemia   ? with pregnancy  ? Anxiety   ? Arthritis   ? Shoulder and hips  ? Asthma age 39 yo  ? Triggers:  cold  weather, exercise, allergens, anxiety, URIs  ? Cholelithiasis age 17 yo  ? Asymptomatic  ? Closed avulsion fracture of lateral malleolus of right fibula 12/27/2016  ? COVID-19   ? Diabetes mellitus 2012  ? Was diagnosed 2 years earlier with PCOS  ? Elevated liver enzymes 09/13/2016  ? GERD (gastroesophageal reflux disease)   ? Heart murmur   ? with pregnancy  ? History of kidney stones   ? Hypertension 2015  ? Kidney stones   ? family history of renal failure  ? Migraines 2009  ? Morbid obesity ([place])   ? PCOS (polycystic ovarian syndrome) 2010  ? Pneumonia   ? ? ?Past Surgical History:  ?Procedure Laterality Date  ? CHOLECYSTECTOMY N/A 12/01/2019  ? Procedure: LAPAROSCOPIC CHOLECYSTECTOMY;  Surgeon: Armandina Gemma, MD;  Location: [facility];  Service: General;  Laterality: N/A;  ? COLPOSCOPY N/A   ? CYST EXCISION    ? LAPAROSCOPIC TUBAL LIGATION Bilateral 01/28/2019  ? Procedure: LAPAROSCOPIC TUBAL LIGATION AND PAP SMEAR;  Surgeon: Aletha Halim, MD;  Location: [facility];  Service: Gynecology;  Laterality: Bilateral;  ? TONSILLECTOMY AND ADENOIDECTOMY Bilateral 12/23/2018  ? Procedure: TONSILLECTOMY AND ADENOIDECTOMY;  Surgeon: Leta Baptist, MD;  Location: [facility];  Service: ENT;  Laterality: Bilateral;  ? ? ?Family History  ?Problem Relation Age of Onset  ? Healthy Mother   ? Diabetes Father   ? Hyperlipidemia Father   ? Hypertension Father   ? Heart disease Father   ? Kidney disease Father   ?  Developed after 2nd CABG  ? Colon polyps Father   ? Diabetes Paternal Grandfather   ? Heart disease Paternal Grandfather   ? Hyperlipidemia Paternal Grandfather   ? Hypertension Paternal Grandfather   ? Kidney disease Paternal Grandfather   ? ? ?Social History  ? ?Socioeconomic History  ? Marital status: Divorced  ?  Spouse name: Not on file  ? Number of children: 1  ? Years of education: some comm. college  ? Highest education level: Not on file  ?Occupational History  ? Occupation: CNA  ?   Comment: [employer]  ?Tobacco Use  ? Smoking status: Never  ? Smokeless tobacco: Never  ?Vaping Use  ? Vaping Use: Never used  ?Substance and Sexual Activity  ? Alcohol use: No  ? Drug use: No  ? Sexual activity: Not Currently  ?  Birth control/protection: Surgical  ?  Comment: Sprintec.  Tubal ligation  ?Other Topics Concern  ? Not on file  ?Social History Narrative  ? Originally from [place], [place]  ? Moved to [place] in 1988.  ? In [place] in 1999  ? Lives at home with daughter on [address].  ? ?Social Determinants of Health  ? ?Financial Resource Strain: Not on file  ?Food Insecurity: Not on file  ?Transportation Needs: Not on file  ?Physical Activity: Not on file  ?Stress: Not on file  ?Social Connections: Not on file  ?Intimate Partner Violence: Not on file  ? ? ?Outpatient Medications Prior to Visit  ?Medication Sig Dispense Refill  ? albuterol (PROVENTIL) (2.5 MG/3ML) 0.083% nebulizer solution Take 3 mLs (2.5 mg total) by nebulization every 6 (six) hours as needed for wheezing or shortness of breath. 75 mL 6  ? albuterol (VENTOLIN HFA) 108 (90 Base) MCG/ACT inhaler INHALE 2 PUFFS INTO THE LUNGS EVERY 4 HOURS AS NEEDED FOR WHEEZING OR SHORTNESS OF BREATH. 18 g 2  ? amitriptyline (ELAVIL) 25 MG tablet TAKE 1 TABLET BY MOUTH NIGHTLY AT BEDTIME 30 tablet 11  ? cetirizine (ZYRTEC) 10 MG tablet Take 1 tablet (10 mg total) by mouth daily. 30 tablet 11  ? cyclobenzaprine (FLEXERIL) 5 MG tablet Take 1 tablet (5 mg total) by mouth 3 (three) times daily as needed for muscle spasms. 10 tablet 0  ? esomeprazole (NEXIUM) 40 MG capsule Take 1 capsule (40 mg total) by mouth daily at 12 noon. 30 capsule 1  ? fluticasone-salmeterol (ADVAIR DISKUS) 100-50 MCG/ACT AEPB Inhale 1 puff into the lungs 2 (two) times daily. 60 each 11  ? ibuprofen (ADVIL) 800 MG tablet Take 1 tablet (800 mg total) by mouth 3 (three) times daily. 21 tablet 0  ? insulin aspart (NOVOLOG FLEXPEN) 100 UNIT/ML FlexPen Inject  10-14 Units into the skin 3 (three) times daily before meals. 30 mL 11  ? insulin glargine-yfgn (SEMGLEE) 100 UNIT/ML Pen Inject 40 Units into the skin 2 (two) times daily. 72 mL 0  ? Insulin Pen Needle (PEN NEEDLES) 32G X 5 MM MISC 100 each by Does not apply route 2 (two) times daily. 100 each 3  ? MAGNESIUM CITRATE PO Take 1 capsule by mouth every morning.     ? medroxyPROGESTERone (PROVERA) 10 MG tablet 1 tab po qday starting on the first day of the month and for the next 14 days. You should get a period after that. Repeat monthly. 42 tablet 3  ? metFORMIN (GLUCOPHAGE) 500 MG tablet Take 1 tablet (500 mg total) by mouth 2 (two) times daily with a  meal. 60 tablet 11  ? montelukast (SINGULAIR) 10 MG tablet Take 1 tablet (10 mg total) by mouth daily. 30 tablet 11  ? olmesartan-hydrochlorothiazide (BENICAR HCT) 40-25 MG tablet Take 1 tablet by mouth daily. 90 tablet 3  ? promethazine (PHENERGAN) 25 MG tablet Take 25 mg by mouth every 8 (eight) hours as needed.    ? simvastatin (ZOCOR) 5 MG tablet Take 1 tablet (5 mg total) by mouth at bedtime. 30 tablet 11  ? cholestyramine (QUESTRAN) 4 GM/DOSE powder Take 1 packet (4 g total) by mouth daily as needed. 378 g 12  ? EASY COMFORT PEN NEEDLES 32G X 4 MM MISC use as directed test 2 times daily  3  ? esomeprazole (NEXIUM) 20 MG capsule Take 1 capsule (20 mg total) by mouth at bedtime. 90 capsule 3  ? meloxicam (MOBIC) 15 MG tablet Take 1 tablet (15 mg total) by mouth daily. (Patient not taking: Reported on 07/05/2021) 30 tablet 11  ? mupirocin ointment (BACTROBAN) 2 % Apply topically 2 (two) times daily. (Patient not taking: Reported on 07/05/2021)    ? rizatriptan (MAXALT-MLT) 10 MG disintegrating tablet Take 1 tablet (10 mg total) by mouth as needed for migraine. May repeat in 2 hours if needed (Patient not taking: Reported on 07/05/2021) 12 tablet 0  ? ?No facility-administered medications prior to visit.  ? ? ?Allergies  ?Allergen Reactions  ? Honey Shortness Of Breath  ?  Mango Flavor Hives and Swelling  ?  Pt allergic to all mango - tongue and throat swelling  ? Glipizide   ?  Other reaction(s): Other  ? Liraglutide Nausea And Vomiting  ? ? ?ROS ?Review of Systems ? ?  ?Ob

## 2021-07-07 ENCOUNTER — Encounter: Payer: Self-pay | Admitting: Obstetrics and Gynecology

## 2021-07-18 ENCOUNTER — Other Ambulatory Visit: Payer: Self-pay | Admitting: Nurse Practitioner

## 2021-08-08 ENCOUNTER — Ambulatory Visit: Payer: 59 | Admitting: Nurse Practitioner

## 2021-08-10 ENCOUNTER — Encounter: Payer: Self-pay | Admitting: Gastroenterology

## 2021-08-11 MED ORDER — OMEPRAZOLE 40 MG PO CPDR: 40.0000 mg | DELAYED_RELEASE_CAPSULE | Freq: Every day | ORAL | 6 refills | Status: DC

## 2021-08-11 NOTE — Telephone Encounter (Signed)
Patty, ?I think Rovonda is away today. ?Okay for omeprazole 40 mg daily (30 tablets/6 refills). ?Thank you. ? ?

## 2021-08-23 ENCOUNTER — Other Ambulatory Visit: Payer: Self-pay | Admitting: Nurse Practitioner

## 2021-08-31 ENCOUNTER — Other Ambulatory Visit: Payer: Self-pay | Admitting: Gastroenterology

## 2021-08-31 ENCOUNTER — Other Ambulatory Visit: Payer: Self-pay | Admitting: Nurse Practitioner

## 2021-08-31 DIAGNOSIS — G4700 Insomnia, unspecified: Secondary | ICD-10-CM

## 2021-08-31 DIAGNOSIS — E1165 Type 2 diabetes mellitus with hyperglycemia: Secondary | ICD-10-CM

## 2021-09-11 ENCOUNTER — Other Ambulatory Visit: Payer: Self-pay | Admitting: Nurse Practitioner

## 2021-09-13 ENCOUNTER — Ambulatory Visit (INDEPENDENT_AMBULATORY_CARE_PROVIDER_SITE_OTHER): Payer: 59 | Admitting: Obstetrics and Gynecology

## 2021-09-13 ENCOUNTER — Encounter: Payer: Self-pay | Admitting: Obstetrics and Gynecology

## 2021-09-13 VITALS — BP 119/73 | HR 86 | Ht 65.0 in | Wt 272.3 lb

## 2021-09-13 DIAGNOSIS — N939 Abnormal uterine and vaginal bleeding, unspecified: Secondary | ICD-10-CM

## 2021-09-13 MED ORDER — SLYND 4 MG PO TABS
1.0000 | ORAL_TABLET | Freq: Every day | ORAL | 3 refills | Status: DC
Start: 2021-09-13 — End: 2021-10-12

## 2021-09-13 NOTE — Progress Notes (Signed)
Pelvic ultrasound scheduled for 09/25/21 at 9:30 AM. Patient notified.

## 2021-09-13 NOTE — Progress Notes (Signed)
Obstetrics and Gynecology Visit Return Patient Evaluation  Appointment Date: 09/13/2021  Primary Care Provider: [place], [place] for [facility]  Chief Complaint: chronic AUB  History of Present Illness:  Lori Shea is a 46 y.o. with above CC. PMHx of DM2, PCOS, BMI 45s, h/o abnormal paps, 01/2019 Filshie BTL.   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 Plan at that visit was to do cyclic provera  Interval History: Since that time, she states that she is doing the cyclic provera and still doesn't have a consistent period; she does not have any menopausal s/s. She states her period now started Monday and that she has cramping with the provera a week before and week after her period. She had very little VB with just dark brown spotting in November, December and January and no period in February and march and she barely had a period again in April.   Review of Systems: as noted in the History of Present Illness.   Patient Active Problem List   Diagnosis Date Noted   Stress incontinence of urine 01/02/2021   Skin irritation 01/02/2021   Hepatic steatosis 12/01/2019   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/2019   Hemoglobin A1C greater than 9%, indicating poor diabetic control 03/23/2019   Hyperglycemia 03/23/2019   Class 3 severe obesity due to excess calories with serious comorbidity and body mass index (BMI) of 40.0 to 44.9 in adult ([place]) 03/23/2019   Yeast infection 03/23/2019   BMI 40.0-44.9, adult ([place]) 03/09/2019   Cervical dysplasia 07/31/2017   Alopecia of scalp  05/09/2017   Pain in left toe(s) 12/27/2016   Morbid obesity ([place]) 09/13/2016   Elevated liver enzymes 09/13/2016   Allergy    Asthma    Hypertension 04/23/2013   Irregular periods 02/22/2011   Type 2 diabetes mellitus with hyperglycemia, with long-term current use of insulin ([place]) 04/23/2010   PCOS (polycystic ovarian syndrome) 04/23/2008   Migraines 04/24/2007   Medications:  Nena Alexander. Eugenio Hoes "Juliann Pulse" had no medications administered during this visit. Current Outpatient Medications  Medication Sig Dispense Refill   albuterol (PROVENTIL) (2.5 MG/3ML) 0.083% nebulizer solution Take 3 mLs (2.5 mg total) by nebulization every 6 (six) hours as needed for wheezing or shortness of breath. 75 mL 6   albuterol (VENTOLIN HFA) 108 (90 Base) MCG/ACT inhaler INHALE 2 PUFFS INTO THE LUNGS EVERY 4 HOURS AS NEEDED FOR FOR WHEEZING OR SHORTNESS OF BREATH 8.5 g 2   amitriptyline (ELAVIL) 25 MG tablet TAKE 1 TABLET BY MOUTH NIGHTLY AT BEDTIME 30 tablet 11   cetirizine (ZYRTEC) 10 MG tablet Take 1 tablet (10 mg total) by mouth daily. 30 tablet 11   cholestyramine (QUESTRAN) 4 GM/DOSE powder Take 1 packet (4 g total) by mouth daily as needed. 378 g 12   cyclobenzaprine (FLEXERIL) 5 MG tablet Take 1 tablet (5 mg total) by mouth 3 (three) times daily as needed for muscle spasms. 10 tablet 0   Drospirenone (SLYND) 4 MG TABS Take 1 tablet by mouth daily. 90 tablet 3   EASY COMFORT PEN NEEDLES 32G X 4 MM MISC  use as directed test 2 times daily  3   FARXIGA 5 MG TABS tablet TAKE 1 TABLET BY MOUTH EVERY DAY 30 tablet 2   fluticasone-salmeterol (ADVAIR DISKUS) 100-50 MCG/ACT AEPB Inhale 1 puff into the lungs 2 (two) times daily. 60 each 11   ibuprofen (ADVIL) 800 MG tablet Take 1 tablet (800 mg total) by mouth 3 (three) times daily. 21 tablet 0   insulin aspart (NOVOLOG FLEXPEN) 100 UNIT/ML FlexPen Inject 10-14 Units into the skin 3 (three) times daily before meals. 30 mL 11   insulin glargine-yfgn (SEMGLEE) 100  UNIT/ML Pen Inject 40 units below the skin 2 times daily. 60 mL 0   Insulin Pen Needle (PEN NEEDLES) 32G X 5 MM MISC 100 each by Does not apply route 2 (two) times daily. 100 each 3   MAGNESIUM CITRATE PO Take 1 capsule by mouth every morning.      medroxyPROGESTERone (PROVERA) 10 MG tablet 1 tab po qday starting on the first day of the month and for the next 14 days. You should get a period after that. Repeat monthly. 42 tablet 3   metFORMIN (GLUCOPHAGE) 500 MG tablet Take 1 tablet (500 mg total) by mouth 2 (two) times daily with a meal. 60 tablet 11   montelukast (SINGULAIR) 10 MG tablet Take 1 tablet (10 mg total) by mouth daily. 30 tablet 11   olmesartan-hydrochlorothiazide (BENICAR HCT) 40-25 MG tablet Take 1 tablet by mouth daily. 90 tablet 3   omeprazole (PRILOSEC) 40 MG capsule Take 1 capsule (40 mg total) by mouth daily. 30 capsule 6   promethazine (PHENERGAN) 25 MG tablet Take 25 mg by mouth every 8 (eight) hours as needed.     rizatriptan (MAXALT-MLT) 10 MG disintegrating tablet Take 1 tablet (10 mg total) by mouth as needed for migraine. May repeat in 2 hours if needed 12 tablet 0   simvastatin (ZOCOR) 5 MG tablet Take 1 tablet (5 mg total) by mouth at bedtime. 30 tablet 11   meloxicam (MOBIC) 15 MG tablet Take 1 tablet (15 mg total) by mouth daily. (Patient not taking: Reported on 07/05/2021) 30 tablet 11   mupirocin ointment (BACTROBAN) 2 % Apply topically 2 (two) times daily. (Patient not taking: Reported on 07/05/2021)     No current facility-administered medications for this visit.    Allergies: is allergic to honey, mango flavor, glipizide, and liraglutide.  Physical Exam:  BP 119/73   Pulse 86   Ht '5\' 5"'$  (1.651 m)   Wt 272 lb 4.8 oz (123.5 kg)   LMP 09/11/2021 (Exact Date)   BMI 45.31 kg/m  Body mass index is 45.31 kg/m. General appearance: Well nourished, well developed female in no acute distress.  Neuro/Psych:  Normal mood and affect.     Assessment: pt  stable  Plan:  1. Abnormal uterine bleeding (AUB) Medical and surgical options d/w her. Plan is for [facility] if her insurance covers it with [facility] sent in today. In the interim, will get an u/s. If [facility] is covered, she will do that. If not, then at her u/s follow up visit I told her that will d/w her more re: other medical and surgical options with other medical options being LNG IUD, depo provera and ablation or hysterectomy for surgical options - US PELVIC COMPLETE WITH TRANSVAGINAL; Future  2. Abnormal pap Repeat pap and HPV later this year  RTC: after u /s  Durene Romans MD Attending Center for [facility])

## 2021-09-14 ENCOUNTER — Ambulatory Visit (INDEPENDENT_AMBULATORY_CARE_PROVIDER_SITE_OTHER): Payer: 59 | Admitting: Nurse Practitioner

## 2021-09-14 VITALS — BP 114/74 | HR 91 | Temp 97.6°F | Ht 65.0 in | Wt 272.8 lb

## 2021-09-14 DIAGNOSIS — R7309 Other abnormal glucose: Secondary | ICD-10-CM

## 2021-09-14 DIAGNOSIS — J45909 Unspecified asthma, uncomplicated: Secondary | ICD-10-CM

## 2021-09-14 DIAGNOSIS — M2550 Pain in unspecified joint: Secondary | ICD-10-CM

## 2021-09-14 DIAGNOSIS — M545 Low back pain, unspecified: Secondary | ICD-10-CM

## 2021-09-14 DIAGNOSIS — J4530 Mild persistent asthma, uncomplicated: Secondary | ICD-10-CM

## 2021-09-14 DIAGNOSIS — E782 Mixed hyperlipidemia: Secondary | ICD-10-CM

## 2021-09-14 DIAGNOSIS — I10 Essential (primary) hypertension: Secondary | ICD-10-CM

## 2021-09-14 DIAGNOSIS — E1169 Type 2 diabetes mellitus with other specified complication: Secondary | ICD-10-CM

## 2021-09-14 DIAGNOSIS — J329 Chronic sinusitis, unspecified: Secondary | ICD-10-CM | POA: Diagnosis not present

## 2021-09-14 DIAGNOSIS — Z01419 Encounter for gynecological examination (general) (routine) without abnormal findings: Secondary | ICD-10-CM

## 2021-09-14 DIAGNOSIS — E7849 Other hyperlipidemia: Secondary | ICD-10-CM

## 2021-09-14 DIAGNOSIS — E119 Type 2 diabetes mellitus without complications: Secondary | ICD-10-CM | POA: Diagnosis not present

## 2021-09-14 DIAGNOSIS — G43001 Migraine without aura, not intractable, with status migrainosus: Secondary | ICD-10-CM

## 2021-09-14 DIAGNOSIS — Z6841 Body Mass Index (BMI) 40.0 and over, adult: Secondary | ICD-10-CM

## 2021-09-14 DIAGNOSIS — Z794 Long term (current) use of insulin: Secondary | ICD-10-CM

## 2021-09-14 MED ORDER — NOVOLOG FLEXPEN 100 UNIT/ML ~~LOC~~ SOPN: 10.0000 [IU] | PEN_INJECTOR | Freq: Three times a day (TID) | SUBCUTANEOUS | 11 refills | Status: DC

## 2021-09-14 MED ORDER — RIZATRIPTAN BENZOATE 10 MG PO TBDP: 10.0000 mg | ORAL_TABLET | ORAL | 0 refills | Status: DC | PRN

## 2021-09-14 MED ORDER — CETIRIZINE HCL 10 MG PO TABS: 10.0000 mg | ORAL_TABLET | Freq: Every day | ORAL | 11 refills | Status: DC

## 2021-09-14 MED ORDER — SIMVASTATIN 10 MG PO TABS: 10.0000 mg | ORAL_TABLET | Freq: Every day | ORAL | 3 refills | Status: DC

## 2021-09-14 MED ORDER — FLUTICASONE-SALMETEROL 100-50 MCG/ACT IN AEPB: 1.0000 | INHALATION_SPRAY | Freq: Two times a day (BID) | RESPIRATORY_TRACT | 11 refills | Status: DC

## 2021-09-14 MED ORDER — MONTELUKAST SODIUM 10 MG PO TABS: 10.0000 mg | ORAL_TABLET | Freq: Every day | ORAL | 11 refills | Status: DC

## 2021-09-14 MED ORDER — MELOXICAM 15 MG PO TABS: 15.0000 mg | ORAL_TABLET | Freq: Every day | ORAL | 11 refills | Status: AC

## 2021-09-14 MED ORDER — METFORMIN HCL 500 MG PO TABS: 500.0000 mg | ORAL_TABLET | Freq: Two times a day (BID) | ORAL | 11 refills | Status: DC

## 2021-09-14 MED ORDER — EASY COMFORT PEN NEEDLES 32G X 4 MM MISC: 3 refills | Status: DC

## 2021-09-14 MED ORDER — MEDROXYPROGESTERONE ACETATE 10 MG PO TABS: ORAL_TABLET | ORAL | 3 refills | Status: DC

## 2021-09-14 MED ORDER — OLMESARTAN MEDOXOMIL-HCTZ 40-25 MG PO TABS: 1.0000 | ORAL_TABLET | Freq: Every day | ORAL | 3 refills | Status: DC

## 2021-09-14 MED ORDER — PEN NEEDLES 32G X 5 MM MISC: 100.0000 | Freq: Two times a day (BID) | 3 refills | Status: DC

## 2021-09-14 MED ORDER — IBUPROFEN 800 MG PO TABS: 800.0000 mg | ORAL_TABLET | Freq: Three times a day (TID) | ORAL | 0 refills | Status: DC

## 2021-09-14 NOTE — Progress Notes (Signed)
[place], [place]  [address] Phone:  [phone]   Fax:  [phone] Subjective:   Patient ID: Lori Shea, female    DOB: 1975-12-05, 46 y.o.   MRN: [id number]  Chief Complaint  Patient presents with   Follow-up    4 week follow up;Diabetes Patient is needing a refill on all her medications and wants her a1c checked as well.     HPI Lori Shea 46 y.o. female  has a past medical history of Allergy, Anemia, Anxiety, Arthritis, Asthma (age 66 yo), Cholelithiasis (age 78 yo), Closed avulsion fracture of lateral malleolus of right fibula (12/27/2016), COVID-19, Diabetes mellitus (2012), Elevated liver enzymes (09/13/2016), GERD (gastroesophageal reflux disease), Heart murmur, History of kidney stones, Hypertension (2015), Kidney stones, Migraines (2009), Morbid obesity ([place]), PCOS (polycystic ovarian syndrome) (2010), and Pneumonia. To the [facility] for reevaluation of DM2.   Diabetes Mellitus: Patient presents for follow up of diabetes. Symptoms: none. Patient denies any symptoms. Patient denies foot ulcerations, hypoglycemia , and nausea.  Evaluation to date has been included: hemoglobin A1C.  Home sugars: BGs range between 120 and 130 . Treatment to date: no recent interventions.   Patient requesting to slowly be removed from insulin, suspects she needs a referral to endocrinology. States that she has been managing diet and exercising, walking two miles per day. Denies any other concerns today. Requesting refill of medications, will be in [place] for the next two months.   Denies any fatigue, chest pain, shortness of breath, HA or dizziness. Denies any blurred vision, numbness or tingling.   Past Medical History:  Diagnosis Date   Allergy    Foods:  mango, honey, corn.  Allergic to "everything tested"  15 years ago in [place]   Anemia    with pregnancy   Anxiety    Arthritis    Shoulder and hips   Asthma age 30 yo    Triggers:  cold weather, exercise, allergens, anxiety, URIs   Cholelithiasis age 2 yo   Asymptomatic   Closed avulsion fracture of lateral malleolus of right fibula 12/27/2016   COVID-19    Diabetes mellitus 2012   Was diagnosed 2 years earlier with PCOS   Elevated liver enzymes 09/13/2016   GERD (gastroesophageal reflux disease)    Heart murmur    with pregnancy   History of kidney stones    Hypertension 2015   Kidney stones    family history of renal failure   Migraines 2009   Morbid obesity ([place])    PCOS (polycystic ovarian syndrome) 2010   Pneumonia     Past Surgical History:  Procedure Laterality Date   CHOLECYSTECTOMY N/A 12/01/2019   Procedure: LAPAROSCOPIC CHOLECYSTECTOMY;  Surgeon: Armandina Gemma, MD;  Location: [facility];  Service: General;  Laterality: N/A;   COLPOSCOPY N/A    CYST EXCISION     LAPAROSCOPIC TUBAL LIGATION Bilateral 01/28/2019   Procedure: LAPAROSCOPIC TUBAL LIGATION AND PAP SMEAR;  Surgeon: Aletha Halim, MD;  Location: [place];  Service: Gynecology;  Laterality: Bilateral;   TONSILLECTOMY AND ADENOIDECTOMY Bilateral 12/23/2018   Procedure: TONSILLECTOMY AND ADENOIDECTOMY;  Surgeon: Leta Baptist, MD;  Location: [facility];  Service: ENT;  Laterality: Bilateral;    Family History  Problem Relation Age of Onset   Healthy Mother    Diabetes Father    Hyperlipidemia Father    Hypertension Father    Heart disease Father    Kidney disease Father  Developed after 2nd CABG   Colon polyps Father    Diabetes Paternal Grandfather    Heart disease Paternal Grandfather    Hyperlipidemia Paternal Grandfather    Hypertension Paternal Grandfather    Kidney disease Paternal Grandfather     Social History   Socioeconomic History   Marital status: Divorced    Spouse name: Not on file   Number of children: 1   Years of education: some comm. college   Highest education level: Not on file  Occupational History    Occupation: CNA    Comment: [employer]  Tobacco Use   Smoking status: Never   Smokeless tobacco: Never  Vaping Use   Vaping Use: Never used  Substance and Sexual Activity   Alcohol use: No   Drug use: No   Sexual activity: Not Currently    Birth control/protection: Surgical    Comment: Sprintec.  Tubal ligation  Other Topics Concern   Not on file  Social History Narrative   Originally from [place], [place] to [place] in 1988.   In [place] in [place] at home with daughter on [place].   Social Determinants of Health   Financial Resource Strain: Not on file  Food Insecurity: Not on file  Transportation Needs: Not on file  Physical Activity: Not on file  Stress: Not on file  Social Connections: Not on file  Intimate Partner Violence: Not on file    Outpatient Medications Prior to Visit  Medication Sig Dispense Refill   albuterol (PROVENTIL) (2.5 MG/3ML) 0.083% nebulizer solution Take 3 mLs (2.5 mg total) by nebulization every 6 (six) hours as needed for wheezing or shortness of breath. 75 mL 6   albuterol (VENTOLIN HFA) 108 (90 Base) MCG/ACT inhaler INHALE 2 PUFFS INTO THE LUNGS EVERY 4 HOURS AS NEEDED FOR FOR WHEEZING OR SHORTNESS OF BREATH 8.5 g 2   amitriptyline (ELAVIL) 25 MG tablet TAKE 1 TABLET BY MOUTH NIGHTLY AT BEDTIME 30 tablet 11   cholestyramine (QUESTRAN) 4 GM/DOSE powder Take 1 packet (4 g total) by mouth daily as needed. 378 g 12   cyclobenzaprine (FLEXERIL) 5 MG tablet Take 1 tablet (5 mg total) by mouth 3 (three) times daily as needed for muscle spasms. 10 tablet 0   Drospirenone (SLYND) 4 MG TABS Take 1 tablet by mouth daily. 90 tablet 3   FARXIGA 5 MG TABS tablet TAKE 1 TABLET BY MOUTH EVERY DAY 30 tablet 2   insulin glargine-yfgn (SEMGLEE) 100 UNIT/ML Pen Inject 40 units below the skin 2 times daily. 60 mL 0   MAGNESIUM CITRATE PO Take 1 capsule by mouth every morning.      omeprazole (PRILOSEC) 40 MG capsule Take 1  capsule (40 mg total) by mouth daily. 30 capsule 6   promethazine (PHENERGAN) 25 MG tablet Take 25 mg by mouth every 8 (eight) hours as needed.     cetirizine (ZYRTEC) 10 MG tablet Take 1 tablet (10 mg total) by mouth daily. 30 tablet 11   EASY COMFORT PEN NEEDLES 32G X 4 MM MISC use as directed test 2 times daily  3   fluticasone-salmeterol (ADVAIR DISKUS) 100-50 MCG/ACT AEPB Inhale 1 puff into the lungs 2 (two) times daily. 60 each 11   ibuprofen (ADVIL) 800 MG tablet Take 1 tablet (800 mg total) by mouth 3 (three) times daily. 21 tablet 0   insulin aspart (NOVOLOG FLEXPEN) 100 UNIT/ML FlexPen Inject 10-14 Units into the skin 3 (three)  times daily before meals. 30 mL 11   Insulin Pen Needle (PEN NEEDLES) 32G X 5 MM MISC 100 each by Does not apply route 2 (two) times daily. 100 each 3   metFORMIN (GLUCOPHAGE) 500 MG tablet Take 1 tablet (500 mg total) by mouth 2 (two) times daily with a meal. 60 tablet 11   montelukast (SINGULAIR) 10 MG tablet Take 1 tablet (10 mg total) by mouth daily. 30 tablet 11   olmesartan-hydrochlorothiazide (BENICAR HCT) 40-25 MG tablet Take 1 tablet by mouth daily. 90 tablet 3   rizatriptan (MAXALT-MLT) 10 MG disintegrating tablet Take 1 tablet (10 mg total) by mouth as needed for migraine. May repeat in 2 hours if needed 12 tablet 0   simvastatin (ZOCOR) 5 MG tablet Take 1 tablet (5 mg total) by mouth at bedtime. 30 tablet 11   mupirocin ointment (BACTROBAN) 2 % Apply topically 2 (two) times daily. (Patient not taking: Reported on 07/05/2021)     medroxyPROGESTERone (PROVERA) 10 MG tablet 1 tab po qday starting on the first day of the month and for the next 14 days. You should get a period after that. Repeat monthly. 42 tablet 3   meloxicam (MOBIC) 15 MG tablet Take 1 tablet (15 mg total) by mouth daily. (Patient not taking: Reported on 07/05/2021) 30 tablet 11   No facility-administered medications prior to visit.    Allergies  Allergen Reactions   Honey Shortness Of  Breath   Mango Flavor Hives and Swelling    Pt allergic to all mango - tongue and throat swelling   Glipizide     Other reaction(s): Other   Liraglutide Nausea And Vomiting    Review of Systems  Constitutional: Negative.  Negative for chills, fever and malaise/fatigue.  Eyes: Negative.   Respiratory:  Negative for cough and shortness of breath.   Cardiovascular:  Negative for chest pain, palpitations and leg swelling.  Gastrointestinal:  Negative for abdominal pain, blood in stool, constipation, diarrhea, nausea and vomiting.  Musculoskeletal: Negative.   Skin: Negative.   Neurological: Negative.   Psychiatric/Behavioral:  Negative for depression. The patient is not nervous/anxious.   All other systems reviewed and are negative.     Objective:    Physical Exam Vitals reviewed.  Constitutional:      General: She is not in acute distress.    Appearance: Normal appearance. She is obese.  HENT:     Head: Normocephalic.  Eyes:     General: No scleral icterus.       Right eye: No discharge.        Left eye: No discharge.     Extraocular Movements: Extraocular movements intact.     Conjunctiva/sclera: Conjunctivae normal.     Pupils: Pupils are equal, round, and reactive to light.  Neck:     Vascular: No carotid bruit.  Cardiovascular:     Rate and Rhythm: Normal rate and regular rhythm.     Pulses: Normal pulses.     Heart sounds: Normal heart sounds.     Comments: No obvious peripheral edema Pulmonary:     Effort: Pulmonary effort is normal.     Breath sounds: Normal breath sounds.  Musculoskeletal:     Cervical back: Normal range of motion and neck supple. No rigidity or tenderness.  Lymphadenopathy:     Cervical: No cervical adenopathy.  Skin:    General: Skin is warm and dry.     Capillary Refill: Capillary refill takes less than 2 seconds.  Neurological:  General: No focal deficit present.     Mental Status: She is alert and oriented to person, place, and  time.  Psychiatric:        Mood and Affect: Mood normal.        Behavior: Behavior normal.        Thought Content: Thought content normal.        Judgment: Judgment normal.    BP 114/74   Pulse 91   Temp 97.6 F (36.4 C)   Ht _0  (1.651 m)   Wt 272 lb 12.8 oz (123.7 kg)   LMP 09/11/2021 (Exact Date)   SpO2 99%   BMI 45.40 kg/m  Wt Readings from Last 3 Encounters:  09/14/21 272 lb 12.8 oz (123.7 kg)  09/13/21 272 lb 4.8 oz (123.5 kg)  07/05/21 273 lb 6.4 oz (124 kg)    Immunization History  Administered Date(s) Administered   Pneumococcal Polysaccharide-23 12/18/2018   Pneumococcal-Unspecified 04/23/1997   Td 09/06/2004   Tdap 09/13/2016    Diabetic Foot Exam - Simple   No data filed     Lab Results  Component Value Date   TSH 2.780 02/11/2020   Lab Results  Component Value Date   WBC 9.0 01/04/2021   HGB 12.2 01/04/2021   HCT 36.0 01/04/2021   MCV 84.6 01/04/2021   PLT 260 01/04/2021   Lab Results  Component Value Date   NA 140 04/05/2021   K 4.3 04/05/2021   CO2 24 01/04/2021   GLUCOSE 213 (H) 04/05/2021   BUN 12 04/05/2021   CREATININE 0.95 04/05/2021   BILITOT 0.2 04/05/2021   ALKPHOS 103 04/05/2021   AST 25 04/05/2021   ALT 33 01/04/2021   PROT 6.7 04/05/2021   ALBUMIN 4.3 04/05/2021   CALCIUM 9.1 04/05/2021   ANIONGAP 9 01/04/2021   EGFR 75 04/05/2021   Lab Results  Component Value Date   CHOL 167 09/14/2021   CHOL 129 12/28/2020   CHOL 146 09/26/2020   Lab Results  Component Value Date   HDL 40 09/14/2021   HDL 36 (L) 12/28/2020   HDL 41 09/26/2020   Lab Results  Component Value Date   LDLCALC 95 09/14/2021   LDLCALC 60 12/28/2020   LDLCALC 67 09/26/2020   Lab Results  Component Value Date   TRIG 185 (H) 09/14/2021   TRIG 203 (H) 12/28/2020   TRIG 230 (H) 09/26/2020   Lab Results  Component Value Date   CHOLHDL 4.2 09/14/2021   CHOLHDL 3.6 12/28/2020   CHOLHDL 3.6 09/26/2020   Lab Results  Component Value Date    HGBA1C 8.6 (H) 09/14/2021   HGBA1C 9.9 (A) 07/05/2021   HGBA1C 9.9 07/05/2021   HGBA1C 9.9 (A) 07/05/2021   HGBA1C 9.9 (A) 07/05/2021       Assessment & Plan:   Problem List Items Addressed This Visit       Cardiovascular and Mediastinum   Hypertension   Relevant Medications   olmesartan-hydrochlorothiazide (BENICAR HCT) 40-25 MG tablet   simvastatin (ZOCOR) 10 MG tablet Encouraged continued diet and exercise efforts  Encouraged continued compliance with medication     Migraines   Relevant Medications   ibuprofen (ADVIL) 800 MG tablet   olmesartan-hydrochlorothiazide (BENICAR HCT) 40-25 MG tablet   rizatriptan (MAXALT-MLT) 10 MG disintegrating tablet   meloxicam (MOBIC) 15 MG tablet   simvastatin (ZOCOR) 10 MG tablet     Respiratory   Asthma   Relevant Medications   fluticasone-salmeterol (ADVAIR DISKUS) 100-50 MCG/ACT AEPB  montelukast (SINGULAIR) 10 MG tablet   Other Relevant Orders   Lipid panel (Completed)     Endocrine   Hemoglobin A1C greater than 9%, indicating poor diabetic control   Relevant Medications   EASY COMFORT PEN NEEDLES 32G X 4 MM MISC   insulin aspart (NOVOLOG FLEXPEN) 100 UNIT/ML FlexPen   Insulin Pen Needle (PEN NEEDLES) 32G X 5 MM MISC   metFORMIN (GLUCOPHAGE) 500 MG tablet   olmesartan-hydrochlorothiazide (BENICAR HCT) 40-25 MG tablet   simvastatin (ZOCOR) 10 MG tablet   Other Relevant Orders   Ambulatory referral to Endocrinology     Other   Class 3 severe obesity due to excess calories with serious comorbidity and body mass index (BMI) of 40.0 to 44.9 in adult [facility]) - Primary   Relevant Medications   insulin aspart (NOVOLOG FLEXPEN) 100 UNIT/ML FlexPen   metFORMIN (GLUCOPHAGE) 500 MG tablet   Other Visit Diagnoses     Chronic sinusitis, unspecified location       Relevant Medications   cetirizine (ZYRTEC) 10 MG tablet   Type 2 diabetes mellitus without complication, with long-term current use of insulin (HCC)       Relevant  Medications   insulin aspart (NOVOLOG FLEXPEN) 100 UNIT/ML FlexPen   metFORMIN (GLUCOPHAGE) 500 MG tablet   olmesartan-hydrochlorothiazide (BENICAR HCT) 40-25 MG tablet   simvastatin (ZOCOR) 10 MG tablet   Other Relevant Orders   Hemoglobin A1c (Completed)   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 (HCC)       Relevant Medications   insulin aspart (NOVOLOG FLEXPEN) 100 UNIT/ML FlexPen   metFORMIN (GLUCOPHAGE) 500 MG tablet   olmesartan-hydrochlorothiazide (BENICAR HCT) 40-25 MG tablet   simvastatin (ZOCOR) 10 MG tablet Encouraged continued diet and exercise efforts  Encouraged continued compliance with medication     Well woman exam with routine gynecological exam       Relevant Medications   medroxyPROGESTERone (PROVERA) 10 MG tablet   Acute right-sided low back pain, unspecified whether sciatica present       Relevant Medications   ibuprofen (ADVIL) 800 MG tablet   meloxicam (MOBIC) 15 MG tablet   Other hyperlipidemia       Relevant Medications   olmesartan-hydrochlorothiazide (BENICAR HCT) 40-25 MG tablet   simvastatin (ZOCOR) 10 MG tablet   Arthralgia, unspecified joint       Relevant Medications   ibuprofen (ADVIL) 800 MG tablet  All medications refilled without change   Follow up in 3 mths for reevaluation of chronic illness, sooner as needed     I have discontinued Nena Alexander. Timpone "Kathy"'s simvastatin. I am also having her start on simvastatin. Additionally, I am having her maintain her MAGNESIUM CITRATE PO, albuterol, cyclobenzaprine, mupirocin ointment, promethazine, cholestyramine, omeprazole, insulin glargine-yfgn, Farxiga, amitriptyline, albuterol, Slynd, cetirizine, Easy Comfort Pen Needles, fluticasone-salmeterol, ibuprofen, NovoLOG FlexPen, Pen Needles, metFORMIN, montelukast, olmesartan-hydrochlorothiazide, rizatriptan, medroxyPROGESTERone, and meloxicam.  Meds ordered this  encounter  Medications   cetirizine (ZYRTEC) 10 MG tablet    Sig: Take 1 tablet (10 mg total) by mouth daily.    Dispense:  30 tablet    Refill:  11   EASY COMFORT PEN NEEDLES 32G X 4 MM MISC    Sig: use as directed test 2 times daily    Dispense:  100 each    Refill:  3   fluticasone-salmeterol (ADVAIR DISKUS) 100-50 MCG/ACT AEPB    Sig: Inhale 1 puff into the lungs 2 (two) times  daily.    Dispense:  60 each    Refill:  11   ibuprofen (ADVIL) 800 MG tablet    Sig: Take 1 tablet (800 mg total) by mouth 3 (three) times daily.    Dispense:  21 tablet    Refill:  0   insulin aspart (NOVOLOG FLEXPEN) 100 UNIT/ML FlexPen    Sig: Inject 10-14 Units into the skin 3 (three) times daily before meals.    Dispense:  30 mL    Refill:  11   Insulin Pen Needle (PEN NEEDLES) 32G X 5 MM MISC    Sig: 100 each by Does not apply route 2 (two) times daily.    Dispense:  100 each    Refill:  3   metFORMIN (GLUCOPHAGE) 500 MG tablet    Sig: Take 1 tablet (500 mg total) by mouth 2 (two) times daily with a meal.    Dispense:  60 tablet    Refill:  11    This prescription was filled on 10/14/2020. Any refills authorized will be placed on file.   montelukast (SINGULAIR) 10 MG tablet    Sig: Take 1 tablet (10 mg total) by mouth daily.    Dispense:  30 tablet    Refill:  11    This prescription was filled on 10/14/2020. Any refills authorized will be placed on file.   olmesartan-hydrochlorothiazide (BENICAR HCT) 40-25 MG tablet    Sig: Take 1 tablet by mouth daily.    Dispense:  90 tablet    Refill:  3   rizatriptan (MAXALT-MLT) 10 MG disintegrating tablet    Sig: Take 1 tablet (10 mg total) by mouth as needed for migraine. May repeat in 2 hours if needed    Dispense:  12 tablet    Refill:  0   medroxyPROGESTERone (PROVERA) 10 MG tablet    Sig: 1 tab po qday starting on the first day of the month and for the next 14 days. You should get a period after that. Repeat monthly.    Dispense:  42 tablet     Refill:  3   meloxicam (MOBIC) 15 MG tablet    Sig: Take 1 tablet (15 mg total) by mouth daily.    Dispense:  30 tablet    Refill:  11    This prescription was filled on 10/14/2020. Any refills authorized will be placed on file.   simvastatin (ZOCOR) 10 MG tablet    Sig: Take 1 tablet (10 mg total) by mouth at bedtime.    Dispense:  90 tablet    Refill:  3     Teena Dunk, NP

## 2021-09-14 NOTE — Patient Instructions (Signed)
You were seen today in the [facility] for reevaluation of DM. Labs were collected, results will be available via MyChart or, if abnormal, you will be contacted by clinic staff. You were prescribed medications, please take as directed. Please follow up in 3 mths for reevaluation of chronic illness.

## 2021-09-15 LAB — LIPID PANEL
Chol/HDL Ratio: 4.2 ratio (ref 0.0–4.4)
Cholesterol, Total: 167 mg/dL (ref 100–199)
HDL: 40 mg/dL (ref >39)
LDL Chol Calc (NIH): 95 mg/dL (ref 0–99)
Triglycerides: 185 mg/dL — ABNORMAL HIGH (ref 0–149)
VLDL Cholesterol Cal: 32 mg/dL (ref 5–40)

## 2021-09-15 LAB — HEMOGLOBIN A1C
Est. average glucose Bld gHb Est-mCnc: 200 mg/dL
Hgb A1c MFr Bld: 8.6 % — ABNORMAL HIGH (ref 4.8–5.6)

## 2021-09-19 ENCOUNTER — Encounter: Payer: Self-pay | Admitting: Nurse Practitioner

## 2021-09-19 ENCOUNTER — Telehealth: Payer: Self-pay

## 2021-09-19 ENCOUNTER — Other Ambulatory Visit: Payer: Self-pay | Admitting: Nurse Practitioner

## 2021-09-19 DIAGNOSIS — E1165 Type 2 diabetes mellitus with hyperglycemia: Secondary | ICD-10-CM

## 2021-09-19 MED ORDER — DAPAGLIFLOZIN PROPANEDIOL 5 MG PO TABS: 10.0000 mg | ORAL_TABLET | Freq: Every day | ORAL | 2 refills | Status: DC

## 2021-09-19 MED ORDER — DAPAGLIFLOZIN PROPANEDIOL 5 MG PO TABS: 10.0000 mg | ORAL_TABLET | Freq: Every day | ORAL | 0 refills | Status: AC

## 2021-09-19 NOTE — Telephone Encounter (Signed)
Farxiga   Change dosage to 60 quantity

## 2021-09-25 ENCOUNTER — Telehealth: Payer: Self-pay | Admitting: Family Medicine

## 2021-09-25 ENCOUNTER — Ambulatory Visit
Admission: RE | Admit: 2021-09-25 | Discharge: 2021-09-25 | Disposition: A | Discharge status: Home / Self Care | Payer: 59 | Source: Ambulatory Visit | Attending: Obstetrics and Gynecology | Admitting: Obstetrics and Gynecology

## 2021-09-25 ENCOUNTER — Other Ambulatory Visit: Payer: Self-pay | Admitting: Gastroenterology

## 2021-09-25 DIAGNOSIS — N939 Abnormal uterine and vaginal bleeding, unspecified: Secondary | ICD-10-CM | POA: Insufficient documentation

## 2021-09-25 NOTE — Telephone Encounter (Signed)
Patient stated she has still been unable to receive her birth control pills through her insurance.

## 2021-09-26 ENCOUNTER — Other Ambulatory Visit: Payer: Self-pay

## 2021-09-26 ENCOUNTER — Telehealth: Payer: Self-pay

## 2021-09-26 DIAGNOSIS — E1165 Type 2 diabetes mellitus with hyperglycemia: Secondary | ICD-10-CM

## 2021-09-26 NOTE — Telephone Encounter (Signed)
Farxiga '5mg'$  Tablets  Prior authorization was started on 09/26/21 for the quantity of 60 tablets and not 180 tablets.

## 2021-09-26 NOTE — Telephone Encounter (Signed)
Approvedtoday PA Case: [id number], Status: Approved, Coverage Starts on: 09/26/2021 12:00 AM, Coverage Ends on: 09/27/2022 12:00 AM.

## 2021-10-02 ENCOUNTER — Encounter: Payer: Self-pay | Admitting: Obstetrics and Gynecology

## 2021-10-02 ENCOUNTER — Ambulatory Visit: Payer: 59 | Admitting: Obstetrics and Gynecology

## 2021-10-02 ENCOUNTER — Ambulatory Visit (INDEPENDENT_AMBULATORY_CARE_PROVIDER_SITE_OTHER): Payer: 59 | Admitting: Obstetrics and Gynecology

## 2021-10-02 VITALS — BP 121/82 | HR 96 | Ht 65.0 in | Wt 272.3 lb

## 2021-10-02 DIAGNOSIS — N946 Dysmenorrhea, unspecified: Secondary | ICD-10-CM

## 2021-10-02 NOTE — Patient Instructions (Signed)
Novasure ablation

## 2021-10-02 NOTE — Progress Notes (Signed)
Obstetrics and Gynecology Visit Return Patient Evaluation  Appointment Date: 10/02/2021  Primary Care Provider: Edw[facility]: [facility]  Chief Complaint: follow up u/s  History of Present Illness:  Lori Shea is a 46 y.o. with chronic AUB, DM2, PCOS, BMI 46s, h/o abnormal paps, 2020 filshie BTL  Patient last seen 5/24 and u/s ordered. She is being followed for AUB after her BTL, with her being on combined OCPs prior to that without issue. She does not have a consistent, regular monthly period even on the cyclic provera and she does not have any menopasual s/s. When she does have a period with the provera, she doesn't have heavy bleeding but does have bad painful cramping. Prior Sept 2022 embx showed proliferative endometrium with extensive tubal metaplasia.   Interval History: u/s done which was normal (see below).   Left labia bump noted for past few months. She has no pain, even with sex and no prior h/o and just notices it in the shower.   Review of Systems: as noted in the History of Present Illness.  Patient Active Problem List   Diagnosis Date Noted   Stress incontinence of urine 01/02/2021   Skin irritation 01/02/2021   Hepatic steatosis 12/01/2019   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/2019   Hemoglobin A1C greater than 9%, indicating poor diabetic control 03/23/2019   Hyperglycemia 03/23/2019   Class 3 severe obesity due to excess calories with serious comorbidity and body mass index (BMI) of 40.0 to 44.9 in adult ([place]) 03/23/2019   Yeast infection 03/23/2019   BMI 40.0-44.9, adult ([place]) 03/09/2019   Cervical dysplasia 07/31/2017   Alopecia of scalp 05/09/2017   Pain in left toe(s) 12/27/2016   Morbid obesity ([place]) 09/13/2016   Elevated liver enzymes 09/13/2016   Allergy    Asthma    Hypertension 04/23/2013   Irregular periods 02/22/2011   Type 2 diabetes mellitus with hyperglycemia, with  long-term current use of insulin ([place]) 04/23/2010   PCOS (polycystic ovarian syndrome) 04/23/2008   Migraines 04/24/2007   Medications:  Nena Alexander. Eugenio Hoes "Juliann Pulse" had no medications administered during this visit. Current Outpatient Medications  Medication Sig Dispense Refill   albuterol (PROVENTIL) (2.5 MG/3ML) 0.083% nebulizer solution Take 3 mLs (2.5 mg total) by nebulization every 6 (six) hours as needed for wheezing or shortness of breath. 75 mL 6   albuterol (VENTOLIN HFA) 108 (90 Base) MCG/ACT inhaler INHALE 2 PUFFS INTO THE LUNGS EVERY 4 HOURS AS NEEDED FOR FOR WHEEZING OR SHORTNESS OF BREATH 8.5 g 2   amitriptyline (ELAVIL) 25 MG tablet TAKE 1 TABLET BY MOUTH NIGHTLY AT BEDTIME 30 tablet 11   cetirizine (ZYRTEC) 10 MG tablet Take 1 tablet (10 mg total) by mouth daily. 30 tablet 11   cholestyramine (QUESTRAN) 4 GM/DOSE powder Take 1 packet (4 g total) by mouth daily as needed. 378 g 12   cyclobenzaprine (FLEXERIL) 5 MG tablet Take 1 tablet (5 mg total) by mouth 3 (three) times daily as needed for muscle spasms. 10 tablet 0   dapagliflozin propanediol (FARXIGA) 5 MG TABS tablet Take 2 tablets (10 mg total) by mouth daily. 180 tablet 0   Drospirenone (SLYND) 4 MG TABS Take 1 tablet by mouth daily. 90 tablet 3   EASY COMFORT PEN NEEDLES 32G X 4 MM MISC use as directed test 2 times daily 100 each 3   esomeprazole (NEXIUM) 20 MG capsule Take 20 mg by mouth at bedtime.  fluticasone-salmeterol (ADVAIR DISKUS) 100-50 MCG/ACT AEPB Inhale 1 puff into the lungs 2 (two) times daily. 60 each 11   ibuprofen (ADVIL) 800 MG tablet Take 1 tablet (800 mg total) by mouth 3 (three) times daily. 21 tablet 0   insulin aspart (NOVOLOG FLEXPEN) 100 UNIT/ML FlexPen Inject 10-14 Units into the skin 3 (three) times daily before meals. 30 mL 11   insulin glargine-yfgn (SEMGLEE) 100 UNIT/ML Pen Inject 40 units below the skin 2 times daily. 60 mL 0   Insulin Pen Needle (PEN NEEDLES) 32G X 5 MM MISC 100 each by  Does not apply route 2 (two) times daily. 100 each 3   MAGNESIUM CITRATE PO Take 1 capsule by mouth every morning.      medroxyPROGESTERone (PROVERA) 10 MG tablet 1 tab po qday starting on the first day of the month and for the next 14 days. You should get a period after that. Repeat monthly. 42 tablet 3   meloxicam (MOBIC) 15 MG tablet Take 1 tablet (15 mg total) by mouth daily. 30 tablet 11   metFORMIN (GLUCOPHAGE) 500 MG tablet Take 1 tablet (500 mg total) by mouth 2 (two) times daily with a meal. 60 tablet 11   montelukast (SINGULAIR) 10 MG tablet Take 1 tablet (10 mg total) by mouth daily. 30 tablet 11   mupirocin ointment (BACTROBAN) 2 % Apply topically 2 (two) times daily.     olmesartan-hydrochlorothiazide (BENICAR HCT) 40-25 MG tablet Take 1 tablet by mouth daily. 90 tablet 3   omeprazole (PRILOSEC) 40 MG capsule Take 1 capsule (40 mg total) by mouth daily. 30 capsule 6   promethazine (PHENERGAN) 25 MG tablet Take 25 mg by mouth every 8 (eight) hours as needed.     rizatriptan (MAXALT-MLT) 10 MG disintegrating tablet Take 1 tablet (10 mg total) by mouth as needed for migraine. May repeat in 2 hours if needed 12 tablet 0   simvastatin (ZOCOR) 10 MG tablet Take 1 tablet (10 mg total) by mouth at bedtime. 90 tablet 3   No current facility-administered medications for this visit.    Allergies: is allergic to honey, mango flavor, glipizide, and liraglutide.  Physical Exam:  BP 121/82   Pulse 96   Ht '5\' 5"'$  (1.651 m)   Wt 272 lb 4.8 oz (123.5 kg)   LMP 09/11/2021   BMI 45.31 kg/m  Body mass index is 45.31 kg/m. General appearance: Well nourished, well developed female in no acute distress.  Neuro/Psych:  Normal mood and affect.    Pelvic: EGBUS normal. On the left labia, there is a pinpoint, mobile, nttp, normal overlying skin bump at the 1 o'clock position c/w a benign sebaceous cyst.   Radiology: Narrative & Impression  CLINICAL DATA:  Abnormal uterine bleeding. Prior tubal  ligation. PCOS. Cervical dysplasia. LMP 09/11/2021   EXAM: TRANSABDOMINAL AND TRANSVAGINAL ULTRASOUND OF PELVIS   TECHNIQUE: Both transabdominal and transvaginal ultrasound examinations of the pelvis were performed. Transabdominal technique was performed for global imaging of the pelvis including uterus, ovaries, adnexal regions, and pelvic cul-de-sac. It was necessary to proceed with endovaginal exam following the transabdominal exam to visualize the endometrium, uterus, ovaries, adnexal regions.   COMPARISON:  12/18/2010   FINDINGS: Uterus   Measurements: 7.9 x 4.6 x 6.2 centimeters = volume: 117.2 mL. Uterus is retroflexed and normal in appearance. No fibroids or other mass.   Endometrium   Thickness: 2.6 millimeters.  No focal abnormality visualized.   Right ovary   Measurements: 4.3 x 2.5  x 4.9 centimeters = volume: 27.4 mL. No focal abnormality visualized.   Left ovary   Measurements: 3.9 x 3.1 x 4.6 centimeters = volume: 28.9 mL. Normal appearance/no adnexal mass.   Other findings   No abnormal free fluid. Study quality is degraded by patient body habitus.   IMPRESSION: Normal pelvic ultrasound.   If bleeding remains unresponsive to hormonal or medical therapy, sonohysterogram should be considered for focal lesion work-up. (Ref: Radiological Reasoning: Algorithmic Workup of Abnormal Vaginal Bleeding with Endovaginal Sonography and Sonohysterography. AJR 2008; 222:L79-89)     Electronically Signed   By: Nolon Nations M.D.   On: 09/25/2021 10:52   Assessment: pt stable  Plan:  1. Dysmenorrhea Options d/w her. Slynd not covered so d/w her re: other POPs, Mirena and depo provera. I also d/w her re: ablation and hysterectomy. Mirena recommend as her bleeding isn't heavy and same efficacy at one year as ablation and no need for surgery but patient does not want a Mirena. After d/w her re: r/b, such need for additional surgery, risk of bleeding and  infection, damage to surrounding structures, etc she would like to proceed with endometrial ablation. Request sent.   Repeat pap at her post op visit   RTC: post op  Durene Romans MD Attending Center for [employer] [facility])

## 2021-10-09 ENCOUNTER — Other Ambulatory Visit: Payer: Self-pay | Admitting: Gastroenterology

## 2021-10-11 ENCOUNTER — Other Ambulatory Visit: Payer: Self-pay | Admitting: Obstetrics and Gynecology

## 2021-10-11 DIAGNOSIS — Z01818 Encounter for other preprocedural examination: Secondary | ICD-10-CM

## 2021-10-11 DIAGNOSIS — Z01811 Encounter for preprocedural respiratory examination: Secondary | ICD-10-CM

## 2021-10-12 ENCOUNTER — Encounter (HOSPITAL_BASED_OUTPATIENT_CLINIC_OR_DEPARTMENT_OTHER): Payer: Self-pay | Admitting: Obstetrics and Gynecology

## 2021-10-12 ENCOUNTER — Other Ambulatory Visit: Payer: Self-pay

## 2021-10-12 NOTE — Progress Notes (Signed)
Spoke w/ via phone for pre-op interview---pt Lab needs dos----  I stat, ekg, urine preg             COVID test -----patient states asymptomatic no test needed Arrive at -------1200 pm 10-18-2021 NPO after MN NO Solid Food.  Clear liquids from MN until---1100 am Med rec completed Medications to take morning of surgery -----albuterol neb, albuterol inhaler prn/bring inhaler, singulair, omeprazole, advair Diabetic medication -----take 1/2 dose pm semglee insulin night before surgery 10-17-2021 ( take 20 units) Patient instructed no nail polish to be worn day of surgery Patient instructed to bring photo id and insurance card day of surgery Patient aware to have Driver (ride ) / caregiver   daughter rosalind   for 24 hours after surgery  Patient Special Instructions -----none Pre-Op special Istructions -----none Patient verbalized understanding of instructions that were given at this phone interview. Patient denies shortness of breath, chest pain, fever, cough at this phone interview.

## 2021-10-18 ENCOUNTER — Telehealth: Payer: Self-pay

## 2021-10-18 ENCOUNTER — Other Ambulatory Visit: Payer: Self-pay

## 2021-10-18 ENCOUNTER — Ambulatory Visit (HOSPITAL_BASED_OUTPATIENT_CLINIC_OR_DEPARTMENT_OTHER)
Admission: RE | Admit: 2021-10-18 | Discharge: 2021-10-18 | Disposition: A | Discharge status: Home / Self Care | Payer: 59 | Source: Ambulatory Visit | Attending: Obstetrics and Gynecology | Admitting: Obstetrics and Gynecology

## 2021-10-18 ENCOUNTER — Ambulatory Visit (HOSPITAL_BASED_OUTPATIENT_CLINIC_OR_DEPARTMENT_OTHER): Payer: 59 | Admitting: Anesthesiology

## 2021-10-18 ENCOUNTER — Encounter (HOSPITAL_BASED_OUTPATIENT_CLINIC_OR_DEPARTMENT_OTHER)
Admission: RE | Disposition: A | Discharge status: Home / Self Care | Payer: Self-pay | Source: Ambulatory Visit | Attending: Obstetrics and Gynecology

## 2021-10-18 ENCOUNTER — Encounter (HOSPITAL_BASED_OUTPATIENT_CLINIC_OR_DEPARTMENT_OTHER): Payer: Self-pay | Admitting: Obstetrics and Gynecology

## 2021-10-18 DIAGNOSIS — Z6841 Body Mass Index (BMI) 40.0 and over, adult: Secondary | ICD-10-CM | POA: Diagnosis not present

## 2021-10-18 DIAGNOSIS — Z9889 Other specified postprocedural states: Secondary | ICD-10-CM

## 2021-10-18 DIAGNOSIS — M2550 Pain in unspecified joint: Secondary | ICD-10-CM

## 2021-10-18 DIAGNOSIS — E119 Type 2 diabetes mellitus without complications: Secondary | ICD-10-CM | POA: Diagnosis not present

## 2021-10-18 DIAGNOSIS — E282 Polycystic ovarian syndrome: Secondary | ICD-10-CM | POA: Diagnosis not present

## 2021-10-18 DIAGNOSIS — I10 Essential (primary) hypertension: Secondary | ICD-10-CM | POA: Diagnosis not present

## 2021-10-18 DIAGNOSIS — M1990 Unspecified osteoarthritis, unspecified site: Secondary | ICD-10-CM | POA: Insufficient documentation

## 2021-10-18 DIAGNOSIS — Z7984 Long term (current) use of oral hypoglycemic drugs: Secondary | ICD-10-CM | POA: Insufficient documentation

## 2021-10-18 DIAGNOSIS — N939 Abnormal uterine and vaginal bleeding, unspecified: Secondary | ICD-10-CM

## 2021-10-18 DIAGNOSIS — Z01818 Encounter for other preprocedural examination: Secondary | ICD-10-CM

## 2021-10-18 DIAGNOSIS — Z794 Long term (current) use of insulin: Secondary | ICD-10-CM

## 2021-10-18 DIAGNOSIS — F419 Anxiety disorder, unspecified: Secondary | ICD-10-CM | POA: Insufficient documentation

## 2021-10-18 DIAGNOSIS — Z793 Long term (current) use of hormonal contraceptives: Secondary | ICD-10-CM | POA: Diagnosis not present

## 2021-10-18 DIAGNOSIS — J45909 Unspecified asthma, uncomplicated: Secondary | ICD-10-CM | POA: Insufficient documentation

## 2021-10-18 DIAGNOSIS — K219 Gastro-esophageal reflux disease without esophagitis: Secondary | ICD-10-CM | POA: Diagnosis not present

## 2021-10-18 HISTORY — DX: Presence of spectacles and contact lenses: Z97.3

## 2021-10-18 HISTORY — PX: DILITATION & CURRETTAGE/HYSTROSCOPY WITH NOVASURE ABLATION: SHX5568

## 2021-10-18 LAB — POCT I-STAT, CHEM 8
BUN: 18 mg/dL (ref 6–20)
Calcium, Ion: 1.21 mmol/L (ref 1.15–1.40)
Chloride: 104 mmol/L (ref 98–111)
Creatinine, Ser: 0.6 mg/dL (ref 0.44–1.00)
Glucose, Bld: 175 mg/dL — ABNORMAL HIGH (ref 70–99)
HCT: 41 % (ref 36.0–46.0)
Hemoglobin: 13.9 g/dL (ref 12.0–15.0)
Potassium: 3.4 mmol/L — ABNORMAL LOW (ref 3.5–5.1)
Sodium: 140 mmol/L (ref 135–145)
TCO2: 22 mmol/L (ref 22–32)

## 2021-10-18 LAB — POCT PREGNANCY, URINE: Preg Test, Ur: NEGATIVE

## 2021-10-18 LAB — GLUCOSE, CAPILLARY: Glucose-Capillary: 197 mg/dL — ABNORMAL HIGH (ref 70–99)

## 2021-10-18 SURGERY — DILATATION & CURETTAGE/HYSTEROSCOPY WITH NOVASURE ABLATION
Anesthesia: General | Site: Vagina

## 2021-10-18 MED ORDER — OXYCODONE HCL 5 MG/5ML PO SOLN: 5.0000 mg | Freq: Once | ORAL | Status: DC | PRN

## 2021-10-18 MED ORDER — IBUPROFEN 600 MG PO TABS: 600.0000 mg | ORAL_TABLET | Freq: Four times a day (QID) | ORAL | 0 refills | Status: DC | PRN

## 2021-10-18 MED ORDER — KETOROLAC TROMETHAMINE 30 MG/ML IJ SOLN
INTRAMUSCULAR | Status: AC
  Filled 2021-10-18: qty 1

## 2021-10-18 MED ORDER — DEXAMETHASONE SODIUM PHOSPHATE 10 MG/ML IJ SOLN
INTRAMUSCULAR | Status: AC
  Filled 2021-10-18: qty 1

## 2021-10-18 MED ORDER — ACETAMINOPHEN 500 MG PO TABS
1000.0000 mg | ORAL_TABLET | Freq: Once | ORAL | Status: AC
  Administered 2021-10-18: 1000 mg via ORAL

## 2021-10-18 MED ORDER — SODIUM CHLORIDE 0.9 % IV SOLN: INTRAVENOUS | Status: DC

## 2021-10-18 MED ORDER — SCOPOLAMINE 1 MG/3DAYS TD PT72
1.0000 | MEDICATED_PATCH | TRANSDERMAL | Status: DC
  Administered 2021-10-18: 1.5 mg via TRANSDERMAL

## 2021-10-18 MED ORDER — FENTANYL CITRATE (PF) 100 MCG/2ML IJ SOLN
INTRAMUSCULAR | Status: DC | PRN
  Administered 2021-10-18 (×2): 50 ug via INTRAVENOUS

## 2021-10-18 MED ORDER — LIDOCAINE HCL (PF) 2 % IJ SOLN
INTRAMUSCULAR | Status: AC
  Filled 2021-10-18: qty 5

## 2021-10-18 MED ORDER — FENTANYL CITRATE (PF) 100 MCG/2ML IJ SOLN
INTRAMUSCULAR | Status: AC
  Filled 2021-10-18: qty 2

## 2021-10-18 MED ORDER — DEXTROSE 5 % IV SOLN
200.0000 mg | Freq: Once | INTRAVENOUS | Status: AC
Start: 2021-10-18 — End: 2021-10-18
  Administered 2021-10-18: 200 mg via INTRAVENOUS
  Filled 2021-10-18: qty 200

## 2021-10-18 MED ORDER — MIDAZOLAM HCL 2 MG/2ML IJ SOLN
INTRAMUSCULAR | Status: AC
  Filled 2021-10-18: qty 2

## 2021-10-18 MED ORDER — ONDANSETRON HCL 4 MG/2ML IJ SOLN
INTRAMUSCULAR | Status: AC
  Filled 2021-10-18: qty 2

## 2021-10-18 MED ORDER — OXYCODONE-ACETAMINOPHEN 5-325 MG PO TABS: 1.0000 | ORAL_TABLET | Freq: Four times a day (QID) | ORAL | 0 refills | Status: DC | PRN

## 2021-10-18 MED ORDER — LACTATED RINGERS IV SOLN
INTRAVENOUS | Status: DC
  Administered 2021-10-18: 1000 mL via INTRAVENOUS

## 2021-10-18 MED ORDER — LIDOCAINE 2% (20 MG/ML) 5 ML SYRINGE
INTRAMUSCULAR | Status: DC | PRN
  Administered 2021-10-18: 100 mg via INTRAVENOUS

## 2021-10-18 MED ORDER — ONDANSETRON HCL 4 MG/2ML IJ SOLN: 4.0000 mg | Freq: Once | INTRAMUSCULAR | Status: DC | PRN

## 2021-10-18 MED ORDER — OXYCODONE HCL 5 MG PO TABS: 5.0000 mg | ORAL_TABLET | Freq: Once | ORAL | Status: DC | PRN

## 2021-10-18 MED ORDER — MIDAZOLAM HCL 5 MG/5ML IJ SOLN
INTRAMUSCULAR | Status: DC | PRN
  Administered 2021-10-18: 2 mg via INTRAVENOUS

## 2021-10-18 MED ORDER — SODIUM CHLORIDE 0.9 % IR SOLN
Status: DC | PRN
  Administered 2021-10-18: 3000 mL

## 2021-10-18 MED ORDER — KETOROLAC TROMETHAMINE 30 MG/ML IJ SOLN
INTRAMUSCULAR | Status: DC | PRN
  Administered 2021-10-18: 30 mg via INTRAVENOUS

## 2021-10-18 MED ORDER — LIDOCAINE HCL 1 % IJ SOLN
INTRAMUSCULAR | Status: DC | PRN
  Administered 2021-10-18: 20 mL

## 2021-10-18 MED ORDER — SILVER NITRATE-POT NITRATE 75-25 % EX MISC
CUTANEOUS | Status: AC
  Filled 2021-10-18: qty 10

## 2021-10-18 MED ORDER — FENTANYL CITRATE (PF) 100 MCG/2ML IJ SOLN
25.0000 ug | INTRAMUSCULAR | Status: DC | PRN
  Administered 2021-10-18: 25 ug via INTRAVENOUS

## 2021-10-18 MED ORDER — SCOPOLAMINE 1 MG/3DAYS TD PT72
MEDICATED_PATCH | TRANSDERMAL | Status: AC
  Filled 2021-10-18: qty 1

## 2021-10-18 MED ORDER — DEXAMETHASONE SODIUM PHOSPHATE 10 MG/ML IJ SOLN
INTRAMUSCULAR | Status: DC | PRN
  Administered 2021-10-18: 5 mg via INTRAVENOUS

## 2021-10-18 MED ORDER — AMISULPRIDE (ANTIEMETIC) 5 MG/2ML IV SOLN: 10.0000 mg | Freq: Once | INTRAVENOUS | Status: DC | PRN

## 2021-10-18 MED ORDER — PROPOFOL 10 MG/ML IV BOLUS
INTRAVENOUS | Status: DC | PRN
  Administered 2021-10-18: 200 mg via INTRAVENOUS

## 2021-10-18 MED ORDER — ACETAMINOPHEN 500 MG PO TABS
ORAL_TABLET | ORAL | Status: AC
  Filled 2021-10-18: qty 2

## 2021-10-18 MED ORDER — DOXYCYCLINE HYCLATE 100 MG PO TABS
ORAL_TABLET | ORAL | Status: AC
  Filled 2021-10-18: qty 2

## 2021-10-18 MED ORDER — ONDANSETRON HCL 4 MG/2ML IJ SOLN
INTRAMUSCULAR | Status: DC | PRN
  Administered 2021-10-18: 4 mg via INTRAVENOUS

## 2021-10-18 SURGICAL SUPPLY — 22 items
ABLATOR SURESOUND NOVASURE (ABLATOR) IMPLANT
CATH ROBINSON RED A/P 16FR (CATHETERS) ×2 IMPLANT
DEVICE MYOSURE LITE (MISCELLANEOUS) IMPLANT
DEVICE MYOSURE REACH (MISCELLANEOUS) IMPLANT
DILATOR CANAL MILEX (MISCELLANEOUS) IMPLANT
DRSG TELFA 3X8 NADH (GAUZE/BANDAGES/DRESSINGS) ×2 IMPLANT
GAUZE 4X4 16PLY ~~LOC~~+RFID DBL (SPONGE) ×4 IMPLANT
GLOVE BIOGEL PI IND STRL 7.5 (GLOVE) ×1 IMPLANT
GLOVE BIOGEL PI INDICATOR 7.5 (GLOVE) ×1
GLOVE SURG SS PI 7.0 STRL IVOR (GLOVE) ×2 IMPLANT
GOWN STRL REUS W/TWL XL LVL3 (GOWN DISPOSABLE) ×2 IMPLANT
KIT PROCEDURE FLUENT (KITS) ×2 IMPLANT
MYOSURE XL FIBROID (MISCELLANEOUS)
PACK VAGINAL MINOR WOMEN LF (CUSTOM PROCEDURE TRAY) ×2 IMPLANT
PAD DRESSING TELFA 3X8 NADH (GAUZE/BANDAGES/DRESSINGS) ×1 IMPLANT
PAD OB MATERNITY 4.3X12.25 (PERSONAL CARE ITEMS) ×2 IMPLANT
PAD PREP 24X48 CUFFED NSTRL (MISCELLANEOUS) ×2 IMPLANT
SEAL ROD LENS SCOPE MYOSURE (ABLATOR) ×2 IMPLANT
SUT VIC AB 2-0 SH 27 (SUTURE)
SUT VIC AB 2-0 SH 27XBRD (SUTURE) IMPLANT
SYSTEM TISS REMOVAL MYOSURE XL (MISCELLANEOUS) IMPLANT
TOWEL OR 17X26 10 PK STRL BLUE (TOWEL DISPOSABLE) ×2 IMPLANT

## 2021-10-18 NOTE — Transfer of Care (Signed)
Immediate Anesthesia Transfer of Care Note  Patient: Lori Shea  Procedure(s) Performed: DILATATION & CURETTAGE/HYSTEROSCOPY WITH NOVASURE ABLATION (Vagina )  Patient Location: PACU  Anesthesia Type:General  Level of Consciousness: awake, alert , oriented and patient cooperative  Airway & Oxygen Therapy: Patient Spontanous Breathing  Post-op Assessment: Report given to RN and Post -op Vital signs reviewed and stable  Post vital signs: Reviewed and stable  Last Vitals:  Vitals Value Taken Time  BP 153/85 10/18/21 1500  Temp    Pulse 80 10/18/21 1505  Resp 11 10/18/21 1505  SpO2 93 % 10/18/21 1505  Vitals shown include unvalidated device data.  Last Pain:  Vitals:   10/18/21 1237  TempSrc:   PainSc: 0-No pain      Patients Stated Pain Goal: 8 (77/03/40 3524)  Complications: No notable events documented.

## 2021-10-18 NOTE — Anesthesia Preprocedure Evaluation (Addendum)
Anesthesia Evaluation  Patient identified by MRN, date of birth, ID band Patient awake    Reviewed: Allergy & Precautions, NPO status , Patient's Chart, lab work & pertinent test results  Airway Mallampati: I  TM Distance: >3 FB Neck ROM: Full    Dental no notable dental hx.    Pulmonary asthma ,    Pulmonary exam normal breath sounds clear to auscultation       Cardiovascular hypertension, Pt. on medications Normal cardiovascular exam Rhythm:Regular Rate:Normal  Normal sinus rhythm Low voltage QRS Cannot rule out Anterior infarct , age undetermined Abnormal ECG When compared with ECG of 02-Nov-2019 15:00, PREVIOUS ECG IS PRESENT   Neuro/Psych  Headaches, PSYCHIATRIC DISORDERS Anxiety    GI/Hepatic Neg liver ROS, GERD  Medicated,  Endo/Other  diabetes, Type 2, Oral Hypoglycemic Agents, Insulin DependentMorbid obesity  Renal/GU negative Renal ROS  negative genitourinary   Musculoskeletal  (+) Arthritis ,   Abdominal   Peds negative pediatric ROS (+)  Hematology negative hematology ROS (+)   Anesthesia Other Findings   Reproductive/Obstetrics negative OB ROS                            Anesthesia Physical Anesthesia Plan  ASA: 3  Anesthesia Plan: General   Post-op Pain Management: Tylenol PO (pre-op)* and Minimal or no pain anticipated   Induction: Intravenous  PONV Risk Score and Plan: 3 and Treatment may vary due to age or medical condition, Scopolamine patch - Pre-op, Ondansetron and Dexamethasone  Airway Management Planned: LMA  Additional Equipment: None  Intra-op Plan:   Post-operative Plan: Extubation in OR  Informed Consent: I have reviewed the patients History and Physical, chart, labs and discussed the procedure including the risks, benefits and alternatives for the proposed anesthesia with the patient or authorized representative who has indicated his/her  understanding and acceptance.     Dental advisory given  Plan Discussed with: CRNA, Anesthesiologist and Surgeon  Anesthesia Plan Comments:        Anesthesia Quick Evaluation

## 2021-10-18 NOTE — Op Note (Signed)
Operative Note   10/18/2021  PRE-OP DIAGNOSIS: Abnormal uterine bleeding   POST-OP DIAGNOSIS: Same  SURGEON: Surgeon(s) and Role:    Aletha Halim, MD - Primary  ASSISTANT: None  PROCEDURE:  Hysteroscopy, dilation and curettage, novasure endometrial ablation  ANESTHESIA: General and paracervical block  ESTIMATED BLOOD LOSS: <28m  DRAINS: I/O cath for 152mUOP  TOTAL IV FLUIDS: per anesthesia note  SPECIMENS: endometrial curettings  VTE PROPHYLAXIS: SCDs to the bilateral lower extremities  ANTIBIOTICS: Doxycycline '100mg'$  IV x 1 pre op  COMPLICATIONS: none  DISPOSITION: PACU - hemodynamically stable.  CONDITION: stable  FINDINGS: Exam under anesthesia revealed small, mobile week sized uterus with no masses and bilateral adnexa without masses or fullness. Patient sounded to 9cm. Uterine cavity length 5cm and cavity width 3.5cm. Hysteroscopy deficit 8026mNovasure ablation 97 W and 42 seconds  Pre procedure, normal EGBUS and cervix with normal endocervical canal and uterine cavity that was atrophic appearing and normal bilateral tubal ostia. Scant endometrial curettings with D&C.  Uniform and complete ablation seen post procedure on hysteroscopy except for a few pink areas near the bilateral tubal ostia.   PROCEDURE IN DETAIL:  After informed consent was obtained, the patient was taken to the operating room where anesthesia was obtained without difficulty. The patient was positioned in the dorsal lithotomy position in [place] was examined under anesthesia, with the above noted findings.  The bi-valved speculum was placed inside the patient's vagina, and the the anterior lip of the cervix was seen and grasped with the tenaculum.  A paracervical block was achieved with 60m85m 1% lidocaine. The was sounded and the Sure Sound device used. She was then dilated to 19 french pratt dilator and a hysteroscopy was done. The hysteroscope was removed and curettage done.  The Novasure device used per manufacturer's instructions. Repeat hysteroscopy was then done with the above findings.  Excellent hemostasis was noted, and all instruments were removed, with excellent hemostasis noted throughout.  She was then taken out of dorsal lithotomy. The patient tolerated the procedure well.  Sponge, lap and instrument counts were correct x2.  The patient was taken to recovery room in excellent condition.  CharDurene RomansAttending Center for [facility]

## 2021-10-18 NOTE — Telephone Encounter (Signed)
Lefyt message for pt to call the office back regarding message from Dr. Ilda Basset.

## 2021-10-18 NOTE — Discharge Instructions (Addendum)
  Post Anesthesia Home Care Instructions  Activity: Get plenty of rest for the remainder of the day. A responsible individual must stay with you for 24 hours following the procedure.  For the next 24 hours, DO NOT: -Drive a car -Paediatric nurse -Drink alcoholic beverages -Take any medication unless instructed by your physician -Make any legal decisions or sign important papers.  Meals: Start with liquid foods such as gelatin or soup. Progress to regular foods as tolerated. Avoid greasy, spicy, heavy foods. If nausea and/or vomiting occur, drink only clear liquids until the nausea and/or vomiting subsides. Call your physician if vomiting continues.  Special Instructions/Symptoms: Your throat may feel dry or sore from the anesthesia or the breathing tube placed in your throat during surgery. If this causes discomfort, gargle with warm salt water. The discomfort should disappear within 24 hours.  If you had a scopolamine patch placed behind your ear for the management of post- operative nausea and/or vomiting:  1. The medication in the patch is effective for 72 hours, after which it should be removed.  Wrap patch in a tissue and discard in the trash. Wash hands thoroughly with soap and water. 2. You may remove the patch earlier than 72 hours if you experience unpleasant side effects which may include dry mouth, dizziness or visual disturbances. 3. Avoid touching the patch. Wash your hands with soap and water after contact with the patch.     Scopolamine patch can be removed on or before Saturday 10/21/21  No acetaminophen/Tylenol until after 6:45 pm today if needed.   No ibuprofen, Advil, Aleve, Motrin, ketorolac, meloxicam, naproxen, or other NSAIDS until after 9:00 pm today if needed.    D & C Home care Instructions:   Personal hygiene:  Used sanitary napkins for vaginal drainage not tampons. Shower or tub bathe the day after your procedure. No douching until bleeding stops. Always  wipe from front to back after  Elimination.  Activity: Do not drive or operate any equipment today. The effects of the anesthesia are still present and drowsiness may result. Rest today, not necessarily flat bed rest, just take it easy. You may resume your normal activity in one to 2 days.  Sexual activity: No intercourse for one week or as indicated by your physician  Diet: Eat a light diet as desired this evening. You may resume a regular diet tomorrow.  Return to work: One to 2 days.  General Expectations of your surgery: Vaginal bleeding should be no heavier than a normal period. Spotting may continue up to 10 days. Mild cramps may continue for a couple of days. You may have a regular period in 2-6 weeks.  Unexpected observations call your doctor if these occur: persistent or heavy bleeding. Severe abdominal cramping or pain. Elevation of temperature greater than 100F.  Call for an appointment in one week.

## 2021-10-18 NOTE — H&P (Signed)
Obstetrics & Gynecology Surgical H&P   Date of Surgery: 10/18/2021    Primary OBGYN: [facility]  Reason for Admission: scheduled endometrial ablation  History of Present Illness: Lori Shea is a 46 y.o. [phone] (Patient's last menstrual period was 10/14/2021.), with the above CC. PMHx is significant for chronic AUB, DM2, PCOS, BMI 40s, h/o abnormal paps, 2020 filshie BTL   Patient last seen 5/24 and u/s ordered. She is being followed for AUB after her BTL, with her being on combined OCPs prior to that without issue. She does not have a consistent, regular monthly period even on the cyclic provera and she does not have any menopasual s/s. When she does have a period with the provera, she doesn't have heavy bleeding but does have bad painful cramping. Prior Sept 2022 embx showed proliferative endometrium with extensive tubal metaplasia.     ROS: A 12-point review of systems was performed and negative, except as stated in the above HPI.  OBGYN History: As per HPI. OB History  Gravida Para Term Preterm AB Living  '4 1 1   3 1  '$ SAB IAB Ectopic Multiple Live Births  3            # Outcome Date GA Lbr Len/2nd Weight Sex Delivery Anes PTL Lv  4 SAB           3 Term           2 SAB           1 SAB              Past Medical History: Past Medical History:  Diagnosis Date   Allergy    Foods:  mango, honey, corn.  Allergic to "everything tested"  15 years ago in [place]   Anemia    with pregnancy   Anxiety    Arthritis    Shoulder and hips   Asthma age 74 yo   Triggers:  cold weather, exercise, allergens, anxiety, URIs   Cholelithiasis age 38 yo   Asymptomatic   Closed avulsion fracture of lateral malleolus of right fibula 12/27/2016   COVID-19    Diabetes mellitus type 2 2012   Was diagnosed 2 years earlier with PCOS   Elevated liver enzymes 09/13/2016   none since 2018   GERD (gastroesophageal reflux disease)    Heart murmur    with pregnancy 20  yrs ago per pt on 10-12-2021   History of kidney stones    Hypertension 2015   Kidney stones    family history of renal failure   Migraines 2009   Morbid obesity ([place])    PCOS (polycystic ovarian syndrome) 2010   Pneumonia    several years ago   Wears glasses     Past Surgical History: Past Surgical History:  Procedure Laterality Date   CHOLECYSTECTOMY N/A 12/01/2019   Procedure: LAPAROSCOPIC CHOLECYSTECTOMY;  Surgeon: Armandina Gemma, MD;  Location: [facility];  Service: General;  Laterality: N/A;   colonscopy and endoscopy  05/25/2021   COLPOSCOPY N/A 2021   CYST EXCISION     LAPAROSCOPIC TUBAL LIGATION Bilateral 01/28/2019   Procedure: LAPAROSCOPIC TUBAL LIGATION AND PAP SMEAR;  Surgeon: Aletha Halim, MD;  Location: [place];  Service: Gynecology;  Laterality: Bilateral;   TONSILLECTOMY AND ADENOIDECTOMY Bilateral 12/23/2018   Procedure: TONSILLECTOMY AND ADENOIDECTOMY;  Surgeon: Leta Baptist, MD;  Location: [place];  Service: ENT;  Laterality: Bilateral;    Family History:  Family  History  Problem Relation Age of Onset   Healthy Mother    Diabetes Father    Hyperlipidemia Father    Hypertension Father    Heart disease Father    Kidney disease Father        Developed after 2nd CABG   Colon polyps Father    Diabetes Paternal Grandfather    Heart disease Paternal Grandfather    Hyperlipidemia Paternal Grandfather    Hypertension Paternal Grandfather    Kidney disease Paternal Grandfather    Social History:  Social History   Socioeconomic History   Marital status: Divorced    Spouse name: Not on file   Number of children: 1   Years of education: some comm. college   Highest education level: Not on file  Occupational History   Occupation: CNA    Comment: [employer]  Tobacco Use   Smoking status: Never   Smokeless tobacco: Never  Vaping Use   Vaping Use: Never used  Substance and Sexual Activity   Alcohol use: No    Drug use: No   Sexual activity: Not Currently    Birth control/protection: Surgical    Comment: Sprintec.  Tubal ligation  Other Topics Concern   Not on file  Social History Narrative   Originally from [place], [place] to [place] in 1988.   In [place] in [place] at home with daughter on [address].   Social Determinants of Health   Financial Resource Strain: Not on file  Food Insecurity: No Food Insecurity (10/02/2021)   Hunger Vital Sign    Worried About Running Out of Food in the Last Year: Never true    Ran Out of Food in the Last Year: Never true  Transportation Needs: No Transportation Needs (10/02/2021)   PRAPARE - Hydrologist (Medical): No    Lack of Transportation (Non-Medical): No  Physical Activity: Not on file  Stress: Not on file  Social Connections: Not on file  Intimate Partner Violence: Not on file     Allergy: Allergies  Allergen Reactions   Corn-Containing Products Hives    "really bad asthma attacks"   Honey Shortness Of Breath   Mango Flavor Hives and Swelling    Pt allergic to all mango - tongue and throat swelling   Glipizide     Other reaction(s): Other bad yeast infection   Liraglutide Nausea And Vomiting    victoza    Current Outpatient Medications: Medications Prior to Admission  Medication Sig Dispense Refill Last Dose   albuterol (PROVENTIL) (2.5 MG/3ML) 0.083% nebulizer solution Take 3 mLs (2.5 mg total) by nebulization every 6 (six) hours as needed for wheezing or shortness of breath. 75 mL 6 10/18/2021   albuterol (VENTOLIN HFA) 108 (90 Base) MCG/ACT inhaler INHALE 2 PUFFS INTO THE LUNGS EVERY 4 HOURS AS NEEDED FOR FOR WHEEZING OR SHORTNESS OF BREATH 8.5 g 2 Past Week   amitriptyline (ELAVIL) 25 MG tablet TAKE 1 TABLET BY MOUTH NIGHTLY AT BEDTIME 30 tablet 11 10/17/2021   cetirizine (ZYRTEC) 10 MG tablet Take 1 tablet (10 mg total) by mouth daily. (Patient taking differently: Take 10 mg  by mouth at bedtime.) 30 tablet 11 10/17/2021   dapagliflozin propanediol (FARXIGA) 5 MG TABS tablet Take 2 tablets (10 mg total) by mouth daily. 180 tablet 0 10/17/2021   EASY COMFORT PEN NEEDLES 32G X 4 MM MISC use as directed test 2 times daily 100 each 3  10/17/2021   fluticasone-salmeterol (ADVAIR DISKUS) 100-50 MCG/ACT AEPB Inhale 1 puff into the lungs 2 (two) times daily. 60 each 11 10/18/2021   insulin aspart (NOVOLOG FLEXPEN) 100 UNIT/ML FlexPen Inject 10-14 Units into the skin 3 (three) times daily before meals. 30 mL 11 10/17/2021 at 1730   insulin glargine-yfgn (SEMGLEE) 100 UNIT/ML Pen Inject 40 units below the skin 2 times daily. 60 mL 0 10/17/2021   Insulin Pen Needle (PEN NEEDLES) 32G X 5 MM MISC 100 each by Does not apply route 2 (two) times daily. 100 each 3 10/17/2021   MAGNESIUM CITRATE PO Take 1 capsule by mouth every morning.    10/17/2021   meloxicam (MOBIC) 15 MG tablet Take 1 tablet (15 mg total) by mouth daily. (Patient taking differently: Take 15 mg by mouth as needed.) 30 tablet 11 Past Month   metFORMIN (GLUCOPHAGE) 500 MG tablet Take 1 tablet (500 mg total) by mouth 2 (two) times daily with a meal. 60 tablet 11 10/17/2021   montelukast (SINGULAIR) 10 MG tablet Take 1 tablet (10 mg total) by mouth daily. 30 tablet 11 10/17/2021   olmesartan-hydrochlorothiazide (BENICAR HCT) 40-25 MG tablet Take 1 tablet by mouth daily. 90 tablet 3 10/17/2021   cyclobenzaprine (FLEXERIL) 5 MG tablet Take 1 tablet (5 mg total) by mouth 3 (three) times daily as needed for muscle spasms. 10 tablet 0 More than a month   esomeprazole (NEXIUM) 20 MG capsule Take 20 mg by mouth at bedtime.   More than a month   ibuprofen (ADVIL) 800 MG tablet Take 1 tablet (800 mg total) by mouth 3 (three) times daily. (Patient taking differently: Take 800 mg by mouth every 8 (eight) hours as needed.) 21 tablet 0 More than a month   medroxyPROGESTERone (PROVERA) 10 MG tablet 1 tab po qday starting on the first day of the  month and for the next 14 days. You should get a period after that. Repeat monthly. 42 tablet 3 10/05/2021   omeprazole (PRILOSEC) 40 MG capsule Take 1 capsule (40 mg total) by mouth daily. 30 capsule 6 10/16/2021   promethazine (PHENERGAN) 25 MG tablet Take 25 mg by mouth every 8 (eight) hours as needed.   More than a month   rizatriptan (MAXALT-MLT) 10 MG disintegrating tablet Take 1 tablet (10 mg total) by mouth as needed for migraine. May repeat in 2 hours if needed 12 tablet 0 More than a month   simvastatin (ZOCOR) 10 MG tablet Take 1 tablet (10 mg total) by mouth at bedtime. 90 tablet 3 More than a month     Hospital Medications: Current Facility-Administered Medications  Medication Dose Route Frequency Provider Last Rate Last Admin   0.9 %  sodium chloride infusion   Intravenous Continuous Aletha Halim, MD       doxycycline (VIBRAMYCIN) 200 mg in dextrose 5 % 250 mL IVPB  200 mg Intravenous Once Aletha Halim, MD       lactated ringers infusion   Intravenous Continuous Pervis Hocking, DO 50 mL/hr at 10/18/21 1313 1,000 mL at 10/18/21 1313   scopolamine (TRANSDERM-SCOP) 1 MG/3DAYS 1.5 mg  1 patch Transdermal Q72H Merlinda Frederick, MD   1.5 mg at 10/18/21 1246     Physical Exam:  Current Vital Signs 24h Vital Sign Ranges  T 98.9 F (37.2 C) Temp  Avg: 98.9 F (37.2 C)  Min: 98.9 F (37.2 C)  Max: 98.9 F (37.2 C)  BP (!) 151/100 BP  Min: 151/100  Max:  151/100  HR 92 Pulse  Avg: 92  Min: 92  Max: 92  RR 19 Resp  Avg: 19  Min: 19  Max: 19  SaO2 97 % Room Air SpO2  Avg: 97 %  Min: 97 %  Max: 97 %       24 Hour I/O Current Shift I/O  Time Ins Outs No intake/output data recorded. No intake/output data recorded.    Body mass index is 45.56 kg/m. General appearance: Well nourished, well developed female in no acute distress.  Cardiovascular: S1, S2 normal, no murmur, rub or gallop, regular rate and rhythm Respiratory:  Clear to auscultation bilateral. Normal  respiratory effort Abdomen: positive bowel sounds and no masses, hernias; diffusely non tender to palpation, non distended Neuro/Psych:  Normal mood and affect.  Skin:  Warm and dry.  Extremities: no clubbing, cyanosis, or edema.   Laboratory: UPT: neg Recent Labs  Lab 10/18/21 1307  HGB 13.9  HCT 41.0   Recent Labs  Lab 10/18/21 1307  NA 140  K 3.4*  CL 104  BUN 18  CREATININE 0.60  GLUCOSE 175*     Imaging:  Narrative & Impression  CLINICAL DATA:  Abnormal uterine bleeding. Prior tubal ligation. PCOS. Cervical dysplasia. LMP 09/11/2021   EXAM: TRANSABDOMINAL AND TRANSVAGINAL ULTRASOUND OF PELVIS   TECHNIQUE: Both transabdominal and transvaginal ultrasound examinations of the pelvis were performed. Transabdominal technique was performed for global imaging of the pelvis including uterus, ovaries, adnexal regions, and pelvic cul-de-sac. It was necessary to proceed with endovaginal exam following the transabdominal exam to visualize the endometrium, uterus, ovaries, adnexal regions.   COMPARISON:  12/18/2010   FINDINGS: Uterus   Measurements: 7.9 x 4.6 x 6.2 centimeters = volume: 117.2 mL. Uterus is retroflexed and normal in appearance. No fibroids or other mass.   Endometrium   Thickness: 2.6 millimeters.  No focal abnormality visualized.   Right ovary   Measurements: 4.3 x 2.5 x 4.9 centimeters = volume: 27.4 mL. No focal abnormality visualized.   Left ovary   Measurements: 3.9 x 3.1 x 4.6 centimeters = volume: 28.9 mL. Normal appearance/no adnexal mass.   Other findings   No abnormal free fluid. Study quality is degraded by patient body habitus.   IMPRESSION: Normal pelvic ultrasound.   If bleeding remains unresponsive to hormonal or medical therapy, sonohysterogram should be considered for focal lesion work-up. (Ref: Radiological Reasoning: Algorithmic Workup of Abnormal Vaginal Bleeding with Endovaginal Sonography and Sonohysterography.  AJR 2008; [id number]:T02-40)     Electronically Signed   By: Nolon Nations M.D.   On: 09/25/2021 10:52    Assessment: Ms. Raphael is a 46 y.o. [phone] (Patient's last menstrual period was 10/14/2021.) here for scheduled endometrial ablation. Pt doing well  Plan: Reviewed with patient re: the procedure and she is amenable to procedure. Can proceed when OR is ready   Durene Romans MD Attending [facility])

## 2021-10-18 NOTE — Anesthesia Procedure Notes (Signed)
Procedure Name: LMA Insertion Date/Time: 10/18/2021 2:23 PM  Performed by: Rogers Blocker, CRNAPre-anesthesia Checklist: Patient identified, Emergency Drugs available, Suction available and Patient being monitored Patient Re-evaluated:Patient Re-evaluated prior to induction Oxygen Delivery Method: Circle System Utilized Preoxygenation: Pre-oxygenation with 100% oxygen Induction Type: IV induction Ventilation: Mask ventilation without difficulty LMA: LMA inserted LMA Size: 4.0 Number of attempts: 1 Placement Confirmation: positive ETCO2 Tube secured with: Tape Dental Injury: Teeth and Oropharynx as per pre-operative assessment

## 2021-10-18 NOTE — Anesthesia Postprocedure Evaluation (Signed)
Anesthesia Post Note  Patient: Lori Shea  Procedure(s) Performed: DILATATION & CURETTAGE/HYSTEROSCOPY WITH NOVASURE ABLATION (Vagina )     Patient location during evaluation: PACU Anesthesia Type: General Level of consciousness: awake Pain management: pain level controlled Vital Signs Assessment: post-procedure vital signs reviewed and stable Respiratory status: spontaneous breathing and respiratory function stable Cardiovascular status: stable Postop Assessment: no apparent nausea or vomiting Anesthetic complications: no   No notable events documented.  Last Vitals:  Vitals:   10/18/21 1545 10/18/21 1620  BP: (!) 145/86 (!) 135/92  Pulse: 84 75  Resp: 17 18  Temp:  36.5 C  SpO2: 94% 96%    Last Pain:  Vitals:   10/18/21 1620  TempSrc:   PainSc: 0-No pain                 Merlinda Frederick

## 2021-10-20 ENCOUNTER — Encounter (HOSPITAL_BASED_OUTPATIENT_CLINIC_OR_DEPARTMENT_OTHER): Payer: Self-pay | Admitting: Obstetrics and Gynecology

## 2021-10-20 LAB — SURGICAL PATHOLOGY

## 2021-10-23 ENCOUNTER — Other Ambulatory Visit: Payer: Self-pay | Admitting: Gastroenterology

## 2021-10-23 ENCOUNTER — Other Ambulatory Visit: Payer: Self-pay | Admitting: Nurse Practitioner

## 2021-10-31 ENCOUNTER — Encounter: Payer: Self-pay | Admitting: Obstetrics and Gynecology

## 2021-11-01 ENCOUNTER — Ambulatory Visit (HOSPITAL_COMMUNITY)
Admission: EM | Admit: 2021-11-01 | Discharge: 2021-11-01 | Disposition: A | Discharge status: Home / Self Care | Payer: 59 | Attending: Internal Medicine | Admitting: Internal Medicine

## 2021-11-01 ENCOUNTER — Encounter (HOSPITAL_COMMUNITY): Payer: Self-pay

## 2021-11-01 ENCOUNTER — Ambulatory Visit: Payer: 59

## 2021-11-01 DIAGNOSIS — R102 Pelvic and perineal pain: Secondary | ICD-10-CM | POA: Diagnosis not present

## 2021-11-01 LAB — POCT URINALYSIS DIPSTICK, ED / UC
Bilirubin Urine: NEGATIVE
Glucose, UA: >=1000 mg/dL — AB
Hgb urine dipstick: NEGATIVE
Ketones, ur: NEGATIVE mg/dL
Leukocytes,Ua: NEGATIVE
Nitrite: NEGATIVE
Protein, ur: NEGATIVE mg/dL
Specific Gravity, Urine: 1.01 (ref 1.005–1.030)
Urobilinogen, UA: 0.2 mg/dL (ref 0.0–1.0)
pH: 5 (ref 5.0–8.0)

## 2021-11-01 MED ORDER — CEPHALEXIN 500 MG PO CAPS: 500.0000 mg | ORAL_CAPSULE | Freq: Four times a day (QID) | ORAL | 0 refills | Status: AC

## 2021-11-01 NOTE — Discharge Instructions (Addendum)
Take Keflex four times daily for the next seven days.  Please keep your follow-up appointment with gynecology. Your urine culture is in process.

## 2021-11-01 NOTE — ED Triage Notes (Signed)
Patient had an uterine ablation in June 28th.  Patient having low abdominal and loa back pain with urinary frequency.  No burning with urination, slight odor to the urine.

## 2021-11-01 NOTE — ED Provider Notes (Signed)
Patient Contact: 5:10 PM (approximate)   History   Urinary Tract Infection   HPI  Lori Shea is a 46 y.o. female presents to the urgent care with some suprapubic discomfort and increased urinary frequency.  Patient states that she has had a uterine ablation on June 28 and states that she has had some increased urinary frequency since procedure.  Patient states that when she sits upright, she experiences some sharp suprapubic discomfort.  She does have a follow-up appointment with her gynecologist next week.  She denies fever, chills or flank pain.  She does report a history of nephrolithiasis but states that her current symptoms feel significantly different.      Physical Exam   Triage Vital Signs: ED Triage Vitals  Enc Vitals Group     BP 11/01/21 1618 125/86     Pulse Rate 11/01/21 1618 74     Resp 11/01/21 1618 16     Temp 11/01/21 1618 98.7 F (37.1 C)     Temp Source 11/01/21 1618 Oral     SpO2 11/01/21 1618 97 %     Weight 11/01/21 1620 272 lb (123.4 kg)     Height 11/01/21 1620 '5\' 5"'$  (1.651 m)     Head Circumference --      Peak Flow --      Pain Score 11/01/21 1619 7     Pain Loc --      Pain Edu? --      Excl. in [place]? --     Most recent vital signs: Vitals:   11/01/21 1618  BP: 125/86  Pulse: 74  Resp: 16  Temp: 98.7 F (37.1 C)  SpO2: 97%     General: Alert and in no acute distress. Eyes:  PERRL. EOMI. Head: No acute traumatic findings ENT:      Nose: No congestion/rhinnorhea.      Mouth/Throat: Mucous membranes are moist. Neck: No stridor. No cervical spine tenderness to palpation. Cardiovascular:  Good peripheral perfusion Respiratory: Normal respiratory effort without tachypnea or retractions. Lungs CTAB. Good air entry to the bases with no decreased or absent breath sounds. Gastrointestinal: Bowel sounds 4 quadrants. Soft and nontender to palpation. No guarding or rigidity. No palpable masses. No distention. No CVA  tenderness. Musculoskeletal: Full range of motion to all extremities.  Neurologic:  No gross focal neurologic deficits are appreciated.  Skin:   No rash noted    ED Results / Procedures / Treatments   Labs (all labs ordered are listed, but only abnormal results are displayed) Labs Reviewed  POCT URINALYSIS DIPSTICK, ED / UC - Abnormal; Notable for the following components:      Result Value   Glucose, UA >=1000 (*)    All other components within normal limits  URINE CULTURE       PROCEDURES:  Critical Care performed: No  Procedures   MEDICATIONS ORDERED IN ED: Medications - No data to display   IMPRESSION / MDM / [place] / ED COURSE  I reviewed the triage vital signs and the nursing notes.                              Assessment and plan Suprapubic pain 46 year old female presents to the urgent care with suprapubic pain and increased urinary frequency that started after a uterine ablation procedure on June 28.  Vital signs were reassuring at triage.  On exam, patient was alert, active and  nontoxic-appearing.  Abdomen was soft and nontender without guarding and patient had no CVA tenderness.  Urinalysis not suggestive of UTI but culture in process.  We treat patient empirically with Keflex until culture returns as gynecologist was concerned for UTI.  Patient has follow-up appointment with her gynecologist next week and I recommended that patient keep appointment.  Return precautions were given to return with new or worsening symptoms.     FINAL CLINICAL IMPRESSION(S) / ED DIAGNOSES   Final diagnoses:  Suprapubic pain     Rx / DC Orders   ED Discharge Orders          Ordered    cephALEXin (KEFLEX) 500 MG capsule  4 times daily        11/01/21 1704             Note:  This document was prepared using Dragon voice recognition software and may include unintentional dictation errors.   Vallarie Mare [place], [place] 11/01/21 1714

## 2021-11-02 ENCOUNTER — Ambulatory Visit: Payer: 59

## 2021-11-02 ENCOUNTER — Telehealth: Payer: Self-pay

## 2021-11-02 LAB — URINE CULTURE

## 2021-11-02 NOTE — Telephone Encounter (Signed)
Lantus Insulin pins

## 2021-11-03 ENCOUNTER — Telehealth: Payer: Self-pay

## 2021-11-07 ENCOUNTER — Telehealth: Payer: Self-pay

## 2021-11-07 MED ORDER — INSULIN GLARGINE-YFGN 100 UNIT/ML ~~LOC~~ SOPN: PEN_INJECTOR | SUBCUTANEOUS | 0 refills | Status: DC

## 2021-11-07 NOTE — Telephone Encounter (Signed)
Done

## 2021-11-07 NOTE — Telephone Encounter (Signed)
No additional notes needed  

## 2021-11-09 ENCOUNTER — Encounter: Payer: Self-pay | Admitting: Obstetrics and Gynecology

## 2021-11-09 ENCOUNTER — Other Ambulatory Visit (HOSPITAL_COMMUNITY)
Admission: RE | Admit: 2021-11-09 | Discharge: 2021-11-09 | Disposition: A | Discharge status: Home / Self Care | Payer: 59 | Source: Ambulatory Visit | Attending: Obstetrics and Gynecology | Admitting: Obstetrics and Gynecology

## 2021-11-09 ENCOUNTER — Ambulatory Visit (INDEPENDENT_AMBULATORY_CARE_PROVIDER_SITE_OTHER): Payer: 59 | Admitting: Obstetrics and Gynecology

## 2021-11-09 VITALS — BP 118/80 | HR 85 | Wt 272.0 lb

## 2021-11-09 DIAGNOSIS — N879 Dysplasia of cervix uteri, unspecified: Secondary | ICD-10-CM

## 2021-11-09 DIAGNOSIS — Z09 Encounter for follow-up examination after completed treatment for conditions other than malignant neoplasm: Secondary | ICD-10-CM

## 2021-11-09 DIAGNOSIS — N898 Other specified noninflammatory disorders of vagina: Secondary | ICD-10-CM | POA: Insufficient documentation

## 2021-11-09 DIAGNOSIS — N393 Stress incontinence (female) (male): Secondary | ICD-10-CM

## 2021-11-09 NOTE — Progress Notes (Signed)
Post-op Hysteroscopy, dilation and curettage, novasure endometrial ablation on 10/18/21  Pt recently stopped antibiotics pt thought she may have had UTI. Pt received call results showed no UTI.  Denies any vaginal bleeding, still having urgency and discomfort pt unsure if this is normal after procedure.

## 2021-11-09 NOTE — Progress Notes (Signed)
Obstetrics and Gynecology Visit Return Patient Evaluation  Appointment Date: 11/09/2021  Primary Care Provider: [place], [place] for [facility]  Chief Complaint: regular post op follow up  History of Present Illness:  Lori Shea is a 46 y.o. s/p 6/28 hysteroscopy, d&c, novasure endometrial ablation for AUB. Hysteroscopy findings negative and showed atrophic uterine cavity; final pathology negative (inactive endometrium). Patient went to UC on 7/12 for increased urinary frequency and was started on abx but stopped when final ucx came back neg.   Pt states urinary frequency continues. She is currently on abx for chest cellulitis.  No period yet since surgery but having some cramping so it feels like it may be starting.   Review of Systems: as noted in the History of Present Illness.   Patient Active Problem List   Diagnosis Date Noted   Stress incontinence of urine 01/02/2021   Skin irritation 01/02/2021   Hepatic steatosis 12/01/2019   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/2019   Hemoglobin A1C greater than 9%, indicating poor diabetic control 03/23/2019   Hyperglycemia 03/23/2019   Class 3 severe obesity due to excess calories with serious comorbidity and body mass index (BMI) of 40.0 to 44.9 in adult ([place]) 03/23/2019   Yeast infection 03/23/2019   BMI 40.0-44.9, adult ([place]) 03/09/2019   Cervical dysplasia 07/31/2017   Alopecia of scalp 05/09/2017   Pain in left toe(s) 12/27/2016   Morbid obesity ([place]) 09/13/2016   Elevated liver enzymes 09/13/2016   Allergy    Asthma    Hypertension 04/23/2013   Irregular periods 02/22/2011   Type 2 diabetes mellitus with hyperglycemia, with long-term current use of insulin ([place]) 04/23/2010   PCOS (polycystic ovarian syndrome) 04/23/2008   Migraines 04/24/2007   Medications:  Nena Alexander. Eugenio Hoes "Juliann Pulse" had no medications administered during this visit. Current Outpatient  Medications  Medication Sig Dispense Refill   albuterol (PROVENTIL) (2.5 MG/3ML) 0.083% nebulizer solution Take 3 mLs (2.5 mg total) by nebulization every 6 (six) hours as needed for wheezing or shortness of breath. 75 mL 6   albuterol (VENTOLIN HFA) 108 (90 Base) MCG/ACT inhaler INHALE 2 PUFFS INTO THE LUNGS EVERY 4 HOURS AS NEEDED FOR FOR WHEEZING OR SHORTNESS OF BREATH 8.5 g 2   amitriptyline (ELAVIL) 25 MG tablet TAKE 1 TABLET BY MOUTH NIGHTLY AT BEDTIME 30 tablet 11   cetirizine (ZYRTEC) 10 MG tablet Take 1 tablet (10 mg total) by mouth daily. (Patient taking differently: Take 10 mg by mouth at bedtime.) 30 tablet 11   cyclobenzaprine (FLEXERIL) 5 MG tablet Take 1 tablet (5 mg total) by mouth 3 (three) times daily as needed for muscle spasms. 10 tablet 0   dapagliflozin propanediol (FARXIGA) 5 MG TABS tablet Take 2 tablets (10 mg total) by mouth daily. 180 tablet 0   EASY COMFORT PEN NEEDLES 32G X 4 MM MISC use as directed test 2 times daily 100 each 3   esomeprazole (NEXIUM) 20 MG capsule Take 20 mg by mouth at bedtime.     fluticasone-salmeterol (ADVAIR DISKUS) 100-50 MCG/ACT AEPB Inhale 1 puff into the lungs 2 (two) times daily. 60 each 11   ibuprofen (ADVIL) 600 MG tablet Take 1 tablet (600 mg total) by mouth every 6 (six) hours as needed. 30 tablet 0   insulin aspart (NOVOLOG FLEXPEN) 100 UNIT/ML FlexPen Inject 10-14 Units into the skin 3 (three) times daily before meals. 30 mL 11   insulin glargine-yfgn (SEMGLEE) 100 UNIT/ML Pen Inject 40 units  below the skin 2 times daily. 60 mL 0   Insulin Pen Needle (PEN NEEDLES) 32G X 5 MM MISC 100 each by Does not apply route 2 (two) times daily. 100 each 3   MAGNESIUM CITRATE PO Take 1 capsule by mouth every morning.      meloxicam (MOBIC) 15 MG tablet Take 1 tablet (15 mg total) by mouth daily. (Patient taking differently: Take 15 mg by mouth as needed.) 30 tablet 11   metFORMIN (GLUCOPHAGE) 500 MG tablet Take 1 tablet (500 mg total) by mouth 2  (two) times daily with a meal. 60 tablet 11   montelukast (SINGULAIR) 10 MG tablet Take 1 tablet (10 mg total) by mouth daily. 30 tablet 11   olmesartan-hydrochlorothiazide (BENICAR HCT) 40-25 MG tablet Take 1 tablet by mouth daily. 90 tablet 3   omeprazole (PRILOSEC) 40 MG capsule Take 1 capsule (40 mg total) by mouth daily. 30 capsule 6   promethazine (PHENERGAN) 25 MG tablet Take 25 mg by mouth every 8 (eight) hours as needed.     rizatriptan (MAXALT-MLT) 10 MG disintegrating tablet Take 1 tablet (10 mg total) by mouth as needed for migraine. May repeat in 2 hours if needed 12 tablet 0   oxyCODONE-acetaminophen (PERCOCET/ROXICET) 5-325 MG tablet Take 1 tablet by mouth every 6 (six) hours as needed. (Patient not taking: Reported on 11/09/2021) 10 tablet 0   simvastatin (ZOCOR) 10 MG tablet Take 1 tablet (10 mg total) by mouth at bedtime. (Patient not taking: Reported on 11/09/2021) 90 tablet 3   No current facility-administered medications for this visit.    Allergies: is allergic to corn-containing products, honey, mango flavor, glipizide, and liraglutide.  Physical Exam:  BP 118/80   Pulse 85   Wt 272 lb (123.4 kg)   LMP 10/14/2021 (Exact Date)   BMI 45.26 kg/m  Body mass index is 45.26 kg/m. General appearance: Well nourished, well developed female in no acute distress.  Abdomen: diffusely non tender to palpation, non distended, and no masses, hernias Neuro/Psych:  Normal mood and affect.    Pelvic exam:  EGBUS: mild labia erythema and white d/c Vaginal vault: white-yellowish tan d/c, no vb, no cottage cheese like d/c Cervix:  normal, nttp Bimanual: negative   Assessment: ?yeast infection. Normal post op visit  Plan:  1. Cervical dysplasia 1 year surveillance pap today - Cytology - PAP( [place])  2. Vaginal discharge F/u swab. May need multiple diflucan doses due to be on 14d course of abx  3. Postop check Healing normally  4. Stress incontinence of  urine Pre-exisiting surgery, chronic problem. Pelvic floor PT referral offered. Pt to consider   RTC: PRN  Durene Romans MD Attending Center for [employer] [facility])

## 2021-11-10 LAB — CERVICOVAGINAL ANCILLARY ONLY
Bacterial Vaginitis (gardnerella): NEGATIVE
Candida Glabrata: POSITIVE — AB
Candida Vaginitis: NEGATIVE
Comment: NEGATIVE
Comment: NEGATIVE
Comment: NEGATIVE

## 2021-11-13 LAB — CYTOLOGY - PAP
Comment: NEGATIVE
Diagnosis: NEGATIVE
High risk HPV: NEGATIVE

## 2021-11-14 MED ORDER — FLUCONAZOLE 150 MG PO TABS: 150.0000 mg | ORAL_TABLET | ORAL | 0 refills | Status: DC

## 2021-11-14 NOTE — Telephone Encounter (Signed)
No additional notes needed  

## 2021-11-14 NOTE — Addendum Note (Signed)
Addended by: Aletha Halim on: 11/14/2021 12:04 PM   Modules accepted: Orders

## 2021-11-22 ENCOUNTER — Encounter: Payer: Self-pay | Admitting: Obstetrics and Gynecology

## 2021-12-27 ENCOUNTER — Telehealth: Payer: Self-pay | Admitting: Nurse Practitioner

## 2021-12-27 ENCOUNTER — Other Ambulatory Visit: Payer: Self-pay

## 2021-12-27 NOTE — Telephone Encounter (Signed)
Pt's insurance is rejecting the Insulin Aspart FlexPen 100UNIT/ML Pen-injectors.  The insurance's preferred is Humalog. If the patient is eligible to be put on Humalog, please resend the new prescription to the pharmacy. If the pt has a medical reason why they cannot be put on the preferred Humalog, please provide documentation of this reason so that the PA is able to approved.

## 2021-12-29 ENCOUNTER — Other Ambulatory Visit: Payer: Self-pay | Admitting: Nurse Practitioner

## 2021-12-29 ENCOUNTER — Telehealth: Payer: Self-pay | Admitting: Nurse Practitioner

## 2021-12-29 MED ORDER — HUMALOG KWIKPEN 200 UNIT/ML ~~LOC~~ SOPN: 10.0000 [IU] | PEN_INJECTOR | Freq: Three times a day (TID) | SUBCUTANEOUS | 1 refills | Status: DC

## 2021-12-29 MED ORDER — HUMALOG KWIKPEN 200 UNIT/ML ~~LOC~~ SOPN: 10.0000 [IU] | PEN_INJECTOR | Freq: Three times a day (TID) | SUBCUTANEOUS | 0 refills | Status: DC

## 2021-12-29 NOTE — Telephone Encounter (Signed)
Rx changed and sent to pharmacy.  °

## 2021-12-29 NOTE — Telephone Encounter (Signed)
Prescription for Humalog was sent to pharmacy with a dose of 3 ml. Humalog comes in a box with 2 pens for 6 ml total. The box cannot be opened and seperated. Please send in script for 6 ml.  You can also call and verbally authorize this at [phone]

## 2022-01-08 ENCOUNTER — Other Ambulatory Visit: Payer: Self-pay | Admitting: Nurse Practitioner

## 2022-01-08 DIAGNOSIS — G43001 Migraine without aura, not intractable, with status migrainosus: Secondary | ICD-10-CM

## 2022-01-15 ENCOUNTER — Telehealth: Payer: Self-pay

## 2022-01-16 ENCOUNTER — Telehealth: Payer: Self-pay

## 2022-01-16 NOTE — Telephone Encounter (Signed)
No additional notes needed  

## 2022-01-16 NOTE — Telephone Encounter (Signed)
No additional needed notes

## 2022-01-24 ENCOUNTER — Ambulatory Visit: Payer: Commercial Managed Care - HMO | Admitting: Gastroenterology

## 2022-01-24 ENCOUNTER — Encounter: Payer: Self-pay | Admitting: Gastroenterology

## 2022-01-24 VITALS — BP 122/92 | HR 96 | Ht 65.0 in | Wt 268.0 lb

## 2022-01-24 DIAGNOSIS — K219 Gastro-esophageal reflux disease without esophagitis: Secondary | ICD-10-CM

## 2022-01-24 DIAGNOSIS — K529 Noninfective gastroenteritis and colitis, unspecified: Secondary | ICD-10-CM | POA: Diagnosis not present

## 2022-01-24 DIAGNOSIS — Z86010 Personal history of colonic polyps: Secondary | ICD-10-CM

## 2022-01-24 DIAGNOSIS — K2970 Gastritis, unspecified, without bleeding: Secondary | ICD-10-CM

## 2022-01-24 DIAGNOSIS — R110 Nausea: Secondary | ICD-10-CM

## 2022-01-24 MED ORDER — PROMETHAZINE HCL 25 MG PO TABS: 25.0000 mg | ORAL_TABLET | Freq: Three times a day (TID) | ORAL | 6 refills | Status: DC | PRN

## 2022-01-24 MED ORDER — ESOMEPRAZOLE MAGNESIUM 20 MG PO CPDR: 20.0000 mg | DELAYED_RELEASE_CAPSULE | Freq: Every day | ORAL | 0 refills | Status: DC

## 2022-01-24 NOTE — Patient Instructions (Signed)
We have sent the following medications to your pharmacy for you to pick up at your convenience: Nexium and Phenergan  Follow up as needed.   _______________________________________________________  If you are age 47 or older, your body mass index should be between 23-30. Your Body mass index is 44.6 kg/m. If this is out of the aforementioned range listed, please consider follow up with your Primary Care Provider.  If you are age 11 or younger, your body mass index should be between 19-25. Your Body mass index is 44.6 kg/m. If this is out of the aformentioned range listed, please consider follow up with your Primary Care Provider.   ________________________________________________________  The [facility] providers would like to encourage you to use [facility] to communicate with providers for non-urgent requests or questions.  Due to long hold times on the telephone, sending your provider a message by [facility] may be a faster and more efficient way to get a response.  Please allow 48 business hours for a response.  Please remember that this is for non-urgent requests.  _______________________________________________________  Thank you for choosing me and [facility].  Dr. Rush Landmark

## 2022-01-24 NOTE — Progress Notes (Signed)
[place] VISIT   Primary Care Provider Fenton Foy, NP [place] [address] [place] [address] [phone]   Patient Profile: Lori Shea is a 46 y.o. female with a pmh significant for diabetes, nephrolithiasis, hypertension, asthma, anxiety, migraines, GERD, status post cholecystectomy, hemorrhagic gastritis, diverticulosis, colon polyps (TA's), reported food allergies.  The patient presents to the [facility] for an evaluation and management of problem(s) noted below:  Problem List 1. Chronic nausea   2. Gastroesophageal reflux disease without esophagitis   3. Chronic diarrhea   4. Gastritis without bleeding, unspecified chronicity, unspecified gastritis type   5. Hx of adenomatous colonic polyps     History of Present Illness Please see prior notes for full details of HPI.  Interval History I last saw the patient at the time of her endoscopic evaluation.  Since then she has continued to struggle with issues of loose bowel movements.  But she noted that the magnesium supplement that she was taking to help with joint aches and anxiety could be causing some issues so she stopped that the last few weeks and has had a significant improvement in her bowel habits.  The patient is noting some issues from an anxiety standpoint but overall feels much better.  For some reason she was never able to get an affordable cholestyramine dosing even though good Rx and looked like it had the opportunity to have a cheaper price and she is being quoted by the pharmacy.  The patient otherwise is not having any other changes in her chronic symptoms of nausea.  She tries not to take antiemetic therapy unless things are progressive.  She does have some symptoms of early satiety at times but her diabetes has been better controlled, she has never been tested for gastroparesis.  GI Review of Systems Positive as above Negative for dysphagia,  odynophagia, melena, hematochezia  Review of Systems General: Denies fevers/chills/weight loss unintentionally Cardiovascular: Denies chest pain Pulmonary: Denies shortness of breath Gastroenterological: See HPI Genitourinary: Denies darkened urine Hematological: Denies easy bruising/bleeding Dermatological: Denies jaundice Psychological: Mood is stable   Medications Current Outpatient Medications  Medication Sig Dispense Refill   albuterol (PROVENTIL) (2.5 MG/3ML) 0.083% nebulizer solution Take 3 mLs (2.5 mg total) by nebulization every 6 (six) hours as needed for wheezing or shortness of breath. 75 mL 6   albuterol (VENTOLIN HFA) 108 (90 Base) MCG/ACT inhaler INHALE 2 PUFFS INTO THE LUNGS EVERY 4 HOURS AS NEEDED FOR FOR WHEEZING OR SHORTNESS OF BREATH 8.5 g 2   amitriptyline (ELAVIL) 25 MG tablet TAKE 1 TABLET BY MOUTH NIGHTLY AT BEDTIME 30 tablet 11   cetirizine (ZYRTEC) 10 MG tablet Take 1 tablet (10 mg total) by mouth daily. (Patient taking differently: Take 10 mg by mouth at bedtime.) 30 tablet 11   cyclobenzaprine (FLEXERIL) 5 MG tablet Take 1 tablet (5 mg total) by mouth 3 (three) times daily as needed for muscle spasms. 10 tablet 0   EASY COMFORT PEN NEEDLES 32G X 4 MM MISC use as directed test 2 times daily 100 each 3   FARXIGA 10 MG TABS tablet TAKE 1 TABLET BY MOUTH ONCE DAILY 90 tablet 0   fluconazole (DIFLUCAN) 150 MG tablet Take 1 tablet (150 mg total) by mouth every 3 (three) days. 4 tablet 0   fluticasone-salmeterol (ADVAIR DISKUS) 100-50 MCG/ACT AEPB Inhale 1 puff into the lungs 2 (two) times daily. 60 each 11   ibuprofen (ADVIL) 600 MG tablet Take 1  tablet (600 mg total) by mouth every 6 (six) hours as needed. 30 tablet 0   insulin lispro (HUMALOG KWIKPEN) 200 UNIT/ML KwikPen Inject 10-14 Units into the skin 3 (three) times daily. 6 mL 1   Insulin Pen Needle (PEN NEEDLES) 32G X 5 MM MISC 100 each by Does not apply route 2 (two) times daily. 100 each 3   meloxicam  (MOBIC) 15 MG tablet Take 1 tablet (15 mg total) by mouth daily. (Patient taking differently: Take 15 mg by mouth as needed.) 30 tablet 11   metFORMIN (GLUCOPHAGE) 500 MG tablet Take 1 tablet (500 mg total) by mouth 2 (two) times daily with a meal. 60 tablet 11   montelukast (SINGULAIR) 10 MG tablet Take 1 tablet (10 mg total) by mouth daily. 30 tablet 11   olmesartan-hydrochlorothiazide (BENICAR HCT) 40-25 MG tablet Take 1 tablet by mouth daily. 90 tablet 3   rizatriptan (MAXALT-MLT) 10 MG disintegrating tablet Dissolve 1 tablet in mouth as needed for migraine. May repeat in 2 hours if needed 12 tablet 0   esomeprazole (NEXIUM) 20 MG capsule Take 1 capsule (20 mg total) by mouth at bedtime. 90 capsule 0   MAGNESIUM CITRATE PO Take 1 capsule by mouth every morning.  (Patient not taking: Reported on 01/24/2022)     oxyCODONE-acetaminophen (PERCOCET/ROXICET) 5-325 MG tablet Take 1 tablet by mouth every 6 (six) hours as needed. (Patient not taking: Reported on 11/09/2021) 10 tablet 0   promethazine (PHENERGAN) 25 MG tablet Take 1 tablet (25 mg total) by mouth every 8 (eight) hours as needed. 30 tablet 6   simvastatin (ZOCOR) 10 MG tablet Take 1 tablet (10 mg total) by mouth at bedtime. (Patient not taking: Reported on 11/09/2021) 90 tablet 3   No current facility-administered medications for this visit.    Allergies Allergies  Allergen Reactions   Corn-Containing Products Hives    "really bad asthma attacks"   Honey Shortness Of Breath   Mango Flavor Hives and Swelling    Pt allergic to all mango - tongue and throat swelling   Glipizide     Other reaction(s): Other bad yeast infection   Liraglutide Nausea And Vomiting    victoza    Histories Past Medical History:  Diagnosis Date   Allergy    Foods:  mango, honey, corn.  Allergic to "everything tested"  15 years ago in [place]   Anemia    with pregnancy   Anxiety    Arthritis    Shoulder and hips   Asthma age 6 yo   Triggers:  cold  weather, exercise, allergens, anxiety, URIs   Cholelithiasis age 50 yo   Asymptomatic   Closed avulsion fracture of lateral malleolus of right fibula 12/27/2016   COVID-19    Diabetes mellitus type 2 2012   Was diagnosed 2 years earlier with PCOS   Elevated liver enzymes 09/13/2016   none since 2018   GERD (gastroesophageal reflux disease)    Heart murmur    with pregnancy 20 yrs ago per pt on 10-12-2021   History of kidney stones    Hypertension 2015   Kidney stones    family history of renal failure   Migraines 2009   Morbid obesity ([place])    PCOS (polycystic ovarian syndrome) 2010   Pneumonia    several years ago   Wears glasses    Past Surgical History:  Procedure Laterality Date   CHOLECYSTECTOMY N/A 12/01/2019   Procedure: LAPAROSCOPIC CHOLECYSTECTOMY;  Surgeon: Armandina Gemma, MD;  Location: [facility];  Service: General;  Laterality: N/A;   colonscopy and endoscopy  05/25/2021   COLPOSCOPY N/A 2021   CYST EXCISION     DILITATION & CURRETTAGE/HYSTROSCOPY WITH NOVASURE ABLATION N/A 10/18/2021   Procedure: DILATATION & CURETTAGE/HYSTEROSCOPY WITH NOVASURE ABLATION;  Surgeon: Aletha Halim, MD;  Location: [facility];  Service: Gynecology;  Laterality: N/A;   LAPAROSCOPIC TUBAL LIGATION Bilateral 01/28/2019   Procedure: LAPAROSCOPIC TUBAL LIGATION AND PAP SMEAR;  Surgeon: Aletha Halim, MD;  Location: [facility];  Service: Gynecology;  Laterality: Bilateral;   TONSILLECTOMY AND ADENOIDECTOMY Bilateral 12/23/2018   Procedure: TONSILLECTOMY AND ADENOIDECTOMY;  Surgeon: Leta Baptist, MD;  Location: [place];  Service: ENT;  Laterality: Bilateral;   Social History   Socioeconomic History   Marital status: Divorced    Spouse name: Not on file   Number of children: 1   Years of education: some comm. college   Highest education level: Not on file  Occupational History   Occupation: CNA    Comment: [employer]  Tobacco Use    Smoking status: Never   Smokeless tobacco: Never  Vaping Use   Vaping Use: Never used  Substance and Sexual Activity   Alcohol use: No   Drug use: No   Sexual activity: Not Currently    Birth control/protection: Surgical    Comment: Sprintec.  Tubal ligation  Other Topics Concern   Not on file  Social History Narrative   Originally from [place], [place] to [place] in 1988.   In [place] in [place] at home with daughter on [place].   Social Determinants of Health   Financial Resource Strain: Not on file  Food Insecurity: No Food Insecurity (10/02/2021)   Hunger Vital Sign    Worried About Running Out of Food in the Last Year: Never true    Ran Out of Food in the Last Year: Never true  Transportation Needs: No Transportation Needs (10/02/2021)   PRAPARE - Hydrologist (Medical): No    Lack of Transportation (Non-Medical): No  Physical Activity: Not on file  Stress: Not on file  Social Connections: Not on file  Intimate Partner Violence: Not on file   Family History  Problem Relation Age of Onset   Healthy Mother    Diabetes Father    Hyperlipidemia Father    Hypertension Father    Heart disease Father    Kidney disease Father        Developed after 2nd CABG   Colon polyps Father    Diabetes Paternal Grandfather    Heart disease Paternal Grandfather    Hyperlipidemia Paternal Grandfather    Hypertension Paternal Grandfather    Kidney disease Paternal Grandfather    I have reviewed her medical, social, and family history in detail and updated the electronic medical record as necessary.    PHYSICAL EXAMINATION  BP (!) 122/92   Pulse 96   Ht '5\' 5"'$  (1.651 m)   Wt 268 lb (121.6 kg)   BMI 44.60 kg/m  Wt Readings from Last 3 Encounters:  01/24/22 268 lb (121.6 kg)  11/09/21 272 lb (123.4 kg)  11/01/21 272 lb (123.4 kg)  GEN: NAD, appears stated age, doesn't appear chronically ill PSYCH: Cooperative,  without pressured speech EYE: Conjunctivae pink, sclerae anicteric ENT: MMM CV: Nontachycardic RESP: No audible wheezing GI: NABS, soft, protuberant abdomen, rounded, no rebound  MSK/EXT: No significant lower  extremity edema SKIN: No jaundice NEURO:  Alert & Oriented x 3, no focal deficits   REVIEW OF DATA  I reviewed the following data at the time of this encounter:  GI Procedures and Studies  February 2023 EGD - No gross lesions in esophagus. Z-line irregular, 35 cm from the incisors. - Erythematous mucosa in the greater curvature of the gastric body. No other gross lesions in the stomach. Biopsied. - No gross lesions in the duodenal bulb, in the first portion of the duodenum and in the second portion of the duodenum.  February 2023 colonoscopy - Hemorrhoids found on digital rectal exam. - The examined portion of the ileum was normal. - Two 5 to 12 mm polyps in the descending colon and in the ascending colon, removed with a cold snare. Resected and retrieved. - Diverticulosis in the ascending colon. - Normal mucosa in the entire examined colon otherwise. - Non-bleeding non-thrombosed internal hemorrhoids.  Pathology Diagnosis 1. Surgical [P], gastric, random - ACUTE GASTRITIS, EROSIVE, MILDLY HEMORRHAGIC WITH VASCULAR FIBRIN THROMBI, INVOLVING OXYNTIC MUCOSA. - NEGATIVE FOR HELICOBACTER PYLORI ORGANISMS (H. PYLORI IMMUNOHISTOCHEMICAL (IHC) STAIN EXAMINED, WITH ADEQUATE CONTROLS). - NEGATIVE FOR INTESTINAL METAPLASIA. - NEGATIVE FOR MALIGNANCY. 2. Surgical [P], colon, ascending, descending, polyp (2) - TUBULAR ADENOMAS, NEGATIVE FOR HIGH-GRADE DYSPLASIA. - NEGATIVE FOR MALIGNANCY.  Laboratory Studies  Reviewed those in epic  Imaging Studies  October 2020 CT abdomen pelvis with contrast IMPRESSION: 1. Ventral abdominal wall subcutaneous stranding with trace gas compatible with recent surgery. No abscess or drainable fluid collection. 2. Bilateral tubal ligation clips  in place with no adverse features. 3. Little renal contrast excretion on the delayed images raising the possibility of acute renal insufficiency. But otherwise negative kidneys, ureters, and urinary bladder. 4. Otherwise negative CT appearance of the abdomen and pelvis.  July 2021 abdominal ultrasound IMPRESSION: Fatty infiltration of the liver.   Gallstones without complicating factors.   ASSESSMENT  Ms. Riccardi is a 46 y.o. female with a pmh significant for diabetes, nephrolithiasis, hypertension, asthma, anxiety, migraines, GERD, status post cholecystectomy, hemorrhagic gastritis, diverticulosis, colon polyps (TA's), reported food allergies.  The patient is seen today for evaluation and management of:  1. Chronic nausea   2. Gastroesophageal reflux disease without esophagitis   3. Chronic diarrhea   4. Gastritis without bleeding, unspecified chronicity, unspecified gastritis type   5. Hx of adenomatous colonic polyps    The patient is hemodynamically stable.  Clinically she is improved now that she has held her magnesium supplement in regards to her diarrheal symptoms.  Hopefully this persists.  She is at risk of bile salt diarrhea and if in the future we need to retry getting her cholestyramine we certainly can do that or get her on WelChol.  We will hold on a new prescription for now.  We will see how her nausea symptoms develop over the course of the coming weeks/months and if she continues to have issues she may be at risk of gastroparesis due to her underlying diabetes and so we will need to have a gastric emptying study considered.  She had some hemorrhagic gastritis but no evidence of H. pylori we will plan to hold on endoscopic reevaluation per our discussion today but could certainly consider that again in the future.  She will be due for colon polyp/colon cancer screening in 3 years.  The patient will follow-up with [place] on an as-needed basis.  She may get some benefit from  consideration of further weight loss but could be  a candidate in the future to consider surgical or endoscopic fundoplication.  Would need to consider pH impedance testing and manometry prior to referral however.  All patient questions were answered to the best of my ability, and the patient agrees to the aforementioned plan of action with follow-up as indicated.   PLAN  Continue PPI once daily Phenergan as needed Consider gastric emptying study in the future Only on repeat endoscopic evaluation for hemorrhagic gastritis for now Colonoscopy 2026 Consideration of fundoplication referral in future Consider cholestyramine use in future pending patient's diarrheal symptoms and hopefully they continue to be stable with her being off of magnesium supplementation As needed follow-up   No orders of the defined types were placed in this encounter.   New Prescriptions   No medications on file   Modified Medications   Modified Medication Previous Medication   ESOMEPRAZOLE (NEXIUM) 20 MG CAPSULE esomeprazole (NEXIUM) 20 MG capsule      Take 1 capsule (20 mg total) by mouth at bedtime.    TAKE 1 CAPSULE BY MOUTH AT BEDTIME   PROMETHAZINE (PHENERGAN) 25 MG TABLET promethazine (PHENERGAN) 25 MG tablet      Take 1 tablet (25 mg total) by mouth every 8 (eight) hours as needed.    Take 25 mg by mouth every 8 (eight) hours as needed.    Planned Follow Up No follow-ups on file.   Total Time in Face-to-Face and in Coordination of Care for patient including independent/personal interpretation/review of prior testing, medical history, examination, medication adjustment, communicating results with the patient directly, and documentation within the EHR is 25 minutes.   Justice Britain, MD [facility] Advanced [facility]oscopy Office # [phone]

## 2022-01-25 ENCOUNTER — Encounter: Payer: Self-pay | Admitting: Gastroenterology

## 2022-01-25 DIAGNOSIS — K219 Gastro-esophageal reflux disease without esophagitis: Secondary | ICD-10-CM | POA: Insufficient documentation

## 2022-01-25 DIAGNOSIS — K529 Noninfective gastroenteritis and colitis, unspecified: Secondary | ICD-10-CM | POA: Insufficient documentation

## 2022-01-25 DIAGNOSIS — K2970 Gastritis, unspecified, without bleeding: Secondary | ICD-10-CM | POA: Insufficient documentation

## 2022-01-25 DIAGNOSIS — Z86010 Personal history of colonic polyps: Secondary | ICD-10-CM | POA: Insufficient documentation

## 2022-01-25 DIAGNOSIS — R110 Nausea: Secondary | ICD-10-CM | POA: Insufficient documentation

## 2022-01-30 ENCOUNTER — Telehealth: Payer: Self-pay

## 2022-01-30 NOTE — Telephone Encounter (Signed)
Not listed in as active medication.

## 2022-01-30 NOTE — Telephone Encounter (Signed)
[place]

## 2022-01-31 ENCOUNTER — Other Ambulatory Visit: Payer: Self-pay

## 2022-01-31 ENCOUNTER — Other Ambulatory Visit: Payer: Self-pay | Admitting: Nurse Practitioner

## 2022-01-31 DIAGNOSIS — E1165 Type 2 diabetes mellitus with hyperglycemia: Secondary | ICD-10-CM

## 2022-01-31 MED ORDER — BASAGLAR KWIKPEN 100 UNIT/ML ~~LOC~~ SOPN: 40.0000 [IU] | PEN_INJECTOR | Freq: Two times a day (BID) | SUBCUTANEOUS | 3 refills | Status: DC

## 2022-01-31 NOTE — Progress Notes (Signed)
Filled per provider request.

## 2022-01-31 NOTE — Telephone Encounter (Signed)
Medication sent.

## 2022-01-31 NOTE — Telephone Encounter (Signed)
Could you please verify this medication with pharmacy and patient. I do not see this on the med list. Please send correct/verified dosage back as RX request. Thanks.

## 2022-02-01 ENCOUNTER — Ambulatory Visit: Payer: Self-pay

## 2022-02-05 ENCOUNTER — Other Ambulatory Visit: Payer: Self-pay | Admitting: Nurse Practitioner

## 2022-02-05 DIAGNOSIS — G43001 Migraine without aura, not intractable, with status migrainosus: Secondary | ICD-10-CM

## 2022-02-19 ENCOUNTER — Other Ambulatory Visit: Payer: Self-pay | Admitting: Gastroenterology

## 2022-02-19 DIAGNOSIS — K2970 Gastritis, unspecified, without bleeding: Secondary | ICD-10-CM

## 2022-02-19 DIAGNOSIS — K219 Gastro-esophageal reflux disease without esophagitis: Secondary | ICD-10-CM

## 2022-02-19 DIAGNOSIS — K229 Disease of esophagus, unspecified: Secondary | ICD-10-CM

## 2022-02-27 ENCOUNTER — Encounter: Payer: Self-pay | Admitting: Obstetrics and Gynecology

## 2022-03-02 ENCOUNTER — Ambulatory Visit: Payer: Commercial Managed Care - HMO | Admitting: Podiatrist

## 2022-03-02 ENCOUNTER — Encounter: Payer: Self-pay | Admitting: Podiatrist

## 2022-03-02 ENCOUNTER — Ambulatory Visit (INDEPENDENT_AMBULATORY_CARE_PROVIDER_SITE_OTHER): Payer: Commercial Managed Care - HMO

## 2022-03-02 DIAGNOSIS — G8929 Other chronic pain: Secondary | ICD-10-CM

## 2022-03-02 DIAGNOSIS — M7662 Achilles tendinitis, left leg: Secondary | ICD-10-CM

## 2022-03-02 DIAGNOSIS — M79672 Pain in left foot: Secondary | ICD-10-CM

## 2022-03-02 NOTE — Patient Instructions (Signed)
Achilles Tendinitis  Achilles tendinitis is inflammation of the tough, cord-like band that attaches the lower leg muscles to the heel bone (Achilles tendon). This is usually caused by overusing the tendon and the ankle joint. Achilles tendinitis usually gets better over time with treatment and caring for yourself at home. It can take weeks or months to heal completely. What are the causes? This condition may be caused by: A sudden increase in exercise or activity, such as running. Doing the same exercises or activities, such as jumping, over and over. Not warming up calf muscles before exercising. Exercising in shoes that are worn out or not made for exercise. Having arthritis or a bone growth (spur) on the back of the heel bone. This can rub against the tendon and hurt it. Age-related wear and tear. Tendons become less flexible with age and are more likely to be injured. What are the signs or symptoms? Common symptoms of this condition include: Pain in the Achilles tendon or in the back of the leg, just above the heel. The pain usually gets worse with exercise. Stiffness or soreness in the back of the leg, especially in the morning. Swelling of the skin over the Achilles tendon. Thickening of the tendon. Trouble standing on tiptoe. How is this diagnosed? This condition is diagnosed based on your symptoms and a physical exam. You may have tests, including: X-rays. MRI. How is this treated? The goal of treatment is to relieve symptoms and help your injury heal. Treatment may include: Decreasing or stopping activities that caused the tendinitis. This may mean switching to low-impact exercises like biking or swimming. Icing the injured area. Doing physical therapy, including strengthening and stretching exercises. Taking NSAIDs, such as ibuprofen, to help relieve pain and swelling. Using supportive shoes, wraps, heel lifts, or a walking boot (air cast). Having surgery. This may be done if  your symptoms do not improve after other treatments. Using high-energy shock wave impulses to stimulate the healing process (extracorporeal shock wave therapy). This is rare. Having an injection of medicines that help relieve inflammation (corticosteroids). This is rare. Follow these instructions at home: If you have an air cast: Wear the air cast as told by your health care provider. Remove it only as told by your health care provider. Loosen it if your toes tingle, become numb, or turn cold and blue. Keep it clean. If the air cast is not waterproof: Do not let it get wet. Cover it with a watertight covering when you take a bath or shower. Managing pain, stiffness, and swelling  If directed, put ice on the injured area. To do this: If you have a removable air cast, remove it as told by your health care provider. Put ice in a plastic bag. Place a towel between your skin and the bag. Leave the ice on for 20 minutes, 2-3 times a day. Move your toes often to reduce stiffness and swelling. Raise (elevate) your foot above the level of your heart while you are sitting or lying down. Activity Gradually return to your normal activities as told by your health care provider. Ask your health care provider what activities are safe for you. Do not do activities that cause pain. Consider doing low-impact exercises, like cycling or swimming. Ask your health care provider when it is safe to drive if you have an air cast on your foot. If physical therapy was prescribed, do exercises as told by your health care provider or physical therapist. General instructions If directed, wrap your   foot with an elastic bandage or other wrap. This can help to keep your tendon from moving too much while it heals. Your health care provider will show you how to wrap your foot correctly. Wear supportive shoes or heel lifts only as told by your health care provider. Take over-the-counter and prescription medicines only as  told by your health care provider. Keep all follow-up visits as told by your health care provider. This is important.   Stretching and range-of-motion exercises-  do stretching exercises several times a day and before going to bed.  These use a resistance band that is helpful for stretching the calf.  Hold for 30 seconds and repeat x 10 repetitions.   These exercises: Warm up your muscles and joints. Improve the movement and flexibility of your lower leg and heel. Relieve pain and stiffness. Gastrocnemius and soleus stretch This exercise is also called a calf stretch. It stretches the muscles in the back of the lower leg. These are the gastrocnemius (closest to the skin) and the soleus (deep in the leg). Sit on the floor with your left / right leg extended. Loop a belt or towel around your left / right foot, across the top part of the walking surface (ball) of your foot. Keep your left / right ankle and foot relaxed and keep your knee straight while you use the belt or towel to pull your foot and ankle toward you. You should feel a gentle stretch behind your calf or knee. Hold this position for __________ seconds. Repeat __________ times. Complete this exercise __________ times a day. Strengthening exercises These exercises build strength and endurance in your lower leg. Endurance is the ability to use your muscles for a long time, even after they get tired. Plantar flexion with a band  Sit on the floor with your left / right leg extended. Loop a rubber exercise band or tube around your left / right foot, across the top part of the walking surface (ball) of your foot. Hold the two ends in your hands. The band or tube should be slightly tense when your foot is relaxed. Slowly point your toes downward, pushing them away from you (plantar flexion). Hold this position for __________ seconds. Slowly return your foot back to the starting position. Repeat __________ times. Complete this exercise  __________ times a day.

## 2022-03-02 NOTE — Progress Notes (Unsigned)
Chief Complaint  Patient presents with   Foot Problem    Pain located in the left heel, radiating up to the calve. Pain has been going on for several months now, Treatment includes wrappingand ice baths , ineffective     HPI: Patient is 46 y.o. female who presents today for posterior lateral heel pain left-  radiates up calf. Its been going on for several months.  She also relates a sore toenail on the left hallux with pressure from most of her shoes.  She is diabetic and wants to be sure its OK.    Patient Active Problem List   Diagnosis Date Noted   Chronic diarrhea 01/25/2022   History of colonic polyps 01/25/2022   Gastroesophageal reflux disease 01/25/2022   Chronic nausea 01/25/2022   Gastritis without bleeding 01/25/2022   Stress incontinence of urine 01/02/2021   Skin irritation 01/02/2021   Hepatic steatosis 12/01/2019   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/2019   Hemoglobin A1C greater than 9%, indicating poor diabetic control 03/23/2019   Hyperglycemia 03/23/2019   Class 3 severe obesity due to excess calories with serious comorbidity and body mass index (BMI) of 40.0 to 44.9 in adult ([place]) 03/23/2019   Yeast infection 03/23/2019   BMI 40.0-44.9, adult ([place]) 03/09/2019   Cervical dysplasia 07/31/2017   Alopecia of scalp 05/09/2017   Pain in left toe(s) 12/27/2016   Morbid obesity ([place]) 09/13/2016   Elevated liver enzymes 09/13/2016   Allergy    Asthma    Hypertension 04/23/2013   Irregular periods 02/22/2011   Type 2 diabetes mellitus with hyperglycemia, with long-term current use of insulin ([place]) 04/23/2010   PCOS (polycystic ovarian syndrome) 04/23/2008   Migraines 04/24/2007    Current Outpatient Medications on File Prior to Visit  Medication Sig Dispense Refill   albuterol (PROVENTIL) (2.5 MG/3ML) 0.083% nebulizer solution Take 3 mLs (2.5 mg total) by nebulization every 6 (six) hours as needed for wheezing or shortness of breath. 75 mL 6    albuterol (VENTOLIN HFA) 108 (90 Base) MCG/ACT inhaler INHALE 2 PUFFS INTO THE LUNGS EVERY 4 HOURS AS NEEDED FOR FOR WHEEZING OR SHORTNESS OF BREATH 8.5 g 2   amitriptyline (ELAVIL) 25 MG tablet TAKE 1 TABLET BY MOUTH NIGHTLY AT BEDTIME 30 tablet 11   cetirizine (ZYRTEC) 10 MG tablet Take 1 tablet (10 mg total) by mouth daily. (Patient taking differently: Take 10 mg by mouth at bedtime.) 30 tablet 11   cholestyramine (QUESTRAN) 4 g packet take 1 PACKET BY MOUTH AS NEEDED FOR DIARRHEA. Not TO be taken WITHIN 4 hours OF any other medication 60 packet 0   cyclobenzaprine (FLEXERIL) 5 MG tablet Take 1 tablet (5 mg total) by mouth 3 (three) times daily as needed for muscle spasms. 10 tablet 0   EASY COMFORT PEN NEEDLES 32G X 4 MM MISC use as directed test 2 times daily 100 each 3   esomeprazole (NEXIUM) 20 MG capsule Take 1 capsule (20 mg total) by mouth at bedtime. 90 capsule 0   FARXIGA 10 MG TABS tablet TAKE 1 TABLET BY MOUTH ONCE DAILY 90 tablet 0   fluconazole (DIFLUCAN) 150 MG tablet Take 1 tablet (150 mg total) by mouth every 3 (three) days. 4 tablet 0   fluticasone-salmeterol (ADVAIR DISKUS) 100-50 MCG/ACT AEPB Inhale 1 puff into the lungs 2 (two) times daily. 60 each 11   ibuprofen (ADVIL) 600 MG tablet Take 1 tablet (600 mg total) by mouth every 6 (six) hours as needed. 30 tablet  0   Insulin Glargine (BASAGLAR KWIKPEN) 100 UNIT/ML Inject 40 Units into the skin 2 (two) times daily. 30 mL 3   insulin lispro (HUMALOG KWIKPEN) 200 UNIT/ML KwikPen Inject 10-14 Units into the skin 3 (three) times daily. 6 mL 1   Insulin Pen Needle (PEN NEEDLES) 32G X 5 MM MISC 100 each by Does not apply route 2 (two) times daily. 100 each 3   MAGNESIUM CITRATE PO Take 1 capsule by mouth every morning.     meloxicam (MOBIC) 15 MG tablet Take 1 tablet (15 mg total) by mouth daily. (Patient taking differently: Take 15 mg by mouth as needed.) 30 tablet 11   metFORMIN (GLUCOPHAGE) 500 MG tablet Take 1 tablet (500 mg total)  by mouth 2 (two) times daily with a meal. 60 tablet 11   montelukast (SINGULAIR) 10 MG tablet Take 1 tablet (10 mg total) by mouth daily. 30 tablet 11   olmesartan-hydrochlorothiazide (BENICAR HCT) 40-25 MG tablet Take 1 tablet by mouth daily. 90 tablet 3   oxyCODONE-acetaminophen (PERCOCET/ROXICET) 5-325 MG tablet Take 1 tablet by mouth every 6 (six) hours as needed. 10 tablet 0   promethazine (PHENERGAN) 25 MG tablet Take 1 tablet (25 mg total) by mouth every 8 (eight) hours as needed. 30 tablet 6   rizatriptan (MAXALT-MLT) 10 MG disintegrating tablet Dissolve 1 tablet in mouth as needed for migraine. May repeat in 2 hours if needed 12 tablet 0   simvastatin (ZOCOR) 10 MG tablet Take 1 tablet (10 mg total) by mouth at bedtime. 90 tablet 3   No current facility-administered medications on file prior to visit.    Allergies  Allergen Reactions   Corn-Containing Products Hives    "really bad asthma attacks"   Honey Shortness Of Breath   Mango Flavor Hives and Swelling    Pt allergic to all mango - tongue and throat swelling   Glipizide     Other reaction(s): Other bad yeast infection   Liraglutide Nausea And Vomiting    victoza    Review of Systems No fevers, chills, nausea, muscle aches, no difficulty breathing, no calf pain, no chest pain or shortness of breath.   Physical Exam  GENERAL APPEARANCE: Alert, conversant. Appropriately groomed. No acute distress.   VASCULAR: Pedal pulses palpable 2/4 DP and 2/4 PT bilateral.  Capillary refill time is immediate to all digits,  Proximal to distal cooling is warm to warm.  Digital perfusion adequate.   NEUROLOGIC: sensation is intact to 5.07 monofilament at 5/5 sites bilateral.  Light touch is intact bilateral, vibratory sensation intact bilateral. Negative tinnels sign when tapping along the tarsal canal.   MUSCULOSKELETAL: acceptable muscle strength, tone and stability bilateral.  Decrease range of motion at the ankle joint left in  comparison with the right.  Pain on palpation posterior lateral heel at lateral insertion of achilles tendon.  Palpable posterior bone spur noted in this region as well.    DERMATOLOGIC: skin is warm, supple, and dry.  Color, texture, and turgor of skin within normal limits.  No open wounds are noted.  No preulcerative lesions are seen.  Left hallux nail is mildly incurvated on the medial and lateral borders. No pain with direct pressure noted on exam however, Juliann Pulse relates in shoes there is pain in the toe with pressure.    Xrays show a moderate heel spur on the posterior calcaneus on lateral view. Small plantar heel spur also noted. No acute fractures or dislocation present. Normal joint spaces present.  Assessment     ICD-10-CM   1. Heel pain, chronic, left  M79.672 DG Foot Complete Left   G89.29     2. Tendonitis, Achilles, left  M76.62        Plan  Exam findings and treatment recommendations are discussed.  Discussed xrays and the presence of the heel spur as well as thickening of the achilles tendon in this area.  Diagnosed her with insertional achilles tendonitis and recommended heel lifts and boot immobilization along with use of voltaren gel at home along with stretching exercises.  She will be seen back in 4 weeks for recheck.  Also discussed if the toenail continues to be painful we could remove the sides for her in the future. For now she will try to let the nail grow longer and cut straight across to see if this helps.

## 2022-03-07 ENCOUNTER — Other Ambulatory Visit: Payer: Self-pay | Admitting: Podiatrist

## 2022-03-07 DIAGNOSIS — M7662 Achilles tendinitis, left leg: Secondary | ICD-10-CM

## 2022-03-07 DIAGNOSIS — G8929 Other chronic pain: Secondary | ICD-10-CM

## 2022-03-13 ENCOUNTER — Other Ambulatory Visit: Payer: Self-pay | Admitting: Nurse Practitioner

## 2022-03-16 ENCOUNTER — Other Ambulatory Visit: Payer: Self-pay | Admitting: Nurse Practitioner

## 2022-04-06 ENCOUNTER — Other Ambulatory Visit: Payer: Self-pay

## 2022-04-06 MED ORDER — HUMALOG KWIKPEN 200 UNIT/ML ~~LOC~~ SOPN: 10.0000 [IU] | PEN_INJECTOR | Freq: Three times a day (TID) | SUBCUTANEOUS | 1 refills | Status: DC

## 2022-04-11 ENCOUNTER — Other Ambulatory Visit: Payer: Self-pay | Admitting: Nurse Practitioner

## 2022-04-25 ENCOUNTER — Encounter: Payer: Self-pay | Admitting: Nurse Practitioner

## 2022-04-25 ENCOUNTER — Ambulatory Visit (INDEPENDENT_AMBULATORY_CARE_PROVIDER_SITE_OTHER): Payer: BLUE CROSS/BLUE SHIELD | Admitting: Nurse Practitioner

## 2022-04-25 VITALS — BP 119/78 | HR 89 | Ht 65.0 in | Wt 267.4 lb

## 2022-04-25 DIAGNOSIS — Z794 Long term (current) use of insulin: Secondary | ICD-10-CM

## 2022-04-25 DIAGNOSIS — E1165 Type 2 diabetes mellitus with hyperglycemia: Secondary | ICD-10-CM

## 2022-04-25 LAB — POCT GLYCOSYLATED HEMOGLOBIN (HGB A1C): Hemoglobin A1C: 7.7 % — AB (ref 4.0–5.6)

## 2022-04-25 MED ORDER — BASAGLAR KWIKPEN 100 UNIT/ML ~~LOC~~ SOPN: 60.0000 [IU] | PEN_INJECTOR | Freq: Every day | SUBCUTANEOUS | 3 refills | Status: DC

## 2022-04-25 MED ORDER — TIRZEPATIDE 2.5 MG/0.5ML ~~LOC~~ SOAJ: 2.5000 mg | SUBCUTANEOUS | 0 refills | Status: DC

## 2022-04-25 MED ORDER — TIRZEPATIDE 5 MG/0.5ML ~~LOC~~ SOAJ: 5.0000 mg | SUBCUTANEOUS | 0 refills | Status: DC

## 2022-04-25 MED ORDER — HUMALOG KWIKPEN 200 UNIT/ML ~~LOC~~ SOPN: 10.0000 [IU] | PEN_INJECTOR | Freq: Three times a day (TID) | SUBCUTANEOUS | 1 refills | Status: DC

## 2022-04-25 MED ORDER — DEXCOM G7 SENSOR MISC: 1.0000 | 3 refills | Status: DC

## 2022-04-25 NOTE — Patient Instructions (Signed)

## 2022-04-25 NOTE — Progress Notes (Signed)
Endocrinology Consult Note       04/25/2022, 12:46 PM   Subjective:    Patient ID: Lori Shea, female    DOB: March 18, 1976.  Lori Shea is being seen in consultation for management of currently uncontrolled symptomatic diabetes requested by  Fenton Foy, NP.   Past Medical History:  Diagnosis Date   Allergy    Foods:  mango, honey, corn.  Allergic to "everything tested"  15 years ago in [place]   Anemia    with pregnancy   Anxiety    Arthritis    Shoulder and hips   Asthma age 15 yo   Triggers:  cold weather, exercise, allergens, anxiety, URIs   Cholelithiasis age 36 yo   Asymptomatic   Closed avulsion fracture of lateral malleolus of right fibula 12/27/2016   COVID-19    Diabetes mellitus type 2 2012   Was diagnosed 2 years earlier with PCOS   Elevated liver enzymes 09/13/2016   none since 2018   GERD (gastroesophageal reflux disease)    Heart murmur    with pregnancy 20 yrs ago per pt on 10-12-2021   History of kidney stones    Hypertension 2015   Kidney stones    family history of renal failure   Migraines 2009   Morbid obesity ([place])    PCOS (polycystic ovarian syndrome) 2010   Pneumonia    several years ago   Wears glasses     Past Surgical History:  Procedure Laterality Date   CHOLECYSTECTOMY N/A 12/01/2019   Procedure: LAPAROSCOPIC CHOLECYSTECTOMY;  Surgeon: Armandina Gemma, MD;  Location: [facility];  Service: General;  Laterality: N/A;   colonscopy and endoscopy  05/25/2021   COLPOSCOPY N/A 2021   CYST EXCISION     DILITATION & CURRETTAGE/HYSTROSCOPY WITH NOVASURE ABLATION N/A 10/18/2021   Procedure: DILATATION & CURETTAGE/HYSTEROSCOPY WITH NOVASURE ABLATION;  Surgeon: Aletha Halim, MD;  Location: [facility];  Service: Gynecology;  Laterality: N/A;   LAPAROSCOPIC TUBAL LIGATION Bilateral 01/28/2019   Procedure: LAPAROSCOPIC TUBAL LIGATION AND PAP SMEAR;   Surgeon: Aletha Halim, MD;  Location: [facility];  Service: Gynecology;  Laterality: Bilateral;   TONSILLECTOMY AND ADENOIDECTOMY Bilateral 12/23/2018   Procedure: TONSILLECTOMY AND ADENOIDECTOMY;  Surgeon: Leta Baptist, MD;  Location: [facility];  Service: ENT;  Laterality: Bilateral;    Social History   Socioeconomic History   Marital status: Divorced    Spouse name: Not on file   Number of children: 1   Years of education: some comm. college   Highest education level: Not on file  Occupational History   Occupation: CNA    Comment: [employer]  Tobacco Use   Smoking status: Never   Smokeless tobacco: Never  Vaping Use   Vaping Use: Never used  Substance and Sexual Activity   Alcohol use: No   Drug use: No   Sexual activity: Not Currently    Birth control/protection: Surgical    Comment: Sprintec.  Tubal ligation  Other Topics Concern   Not on file  Social History Narrative   Originally from [place], [place] to [place] in 1988.   In [place] in  1999   Lives at home with daughter on [address].   Social Determinants of Health   Financial Resource Strain: Not on file  Food Insecurity: No Food Insecurity (10/02/2021)   Hunger Vital Sign    Worried About Running Out of Food in the Last Year: Never true    Ran Out of Food in the Last Year: Never true  Transportation Needs: No Transportation Needs (10/02/2021)   PRAPARE - Hydrologist (Medical): No    Lack of Transportation (Non-Medical): No  Physical Activity: Not on file  Stress: Not on file  Social Connections: Not on file    Family History  Problem Relation Age of Onset   Healthy Mother    Diabetes Father    Hyperlipidemia Father    Hypertension Father    Heart disease Father    Kidney disease Father        Developed after 2nd CABG   Colon polyps Father    Diabetes Paternal Grandfather    Heart disease Paternal  Grandfather    Hyperlipidemia Paternal Grandfather    Hypertension Paternal Grandfather    Kidney disease Paternal Grandfather     Outpatient Encounter Medications as of 04/25/2022  Medication Sig   albuterol (PROVENTIL) (2.5 MG/3ML) 0.083% nebulizer solution Take 3 mLs (2.5 mg total) by nebulization every 6 (six) hours as needed for wheezing or shortness of breath.   albuterol (VENTOLIN HFA) 108 (90 Base) MCG/ACT inhaler INHALE 2 PUFFS INTO THE LUNGS EVERY 4 HOURS AS NEEDED FOR FOR WHEEZING OR SHORTNESS OF BREATH   amitriptyline (ELAVIL) 25 MG tablet TAKE 1 TABLET BY MOUTH NIGHTLY AT BEDTIME   cetirizine (ZYRTEC) 10 MG tablet Take 1 tablet (10 mg total) by mouth daily. (Patient taking differently: Take 10 mg by mouth at bedtime.)   cholestyramine (QUESTRAN) 4 g packet take 1 PACKET BY MOUTH AS NEEDED FOR DIARRHEA. Not TO be taken WITHIN 4 hours OF any other medication   Continuous Blood Gluc Sensor (DEXCOM G7 SENSOR) MISC Inject 1 Application into the skin as directed. Change sensor every 10 days as directed.   cyclobenzaprine (FLEXERIL) 5 MG tablet Take 1 tablet (5 mg total) by mouth 3 (three) times daily as needed for muscle spasms.   EASY COMFORT PEN NEEDLES 32G X 4 MM MISC use as directed test 2 times daily   esomeprazole (NEXIUM) 20 MG capsule Take 1 capsule (20 mg total) by mouth at bedtime.   fluconazole (DIFLUCAN) 150 MG tablet Take 1 tablet (150 mg total) by mouth every 3 (three) days.   fluticasone-salmeterol (ADVAIR DISKUS) 100-50 MCG/ACT AEPB Inhale 1 puff into the lungs 2 (two) times daily.   ibuprofen (ADVIL) 600 MG tablet Take 1 tablet (600 mg total) by mouth every 6 (six) hours as needed.   Insulin Pen Needle (PEN NEEDLES) 32G X 5 MM MISC 100 each by Does not apply route 2 (two) times daily.   meloxicam (MOBIC) 15 MG tablet Take 1 tablet (15 mg total) by mouth daily. (Patient taking differently: Take 15 mg by mouth as needed.)   metFORMIN (GLUCOPHAGE) 500 MG tablet Take 1 tablet  (500 mg total) by mouth 2 (two) times daily with a meal.   montelukast (SINGULAIR) 10 MG tablet Take 1 tablet (10 mg total) by mouth daily.   olmesartan-hydrochlorothiazide (BENICAR HCT) 40-25 MG tablet Take 1 tablet by mouth daily.   oxyCODONE-acetaminophen (PERCOCET/ROXICET) 5-325 MG tablet Take 1 tablet by mouth every 6 (six) hours  as needed.   promethazine (PHENERGAN) 25 MG tablet Take 1 tablet (25 mg total) by mouth every 8 (eight) hours as needed.   rizatriptan (MAXALT-MLT) 10 MG disintegrating tablet Dissolve 1 tablet in mouth as needed for migraine. May repeat in 2 hours if needed   tirzepatide [facility]) 2.5 MG/0.5ML Pen Inject 2.5 mg into the skin once a week.   tirzepatide [facility]) 5 MG/0.5ML Pen Inject 5 mg into the skin once a week.   [DISCONTINUED] FARXIGA 10 MG TABS tablet TAKE 1 TABLET BY MOUTH ONCE DAILY   [DISCONTINUED] Insulin Glargine (BASAGLAR KWIKPEN) 100 UNIT/ML Inject 40 Units into the skin 2 (two) times daily.   [DISCONTINUED] insulin lispro (HUMALOG KWIKPEN) 200 UNIT/ML KwikPen Inject 10-14 Units into the skin 3 (three) times daily.   Insulin Glargine (BASAGLAR KWIKPEN) 100 UNIT/ML Inject 60 Units into the skin at bedtime.   insulin lispro (HUMALOG KWIKPEN) 200 UNIT/ML KwikPen Inject 10-16 Units into the skin 3 (three) times daily.   [DISCONTINUED] MAGNESIUM CITRATE PO Take 1 capsule by mouth every morning. (Patient not taking: Reported on 04/25/2022)   [DISCONTINUED] simvastatin (ZOCOR) 10 MG tablet Take 1 tablet (10 mg total) by mouth at bedtime. (Patient not taking: Reported on 04/25/2022)   No facility-administered encounter medications on file as of 04/25/2022.    ALLERGIES: Allergies  Allergen Reactions   Corn-Containing Products Hives    "really bad asthma attacks"   Honey Shortness Of Breath   Mango Flavor Hives and Swelling    Pt allergic to all mango - tongue and throat swelling   Glipizide     Other reaction(s): Other bad yeast infection   Liraglutide  Nausea And Vomiting    victoza    VACCINATION STATUS: Immunization History  Administered Date(s) Administered   Pneumococcal Polysaccharide-23 12/18/2018   Pneumococcal-Unspecified 04/23/1997   Td 09/06/2004   Tdap 09/13/2016    Diabetes She presents for her initial diabetic visit. She has type 2 diabetes mellitus. Onset time: diagnosed at approx age of 49. Her disease course has been improving. Hypoglycemia symptoms include nervousness/anxiousness, sweats and tremors. Associated symptoms include polyuria and weight loss. There are no hypoglycemic complications. There are no diabetic complications. Risk factors for coronary artery disease include diabetes mellitus, dyslipidemia, obesity, hypertension and family history. Current diabetic treatment includes intensive insulin program and oral agent (dual therapy). She is compliant with treatment most of the time. Her weight is decreasing steadily. She is following a generally unhealthy diet. When asked about meal planning, she reported none. She has not had a previous visit with a dietitian. She participates in exercise daily. (She presents today for her consultation with no meter or logs to review.  Her POCT A1c today is 7.7%, improving from last A1c of 8.6%.  She does not monitor her glucose on a routine basis.  She drinks mostly water, some coffee with SF creamer and splenda, unsweet tea with splenda, and an occasional SF soda with a meal.  She is working on her meal routine, trying to start eating breakfast more often.  She is UTD on eye exam, has seen podiatry in the past.) An ACE inhibitor/angiotensin II receptor blocker is being taken. She sees a podiatrist.Eye exam is current.     Review of systems  Constitutional: + steadily decreasing body weight, current Body mass index is 44.5 kg/m., no fatigue, no subjective hyperthermia, no subjective hypothermia Eyes: no blurry vision, no xerophthalmia ENT: no sore throat, no nodules palpated in  throat, no dysphagia/odynophagia, no hoarseness  Cardiovascular: no chest pain, no shortness of breath, no palpitations, no leg swelling Respiratory: no cough, no shortness of breath Gastrointestinal: no nausea/vomiting/diarrhea Musculoskeletal: no muscle/joint aches, tendon injury to right foot? Skin: no rashes, no hyperemia Neurological: no tremors, no numbness, no tingling, no dizziness Psychiatric: no depression, no anxiety  Objective:     BP 119/78 (BP Location: Left Arm, Patient Position: Sitting, Cuff Size: Large)   Pulse 89   Ht '5\' 5"'$  (1.651 m)   Wt 267 lb 6.4 oz (121.3 kg)   BMI 44.50 kg/m   Wt Readings from Last 3 Encounters:  04/25/22 267 lb 6.4 oz (121.3 kg)  01/24/22 268 lb (121.6 kg)  11/09/21 272 lb (123.4 kg)     BP Readings from Last 3 Encounters:  04/25/22 119/78  01/24/22 (!) 122/92  11/09/21 118/80     Physical Exam- Limited  Constitutional:  Body mass index is 44.5 kg/m. , not in acute distress, normal state of mind Eyes:  EOMI, no exophthalmos Neck: Supple Cardiovascular: RRR, no murmurs, rubs, or gallops, no edema Respiratory: Adequate breathing efforts, no crackles, rales, rhonchi, or wheezing Musculoskeletal: no gross deformities, strength intact in all four extremities, no gross restriction of joint movements Skin:  no rashes, no hyperemia Neurological: no tremor with outstretched hands    CMP ( most recent) CMP     Component Value Date/Time   NA 140 10/18/2021 1307   NA 140 04/05/2021 1607   K 3.4 (L) 10/18/2021 1307   CL 104 10/18/2021 1307   CO2 24 01/04/2021 1249   GLUCOSE 175 (H) 10/18/2021 1307   BUN 18 10/18/2021 1307   BUN 12 04/05/2021 1607   CREATININE 0.60 10/18/2021 1307   CALCIUM 9.1 04/05/2021 1607   PROT 6.7 04/05/2021 1607   ALBUMIN 4.3 04/05/2021 1607   AST 25 04/05/2021 1607   ALT 33 01/04/2021 1249   ALKPHOS 103 04/05/2021 1607   BILITOT 0.2 04/05/2021 1607   GFRNONAA >60 01/04/2021 1249   GFRAA 119  03/03/2020 1246     Diabetic Labs (most recent): Lab Results  Component Value Date   HGBA1C 7.7 (A) 04/25/2022   HGBA1C 8.6 (H) 09/14/2021   HGBA1C 9.9 (A) 07/05/2021   HGBA1C 9.9 07/05/2021   HGBA1C 9.9 (A) 07/05/2021   HGBA1C 9.9 (A) 07/05/2021   MICROALBUR 4.03 (H) 02/09/2009     Lipid Panel ( most recent) Lipid Panel     Component Value Date/Time   CHOL 167 09/14/2021 1041   TRIG 185 (H) 09/14/2021 1041   HDL 40 09/14/2021 1041   CHOLHDL 4.2 09/14/2021 1041   CHOLHDL 3.6 Ratio 02/09/2009 2112   VLDL 36 02/09/2009 2112   [place] 95 09/14/2021 1041   LABVLDL 32 09/14/2021 1041      Lab Results  Component Value Date   TSH 2.780 02/11/2020   TSH 5.090 (H) 02/10/2018   TSH 4.370 04/08/2017   TSH 3.370 02/22/2011   TSH 2.259 02/07/2010   TSH 1.924 02/09/2009   FREET4 1.14 02/19/2018           Assessment & Plan:   1) Type 2 diabetes mellitus with hyperglycemia, with long-term current use of insulin ([place])  She presents today for her consultation with no meter or logs to review.  Her POCT A1c today is 7.7%, improving from last A1c of 8.6%.  She does not monitor her glucose on a routine basis.  She drinks mostly water, some coffee with SF creamer and splenda, unsweet tea with splenda,  and an occasional SF soda with a meal.  She is working on her meal routine, trying to start eating breakfast more often.  She is UTD on eye exam, has seen podiatry in the past.  - Lori Shea has currently uncontrolled symptomatic type 2 DM since 47 years of age, with most recent A1c of 7.7 %.   -Recent labs reviewed.  - I had a long discussion with her about the progressive nature of diabetes and the pathology behind its complications. -her diabetes is not currently complicated but she remains at a high risk for more acute and chronic complications which include CAD, CVA, CKD, retinopathy, and neuropathy. These are all discussed in detail with her.  The following Lifestyle  Medicine recommendations according to [facility]) were discussed and offered to patient and she agrees to start the journey:  A. Whole Foods, Plant-based plate comprising of fruits and vegetables, plant-based proteins, whole-grain carbohydrates was discussed in detail with the patient.   A list for source of those nutrients were also provided to the patient.  Patient will use only water or unsweetened tea for hydration. B.  The need to stay away from risky substances including alcohol, smoking; obtaining 7 to 9 hours of restorative sleep, at least 150 minutes of moderate intensity exercise weekly, the importance of healthy social connections,  and stress reduction techniques were discussed. C.  A full color page of  Calorie density of various food groups per pound showing examples of each food groups was provided to the patient.  - I have counseled her on diet and weight management by adopting a carbohydrate restricted/protein rich diet. Patient is encouraged to switch to unprocessed or minimally processed complex starch and increased protein intake (animal or plant source), fruits, and vegetables. -  she is advised to stick to a routine mealtimes to eat 3 meals a day and avoid unnecessary snacks (to snack only to correct hypoglycemia).   - she acknowledges that there is a room for improvement in her food and drink choices. - Suggestion is made for her to avoid simple carbohydrates from her diet including Cakes, Sweet Desserts, Ice Cream, Soda (diet and regular), Sweet Tea, Candies, Chips, Cookies, Store Bought Juices, Alcohol in Excess of 1-2 drinks a day, Artificial Sweeteners, Coffee Creamer, and "Sugar-free" Products. This will help patient to have more stable blood glucose profile and potentially avoid unintended weight gain.  - I have approached her with the following individualized plan to manage her diabetes and patient agrees:   -She is advised to adjust her  Basaglar to 60 units and take it before bedtime (was splitting doses twice daily).  She can adjust her Humalog to 10-16 units TID with meals if glucose is above 90 and she is eating (Specific instructions on how to titrate insulin dosage based on glucose readings given to patient in writing).  She demonstrated her ability to use the SSI chart properly to dose her insulin with me today.  -she is encouraged to start monitoring glucose 4 times daily, before meals and before bed, to log their readings on the clinic sheets provided, and bring them to review at follow up appointment in 3 months.  She could benefit from CGM device given her MDI (sample Dexcom G7 given and Rx sent to pharmacy).  - she is warned not to take insulin without proper monitoring per orders. - Adjustment parameters are given to her for hypo and hyperglycemia in writing. - she is encouraged  to call clinic for blood glucose levels less than 70 or above 300 mg /dl. - she is advised to continue Metformin 500 mg po twice daily after meals (to reduce GI upset), therapeutically suitable for patient . - her Wilder Glade will be discontinued, risk outweighs benefit for this patient.  Has history of vaginal yeast infections as a result of this medication.  - I discussed and initiated trial of Mounjaro 2.5 mg SQ weekly x 1 month, then will increase to therapeutic dose of 5 mg SQ weekly thereafter if tolerated well.  She had been on Victoza in the past and had nausea with it, but also notes she had gallbladder issues at that time as well.  She does not have family history of thyroid cancer, no history of pancreatitis, she does not smoke.  - Specific targets for  A1c; LDL, HDL, and Triglycerides were discussed with the patient.  2) Blood Pressure /Hypertension:  her blood pressure is controlled to target.   she is advised to continue her current medications including Benicar 40-25 mg po daily.  3) Lipids/Hyperlipidemia:    Review of her recent  lipid panel from 09/14/21 showed controlled LDL at 95 and slightly elevated triglycerides of 185 .  She is not currently on any lipid lowering medications, was on Simvastatin but stopped after recent recall.  4)  Weight/Diet:  her Body mass index is 44.5 kg/m.  -  clearly complicating her diabetes care.   she is a candidate for weight loss. I discussed with her the fact that loss of 5 - 10% of her  current body weight will have the most impact on her diabetes management.  Exercise, and detailed carbohydrates information provided  -  detailed on discharge instructions.  5) Chronic Care/Health Maintenance: -she is on ACEI/ARB and not currently on Statin medications and is encouraged to initiate and continue to follow up with Ophthalmology, Dentist, Podiatrist at least yearly or according to recommendations, and advised to stay away from smoking. I have recommended yearly flu vaccine and pneumonia vaccine at least every 5 years; moderate intensity exercise for up to 150 minutes weekly; and sleep for at least 7 hours a day.  - she is advised to maintain close follow up with Fenton Foy, NP for primary care needs, as well as her other providers for optimal and coordinated care.   - Time spent in this patient care: 60 min, of which > 50% was spent in counseling her about her diabetes and the rest reviewing her blood glucose logs, discussing her hypoglycemia and hyperglycemia episodes, reviewing her current and previous labs/studies (including abstraction from other facilities) and medications doses and developing a long term treatment plan based on the latest standards of care/guidelines; and documenting her care.    Please refer to Patient Instructions for Blood Glucose Monitoring and Insulin/Medications Dosing Guide" in media tab for additional information. Please also refer to "Patient Self Inventory" in the Media tab for reviewed elements of pertinent patient history.  Lori Shea  participated in the discussions, expressed understanding, and voiced agreement with the above plans.  All questions were answered to her satisfaction. she is encouraged to contact clinic should she have any questions or concerns prior to her return visit.     Follow up plan: - Return in about 3 months (around 07/25/2022) for Diabetes F/U with A1c in office, No previsit labs, Bring meter and logs.    Rayetta Pigg, FNP-BC [facility] [address] [place], [place]  [place] Phone: [phone] Fax: [phone]  04/25/2022, 12:46 PM

## 2022-04-27 ENCOUNTER — Other Ambulatory Visit: Payer: Self-pay | Admitting: Nurse Practitioner

## 2022-04-27 ENCOUNTER — Other Ambulatory Visit: Payer: Self-pay

## 2022-04-27 DIAGNOSIS — E1165 Type 2 diabetes mellitus with hyperglycemia: Secondary | ICD-10-CM

## 2022-04-30 MED ORDER — TRESIBA FLEXTOUCH 100 UNIT/ML ~~LOC~~ SOPN: 60.0000 [IU] | PEN_INJECTOR | Freq: Every day | SUBCUTANEOUS | 0 refills | Status: DC

## 2022-04-30 NOTE — Telephone Encounter (Signed)
We can change to whatever her pharmacy prefers.  Looks like they prefer [facility] but they aren't covering it?

## 2022-05-02 ENCOUNTER — Other Ambulatory Visit: Payer: Self-pay

## 2022-05-03 ENCOUNTER — Other Ambulatory Visit: Payer: Self-pay | Admitting: Nurse Practitioner

## 2022-05-11 ENCOUNTER — Encounter: Payer: Self-pay | Admitting: Nurse Practitioner

## 2022-05-14 MED ORDER — RYBELSUS 3 MG PO TABS: 3.0000 mg | ORAL_TABLET | Freq: Every day | ORAL | 0 refills | Status: DC

## 2022-05-16 NOTE — Telephone Encounter (Signed)
Patient will need to follow with endocrinology for diabetic management.

## 2022-05-17 ENCOUNTER — Encounter: Payer: Self-pay | Admitting: Nurse Practitioner

## 2022-05-17 ENCOUNTER — Other Ambulatory Visit (HOSPITAL_COMMUNITY): Payer: Self-pay

## 2022-05-21 ENCOUNTER — Other Ambulatory Visit (HOSPITAL_COMMUNITY): Payer: Self-pay

## 2022-05-21 ENCOUNTER — Telehealth: Payer: Self-pay | Admitting: Pharmacy Technician

## 2022-05-21 NOTE — Telephone Encounter (Signed)
Thanks so much!

## 2022-05-21 NOTE — Telephone Encounter (Signed)
Will you please let the patient know her Rybelsus was approved.  The pharmacy should have it ready soon!

## 2022-05-21 NOTE — Telephone Encounter (Signed)
Pharmacy Patient Advocate Encounter   Received notification from pt advice req msgs/NP that prior authorization for Rybelsus '3mg'$  is required/requested.  PA submitted on 05/21/22 to (ins) [employer] [place] [employer] via [employer]  Prior Authorization has been approved.    Effective dates: 05/21/22 through 05/20/23

## 2022-05-21 NOTE — Telephone Encounter (Signed)
Patient was called and a message was left , letting her know that her medication has been approved and should be ready soon.

## 2022-05-25 ENCOUNTER — Other Ambulatory Visit (HOSPITAL_COMMUNITY): Payer: Self-pay

## 2022-05-28 ENCOUNTER — Other Ambulatory Visit (HOSPITAL_COMMUNITY): Payer: Self-pay

## 2022-05-28 ENCOUNTER — Telehealth: Payer: Self-pay | Admitting: Pharmacy Technician

## 2022-05-28 ENCOUNTER — Other Ambulatory Visit: Payer: Self-pay | Admitting: Nurse Practitioner

## 2022-05-28 ENCOUNTER — Telehealth: Payer: Self-pay | Admitting: Gastroenterology

## 2022-05-28 NOTE — Telephone Encounter (Signed)
Patient Advocate Encounter  Received notification from [facility] that prior authorization for ESOMEPRAZOLE '20MG'$  is required.   PA submitted on 2.5.24 Key [id number] Status is pending

## 2022-05-28 NOTE — Telephone Encounter (Signed)
Patient's pharmacy is calling checking up on a PA for Questran Rx. Please advise

## 2022-05-29 ENCOUNTER — Telehealth: Payer: Self-pay | Admitting: *Deleted

## 2022-05-29 ENCOUNTER — Other Ambulatory Visit (HOSPITAL_COMMUNITY): Payer: Self-pay

## 2022-05-29 NOTE — Telephone Encounter (Signed)
We can change this to the Novolog injecting 10 -16 units three times dally, Dispense 6 mL with 1 refill. We thank you greatly!

## 2022-05-29 NOTE — Telephone Encounter (Signed)
We have rec'd two calls from the patient's pharmacy, [facility] [phone]. They share that they have sent to [place] and to Cover My Meds PA requests for this patient.. It is for her Dexcom Sensors and Humalog Quick Pen. The last call the young lady provided the patient's new insurance information as follows. Advance insurance [id number] PCN ADV ID number [id number] Group number [place] office staff say that this was sent into the PA team last week. May we get a follow up on this for the patient, as she states that she is out of her medication.

## 2022-05-29 NOTE — Telephone Encounter (Signed)
The pt has been advised and the pharmacy has ordered the medication.

## 2022-05-29 NOTE — Telephone Encounter (Signed)
Patient left a messages stating that her blood sugars have been very high. The ranges are 200-300,300-400. On Saturday her blood sugar was in the 400's  and she states that between 8 am - 1 am on Sunday mornig she administered 65 units of Novolog, she reports that her blood sugars did not go down at all.  She started the Rybelsus on Sunday, so she has taken this medication since Sunday. Her Blood Sugar this morning was 229. While talking with me at 1:05 pm she checked it and it was 205. She has had no insulin today, as her sugars have been so high. Today she has had a protein shake , no real meal so far today.  Nithila states that her menu is  a protein shake in the morning , 1 protein and 2 veggies , for her dinner she has a salad , 1 veggie and 1 protein.  She just came off a cruise, and says that some of the people on the cruise have come back with the Covid , she was having trouble breathing, so she took a Covid test and it was negative,  Patient has also ran out of [place]. She had not increased her dose to 70 units yet, she has remained at 60 units. We have received PA request for the Dexcom sensor,Humalog Quick Pen last week, Maudie Mercury states that she sent those to the PA team. We have not heard back from this as of yet.  I will send them a message on this , meanwhile we have a Tresiba Pen and per Whitney I may give to the patient , she ask that she start injecting the 70 units , Patient was called and made aware.

## 2022-05-30 ENCOUNTER — Other Ambulatory Visit: Payer: Self-pay | Admitting: Gastroenterology

## 2022-05-30 MED ORDER — NOVOLOG FLEXPEN 100 UNIT/ML ~~LOC~~ SOPN: 10.0000 [IU] | PEN_INJECTOR | Freq: Three times a day (TID) | SUBCUTANEOUS | 1 refills | Status: DC

## 2022-05-30 NOTE — Telephone Encounter (Signed)
Yes, I just sent it in for her.

## 2022-05-30 NOTE — Telephone Encounter (Signed)
Patient was called and made aware. 

## 2022-06-06 NOTE — Telephone Encounter (Signed)
Patient Advocate Encounter  Prior Authorization for ESOMEPRAZOLE 20MG has been approved.    PA# [id number] Effective dates: 2.7.24 through 2.7.25

## 2022-06-11 ENCOUNTER — Encounter: Payer: Self-pay | Admitting: Nurse Practitioner

## 2022-06-11 NOTE — Telephone Encounter (Signed)
Have you received a PA request for her Dexcom G7 sensors?

## 2022-06-14 ENCOUNTER — Encounter: Payer: Self-pay | Admitting: Obstetrics and Gynecology

## 2022-06-18 ENCOUNTER — Other Ambulatory Visit: Payer: Self-pay | Admitting: Obstetrics and Gynecology

## 2022-06-18 DIAGNOSIS — F419 Anxiety disorder, unspecified: Secondary | ICD-10-CM

## 2022-06-20 ENCOUNTER — Telehealth: Payer: 59 | Admitting: Nurse Practitioner

## 2022-06-20 DIAGNOSIS — U071 COVID-19: Secondary | ICD-10-CM

## 2022-06-20 MED ORDER — MOLNUPIRAVIR EUA 200MG CAPSULE: 4.0000 | ORAL_CAPSULE | Freq: Two times a day (BID) | ORAL | 0 refills | Status: AC

## 2022-06-20 NOTE — Patient Instructions (Signed)
[name], thank you for joining Lori Pretty, FNP for today's virtual visit.  While this provider is not your primary care provider (PCP), if your PCP is located in our provider database this encounter information will be shared with them immediately following your visit.   [facility] account gives you access to today's visit and all your visits, tests, and labs performed at [facility] " click here if you don't have a [place] account or go to [email or web address]  Consent: (Patient) Lori Shea provided verbal consent for this virtual visit at the beginning of the encounter.  Current Medications:  Current Outpatient Medications:    molnupiravir EUA (LAGEVRIO) 200 mg CAPS capsule, Take 4 capsules (800 mg total) by mouth 2 (two) times daily for 5 days., Disp: 40 capsule, Rfl: 0   albuterol (PROVENTIL) (2.5 MG/3ML) 0.083% nebulizer solution, Take 3 mLs (2.5 mg total) by nebulization every 6 (six) hours as needed for wheezing or shortness of breath., Disp: 75 mL, Rfl: 6   albuterol (VENTOLIN HFA) 108 (90 Base) MCG/ACT inhaler, INHALE 2 PUFFS INTO THE LUNGS EVERY 4 HOURS AS NEEDED FOR FOR WHEEZING OR SHORTNESS OF BREATH, Disp: 8.5 g, Rfl: 2   amitriptyline (ELAVIL) 25 MG tablet, TAKE 1 TABLET BY MOUTH NIGHTLY AT BEDTIME, Disp: 30 tablet, Rfl: 11   cetirizine (ZYRTEC) 10 MG tablet, Take 1 tablet (10 mg total) by mouth daily. (Patient taking differently: Take 10 mg by mouth at bedtime.), Disp: 30 tablet, Rfl: 11   cholestyramine (QUESTRAN) 4 g packet, take 1 PACKET BY MOUTH AS NEEDED FOR DIARRHEA. Not TO be taken WITHIN 4 hours OF any other medication, Disp: 60 packet, Rfl: 0   Continuous Blood Gluc Sensor (DEXCOM G7 SENSOR) MISC, Inject 1 Application into the skin as directed. Change sensor every 10 days as directed., Disp: 6 each, Rfl: 3   cyclobenzaprine (FLEXERIL) 5 MG tablet, Take 1 tablet (5 mg total) by mouth 3 (three) times daily as  needed for muscle spasms., Disp: 10 tablet, Rfl: 0   EASY COMFORT PEN NEEDLES 32G X 4 MM MISC, use as directed test 2 times daily, Disp: 100 each, Rfl: 3   fluconazole (DIFLUCAN) 150 MG tablet, Take 1 tablet (150 mg total) by mouth every 3 (three) days., Disp: 4 tablet, Rfl: 0   fluticasone-salmeterol (ADVAIR DISKUS) 100-50 MCG/ACT AEPB, Inhale 1 puff into the lungs 2 (two) times daily., Disp: 60 each, Rfl: 11   ibuprofen (ADVIL) 600 MG tablet, Take 1 tablet (600 mg total) by mouth every 6 (six) hours as needed., Disp: 30 tablet, Rfl: 0   insulin aspart (NOVOLOG FLEXPEN) 100 UNIT/ML FlexPen, Inject 10-16 Units into the skin 3 (three) times daily with meals., Disp: 15 mL, Rfl: 1   Insulin Pen Needle (PEN NEEDLES) 32G X 5 MM MISC, 100 each by Does not apply route 2 (two) times daily., Disp: 100 each, Rfl: 3   meloxicam (MOBIC) 15 MG tablet, Take 1 tablet (15 mg total) by mouth daily. (Patient taking differently: Take 15 mg by mouth as needed.), Disp: 30 tablet, Rfl: 11   metFORMIN (GLUCOPHAGE) 500 MG tablet, Take 1 tablet (500 mg total) by mouth 2 (two) times daily with a meal., Disp: 60 tablet, Rfl: 11   montelukast (SINGULAIR) 10 MG tablet, Take 1 tablet (10 mg total) by mouth daily., Disp: 30 tablet, Rfl: 11   olmesartan-hydrochlorothiazide (BENICAR HCT) 40-25 MG tablet, Take 1 tablet by mouth daily., Disp: 90 tablet, Rfl: 3  oxyCODONE-acetaminophen (PERCOCET/ROXICET) 5-325 MG tablet, Take 1 tablet by mouth every 6 (six) hours as needed., Disp: 10 tablet, Rfl: 0   pantoprazole (PROTONIX) 40 MG tablet, Take 1 tablet (40 mg total) by mouth daily., Disp: 90 tablet, Rfl: 0   promethazine (PHENERGAN) 25 MG tablet, Take 1 tablet (25 mg total) by mouth every 8 (eight) hours as needed., Disp: 30 tablet, Rfl: 6   rizatriptan (MAXALT-MLT) 10 MG disintegrating tablet, Dissolve 1 tablet in mouth as needed for migraine. May repeat in 2 hours if needed, Disp: 12 tablet, Rfl: 0   Semaglutide (RYBELSUS) 3 MG TABS,  Take 1 tablet (3 mg total) by mouth daily., Disp: 30 tablet, Rfl: 0   TRESIBA FLEXTOUCH 100 UNIT/ML FlexTouch Pen, INJECT 60 UNITS into THE SKIN AT BEDTIME, Disp: 60 mL, Rfl: 0   Medications ordered in this encounter:  Meds ordered this encounter  Medications   molnupiravir EUA (LAGEVRIO) 200 mg CAPS capsule    Sig: Take 4 capsules (800 mg total) by mouth 2 (two) times daily for 5 days.    Dispense:  40 capsule    Refill:  0    Order Specific Question:   Supervising Provider    Answer:   Chase Picket [id number]     *If you need refills on other medications prior to your next appointment, please contact your pharmacy*  Follow-Up: Call back or seek an in-person evaluation if the symptoms worsen or if the condition fails to improve as anticipated.  [place] [phone]  Other Instructions 1. Take meds as prescribed 2. Use a cool mist humidifier especially during the winter months and when heat has been humid. 3. Use saline nose sprays frequently 4. Saline irrigations of the nose can be very helpful if done frequently.  * 4X daily for 1 week*  * Use of a nettie pot can be helpful with this. Follow directions with this* 5. Drink plenty of fluids 6. Keep thermostat turn down low 7.For any cough or congestion- mucinex or delsym OTC 8. For fever or aces or pains- take tylenol or ibuprofen appropriate for age and weight.  * for fevers greater than 101 orally you may alternate ibuprofen and tylenol every  3 hours.      If you have been instructed to have an in-person evaluation today at a local Urgent Care facility, please use the link below. It will take you to a list of all of our available [facility], including address, phone number and hours of operation. Please do not delay care.  [facility]  If you or a family member do not have a primary care provider, use the link below to schedule a visit and establish care. When you choose a  [facility] primary care physician or advanced practice provider, you gain a long-term partner in health. Find a Primary Care Provider  Learn more about [facility]'s in-office and virtual care options: [facility] Now

## 2022-06-20 NOTE — Progress Notes (Signed)
Virtual Visit Consent   Lori Shea, you are scheduled for a virtual visit with Lori Shea, [place], a [facility] provider, today.     Just as with appointments in the office, your consent must be obtained to participate.  Your consent will be active for this visit and any virtual visit you may have with one of our providers in the next 365 days.     If you have a MyChart account, a copy of this consent can be sent to you electronically.  All virtual visits are billed to your insurance company just like a traditional visit in the office.    As this is a virtual visit, video technology does not allow for your provider to perform a traditional examination.  This may limit your provider's ability to fully assess your condition.  If your provider identifies any concerns that need to be evaluated in person or the need to arrange testing (such as labs, EKG, etc.), we will make arrangements to do so.     Although advances in technology are sophisticated, we cannot ensure that it will always work on either your end or our end.  If the connection with a video visit is poor, the visit may have to be switched to a telephone visit.  With either a video or telephone visit, we are not always able to ensure that we have a secure connection.     I need to obtain your verbal consent now.   Are you willing to proceed with your visit today? YES   Lori Shea has provided verbal consent on 06/20/2022 for a virtual visit (video or telephone).   Lori Hassell Done, FNP   Date: 06/20/2022 1:04 PM   Virtual Visit via Video Note   I, Lori Shea, connected with Lori Shea ([id number], 47) on 06/20/22 at  1:15 PM EST by a video-enabled telemedicine application and verified that I am speaking with the correct person using two identifiers.  Location: Patient: Virtual Visit Location Patient: Home Provider: Virtual Visit Location Provider: Mobile   I discussed the  limitations of evaluation and management by telemedicine and the availability of in person appointments. The patient expressed understanding and agreed to proceed.    History of Present Illness: Lori Shea is a 47 y.o. who identifies as a female who was assigned female at birth, and is being seen today for covid positive.  HPI: URI  This is a new problem. The current episode started yesterday. The maximum temperature recorded prior to her arrival was 101 - 101.9 F. Associated symptoms include congestion, headaches and rhinorrhea. She has tried decongestant and acetaminophen for the symptoms.    Review of Systems  HENT:  Positive for congestion and rhinorrhea.   Neurological:  Positive for headaches.    Problems:  Patient Active Problem List   Diagnosis Date Noted   Chronic diarrhea 01/25/2022   History of colonic polyps 01/25/2022   Gastroesophageal reflux disease 01/25/2022   Chronic nausea 01/25/2022   Gastritis without bleeding 01/25/2022   Stress incontinence of urine 01/02/2021   Skin irritation 01/02/2021   Hepatic steatosis 12/01/2019   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/2019   Hemoglobin A1C greater than 9%, indicating poor diabetic control 03/23/2019   Hyperglycemia 03/23/2019   Class 3 severe obesity due to excess calories with serious comorbidity and body mass index (BMI) of 40.0 to 44.9 in adult [facility]) 03/23/2019   Yeast infection 03/23/2019   BMI 40.0-44.9, adult ([place]) 03/09/2019  Cervical dysplasia 07/31/2017   Alopecia of scalp 05/09/2017   Pain in left toe(s) 12/27/2016   Morbid obesity ([place]) 09/13/2016   Elevated liver enzymes 09/13/2016   Allergy    Asthma    Hypertension 04/23/2013   Irregular periods 02/22/2011   Type 2 diabetes mellitus with hyperglycemia, with long-term current use of insulin ([place]) 04/23/2010   PCOS (polycystic ovarian syndrome) 04/23/2008   Migraines 04/24/2007    Allergies:  Allergies  Allergen Reactions    Corn-Containing Products Hives    "really bad asthma attacks"   Honey Shortness Of Breath   Mango Flavor Hives and Swelling    Pt allergic to all mango - tongue and throat swelling   Glipizide     Other reaction(s): Other bad yeast infection   Liraglutide Nausea And Vomiting    victoza   Medications:  Current Outpatient Medications:    albuterol (PROVENTIL) (2.5 MG/3ML) 0.083% nebulizer solution, Take 3 mLs (2.5 mg total) by nebulization every 6 (six) hours as needed for wheezing or shortness of breath., Disp: 75 mL, Rfl: 6   albuterol (VENTOLIN HFA) 108 (90 Base) MCG/ACT inhaler, INHALE 2 PUFFS INTO THE LUNGS EVERY 4 HOURS AS NEEDED FOR FOR WHEEZING OR SHORTNESS OF BREATH, Disp: 8.5 g, Rfl: 2   amitriptyline (ELAVIL) 25 MG tablet, TAKE 1 TABLET BY MOUTH NIGHTLY AT BEDTIME, Disp: 30 tablet, Rfl: 11   cetirizine (ZYRTEC) 10 MG tablet, Take 1 tablet (10 mg total) by mouth daily. (Patient taking differently: Take 10 mg by mouth at bedtime.), Disp: 30 tablet, Rfl: 11   cholestyramine (QUESTRAN) 4 g packet, take 1 PACKET BY MOUTH AS NEEDED FOR DIARRHEA. Not TO be taken WITHIN 4 hours OF any other medication, Disp: 60 packet, Rfl: 0   Continuous Blood Gluc Sensor (DEXCOM G7 SENSOR) MISC, Inject 1 Application into the skin as directed. Change sensor every 10 days as directed., Disp: 6 each, Rfl: 3   cyclobenzaprine (FLEXERIL) 5 MG tablet, Take 1 tablet (5 mg total) by mouth 3 (three) times daily as needed for muscle spasms., Disp: 10 tablet, Rfl: 0   EASY COMFORT PEN NEEDLES 32G X 4 MM MISC, use as directed test 2 times daily, Disp: 100 each, Rfl: 3   fluconazole (DIFLUCAN) 150 MG tablet, Take 1 tablet (150 mg total) by mouth every 3 (three) days., Disp: 4 tablet, Rfl: 0   fluticasone-salmeterol (ADVAIR DISKUS) 100-50 MCG/ACT AEPB, Inhale 1 puff into the lungs 2 (two) times daily., Disp: 60 each, Rfl: 11   ibuprofen (ADVIL) 600 MG tablet, Take 1 tablet (600 mg total) by mouth every 6 (six) hours as  needed., Disp: 30 tablet, Rfl: 0   insulin aspart (NOVOLOG FLEXPEN) 100 UNIT/ML FlexPen, Inject 10-16 Units into the skin 3 (three) times daily with meals., Disp: 15 mL, Rfl: 1   Insulin Pen Needle (PEN NEEDLES) 32G X 5 MM MISC, 100 each by Does not apply route 2 (two) times daily., Disp: 100 each, Rfl: 3   meloxicam (MOBIC) 15 MG tablet, Take 1 tablet (15 mg total) by mouth daily. (Patient taking differently: Take 15 mg by mouth as needed.), Disp: 30 tablet, Rfl: 11   metFORMIN (GLUCOPHAGE) 500 MG tablet, Take 1 tablet (500 mg total) by mouth 2 (two) times daily with a meal., Disp: 60 tablet, Rfl: 11   montelukast (SINGULAIR) 10 MG tablet, Take 1 tablet (10 mg total) by mouth daily., Disp: 30 tablet, Rfl: 11   olmesartan-hydrochlorothiazide (BENICAR HCT) 40-25 MG tablet, Take 1  tablet by mouth daily., Disp: 90 tablet, Rfl: 3   oxyCODONE-acetaminophen (PERCOCET/ROXICET) 5-325 MG tablet, Take 1 tablet by mouth every 6 (six) hours as needed., Disp: 10 tablet, Rfl: 0   pantoprazole (PROTONIX) 40 MG tablet, Take 1 tablet (40 mg total) by mouth daily., Disp: 90 tablet, Rfl: 0   promethazine (PHENERGAN) 25 MG tablet, Take 1 tablet (25 mg total) by mouth every 8 (eight) hours as needed., Disp: 30 tablet, Rfl: 6   rizatriptan (MAXALT-MLT) 10 MG disintegrating tablet, Dissolve 1 tablet in mouth as needed for migraine. May repeat in 2 hours if needed, Disp: 12 tablet, Rfl: 0   Semaglutide (RYBELSUS) 3 MG TABS, Take 1 tablet (3 mg total) by mouth daily., Disp: 30 tablet, Rfl: 0   TRESIBA FLEXTOUCH 100 UNIT/ML FlexTouch Pen, INJECT 60 UNITS into THE SKIN AT BEDTIME, Disp: 60 mL, Rfl: 0  Observations/Objective: Patient is well-developed, well-nourished in no acute distress.  Resting comfortably  at home.  Head is normocephalic, atraumatic.  No labored breathing.  Speech is clear and coherent with logical content.  Patient is alert and oriented at baseline.  Wet cough Raspy voice  Assessment and  Plan:  Lori Shea in today with chief complaint of Covid Positive   1. Positive self-administered antigen test for COVID-19 1. Take meds as prescribed 2. Use a cool mist humidifier especially during the winter months and when heat has been humid. 3. Use saline nose sprays frequently 4. Saline irrigations of the nose can be very helpful if Shea frequently.  * 4X daily for 1 week*  * Use of a nettie pot can be helpful with this. Follow directions with this* 5. Drink plenty of fluids 6. Keep thermostat turn down low 7.For any cough or congestion- mucinex or delsym OTC 8. For fever or aces or pains- take tylenol or ibuprofen appropriate for age and weight.  * for fevers greater than 101 orally you may alternate ibuprofen and tylenol every  3 hours.    Meds ordered this encounter  Medications   molnupiravir EUA (LAGEVRIO) 200 mg CAPS capsule    Sig: Take 4 capsules (800 mg total) by mouth 2 (two) times daily for 5 days.    Dispense:  40 capsule    Refill:  0    Order Specific Question:   Supervising Provider    Answer:   Chase Picket [id number]     Follow Up Instructions: I discussed the assessment and treatment plan with the patient. The patient was provided an opportunity to ask questions and all were answered. The patient agreed with the plan and demonstrated an understanding of the instructions.  A copy of instructions were sent to the patient via MyChart.  The patient was advised to call back or seek an in-person evaluation if the symptoms worsen or if the condition fails to improve as anticipated.  Time:  I spent 10 minutes with the patient via telehealth technology discussing the above problems/concerns.    Lori Hassell Done, FNP

## 2022-06-21 ENCOUNTER — Other Ambulatory Visit: Payer: Self-pay | Admitting: Nurse Practitioner

## 2022-06-22 ENCOUNTER — Telehealth: Payer: Self-pay | Admitting: *Deleted

## 2022-06-22 MED ORDER — RYBELSUS 7 MG PO TABS: 7.0000 mg | ORAL_TABLET | Freq: Every day | ORAL | 3 refills | Status: DC

## 2022-06-22 NOTE — Telephone Encounter (Signed)
Talked with the patient. She has been without her Dexcom G7 for over 1 week. Patient advised that we would do a follow up with our PA team regarding the status of this PA request.

## 2022-06-25 ENCOUNTER — Telehealth: Payer: Self-pay | Admitting: Pharmacy Technician

## 2022-06-25 ENCOUNTER — Encounter: Payer: Self-pay | Admitting: Nurse Practitioner

## 2022-06-25 ENCOUNTER — Other Ambulatory Visit (HOSPITAL_COMMUNITY): Payer: Self-pay

## 2022-06-25 NOTE — Telephone Encounter (Signed)
Pharmacy Patient Advocate Encounter   Received notification from Pt calls msgs/LPN that prior authorization for Dexcom G7 Sensors is required/requested.   PA submitted on 06/25/22 to (ins) [employer] via [employer] or [facility]) confirmation # [id number] Status is pending

## 2022-06-26 MED ORDER — FREESTYLE LIBRE 3 READER DEVI: 1.0000 | Freq: Once | 0 refills | Status: AC

## 2022-06-26 MED ORDER — FREESTYLE LIBRE 3 SENSOR MISC: 3 refills | Status: DC

## 2022-06-26 NOTE — Telephone Encounter (Signed)
I can send in for [place] 3 to see if we can get her approved for that one.

## 2022-06-26 NOTE — Telephone Encounter (Signed)
Does she have to have another PA done for dosage change of the Rybelsus.  Patient is telling me that pharmacy is telling her that.

## 2022-06-26 NOTE — Telephone Encounter (Signed)
Patient was called and made aware. 

## 2022-06-26 NOTE — Telephone Encounter (Signed)
Pharmacy Patient Advocate Encounter  Received notification from [place] [facility] that the request for prior authorization for Dexcom G7 has been denied due to not meeting medical necessity.    Full denial letter saved to pt's chart.  Can she use the Freestyle [place]?

## 2022-06-26 NOTE — Telephone Encounter (Signed)
Rybelsus previously approved on 05/21/2022

## 2022-06-26 NOTE — Telephone Encounter (Signed)
Was the request sent for both the Rybelsus and the Dexcom?

## 2022-06-27 ENCOUNTER — Telehealth: Payer: Self-pay

## 2022-06-27 ENCOUNTER — Other Ambulatory Visit (HOSPITAL_COMMUNITY): Payer: Self-pay

## 2022-06-27 NOTE — Telephone Encounter (Signed)
PA for '7mg'$  submitted and is pending determination. Will update in additional encounter created.   Key: [id number]

## 2022-06-27 NOTE — Telephone Encounter (Signed)
PA request received via CMM/Provider for Rybelsus '7MG'$  tablets  PA has been submitted to [place] and is pending determination.  Key: [id number]

## 2022-06-28 ENCOUNTER — Other Ambulatory Visit (HOSPITAL_COMMUNITY): Payer: Self-pay

## 2022-06-28 ENCOUNTER — Telehealth: Payer: Self-pay | Admitting: Pharmacy Technician

## 2022-06-28 NOTE — Telephone Encounter (Signed)
Pharmacy Patient Advocate Encounter   Received notification from msgs that prior authorization for [facility] 3 is required/requested. Preferred over Dexcom (denied)   PA submitted on 06/28/22 to (ins) [employer] via [employer] or [facility]) confirmation #  [id number]  PA Case ID: [id number] (sensors) Status is pending

## 2022-06-29 NOTE — Telephone Encounter (Signed)
Patient Advocate Encounter  Prior Authorization for Rybelsus '7MG'$  tablets has been approved through [employer].  [id number]    Effective: 06-28-2022 to 06-28-2023

## 2022-07-02 NOTE — Telephone Encounter (Signed)
Can you please let Juliann Pulse know her 7 mg tabs were approved?  Thanks

## 2022-07-02 NOTE — Telephone Encounter (Signed)
Patient was called and a voice message was left on her cell phone, that the medication was approved.

## 2022-07-02 NOTE — Telephone Encounter (Signed)
Ok, thanks.

## 2022-07-02 NOTE — Telephone Encounter (Signed)
Pharmacy Patient Advocate Encounter  Received notification from [place] that the request for prior authorization for Freestyle [place] 3 has been denied. I answered yes to the appropriate questions, but I'm guessing they didn't see notes to support it. Below is the denial reason. Will also save to her chart.

## 2022-07-02 NOTE — Telephone Encounter (Signed)
Can we please appeal?  And attach my consultation note and highlight the portion where she is on multiple daily injections of insulin.  I think they must've missed that part.

## 2022-07-03 ENCOUNTER — Encounter: Payer: Self-pay | Admitting: Nurse Practitioner

## 2022-07-09 ENCOUNTER — Other Ambulatory Visit (HOSPITAL_COMMUNITY): Payer: Self-pay

## 2022-07-09 NOTE — Telephone Encounter (Signed)
Appeal requested via fax to [phone] Appeal letter, notes and record of conversation with pt sent.

## 2022-07-10 ENCOUNTER — Other Ambulatory Visit: Payer: Self-pay | Admitting: Nurse Practitioner

## 2022-07-11 ENCOUNTER — Ambulatory Visit: Payer: Self-pay | Admitting: Clinical

## 2022-07-11 DIAGNOSIS — Z658 Other specified problems related to psychosocial circumstances: Secondary | ICD-10-CM

## 2022-07-11 NOTE — BH Specialist Note (Signed)
Integrated Behavioral Health via Telemedicine Visit  07/11/2022 SHAMICKA DWORSKY [id number]  I reviewed patient visit with the [facility] Intern, and I concur with the treatment plan, as documented in the [facility] Intern note.   No charge for this visit due to [facility] Intern seeing patient.  Vesta Mixer, MSW, [place] for [facility] at 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 Discussed confidentiality: Yes   I discussed the limitations of telemedicine and the availability of in person appointments.  Discussed there is a possibility of technology failure and discussed alternative modes of communication if that failure occurs.  I discussed that engaging in this telemedicine visit, they consent to the provision of behavioral healthcare and the services will be billed under their insurance.  Patient and/or legal guardian expressed understanding and consented to Telemedicine visit: Yes   Presenting Concerns: Patient and/or family reports the following symptoms/concerns: Pt concerned about recent changes in her mood  Duration of problem: ongoing ; Severity of problem: moderate  Patient and/or Family's Strengths/Protective Factors: Social connections, Social and Emotional competence, Concrete supports in place (healthy food, safe environments, etc.), Sense of  purpose, and Physical Health (exercise, healthy diet, medication compliance, etc.)  Goals Addressed: Patient will:  Reduce symptoms of: agitation, anxiety, mood instability, and stress   Increase knowledge and/or ability of: coping skills and stress reduction   Demonstrate ability to: Increase healthy adjustment to current life circumstances and Increase adequate support systems for patient/family  Progress towards Goals: Ongoing  Interventions: Interventions utilized:  Supportive Counseling, Psychoeducation and/or Health Education, and Link to [employer] Standardized Assessments completed: GAD-7 and PHQ 9    07/11/2022    9:43 AM 10/02/2021    9:38 AM 09/13/2021    1:44 PM 01/04/2021    8:50 AM  GAD 7 : Generalized Anxiety Score  Nervous, Anxious, on Edge 3 0 0 3  Control/stop worrying 2 0 0 3  Worry too much - different things 2 0 0 3  Trouble relaxing 2 0 0 1  Restless 1 0 0 1  Easily annoyed or irritable 3 0 0 3  Afraid - awful might happen 2 0 0 2  Total GAD 7 Score 15 0 0 16  Anxiety Difficulty Very difficult Not difficult at all          07/11/2022    9:43 AM 10/02/2021    9:38 AM 09/13/2021    1:43 PM  PHQ9 SCORE ONLY  PHQ-9 Total Score 6 0 0     Patient and/or Family Response: Pt agrees with treatment plan   Assessment: Patient currently experiencing increased anxiety and agitation   Patient may benefit from Medication Management .  Plan: Follow up with behavioral health clinician on : April 4th, @ 4:15 PM Behavioral recommendations: -Continue prioritizing healthy self-care (regular meals, adequate rest; allowing practical help from supportive friends and family) until at least  postpartum medical appointment -Consider new mom support group as needed at either [email or web address] or [email or web address]   Referral(s): Integrated [employer] (In Clinic) and [facility]  [address] [place], [place] [address] [phone] or [phone] [facility] [place] ANYONE [address] [place], [place] Fax: [phone] [email or web address] *Interpreters available *Accepts all insurance and uninsured for Urgent Care needs *Accepts Medicaid and uninsured for outpatient treatment (below)    ONLY FOR [facility]  Below:   Outpatient New Patient Assessment/Therapy Walk-ins:        Monday -Thursday 8am until slots are full.        Every Friday 1pm-4pm  (first come, first served)                   New Patient Psychiatry/Medication Management        Monday-Friday 8am-11am (first come, first served)              For all walk-ins we ask that you arrive by 7:15am, because patients will be seen in the order of arrival.   I discussed the assessment and treatment plan with the patient and/or parent/guardian. They were provided an opportunity to ask questions and all were answered. They agreed with the plan and demonstrated an understanding of the instructions.   They were advised to call back or seek an in-person evaluation if the symptoms worsen or if the condition fails to improve as anticipated.  Alba Destine

## 2022-07-25 ENCOUNTER — Encounter: Payer: Self-pay | Admitting: Nurse Practitioner

## 2022-07-25 ENCOUNTER — Ambulatory Visit: Payer: 59 | Admitting: Nurse Practitioner

## 2022-07-25 VITALS — BP 119/80 | HR 84 | Ht 65.0 in | Wt 274.4 lb

## 2022-07-25 DIAGNOSIS — E1165 Type 2 diabetes mellitus with hyperglycemia: Secondary | ICD-10-CM | POA: Diagnosis not present

## 2022-07-25 DIAGNOSIS — Z794 Long term (current) use of insulin: Secondary | ICD-10-CM

## 2022-07-25 MED ORDER — FREESTYLE LIBRE 3 SENSOR MISC: 3 refills | Status: DC

## 2022-07-25 NOTE — Patient Instructions (Signed)

## 2022-07-25 NOTE — Progress Notes (Signed)
Endocrinology Follow Up Note       07/25/2022, 4:49 PM   Subjective:    Patient ID: Lori Shea, female    DOB: [birth year].  Lori Shea is being seen in follow up after being seen in consultation for management of currently uncontrolled symptomatic diabetes requested by  Fenton Foy, NP.   Past Medical History:  Diagnosis Date   Allergy    Foods:  mango, honey, corn.  Allergic to "everything tested"  15 years ago in [place]   Anemia    with pregnancy   Anxiety    Arthritis    Shoulder and hips   Asthma age 33 yo   Triggers:  cold weather, exercise, allergens, anxiety, URIs   Cholelithiasis age 12 yo   Asymptomatic   Closed avulsion fracture of lateral malleolus of right fibula 12/27/2016   COVID-19    Diabetes mellitus type 2 2012   Was diagnosed 2 years earlier with PCOS   Elevated liver enzymes 09/13/2016   none since 2018   GERD (gastroesophageal reflux disease)    Heart murmur    with pregnancy 20 yrs ago per pt on 10-12-2021   History of kidney stones    Hypertension 2015   Kidney stones    family history of renal failure   Migraines 2009   Morbid obesity    PCOS (polycystic ovarian syndrome) 2010   Pneumonia    several years ago   Wears glasses     Past Surgical History:  Procedure Laterality Date   CHOLECYSTECTOMY N/A 12/01/2019   Procedure: LAPAROSCOPIC CHOLECYSTECTOMY;  Surgeon: Armandina Gemma, MD;  Location: [facility];  Service: General;  Laterality: N/A;   colonscopy and endoscopy  05/25/2021   COLPOSCOPY N/A 2021   CYST EXCISION     DILITATION & CURRETTAGE/HYSTROSCOPY WITH NOVASURE ABLATION N/A 10/18/2021   Procedure: DILATATION & CURETTAGE/HYSTEROSCOPY WITH NOVASURE ABLATION;  Surgeon: Aletha Halim, MD;  Location: [place];  Service: Gynecology;  Laterality: N/A;   LAPAROSCOPIC TUBAL LIGATION Bilateral 01/28/2019   Procedure: LAPAROSCOPIC TUBAL  LIGATION AND PAP SMEAR;  Surgeon: Aletha Halim, MD;  Location: [place];  Service: Gynecology;  Laterality: Bilateral;   TONSILLECTOMY AND ADENOIDECTOMY Bilateral 12/23/2018   Procedure: TONSILLECTOMY AND ADENOIDECTOMY;  Surgeon: Leta Baptist, MD;  Location: [place];  Service: ENT;  Laterality: Bilateral;    Social History   Socioeconomic History   Marital status: Divorced    Spouse name: Not on file   Number of children: 1   Years of education: some comm. college   Highest education level: Not on file  Occupational History   Occupation: CNA    Comment: [employer]  Tobacco Use   Smoking status: Never   Smokeless tobacco: Never  Vaping Use   Vaping Use: Never used  Substance and Sexual Activity   Alcohol use: No   Drug use: No   Sexual activity: Not Currently    Birth control/protection: Surgical    Comment: Sprintec.  Tubal ligation  Other Topics Concern   Not on file  Social History Narrative   Originally from [place], [place] to [place] in  [place] in [place] at home with daughter on [place].   Social Determinants of Health   Financial Resource Strain: Not on file  Food Insecurity: No Food Insecurity (10/02/2021)   Hunger Vital Sign    Worried About Running Out of Food in the Last Year: Never true    Ran Out of Food in the Last Year: Never true  Transportation Needs: No Transportation Needs (10/02/2021)   PRAPARE - Hydrologist (Medical): No    Lack of Transportation (Non-Medical): No  Physical Activity: Not on file  Stress: Not on file  Social Connections: Not on file    Family History  Problem Relation Age of Onset   Healthy Mother    Diabetes Father    Hyperlipidemia Father    Hypertension Father    Heart disease Father    Kidney disease Father        Developed after 2nd CABG   Colon polyps Father    Diabetes Paternal Grandfather    Heart  disease Paternal Grandfather    Hyperlipidemia Paternal Grandfather    Hypertension Paternal Grandfather    Kidney disease Paternal Grandfather     Outpatient Encounter Medications as of 07/25/2022  Medication Sig   albuterol (PROVENTIL) (2.5 MG/3ML) 0.083% nebulizer solution Take 3 mLs (2.5 mg total) by nebulization every 6 (six) hours as needed for wheezing or shortness of breath.   albuterol (VENTOLIN HFA) 108 (90 Base) MCG/ACT inhaler INHALE 2 PUFFS INTO THE LUNGS EVERY 4 HOURS AS NEEDED FOR FOR WHEEZING OR SHORTNESS OF BREATH   amitriptyline (ELAVIL) 25 MG tablet TAKE 1 TABLET BY MOUTH NIGHTLY AT BEDTIME   cetirizine (ZYRTEC) 10 MG tablet Take 1 tablet (10 mg total) by mouth daily. (Patient taking differently: Take 10 mg by mouth at bedtime.)   cholestyramine (QUESTRAN) 4 g packet take 1 PACKET BY MOUTH AS NEEDED FOR DIARRHEA. Not TO be taken WITHIN 4 hours OF any other medication   cyclobenzaprine (FLEXERIL) 5 MG tablet Take 1 tablet (5 mg total) by mouth 3 (three) times daily as needed for muscle spasms.   EASY COMFORT PEN NEEDLES 32G X 4 MM MISC use as directed test 2 times daily   fluticasone-salmeterol (ADVAIR DISKUS) 100-50 MCG/ACT AEPB Inhale 1 puff into the lungs 2 (two) times daily.   ibuprofen (ADVIL) 600 MG tablet Take 1 tablet (600 mg total) by mouth every 6 (six) hours as needed.   Insulin Pen Needle (PEN NEEDLES) 32G X 5 MM MISC 100 each by Does not apply route 2 (two) times daily.   meloxicam (MOBIC) 15 MG tablet Take 1 tablet (15 mg total) by mouth daily. (Patient taking differently: Take 15 mg by mouth as needed.)   metFORMIN (GLUCOPHAGE) 500 MG tablet Take 1 tablet (500 mg total) by mouth 2 (two) times daily with a meal.   montelukast (SINGULAIR) 10 MG tablet Take 1 tablet (10 mg total) by mouth daily.   NOVOLOG FLEXPEN 100 UNIT/ML FlexPen Inject 10-16 Units into the skin 3 times daily with meals.   olmesartan-hydrochlorothiazide (BENICAR HCT) 40-25 MG tablet Take 1 tablet  by mouth daily.   pantoprazole (PROTONIX) 40 MG tablet Take 1 tablet (40 mg total) by mouth daily.   promethazine (PHENERGAN) 25 MG tablet Take 1 tablet (25 mg total) by mouth every 8 (eight) hours as needed.   rizatriptan (MAXALT-MLT) 10 MG disintegrating tablet Dissolve 1 tablet in mouth as needed for  migraine. May repeat in 2 hours if needed   Semaglutide (RYBELSUS) 7 MG TABS Take 1 tablet (7 mg total) by mouth daily.   TRESIBA FLEXTOUCH 100 UNIT/ML FlexTouch Pen INJECT 60 UNITS into THE SKIN AT BEDTIME   Continuous Blood Gluc Sensor (FREESTYLE LIBRE 3 SENSOR) MISC Use to check glucose continuously and change sensor every 14 days.   fluconazole (DIFLUCAN) 150 MG tablet Take 1 tablet (150 mg total) by mouth every 3 (three) days. (Patient not taking: Reported on 07/25/2022)   oxyCODONE-acetaminophen (PERCOCET/ROXICET) 5-325 MG tablet Take 1 tablet by mouth every 6 (six) hours as needed. (Patient not taking: Reported on 07/25/2022)   [DISCONTINUED] Continuous Blood Gluc Sensor (FREESTYLE LIBRE 3 SENSOR) MISC Use to check glucose continuously and change sensor every 14 days. (Patient not taking: Reported on 07/25/2022)   No facility-administered encounter medications on file as of 07/25/2022.    ALLERGIES: Allergies  Allergen Reactions   Corn-Containing Products Hives    "really bad asthma attacks"   Honey Shortness Of Breath   Mango Flavor Hives and Swelling    Pt allergic to all mango - tongue and throat swelling   Glipizide     Other reaction(s): Other bad yeast infection   Liraglutide Nausea And Vomiting    victoza    VACCINATION STATUS: Immunization History  Administered Date(s) Administered   Pneumococcal Polysaccharide-23 12/18/2018   Pneumococcal-Unspecified 04/23/1997   Td 09/06/2004   Tdap 09/13/2016    Diabetes She presents for her follow-up diabetic visit. She has type 2 diabetes mellitus. Onset time: diagnosed at approx age of 32. Her disease course has been fluctuating.  There are no hypoglycemic associated symptoms. There are no diabetic associated symptoms. Pertinent negatives for diabetes include no polyuria and no weight loss. There are no hypoglycemic complications. There are no diabetic complications. Risk factors for coronary artery disease include diabetes mellitus, dyslipidemia, obesity, hypertension and family history. Current diabetic treatment includes oral agent (dual therapy) and intensive insulin program. She is compliant with treatment most of the time. Her weight is increasing steadily. She is following a generally unhealthy diet. When asked about meal planning, she reported none. She has not had a previous visit with a dietitian. She participates in exercise daily. (She presents today with no meter or logs to review.  She has had trouble getting her CGM from the pharmacy despite being approved by insurance.  Her POCT A1c today is 8.3%, increasing from last visit of 7.7%.  She denies any significant hypoglycemia or symptoms of such.  She has tolerated the addition of Rybelsus well, did have a stomach bug last week which made her sick and glucose fluctuated as a result.) An ACE inhibitor/angiotensin II receptor blocker is being taken. She sees a podiatrist.Eye exam is current.     Review of systems  Constitutional: + increasing body weight, current Body mass index is 45.66 kg/m., no fatigue, no subjective hyperthermia, no subjective hypothermia Eyes: no blurry vision, no xerophthalmia ENT: no sore throat, no nodules palpated in throat, no dysphagia/odynophagia, no hoarseness Cardiovascular: no chest pain, no shortness of breath, no palpitations, no leg swelling Respiratory: no cough, no shortness of breath Gastrointestinal: no nausea/vomiting/diarrhea Musculoskeletal: no muscle/joint aches Skin: no rashes, no hyperemia Neurological: no tremors, no numbness, no tingling, no dizziness Psychiatric: no depression, no anxiety  Objective:     BP 119/80  (BP Location: Right Arm, Patient Position: Sitting, Cuff Size: Large)   Pulse 84   Ht 5\' 5"  (1.651 m)  Wt 274 lb 6.4 oz (124.5 kg)   BMI 45.66 kg/m   Wt Readings from Last 3 Encounters:  07/25/22 274 lb 6.4 oz (124.5 kg)  04/25/22 267 lb 6.4 oz (121.3 kg)  01/24/22 268 lb (121.6 kg)     BP Readings from Last 3 Encounters:  07/25/22 119/80  04/25/22 119/78  01/24/22 (!) 122/92      Physical Exam- Limited  Constitutional:  Body mass index is 45.66 kg/m. , not in acute distress, normal state of mind Eyes:  EOMI, no exophthalmos Musculoskeletal: no gross deformities, strength intact in all four extremities, no gross restriction of joint movements Skin:  no rashes, no hyperemia Neurological: no tremor with outstretched hands    CMP ( most recent) CMP     Component Value Date/Time   NA 140 10/18/2021 1307   NA 140 04/05/2021 1607   K 3.4 (L) 10/18/2021 1307   CL 104 10/18/2021 1307   CO2 24 01/04/2021 1249   GLUCOSE 175 (H) 10/18/2021 1307   BUN 18 10/18/2021 1307   BUN 12 04/05/2021 1607   CREATININE 0.60 10/18/2021 1307   CALCIUM 9.1 04/05/2021 1607   PROT 6.7 04/05/2021 1607   ALBUMIN 4.3 04/05/2021 1607   AST 25 04/05/2021 1607   ALT 33 01/04/2021 1249   ALKPHOS 103 04/05/2021 1607   BILITOT 0.2 04/05/2021 1607   GFRNONAA >60 01/04/2021 1249   GFRAA 119 03/03/2020 1246     Diabetic Labs (most recent): Lab Results  Component Value Date   HGBA1C 7.7 (A) 04/25/2022   HGBA1C 8.6 (H) 09/14/2021   HGBA1C 9.9 (A) 07/05/2021   HGBA1C 9.9 07/05/2021   HGBA1C 9.9 (A) 07/05/2021   HGBA1C 9.9 (A) 07/05/2021   MICROALBUR 4.03 (H) 02/09/2009     Lipid Panel ( most recent) Lipid Panel     Component Value Date/Time   CHOL 167 09/14/2021 1041   TRIG 185 (H) 09/14/2021 1041   HDL 40 09/14/2021 1041   CHOLHDL 4.2 09/14/2021 1041   CHOLHDL 3.6 Ratio 02/09/2009 2112   VLDL 36 02/09/2009 2112   [place] 95 09/14/2021 1041   LABVLDL 32 09/14/2021 1041      Lab  Results  Component Value Date   TSH 2.780 02/11/2020   TSH 5.090 (H) 02/10/2018   TSH 4.370 04/08/2017   TSH 3.370 02/22/2011   TSH 2.259 02/07/2010   TSH 1.924 02/09/2009   FREET4 1.14 02/19/2018           Assessment & Plan:   1) Type 2 diabetes mellitus with hyperglycemia, with long-term current use of insulin ([place])  She presents today with no meter or logs to review.  She has had trouble getting her CGM from the pharmacy despite being approved by insurance.  Her POCT A1c today is 8.3%, increasing from last visit of 7.7%.  She denies any significant hypoglycemia or symptoms of such.  She has tolerated the addition of Rybelsus well, did have a stomach bug last week which made her sick and glucose fluctuated as a result.  - ALPA SCHEHR has currently uncontrolled symptomatic type 2 DM since 46 years of age.   -Recent labs reviewed.  - I had a long discussion with her about the progressive nature of diabetes and the pathology behind its complications. -her diabetes is not currently complicated but she remains at a high risk for more acute and chronic complications which include CAD, CVA, CKD, retinopathy, and neuropathy. These are all discussed in detail with her.  The following Lifestyle Medicine recommendations according to [facility]) were discussed and offered to patient and she agrees to start the journey:  A. Whole Foods, Plant-based plate comprising of fruits and vegetables, plant-based proteins, whole-grain carbohydrates was discussed in detail with the patient.   A list for source of those nutrients were also provided to the patient.  Patient will use only water or unsweetened tea for hydration. B.  The need to stay away from risky substances including alcohol, smoking; obtaining 7 to 9 hours of restorative sleep, at least 150 minutes of moderate intensity exercise weekly, the importance of healthy social connections,  and stress reduction  techniques were discussed. C.  A full color page of  Calorie density of various food groups per pound showing examples of each food groups was provided to the patient.  - Nutritional counseling repeated at each appointment due to patients tendency to fall back in to old habits.  - The patient admits there is a room for improvement in their diet and drink choices. -  Suggestion is made for the patient to avoid simple carbohydrates from their diet including Cakes, Sweet Desserts / Pastries, Ice Cream, Soda (diet and regular), Sweet Tea, Candies, Chips, Cookies, Sweet Pastries, Store Bought Juices, Alcohol in Excess of 1-2 drinks a day, Artificial Sweeteners, Coffee Creamer, and "Sugar-free" Products. This will help patient to have stable blood glucose profile and potentially avoid unintended weight gain.   - I encouraged the patient to switch to unprocessed or minimally processed complex starch and increased protein intake (animal or plant source), fruits, and vegetables.   - Patient is advised to stick to a routine mealtimes to eat 3 meals a day and avoid unnecessary snacks (to snack only to correct hypoglycemia).  - I have approached her with the following individualized plan to manage her diabetes and patient agrees:   -She is advised to continue her Tyler Aas (changed from [employer] due to ins pref) 60 units SQ nightly and continue Novolog 10-16 units TID with meals if glucose is above 90 and she is eating (Specific instructions on how to titrate insulin dosage based on glucose readings given to patient in writing).  She can also continue Metformin 500 mg po twice daily with meals and Rybelsus 7 mg po daily before breakfast for now (may increase on subsequent visits if tolerates well).    -she is encouraged to start monitoring glucose 4 times daily, before meals and before bed and to call the clinic if she has readings less than 70 or above 300 for 3 tests in a row.  She could benefit from CGM device  given her MDI- insurance did not cover Dexcom but did approve for Libre 3, however she is still having trouble getting it from the pharmacy.  I resent the script today and gave her sample to hold her over.  - she is warned not to take insulin without proper monitoring per orders. - Adjustment parameters are given to her for hypo and hyperglycemia in writing.  - her Wilder Glade was previously discontinued, risk outweighs benefit for this patient.  Has history of vaginal yeast infections as a result of this medication.  - Specific targets for  A1c; LDL, HDL, and Triglycerides were discussed with the patient.  2) Blood Pressure /Hypertension:  her blood pressure is controlled to target.   she is advised to continue her current medications including Benicar 40-25 mg po daily.  3) Lipids/Hyperlipidemia:    Review of her  recent lipid panel from 09/14/21 showed controlled LDL at 95 and slightly elevated triglycerides of 185 .  She is not currently on any lipid lowering medications, was on Simvastatin but stopped after recent recall.  Will recheck lipid panel prior to next visit.  4)  Weight/Diet:  her Body mass index is 45.66 kg/m.  -  clearly complicating her diabetes care.   she is a candidate for weight loss. I discussed with her the fact that loss of 5 - 10% of her  current body weight will have the most impact on her diabetes management.  Exercise, and detailed carbohydrates information provided  -  detailed on discharge instructions.  5) Chronic Care/Health Maintenance: -she is on ACEI/ARB and not currently on Statin medications and is encouraged to initiate and continue to follow up with Ophthalmology, Dentist, Podiatrist at least yearly or according to recommendations, and advised to stay away from smoking. I have recommended yearly flu vaccine and pneumonia vaccine at least every 5 years; moderate intensity exercise for up to 150 minutes weekly; and sleep for at least 7 hours a day.  - she is  advised to maintain close follow up with Fenton Foy, NP for primary care needs, as well as her other providers for optimal and coordinated care.     I spent  46  minutes in the care of the patient today including review of labs from [place], Lipids, Thyroid Function, Hematology (current and previous including abstractions from other facilities); face-to-face time discussing  her blood glucose readings/logs, discussing hypoglycemia and hyperglycemia episodes and symptoms, medications doses, her options of short and long term treatment based on the latest standards of care / guidelines;  discussion about incorporating lifestyle medicine;  and documenting the encounter. Risk reduction counseling performed per USPSTF guidelines to reduce obesity and cardiovascular risk factors.     Please refer to Patient Instructions for Blood Glucose Monitoring and Insulin/Medications Dosing Guide"  in media tab for additional information. Please  also refer to " Patient Self Inventory" in the Media  tab for reviewed elements of pertinent patient history.  [name] participated in the discussions, expressed understanding, and voiced agreement with the above plans.  All questions were answered to her satisfaction. she is encouraged to contact clinic should she have any questions or concerns prior to her return visit.     Follow up plan: - Return in about 3 months (around 10/24/2022) for Diabetes F/U with A1c in office, Bring meter and logs, Previsit labs.   Rayetta Pigg, [facility] [address] [place], [place] [address] Phone: [phone] Fax: [phone]  07/25/2022, 4:49 PM

## 2022-07-26 ENCOUNTER — Ambulatory Visit: Payer: Self-pay | Admitting: Clinical

## 2022-07-26 DIAGNOSIS — Z658 Other specified problems related to psychosocial circumstances: Secondary | ICD-10-CM

## 2022-07-26 DIAGNOSIS — F411 Generalized anxiety disorder: Secondary | ICD-10-CM

## 2022-07-26 NOTE — BH Specialist Note (Signed)
Integrated Behavioral Health via Telemedicine Visit  07/26/2022 Lori Shea [id number]  I reviewed patient visit with the [facility] Intern, and I concur with the treatment plan, as documented in the [facility] Intern note.   No charge for this visit due to [facility] Intern seeing patient.  Vesta Mixer, MSW, [place] for [facility] at [facility]   Number of [place] Clinician visits: 2- Second Visit  Session Start time: [phone]   Session End time: [phone]  Total time in minutes: 20   Referring Provider: Aletha Halim, MD  Patient/Family location: Work [facility])  [facility] Provider [facility]  All persons participating in visit: [name]  Types of Service: Individual psychotherapy  I connected with Lori Shea and/or Lori Shea's via  Telephone or Video Enabled Telemedicine Application  (Video is Caregility application) and verified that I am speaking with the correct person using two identifiers. Discussed confidentiality: Yes   I discussed the limitations of telemedicine and the availability of in person appointments.  Discussed there is a possibility of technology failure and discussed alternative modes of communication if that failure occurs.  I discussed that engaging in this telemedicine visit, they consent to the provision of behavioral healthcare and the services will be billed under their insurance.  Patient and/or legal guardian expressed understanding and consented to Telemedicine visit: Yes   Presenting Concerns: Patient and/or family reports the following symptoms/concerns: Pt concerned with increase in anxiety symptoms  Duration of problem: ongoing; Severity of problem: moderate     07/11/2022    9:43 AM 10/02/2021    9:38 AM 09/13/2021    1:44 PM 01/04/2021    8:50 AM  GAD 7 : Generalized Anxiety Score  Nervous, Anxious, on Edge 3 0 0 3  Control/stop  worrying 2 0 0 3  Worry too much - different things 2 0 0 3  Trouble relaxing 2 0 0 1  Restless 1 0 0 1  Easily annoyed or irritable 3 0 0 3  Afraid - awful might happen 2 0 0 2  Total GAD 7 Score 15 0 0 16  Anxiety Difficulty Very difficult Not difficult at all        Patient and/or Family's Strengths/Protective Factors: Social connections, Social and Emotional competence, Concrete supports in place (healthy food, safe environments, etc.), and Sense of purpose  Goals Addressed: Patient will:  Reduce symptoms of: anxiety, mood instability, and stress   Increase knowledge and/or ability of: coping skills, healthy habits, and stress reduction   Demonstrate ability to: Increase healthy adjustment to current life circumstances and begin medication management   Progress towards Goals: Ongoing  Interventions: Interventions utilized:  Solution-Focused Strategies and Supportive Counseling Standardized Assessments completed: Not Needed  Patient and/or Family Response: Pt agrees with treatment plan  Plan: Follow up with behavioral health clinician on : 4/18 @4 :15 PM  Behavioral recommendations: -Continue prioritizing healthy self-care (regular meals, adequate rest; allowing practical help from supportive friends and family) - Begin journaling and tracking symptoms of anxiety to understand triggers -Follow up with psychiatry for medication management [facility] Intern placed referral) Referral(s): [place] (In Clinic) and Psychiatrist  I discussed the assessment and treatment plan with the patient and/or parent/guardian. They were provided an opportunity to ask questions and all were answered. They agreed with the plan and demonstrated an understanding of the instructions.   They were advised to call back or  seek an in-person evaluation if the symptoms worsen or if the condition fails to improve as anticipated.  Alba Destine

## 2022-08-04 ENCOUNTER — Encounter (HOSPITAL_COMMUNITY): Payer: Self-pay

## 2022-08-04 ENCOUNTER — Ambulatory Visit (INDEPENDENT_AMBULATORY_CARE_PROVIDER_SITE_OTHER): Payer: 59

## 2022-08-04 ENCOUNTER — Ambulatory Visit (HOSPITAL_COMMUNITY)
Admission: RE | Admit: 2022-08-04 | Discharge: 2022-08-04 | Disposition: A | Discharge status: Home / Self Care | Payer: 59 | Source: Ambulatory Visit | Attending: Internal Medicine | Admitting: Internal Medicine

## 2022-08-04 VITALS — BP 153/89 | HR 88 | Temp 98.8°F | Resp 20

## 2022-08-04 DIAGNOSIS — R059 Cough, unspecified: Secondary | ICD-10-CM | POA: Diagnosis not present

## 2022-08-04 DIAGNOSIS — J4521 Mild intermittent asthma with (acute) exacerbation: Secondary | ICD-10-CM | POA: Diagnosis not present

## 2022-08-04 DIAGNOSIS — J019 Acute sinusitis, unspecified: Secondary | ICD-10-CM | POA: Diagnosis not present

## 2022-08-04 MED ORDER — PREDNISONE 20 MG PO TABS: 40.0000 mg | ORAL_TABLET | Freq: Every day | ORAL | 0 refills | Status: AC

## 2022-08-04 MED ORDER — BENZONATATE 100 MG PO CAPS: 100.0000 mg | ORAL_CAPSULE | Freq: Three times a day (TID) | ORAL | 0 refills | Status: DC

## 2022-08-04 MED ORDER — METHYLPREDNISOLONE SODIUM SUCC 125 MG IJ SOLR
80.0000 mg | Freq: Once | INTRAMUSCULAR | Status: AC
  Administered 2022-08-04: 80 mg via INTRAMUSCULAR

## 2022-08-04 MED ORDER — AMOXICILLIN-POT CLAVULANATE 875-125 MG PO TABS: 1.0000 | ORAL_TABLET | Freq: Two times a day (BID) | ORAL | 0 refills | Status: DC

## 2022-08-04 MED ORDER — METHYLPREDNISOLONE SODIUM SUCC 125 MG IJ SOLR
INTRAMUSCULAR | Status: AC
  Filled 2022-08-04: qty 2

## 2022-08-04 MED ORDER — PROMETHAZINE-DM 6.25-15 MG/5ML PO SYRP: 5.0000 mL | ORAL_SOLUTION | Freq: Every evening | ORAL | 0 refills | Status: DC | PRN

## 2022-08-04 NOTE — Discharge Instructions (Signed)
Your evaluation shows you have a bacterial sinus infection plus a viral infection of the upper airways of your lungs. Use the following medicines to help with your symptoms:  - Take antibiotic sent to pharmacy as directed to treat sinus infection. - Take steroid sent to pharmacy as directed starting tomorrow. Do not take any other NSAID containing medications such as ibuprofen or naproxen/Aleve while taking prednisone. - You may use albuterol inhaler 1 to 2 puffs every 4-6 hours as needed for cough, shortness of breath, and wheezing. - Tessalon perles every 8 hours as needed for cough. - Take Promethazine DM cough medication to help with your cough at nighttime so that you are able to sleep. Do not drive, drink alcohol, or go to work while taking this medication since it can make you sleepy. Only take this at nighttime.  - Purchase Mucinex over the counter and take this every 12 hours as needed for nasal congestion and cough.  If you develop any new or worsening symptoms or do not improve in the next 2 to 3 days, please return.  If your symptoms are severe, please go to the emergency room.  Follow-up with your primary care provider for further evaluation and management of your symptoms as well as ongoing wellness visits.  I hope you feel better!  

## 2022-08-04 NOTE — ED Triage Notes (Signed)
Reports had covid 3 weeks ago.  Reports has cough and congestion that has been on going for 2 weeks. Reports got worse in past couple days. Reports getting up green phlegm mucous. Took Mucinex DM, Nyquil. Today doing breathing tx every 3-4 hours today. Pain in ears and head.

## 2022-08-04 NOTE — ED Provider Notes (Signed)
[facility]    CSN: [id number] Arrival date & time: 08/04/22  1600      History   Chief Complaint Chief Complaint  Patient presents with   Cough    Trouble breathing,upper lungs hurting.Congestion. - Entered by patient    HPI Lori Shea is a 47 y.o. female.   Patient presents to urgent care for evaluation of cough, shortness of breath, chest congestion, and nasal congestion that has been persistent for the last 3 weeks.  She tested positive for COVID-19 3 weeks ago and was treated with molnupiravir successfully.  She states her symptoms initially improved, then worsened a couple of weeks ago.  Cough is productive with green phlegm.  She states she has experienced intermittent shortness of breath in between using her albuterol nebulizer treatments.  She has been using albuterol nebulizer treatments every 4 hours for the last few days due to wheezing and shortness of breath.  Denies orthopnea, leg swelling, recent antibiotic/steroid use, dizziness, nausea, vomiting, abdominal pain, ear pain, chest pain, and heart palpitations.  History of asthma that is usually well-controlled with as needed use of inhaler.  History of type 2 diabetes as well.  States her blood sugars have been well-controlled recently with use of her diabetic medications.  She has continued to take her medications for her chronic illnesses despite being sick.  Last use her albuterol nebulizer machine approximately 6 hours ago.  No recent fever, chills, or bodyaches.  Using Mucinex DM to help with symptoms without much relief.  Nasal congestion is persistent and causing maxillofacial pain as well as frontal headaches.  No dizziness or blurry vision reported.  Non-smoker, denies drug use.   Cough   Past Medical History:  Diagnosis Date   Allergy    Foods:  mango, honey, corn.  Allergic to "everything tested"  15 years ago in [place]   Anemia    with pregnancy   Anxiety    Arthritis    Shoulder and  hips   Asthma age 73 yo   Triggers:  cold weather, exercise, allergens, anxiety, URIs   Cholelithiasis age 49 yo   Asymptomatic   Closed avulsion fracture of lateral malleolus of right fibula 12/27/2016   COVID-19    Diabetes mellitus type 2 2012   Was diagnosed 2 years earlier with PCOS   Elevated liver enzymes 09/13/2016   none since 2018   GERD (gastroesophageal reflux disease)    Heart murmur    with pregnancy 20 yrs ago per pt on 10-12-2021   History of kidney stones    Hypertension 2015   Kidney stones    family history of renal failure   Migraines 2009   Morbid obesity    PCOS (polycystic ovarian syndrome) 2010   Pneumonia    several years ago   Wears glasses     Patient Active Problem List   Diagnosis Date Noted   Chronic diarrhea 01/25/2022   History of colonic polyps 01/25/2022   Gastroesophageal reflux disease 01/25/2022   Chronic nausea 01/25/2022   Gastritis without bleeding 01/25/2022   Stress incontinence of urine 01/02/2021   Skin irritation 01/02/2021   Hepatic steatosis 12/01/2019   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/2019   Hemoglobin A1C greater than 9%, indicating poor diabetic control 03/23/2019   Hyperglycemia 03/23/2019   Class 3 severe obesity due to excess calories with serious comorbidity and body mass index (BMI) of 40.0 to 44.9 in adult 03/23/2019   Yeast infection 03/23/2019  BMI 40.0-44.9, adult 03/09/2019   Cervical dysplasia 07/31/2017   Alopecia of scalp 05/09/2017   Pain in left toe(s) 12/27/2016   Morbid obesity 09/13/2016   Elevated liver enzymes 09/13/2016   Allergy    Asthma    Hypertension 04/23/2013   Irregular periods 02/22/2011   Type 2 diabetes mellitus with hyperglycemia, with long-term current use of insulin 04/23/2010   PCOS (polycystic ovarian syndrome) 04/23/2008   Migraines 04/24/2007    Past Surgical History:  Procedure Laterality Date   CHOLECYSTECTOMY N/A 12/01/2019   Procedure: LAPAROSCOPIC  CHOLECYSTECTOMY;  Surgeon: Darnell Level, MD;  Location: [facility];  Service: General;  Laterality: N/A;   colonscopy and endoscopy  05/25/2021   COLPOSCOPY N/A 2021   CYST EXCISION     DILITATION & CURRETTAGE/HYSTROSCOPY WITH NOVASURE ABLATION N/A 10/18/2021   Procedure: DILATATION & CURETTAGE/HYSTEROSCOPY WITH NOVASURE ABLATION;  Surgeon: Winfield Bing, MD;  Location: [facility];  Service: Gynecology;  Laterality: N/A;   LAPAROSCOPIC TUBAL LIGATION Bilateral 01/28/2019   Procedure: LAPAROSCOPIC TUBAL LIGATION AND PAP SMEAR;  Surgeon:  Bing, MD;  Location: [facility];  Service: Gynecology;  Laterality: Bilateral;   TONSILLECTOMY AND ADENOIDECTOMY Bilateral 12/23/2018   Procedure: TONSILLECTOMY AND ADENOIDECTOMY;  Surgeon: Newman Pies, MD;  Location: [facility];  Service: ENT;  Laterality: Bilateral;    OB History     Gravida  4   Para  1   Term  1   Preterm      AB  3   Living  1      SAB  3   IAB      Ectopic      Multiple      Live Births               Home Medications    Prior to Admission medications   Medication Sig Start Date End Date Taking? Authorizing Provider  amoxicillin-clavulanate (AUGMENTIN) 875-125 MG tablet Take 1 tablet by mouth every 12 (twelve) hours. 08/04/22  Yes Carlisle Beers, FNP  benzonatate (TESSALON) 100 MG capsule Take 1 capsule (100 mg total) by mouth every 8 (eight) hours. 08/04/22  Yes Carlisle Beers, FNP  predniSONE (DELTASONE) 20 MG tablet Take 2 tablets (40 mg total) by mouth daily for 5 days. 08/04/22 08/09/22 Yes Carlisle Beers, FNP  promethazine-dextromethorphan (PROMETHAZINE-DM) 6.25-15 MG/5ML syrup Take 5 mLs by mouth at bedtime as needed for cough. 08/04/22  Yes Carlisle Beers, FNP  albuterol (PROVENTIL) (2.5 MG/3ML) 0.083% nebulizer solution Take 3 mLs (2.5 mg total) by nebulization every 6 (six) hours as needed for wheezing or shortness of breath.  09/12/20   Barbette Merino, NP  albuterol (VENTOLIN HFA) 108 (90 Base) MCG/ACT inhaler INHALE 2 PUFFS INTO THE LUNGS EVERY 4 HOURS AS NEEDED FOR FOR WHEEZING OR SHORTNESS OF BREATH 09/11/21   Ivonne Andrew, NP  amitriptyline (ELAVIL) 25 MG tablet TAKE 1 TABLET BY MOUTH NIGHTLY AT BEDTIME 09/01/21   Passmore, Enid Derry I, NP  cetirizine (ZYRTEC) 10 MG tablet Take 1 tablet (10 mg total) by mouth daily. Patient taking differently: Take 10 mg by mouth at bedtime. 09/14/21   Passmore, Enid Derry I, NP  cholestyramine (QUESTRAN) 4 g packet take 1 PACKET BY MOUTH AS NEEDED FOR DIARRHEA. Not TO be taken WITHIN 4 hours OF any other medication 02/20/22   Mansouraty, Netty Starring., MD  Continuous Blood Gluc Sensor (FREESTYLE LIBRE 3 SENSOR) MISC Use to check glucose continuously and change  sensor every 14 days. 07/25/22   Dani Gobble, NP  cyclobenzaprine (FLEXERIL) 5 MG tablet Take 1 tablet (5 mg total) by mouth 3 (three) times daily as needed for muscle spasms. 12/28/20   Barbette Merino, NP  EASY COMFORT PEN NEEDLES 32G X 4 MM MISC use as directed test 2 times daily 09/14/21   Orion Crook I, NP  fluconazole (DIFLUCAN) 150 MG tablet Take 1 tablet (150 mg total) by mouth every 3 (three) days. Patient not taking: Reported on 07/25/2022 11/14/21   [place] Bing, MD  fluticasone-salmeterol (ADVAIR DISKUS) 100-50 MCG/ACT AEPB Inhale 1 puff into the lungs 2 (two) times daily. 09/14/21   Passmore, Enid Derry I, NP  ibuprofen (ADVIL) 600 MG tablet Take 1 tablet (600 mg total) by mouth every 6 (six) hours as needed. 10/18/21   [place] Bing, MD  Insulin Pen Needle (PEN NEEDLES) 32G X 5 MM MISC 100 each by Does not apply route 2 (two) times daily. 09/14/21   Passmore, Enid Derry I, NP  meloxicam (MOBIC) 15 MG tablet Take 1 tablet (15 mg total) by mouth daily. Patient taking differently: Take 15 mg by mouth as needed. 09/14/21 09/14/22  Orion Crook I, NP  metFORMIN (GLUCOPHAGE) 500 MG tablet Take 1 tablet (500 mg total) by mouth  2 (two) times daily with a meal. 09/14/21 09/14/22  Passmore, Enid Derry I, NP  montelukast (SINGULAIR) 10 MG tablet Take 1 tablet (10 mg total) by mouth daily. 09/14/21 09/14/22  Passmore, Enid Derry I, NP  NOVOLOG FLEXPEN 100 UNIT/ML FlexPen Inject 10-16 Units into the skin 3 times daily with meals. 07/10/22   Dani Gobble, NP  olmesartan-hydrochlorothiazide (BENICAR HCT) 40-25 MG tablet Take 1 tablet by mouth daily. 09/14/21 09/14/22  Orion Crook I, NP  oxyCODONE-acetaminophen (PERCOCET/ROXICET) 5-325 MG tablet Take 1 tablet by mouth every 6 (six) hours as needed. Patient not taking: Reported on 07/25/2022 10/18/21   [place] Bing, MD  pantoprazole (PROTONIX) 40 MG tablet Take 1 tablet (40 mg total) by mouth daily. 05/30/22   Mansouraty, Netty Starring., MD  promethazine (PHENERGAN) 25 MG tablet Take 1 tablet (25 mg total) by mouth every 8 (eight) hours as needed. 01/24/22   Mansouraty, Netty Starring., MD  rizatriptan (MAXALT-MLT) 10 MG disintegrating tablet Dissolve 1 tablet in mouth as needed for migraine. May repeat in 2 hours if needed 01/08/22   Ivonne Andrew, NP  Semaglutide (RYBELSUS) 7 MG TABS Take 1 tablet (7 mg total) by mouth daily. 06/22/22   Dani Gobble, NP  TRESIBA FLEXTOUCH 100 UNIT/ML FlexTouch Pen INJECT 60 UNITS into THE SKIN AT BEDTIME 05/29/22   Dani Gobble, NP    Family History Family History  Problem Relation Age of Onset   Healthy Mother    Diabetes Father    Hyperlipidemia Father    Hypertension Father    Heart disease Father    Kidney disease Father        Developed after 2nd CABG   Colon polyps Father    Diabetes Paternal Grandfather    Heart disease Paternal Grandfather    Hyperlipidemia Paternal Grandfather    Hypertension Paternal Grandfather    Kidney disease Paternal Grandfather     Social History Social History   Tobacco Use   Smoking status: Never   Smokeless tobacco: Never  Vaping Use   Vaping Use: Never used  Substance Use Topics   Alcohol  use: No   Drug use: No     Allergies   Corn-containing products,  Honey, Mango flavor, Glipizide, and Liraglutide   Review of Systems Review of Systems  Respiratory:  Positive for cough.   Prior HPI   Physical Exam Triage Vital Signs ED Triage Vitals  Enc Vitals Group     BP 08/04/22 1617 (!) 153/89     Pulse Rate 08/04/22 1617 88     Resp 08/04/22 1617 20     Temp 08/04/22 1617 98.8 F (37.1 C)     Temp Source 08/04/22 1617 Oral     SpO2 08/04/22 1617 95 %     Weight --      Height --      Head Circumference --      Peak Flow --      Pain Score 08/04/22 1615 7     Pain Loc --      Pain Edu? --      Excl. in GC? --    No data found.  Updated Vital Signs BP (!) 153/89 (BP Location: Right Arm)   Pulse 88   Temp 98.8 F (37.1 C) (Oral)   Resp 20   LMP 10/14/2021 (Exact Date)   SpO2 95%   Visual Acuity Right Eye Distance:   Left Eye Distance:   Bilateral Distance:    Right Eye Near:   Left Eye Near:    Bilateral Near:     Physical Exam Vitals and nursing note reviewed.  Constitutional:      Appearance: She is not ill-appearing or toxic-appearing.  HENT:     Head: Normocephalic and atraumatic.     Right Ear: Hearing and external ear normal.     Left Ear: Hearing and external ear normal.     Nose: Congestion present.     Right Sinus: Maxillary sinus tenderness present.     Left Sinus: Maxillary sinus tenderness present.     Mouth/Throat:     Lips: Pink.     Mouth: Mucous membranes are moist. No injury.     Tongue: No lesions. Tongue does not deviate from midline.     Palate: No mass and lesions.     Pharynx: Oropharynx is clear. Uvula midline. No pharyngeal swelling, oropharyngeal exudate, posterior oropharyngeal erythema or uvula swelling.     Tonsils: No tonsillar exudate or tonsillar abscesses.     Comments: Small amount of clear postnasal drainage visualized to the posterior oropharynx.  Eyes:     General: Lids are normal. Vision grossly intact.  Gaze aligned appropriately.     Extraocular Movements: Extraocular movements intact.     Conjunctiva/sclera: Conjunctivae normal.  Cardiovascular:     Rate and Rhythm: Normal rate and regular rhythm.     Heart sounds: Normal heart sounds, S1 normal and S2 normal.  Pulmonary:     Effort: Pulmonary effort is normal. No respiratory distress.     Breath sounds: Normal air entry. Wheezing (Faint expiratory wheezing heard to the bilateral lower lung fields without respiratory distress.) present.  Musculoskeletal:     Cervical back: Neck supple.  Lymphadenopathy:     Cervical: Cervical adenopathy present.  Skin:    General: Skin is warm and dry.     Capillary Refill: Capillary refill takes less than 2 seconds.     Findings: No rash.  Neurological:     General: No focal deficit present.     Mental Status: She is alert and oriented to person, place, and time. Mental status is at baseline.     Cranial Nerves: No dysarthria or facial asymmetry.  Psychiatric:        Mood and Affect: Mood normal.        Speech: Speech normal.        Behavior: Behavior normal.        Thought Content: Thought content normal.        Judgment: Judgment normal.      UC Treatments / Results  Labs (all labs ordered are listed, but only abnormal results are displayed) Labs Reviewed - No data to display  EKG   Radiology DG Chest 2 View  Result Date: 08/04/2022 CLINICAL DATA:  Persistent cough post COVID infection 3 weeks ago. EXAM: CHEST - 2 VIEW COMPARISON:  Chest x-ray dated 11/02/2019. FINDINGS: Heart size and mediastinal contours are within normal limits. Lungs are clear. No pleural effusion or pneumothorax is seen. No acute-appearing osseous abnormality. Stable kyphosis of the thoracic spine. IMPRESSION: No active cardiopulmonary disease. No evidence of pneumonia or pulmonary edema. Electronically Signed   By: Bary Richard M.D.   On: 08/04/2022 16:40    Procedures Procedures (including critical care  time)  Medications Ordered in UC Medications  methylPREDNISolone sodium succinate (SOLU-MEDROL) 125 mg/2 mL injection 80 mg (80 mg Intramuscular Given 08/04/22 1703)    Initial Impression / Assessment and Plan / UC Course  I have reviewed the triage vital signs and the nursing notes.  Pertinent labs & imaging results that were available during my care of the patient were reviewed by me and considered in my medical decision making (see chart for details).   1.  Acute sinusitis, mild intermittent asthma with acute exacerbation Presentation is consistent with asthma exacerbation triggered by recent COVID-19 illness and acute sinusitis with symptoms present for greater than 2 weeks.  Will provide antibacterial coverage due to persistent symptoms with description of double sickening.  Augmentin antibiotic twice daily for the next 7 days.  No allergies to antibiotics.  Solu-Medrol injection given in clinic, prednisone steroid burst to be started tomorrow.  No NSAID while taking prednisone.  She is a type II diabetic and has been made aware that this medication may cause her blood sugars to increase temporarily.  Advised to watch her diet over the next several days while taking steroid.  Tessalon Perles every 8 hours as needed for cough.  Promethazine DM at bedtime as needed for cough, drowsiness precautions discussed.  May continue using albuterol inhaler/nebulizer every 4-6 hours as needed for cough, shortness of breath, and wheeze.  Chest x-ray is negative for acute cardiopulmonary process.  PCP follow-up in the next 1 to 2 weeks recommended.  Nontoxic in appearance with hemodynamically stable vital signs.  No new oxygen requirement.  Oxygenating at 95% on room air at baseline.  Respiratory rate is stable.  Afebrile.  Discussed physical exam and available lab work findings in clinic with patient.  Counseled patient regarding appropriate use of medications and potential side effects for all medications  recommended or prescribed today. Discussed red flag signs and symptoms of worsening condition,when to call the PCP office, return to urgent care, and when to seek higher level of care in the emergency department. Patient verbalizes understanding and agreement with plan. All questions answered. Patient discharged in stable condition.    Final Clinical Impressions(s) / UC Diagnoses   Final diagnoses:  Acute sinusitis with symptoms > 10 days  Mild intermittent asthma with acute exacerbation     Discharge Instructions      Your evaluation shows you have a bacterial sinus infection plus a viral  infection of the upper airways of your lungs. Use the following medicines to help with your symptoms:  - Take antibiotic sent to pharmacy as directed to treat sinus infection. - Take steroid sent to pharmacy as directed starting tomorrow. Do not take any other NSAID containing medications such as ibuprofen or naproxen/Aleve while taking prednisone. - You may use albuterol inhaler 1 to 2 puffs every 4-6 hours as needed for cough, shortness of breath, and wheezing. - Tessalon perles every 8 hours as needed for cough. - Take Promethazine DM cough medication to help with your cough at nighttime so that you are able to sleep. Do not drive, drink alcohol, or go to work while taking this medication since it can make you sleepy. Only take this at nighttime.  - Purchase Mucinex over the counter and take this every 12 hours as needed for nasal congestion and cough.  If you develop any new or worsening symptoms or do not improve in the next 2 to 3 days, please return.  If your symptoms are severe, please go to the emergency room.  Follow-up with your primary care provider for further evaluation and management of your symptoms as well as ongoing wellness visits.  I hope you feel better!     ED Prescriptions     Medication Sig Dispense Auth. Provider   benzonatate (TESSALON) 100 MG capsule Take 1 capsule (100 mg  total) by mouth every 8 (eight) hours. 21 capsule Carlisle Beers, FNP   promethazine-dextromethorphan (PROMETHAZINE-DM) 6.25-15 MG/5ML syrup Take 5 mLs by mouth at bedtime as needed for cough. 118 mL Reita May M, FNP   amoxicillin-clavulanate (AUGMENTIN) 875-125 MG tablet Take 1 tablet by mouth every 12 (twelve) hours. 14 tablet Reita May M, FNP   predniSONE (DELTASONE) 20 MG tablet Take 2 tablets (40 mg total) by mouth daily for 5 days. 10 tablet Carlisle Beers, FNP      PDMP not reviewed this encounter.   Carlisle Beers, [place] 08/04/22 1709

## 2022-08-09 ENCOUNTER — Ambulatory Visit: Payer: Self-pay | Admitting: Clinical

## 2022-08-09 DIAGNOSIS — Z658 Other specified problems related to psychosocial circumstances: Secondary | ICD-10-CM

## 2022-08-09 NOTE — BH Specialist Note (Signed)
Integrated Behavioral Health via Telemedicine Visit  08/09/2022 CYLAH FANNIN [id number]  Number of Integrated Behavioral Health Clinician visits: 3- Third Visit  Session Start time: [phone]   Session End time: 0435  Total time in minutes: 20 I reviewed patient visit with the [facility] Intern, and I concur with the treatment plan, as documented in the [facility] Intern note.   No charge for this visit due to [facility] Intern seeing patient.  Hulda Marin, MSW, LCSW Integrated Behavioral Health Clinician Center for [facility] Healthcare at [facility]   Referring Provider: [place] Bing, MD  Patient/Family location: Work [facility]) [facility] Provider location: [facility]  All persons participating in visit: [facility] Intern and Pt  Types of Service: Individual psychotherapy  I connected with Bjorn Pippin and/or Letha [place]'s via  Telephone or Video Enabled Telemedicine Application  (Video is Caregility application) and verified that I am speaking with the correct person using two identifiers. Discussed confidentiality: Yes   I discussed the limitations of telemedicine and the availability of in person appointments.  Discussed there is a possibility of technology failure and discussed alternative modes of communication if that failure occurs.  I discussed that engaging in this telemedicine visit, they consent to the provision of behavioral healthcare and the services will be billed under their insurance.  Patient and/or legal guardian expressed understanding and consented to Telemedicine visit: Yes   Presenting Concerns: Patient and/or family reports the following symptoms/concerns: Increased agitation and anxiety symptoms  Duration of problem: ongoing ; Severity of problem: moderate  Patient and/or Family's Strengths/Protective Factors: Social connections, Social and Emotional competence, Concrete supports in place (healthy food, safe environments, etc.), and Sense of  purpose  Goals Addressed: Patient will:  Reduce symptoms of: agitation and anxiety   Increase knowledge and/or ability of: coping skills, healthy habits, and stress reduction   Demonstrate ability to: Increase healthy adjustment to current life circumstances and Increase adequate support systems for patient/family  Progress towards Goals: Ongoing  Interventions: Interventions utilized:  Solution-Focused Strategies, Supportive Counseling, and Psychoeducation and/or Health Education Standardized Assessments completed: Not Needed  Patient and/or Family Response: Pt agrees with treatment plan    Plan: Follow up with behavioral health clinician on : Pt with follow-up with psychiatry  Behavioral recommendations: -Continue prioritizing healthy self-care (regular meals, adequate rest; allowing practical help from supportive friends and family)  - Pt will continue journaling and tracking agitation symptoms  -Pt will return call to psychiatry regarding referral  Referral(s): Psychiatrist  I discussed the assessment and treatment plan with the patient and/or parent/guardian. They were provided an opportunity to ask questions and all were answered. They agreed with the plan and demonstrated an understanding of the instructions.   They were advised to call back or seek an in-person evaluation if the symptoms worsen or if the condition fails to improve as anticipated.  Reynaldo Minium

## 2022-08-13 ENCOUNTER — Other Ambulatory Visit (HOSPITAL_COMMUNITY): Payer: Self-pay

## 2022-08-13 NOTE — Telephone Encounter (Signed)
Pharmacy Patient Advocate Encounter  Prior Authorization for [name] has been approved by [employer]).    Effective dates: 07/09/22 through 07/09/23 Pt has already filled   Approval letter already in media

## 2022-09-05 ENCOUNTER — Encounter: Payer: Self-pay | Admitting: Nurse Practitioner

## 2022-09-10 MED ORDER — PEN NEEDLES 31G X 8 MM MISC: 3 refills | Status: DC

## 2022-09-10 MED ORDER — TRESIBA FLEXTOUCH 100 UNIT/ML ~~LOC~~ SOPN: 70.0000 [IU] | PEN_INJECTOR | Freq: Every day | SUBCUTANEOUS | 3 refills | Status: DC

## 2022-09-20 ENCOUNTER — Telehealth: Payer: Self-pay

## 2022-09-21 ENCOUNTER — Other Ambulatory Visit: Payer: Self-pay | Admitting: Nurse Practitioner

## 2022-09-21 DIAGNOSIS — J45909 Unspecified asthma, uncomplicated: Secondary | ICD-10-CM

## 2022-09-21 MED ORDER — FLUTICASONE-SALMETEROL 100-50 MCG/ACT IN AEPB: 1.0000 | INHALATION_SPRAY | Freq: Two times a day (BID) | RESPIRATORY_TRACT | 11 refills | Status: DC

## 2022-09-22 ENCOUNTER — Other Ambulatory Visit: Payer: Self-pay | Admitting: Gastroenterology

## 2022-09-24 NOTE — Telephone Encounter (Signed)
Medication filled on 09/21/22.

## 2022-10-01 ENCOUNTER — Other Ambulatory Visit: Payer: Self-pay | Admitting: Nurse Practitioner

## 2022-10-02 ENCOUNTER — Other Ambulatory Visit: Payer: Self-pay | Admitting: Gastroenterology

## 2022-10-02 DIAGNOSIS — K229 Disease of esophagus, unspecified: Secondary | ICD-10-CM

## 2022-10-02 DIAGNOSIS — K219 Gastro-esophageal reflux disease without esophagitis: Secondary | ICD-10-CM

## 2022-10-02 DIAGNOSIS — K2970 Gastritis, unspecified, without bleeding: Secondary | ICD-10-CM

## 2022-10-02 MED ORDER — CHOLESTYRAMINE 4 G PO PACK
4.0000 g | PACK | Freq: Two times a day (BID) | ORAL | 0 refills | Status: AC
Start: 2022-10-02 — End: ?

## 2022-10-04 ENCOUNTER — Other Ambulatory Visit: Payer: Self-pay | Admitting: Nurse Practitioner

## 2022-10-04 DIAGNOSIS — G4700 Insomnia, unspecified: Secondary | ICD-10-CM

## 2022-10-04 DIAGNOSIS — I10 Essential (primary) hypertension: Secondary | ICD-10-CM

## 2022-10-04 DIAGNOSIS — E119 Type 2 diabetes mellitus without complications: Secondary | ICD-10-CM

## 2022-10-04 DIAGNOSIS — J4530 Mild persistent asthma, uncomplicated: Secondary | ICD-10-CM

## 2022-10-04 DIAGNOSIS — J45909 Unspecified asthma, uncomplicated: Secondary | ICD-10-CM

## 2022-10-04 MED ORDER — METFORMIN HCL 500 MG PO TABS
500.0000 mg | ORAL_TABLET | Freq: Two times a day (BID) | ORAL | 0 refills | Status: DC
Start: 2022-10-04 — End: 2022-10-16

## 2022-10-04 MED ORDER — AMITRIPTYLINE HCL 25 MG PO TABS: ORAL_TABLET | ORAL | 0 refills | Status: DC

## 2022-10-04 MED ORDER — OLMESARTAN MEDOXOMIL-HCTZ 40-25 MG PO TABS: 1.0000 | ORAL_TABLET | Freq: Every day | ORAL | 0 refills | Status: DC

## 2022-10-04 MED ORDER — FLUTICASONE-SALMETEROL 100-50 MCG/ACT IN AEPB: 1.0000 | INHALATION_SPRAY | Freq: Two times a day (BID) | RESPIRATORY_TRACT | 0 refills | Status: DC

## 2022-10-04 MED ORDER — MONTELUKAST SODIUM 10 MG PO TABS: 10.0000 mg | ORAL_TABLET | Freq: Every day | ORAL | 0 refills | Status: DC

## 2022-10-10 ENCOUNTER — Encounter: Payer: Self-pay | Admitting: Nurse Practitioner

## 2022-10-10 ENCOUNTER — Ambulatory Visit: Payer: 59 | Admitting: Nurse Practitioner

## 2022-10-10 VITALS — BP 127/83 | HR 79 | Temp 97.0°F | Wt 276.8 lb

## 2022-10-10 DIAGNOSIS — E1165 Type 2 diabetes mellitus with hyperglycemia: Secondary | ICD-10-CM | POA: Diagnosis not present

## 2022-10-10 DIAGNOSIS — E66813 Obesity, class 3: Secondary | ICD-10-CM

## 2022-10-10 DIAGNOSIS — Z794 Long term (current) use of insulin: Secondary | ICD-10-CM | POA: Diagnosis not present

## 2022-10-10 DIAGNOSIS — Z6841 Body Mass Index (BMI) 40.0 and over, adult: Secondary | ICD-10-CM

## 2022-10-10 DIAGNOSIS — Z13220 Encounter for screening for lipoid disorders: Secondary | ICD-10-CM | POA: Diagnosis not present

## 2022-10-10 DIAGNOSIS — N390 Urinary tract infection, site not specified: Secondary | ICD-10-CM

## 2022-10-10 LAB — POCT GLYCOSYLATED HEMOGLOBIN (HGB A1C): Hemoglobin A1C: 8.5 % — AB (ref 4.0–5.6)

## 2022-10-10 LAB — POCT URINALYSIS DIP (PROADVANTAGE DEVICE)
Bilirubin, UA: NEGATIVE
Blood, UA: NEGATIVE
Glucose, UA: NEGATIVE mg/dL
Ketones, POC UA: NEGATIVE mg/dL
Nitrite, UA: NEGATIVE
Specific Gravity, Urine: 1.02
pH, UA: 6.5 (ref 5.0–8.0)

## 2022-10-10 MED ORDER — NITROFURANTOIN MONOHYD MACRO 100 MG PO CAPS
100.0000 mg | ORAL_CAPSULE | Freq: Two times a day (BID) | ORAL | 0 refills | Status: AC
Start: 2022-10-10 — End: 2022-10-17

## 2022-10-10 NOTE — Assessment & Plan Note (Signed)
-   Amb Ref to Medical Weight Management   2. Type 2 diabetes mellitus with hyperglycemia, with long-term current use of insulin (HCC)  - Microalbumin/Creatinine Ratio, Urine - CMP14+EGFR - POCT Urinalysis DIP (Proadvantage Device) - Ambulatory referral to Podiatry - Ambulatory referral to Ophthalmology   Follow up:  Follow up in 3 months

## 2022-10-10 NOTE — Patient Instructions (Addendum)
1. Class 3 severe obesity due to excess calories without serious comorbidity with body mass index (BMI) of 45.0 to 49.9 in adult (HCC)  - Amb Ref to Medical Weight Management   2. Type 2 diabetes mellitus with hyperglycemia, with long-term current use of insulin (HCC)  - Microalbumin/Creatinine Ratio, Urine - CMP14+EGFR - POCT Urinalysis DIP (Proadvantage Device) - Ambulatory referral to Podiatry - Ambulatory referral to Ophthalmology   4. Urinary tract infection without hematuria, site unspecified  - nitrofurantoin, macrocrystal-monohydrate, (MACROBID) 100 MG capsule; Take 1 capsule (100 mg total) by mouth 2 (two) times daily for 7 days.  Dispense: 14 capsule; Refill: 0     Follow up:  Follow up in 6 months

## 2022-10-10 NOTE — Progress Notes (Signed)
@Patient  ID: Lori Shea, female    DOB: February 04, 1976, 47 y.o.   MRN: [id number]  Chief Complaint  Patient presents with   Diabetes    Follow up    Referring provider: Ivonne Andrew, NP   HPI  Lori Shea 47 y.o. female  has a past medical history of Allergy, Anemia, Anxiety, Arthritis, Asthma (age 49 yo), Cholelithiasis (age 53 yo), Closed avulsion fracture of lateral malleolus of right fibula (12/27/2016), COVID-19, Diabetes mellitus (2012), Elevated liver enzymes (09/13/2016), GERD (gastroesophageal reflux disease), Heart murmur, History of kidney stones, Hypertension (2015), Kidney stones, Migraines (2009), Morbid obesity (HCC), PCOS (polycystic ovarian syndrome) (2010), and Pneumonia. To the [facility] for reevaluation of DM2.    Diabetes Mellitus: Patient presents for follow up of diabetes. Patient is followed by endocrinology. Symptoms: none. Patient denies any symptoms. Patient denies foot ulcerations, hypoglycemia , and nausea.  Evaluation to date has been included: hemoglobin A1C. Treatment to date: no recent interventions. Will continue to follow with endocrinology. A1C in office today was 8.0.    Denies any fatigue, chest pain, shortness of breath, HA or dizziness. Denies any blurred vision, numbness or tingling.     Allergies  Allergen Reactions   Corn-Containing Products Hives    "really bad asthma attacks"   Honey Shortness Of Breath   Mango Flavor Hives and Swelling    Pt allergic to all mango - tongue and throat swelling   Glipizide     Other reaction(s): Other bad yeast infection   Liraglutide Nausea And Vomiting    victoza    Immunization History  Administered Date(s) Administered   Pneumococcal Polysaccharide-23 12/18/2018   Pneumococcal-Unspecified 04/23/1997   Td 09/06/2004   Tdap 09/13/2016    Past Medical History:  Diagnosis Date   Allergy    Foods:  mango, honey, corn.  Allergic to "everything tested"  15 years ago in [place]   Anemia     with pregnancy   Anxiety    Arthritis    Shoulder and hips   Asthma age 61 yo   Triggers:  cold weather, exercise, allergens, anxiety, URIs   Cholelithiasis age 58 yo   Asymptomatic   Closed avulsion fracture of lateral malleolus of right fibula 12/27/2016   COVID-19    Diabetes mellitus type 2 2012   Was diagnosed 2 years earlier with PCOS   Elevated liver enzymes 09/13/2016   none since 2018   GERD (gastroesophageal reflux disease)    Heart murmur    with pregnancy 20 yrs ago per pt on 10-12-2021   History of kidney stones    Hypertension 2015   Kidney stones    family history of renal failure   Migraines 2009   Morbid obesity (HCC)    PCOS (polycystic ovarian syndrome) 2010   Pneumonia    several years ago   Wears glasses     Tobacco History: Social History   Tobacco Use  Smoking Status Never  Smokeless Tobacco Never   Counseling given: Not Answered   Outpatient Encounter Medications as of 10/10/2022  Medication Sig   nitrofurantoin, macrocrystal-monohydrate, (MACROBID) 100 MG capsule Take 1 capsule (100 mg total) by mouth 2 (two) times daily for 7 days.   albuterol (PROVENTIL) (2.5 MG/3ML) 0.083% nebulizer solution Take 3 mLs (2.5 mg total) by nebulization every 6 (six) hours as needed for wheezing or shortness of breath.   albuterol (VENTOLIN HFA) 108 (90 Base) MCG/ACT inhaler INHALE 2 PUFFS INTO THE LUNGS EVERY 4  HOURS AS NEEDED FOR FOR WHEEZING OR SHORTNESS OF BREATH   amitriptyline (ELAVIL) 25 MG tablet Take 1 tablet by mouth nightly at bedtime   amoxicillin-clavulanate (AUGMENTIN) 875-125 MG tablet Take 1 tablet by mouth every 12 (twelve) hours.   benzonatate (TESSALON) 100 MG capsule Take 1 capsule (100 mg total) by mouth every 8 (eight) hours.   cetirizine (ZYRTEC) 10 MG tablet Take 1 tablet (10 mg total) by mouth daily. (Patient taking differently: Take 10 mg by mouth at bedtime.)   cholestyramine (QUESTRAN) 4 g packet Take 1 packet (4 g total) by mouth 2  (two) times daily.   Continuous Blood Gluc Sensor (FREESTYLE LIBRE 3 SENSOR) MISC Use to check glucose continuously and change sensor every 14 days.   cyclobenzaprine (FLEXERIL) 5 MG tablet Take 1 tablet (5 mg total) by mouth 3 (three) times daily as needed for muscle spasms.   fluconazole (DIFLUCAN) 150 MG tablet Take 1 tablet (150 mg total) by mouth every 3 (three) days. (Patient not taking: Reported on 07/25/2022)   fluticasone-salmeterol (ADVAIR DISKUS) 100-50 MCG/ACT AEPB Inhale 1 puff into the lungs 2 (two) times daily.   ibuprofen (ADVIL) 600 MG tablet Take 1 tablet (600 mg total) by mouth every 6 (six) hours as needed.   insulin degludec (TRESIBA FLEXTOUCH) 100 UNIT/ML FlexTouch Pen Inject 70 Units into the skin at bedtime.   Insulin Pen Needle (PEN NEEDLES) 31G X 8 MM MISC Use to inject insulin 4 times daily   metFORMIN (GLUCOPHAGE) 500 MG tablet Take 1 tablet (500 mg total) by mouth 2 (two) times daily with a meal.   montelukast (SINGULAIR) 10 MG tablet Take 1 tablet (10 mg total) by mouth daily.   NOVOLOG FLEXPEN 100 UNIT/ML FlexPen Inject 10-16 Units into the skin 3 times daily with meals.   olmesartan-hydrochlorothiazide (BENICAR HCT) 40-25 MG tablet Take 1 tablet by mouth daily.   oxyCODONE-acetaminophen (PERCOCET/ROXICET) 5-325 MG tablet Take 1 tablet by mouth every 6 (six) hours as needed. (Patient not taking: Reported on 07/25/2022)   pantoprazole (PROTONIX) 40 MG tablet TAKE 1 TABLET BY MOUTH EVERY DAY   promethazine (PHENERGAN) 25 MG tablet Take 1 tablet (25 mg total) by mouth every 8 (eight) hours as needed.   promethazine-dextromethorphan (PROMETHAZINE-DM) 6.25-15 MG/5ML syrup Take 5 mLs by mouth at bedtime as needed for cough.   rizatriptan (MAXALT-MLT) 10 MG disintegrating tablet Dissolve 1 tablet in mouth as needed for migraine. May repeat in 2 hours if needed   RYBELSUS 7 MG TABS TAKE 1 TABLET BY MOUTH EVERY DAY   No facility-administered encounter medications on file as of  10/10/2022.     Review of Systems  Review of Systems  Constitutional: Negative.   HENT: Negative.    Cardiovascular: Negative.   Gastrointestinal: Negative.   Allergic/Immunologic: Negative.   Neurological: Negative.   Psychiatric/Behavioral: Negative.         Physical Exam  BP 127/83   Pulse 79   Temp (!) 97 F (36.1 C)   Wt 276 lb 12.8 oz (125.6 kg)   LMP 10/14/2021 (Exact Date)   SpO2 99%   BMI 46.06 kg/m   Wt Readings from Last 5 Encounters:  10/10/22 276 lb 12.8 oz (125.6 kg)  07/25/22 274 lb 6.4 oz (124.5 kg)  04/25/22 267 lb 6.4 oz (121.3 kg)  01/24/22 268 lb (121.6 kg)  11/09/21 272 lb (123.4 kg)     Physical Exam Vitals and nursing note reviewed.  Constitutional:      General:  She is not in acute distress.    Appearance: She is well-developed.  Cardiovascular:     Rate and Rhythm: Normal rate and regular rhythm.  Pulmonary:     Effort: Pulmonary effort is normal.     Breath sounds: Normal breath sounds.  Neurological:     Mental Status: She is alert and oriented to person, place, and time.      Lab Results:  CBC    Component Value Date/Time   WBC 9.0 01/04/2021 1249   RBC 4.29 01/04/2021 1249   HGB 13.9 10/18/2021 1307   HGB 12.9 06/24/2020 1353   HCT 41.0 10/18/2021 1307   HCT 39.2 06/24/2020 1353   PLT 260 01/04/2021 1249   PLT 267 06/24/2020 1353   MCV 84.6 01/04/2021 1249   MCV 85 06/24/2020 1353   MCH 29.1 01/04/2021 1249   MCHC 34.4 01/04/2021 1249   RDW 13.6 01/04/2021 1249   RDW 14.0 06/24/2020 1353   LYMPHSABS 3.0 01/04/2021 1249   LYMPHSABS 3.2 (H) 06/24/2020 1353   MONOABS 0.5 01/04/2021 1249   EOSABS 0.3 01/04/2021 1249   EOSABS 0.2 06/24/2020 1353   BASOSABS 0.1 01/04/2021 1249   BASOSABS 0.1 06/24/2020 1353    BMET    Component Value Date/Time   NA 140 10/18/2021 1307   NA 140 04/05/2021 1607   K 3.4 (L) 10/18/2021 1307   CL 104 10/18/2021 1307   CO2 24 01/04/2021 1249   GLUCOSE 175 (H) 10/18/2021 1307    BUN 18 10/18/2021 1307   BUN 12 04/05/2021 1607   CREATININE 0.60 10/18/2021 1307   CALCIUM 9.1 04/05/2021 1607   GFRNONAA >60 01/04/2021 1249   GFRAA 119 03/03/2020 1246     Assessment & Plan:   Type 2 diabetes mellitus with hyperglycemia, with long-term current use of insulin (HCC) - Amb Ref to Medical Weight Management   2. Type 2 diabetes mellitus with hyperglycemia, with long-term current use of insulin (HCC)  - Microalbumin/Creatinine Ratio, Urine - CMP14+EGFR - POCT Urinalysis DIP (Proadvantage Device) - Ambulatory referral to Podiatry - Ambulatory referral to Ophthalmology   Follow up:  Follow up in 3 months      Ivonne Andrew, NP 10/10/2022

## 2022-10-11 ENCOUNTER — Other Ambulatory Visit: Payer: Self-pay

## 2022-10-11 DIAGNOSIS — J329 Chronic sinusitis, unspecified: Secondary | ICD-10-CM

## 2022-10-11 LAB — MICROALBUMIN / CREATININE URINE RATIO
Creatinine, Urine: 146 mg/dL
Microalb/Creat Ratio: 37 mg/g creat — ABNORMAL HIGH (ref 0–29)
Microalbumin, Urine: 54.6 ug/mL

## 2022-10-11 MED ORDER — CETIRIZINE HCL 10 MG PO TABS
10.0000 mg | ORAL_TABLET | Freq: Every day | ORAL | 11 refills | Status: DC
Start: 2022-10-11 — End: 2022-11-06

## 2022-10-15 ENCOUNTER — Other Ambulatory Visit: Payer: Self-pay | Admitting: Nurse Practitioner

## 2022-10-15 DIAGNOSIS — E119 Type 2 diabetes mellitus without complications: Secondary | ICD-10-CM

## 2022-10-15 DIAGNOSIS — J4530 Mild persistent asthma, uncomplicated: Secondary | ICD-10-CM

## 2022-10-15 DIAGNOSIS — I10 Essential (primary) hypertension: Secondary | ICD-10-CM

## 2022-10-15 DIAGNOSIS — G4700 Insomnia, unspecified: Secondary | ICD-10-CM

## 2022-10-23 ENCOUNTER — Ambulatory Visit (INDEPENDENT_AMBULATORY_CARE_PROVIDER_SITE_OTHER): Payer: 59 | Admitting: Podiatry

## 2022-10-23 DIAGNOSIS — M7661 Achilles tendinitis, right leg: Secondary | ICD-10-CM

## 2022-10-23 DIAGNOSIS — E119 Type 2 diabetes mellitus without complications: Secondary | ICD-10-CM

## 2022-10-23 DIAGNOSIS — M7662 Achilles tendinitis, left leg: Secondary | ICD-10-CM | POA: Diagnosis not present

## 2022-10-23 MED ORDER — MELOXICAM 7.5 MG PO TABS
7.5000 mg | ORAL_TABLET | Freq: Every day | ORAL | 0 refills | Status: DC
Start: 2022-10-23 — End: 2023-07-12

## 2022-10-23 NOTE — Patient Instructions (Signed)

## 2022-10-23 NOTE — Progress Notes (Unsigned)
Chief Complaint  Patient presents with   Foot Problem    Pt states she I just sore right now no actual pain, she is here for diabetic exam and achilles tendinitis follow up.   HPI: 47 y.o. female presenting today for follow-up of left Achilles tendinitis.  She notes that she was doing pretty well but recently had a flareup.  She wears her cam walker when it is painful and has improvement of her symptoms.  Patient is diabetic.  She denies any numbness or burning in her feet.  Past Medical History:  Diagnosis Date   Allergy    Foods:  mango, honey, corn.  Allergic to "everything tested"  15 years ago in [place]   Anemia    with pregnancy   Anxiety    Arthritis    Shoulder and hips   Asthma age 53 yo   Triggers:  cold weather, exercise, allergens, anxiety, URIs   Cholelithiasis age 25 yo   Asymptomatic   Closed avulsion fracture of lateral malleolus of right fibula 12/27/2016   COVID-19    Diabetes mellitus type 2 2012   Was diagnosed 2 years earlier with PCOS   Elevated liver enzymes 09/13/2016   none since 2018   GERD (gastroesophageal reflux disease)    Heart murmur    with pregnancy 20 yrs ago per pt on 10-12-2021   History of kidney stones    Hypertension 2015   Kidney stones    family history of renal failure   Migraines 2009   Morbid obesity (HCC)    PCOS (polycystic ovarian syndrome) 2010   Pneumonia    several years ago   Wears glasses     Past Surgical History:  Procedure Laterality Date   CHOLECYSTECTOMY N/A 12/01/2019   Procedure: LAPAROSCOPIC CHOLECYSTECTOMY;  Surgeon: Darnell Level, MD;  Location: [facility];  Service: General;  Laterality: N/A;   colonscopy and endoscopy  05/25/2021   COLPOSCOPY N/A 2021   CYST EXCISION     DILITATION & CURRETTAGE/HYSTROSCOPY WITH NOVASURE ABLATION N/A 10/18/2021   Procedure: DILATATION & CURETTAGE/HYSTEROSCOPY WITH NOVASURE ABLATION;  Surgeon: Green Lane Bing, MD;  Location: [facility];  Service:  Gynecology;  Laterality: N/A;   LAPAROSCOPIC TUBAL LIGATION Bilateral 01/28/2019   Procedure: LAPAROSCOPIC TUBAL LIGATION AND PAP SMEAR;  Surgeon: Park Layne Bing, MD;  Location: [facility];  Service: Gynecology;  Laterality: Bilateral;   TONSILLECTOMY AND ADENOIDECTOMY Bilateral 12/23/2018   Procedure: TONSILLECTOMY AND ADENOIDECTOMY;  Surgeon: Newman Pies, MD;  Location: [facility];  Service: ENT;  Laterality: Bilateral;    Allergies  Allergen Reactions   Corn-Containing Products Hives    "really bad asthma attacks"   Honey Shortness Of Breath   Mango Flavor Hives and Swelling    Pt allergic to all mango - tongue and throat swelling   Glipizide     Other reaction(s): Other bad yeast infection   Liraglutide Nausea And Vomiting    victoza     Physical Exam: General: The patient is alert and oriented x3 in no acute distress.  Dermatology:  No ecchymosis, erythema, or edema bilateral.  No open lesions.    Vascular: Palpable pedal pulses bilaterally. Capillary refill within normal limits.  No appreciable edema.    Neurological: Light touch sensation intact bilateral.  MMT 5/5 to lower extremity bilateral. Negative Tinel's sign with percussion of the posterior tibial nerve on the affected extremity.  Protective sensation intact using Semmes Weinstein monofilament bilateral foot and toes.  Vibratory sensation is intact bilateral.  Musculoskeletal Exam:  There is pain on palpation of the posterior aspect of the left heel.  No palpable gaps or nodules noted within the achilles tendon.  Antalgic gait noted with first steps out of exam chair.  No pain on palpation of the plantar heel.  No ecchymosis or edema to posterior heel.  Assessment/Plan of Care: 1. Achilles tendonitis, bilateral   2. Encounter for diabetic foot exam (HCC)     Meds ordered this encounter  Medications   meloxicam (MOBIC) 7.5 MG tablet    Sig: Take 1 tablet (7.5 mg total) by mouth daily.     Dispense:  30 tablet    Refill:  0   AMB REFERRAL TO PHYSICAL THERAPY  -Reviewed etiology of achilles tendonitis with patient.  Discussed treatment options with patient today, including cortisone injection, NSAID course of treatment, stretching exercises, use of night splint, physical therapy, rest, icing the heel, arch supports/orthotics, and supportive shoe gear.    Order written for physical therapy to address the continued left Achilles tendinitis.  Also prescription for meloxicam 7.5 mg 1 tablet daily for 30 days was sent to her pharmacy.  She was given stretching exercises for the Achilles.  Patient encouraged to inspect her feet daily due to diabetes.  She was informed she has very good circulation and sensation at this time.  Trying to avoid fluctuations in blood sugar and the importance of proper blood glucose control was discussed.  Return in about 4 weeks (around 11/20/2022) for f/u achilles tendonitis.   Clerance Lav, DPM, FACFAS [facility]     [address] [place], [place] [address]                Office [phone]  Fax [phone]

## 2022-10-31 ENCOUNTER — Other Ambulatory Visit: Payer: Self-pay | Admitting: Nurse Practitioner

## 2022-11-02 LAB — COMPREHENSIVE METABOLIC PANEL
ALT: 24 IU/L (ref 0–32)
AST: 18 IU/L (ref 0–40)
Albumin: 4.2 g/dL (ref 3.9–4.9)
Alkaline Phosphatase: 115 IU/L (ref 44–121)
BUN/Creatinine Ratio: 20 (ref 9–23)
BUN: 13 mg/dL (ref 6–24)
Bilirubin Total: 0.4 mg/dL (ref 0.0–1.2)
CO2: 20 mmol/L (ref 20–29)
Calcium: 9.9 mg/dL (ref 8.7–10.2)
Chloride: 100 mmol/L (ref 96–106)
Creatinine, Ser: 0.64 mg/dL (ref 0.57–1.00)
Globulin, Total: 2.7 g/dL (ref 1.5–4.5)
Glucose: 205 mg/dL — ABNORMAL HIGH (ref 70–99)
Potassium: 4 mmol/L (ref 3.5–5.2)
Sodium: 138 mmol/L (ref 134–144)
Total Protein: 6.9 g/dL (ref 6.0–8.5)
eGFR: 109 mL/min/{1.73_m2} (ref >59)

## 2022-11-02 LAB — LIPID PANEL
Chol/HDL Ratio: 4.9 ratio — ABNORMAL HIGH (ref 0.0–4.4)
Cholesterol, Total: 181 mg/dL (ref 100–199)
HDL: 37 mg/dL — ABNORMAL LOW (ref >39)
LDL Chol Calc (NIH): 108 mg/dL — ABNORMAL HIGH (ref 0–99)
Triglycerides: 205 mg/dL — ABNORMAL HIGH (ref 0–149)
VLDL Cholesterol Cal: 36 mg/dL (ref 5–40)

## 2022-11-02 LAB — VITAMIN D 25 HYDROXY (VIT D DEFICIENCY, FRACTURES): Vit D, 25-Hydroxy: 27.8 ng/mL — ABNORMAL LOW (ref 30.0–100.0)

## 2022-11-02 LAB — T4, FREE: Free T4: 1.05 ng/dL (ref 0.82–1.77)

## 2022-11-02 LAB — TSH: TSH: 3.77 u[IU]/mL (ref 0.450–4.500)

## 2022-11-06 ENCOUNTER — Ambulatory Visit: Payer: 59 | Admitting: Nurse Practitioner

## 2022-11-06 ENCOUNTER — Encounter: Payer: Self-pay | Admitting: Nurse Practitioner

## 2022-11-06 VITALS — BP 101/66 | HR 91 | Ht 65.0 in | Wt 272.8 lb

## 2022-11-06 DIAGNOSIS — Z794 Long term (current) use of insulin: Secondary | ICD-10-CM | POA: Diagnosis not present

## 2022-11-06 DIAGNOSIS — Z7984 Long term (current) use of oral hypoglycemic drugs: Secondary | ICD-10-CM | POA: Diagnosis not present

## 2022-11-06 DIAGNOSIS — E1165 Type 2 diabetes mellitus with hyperglycemia: Secondary | ICD-10-CM | POA: Diagnosis not present

## 2022-11-06 MED ORDER — RYBELSUS 14 MG PO TABS: 14.0000 mg | ORAL_TABLET | Freq: Every day | ORAL | 3 refills | Status: DC

## 2022-11-06 NOTE — Progress Notes (Signed)
Endocrinology Follow Up Note       11/06/2022, 4:15 PM   Subjective:    Patient ID: Lori Shea, female    DOB: 1975-11-19.  Lori Shea is being seen in follow up after being seen in consultation for management of currently uncontrolled symptomatic diabetes requested by  Ivonne Andrew, NP.   Past Medical History:  Diagnosis Date   Allergy    Foods:  mango, honey, corn.  Allergic to "everything tested"  15 years ago in [place]   Anemia    with pregnancy   Anxiety    Arthritis    Shoulder and hips   Asthma age 86 yo   Triggers:  cold weather, exercise, allergens, anxiety, URIs   Cholelithiasis age 76 yo   Asymptomatic   Closed avulsion fracture of lateral malleolus of right fibula 12/27/2016   COVID-19    Diabetes mellitus type 2 2012   Was diagnosed 2 years earlier with PCOS   Elevated liver enzymes 09/13/2016   none since 2018   GERD (gastroesophageal reflux disease)    Heart murmur    with pregnancy 20 yrs ago per pt on 10-12-2021   History of kidney stones    Hypertension 2015   Kidney stones    family history of renal failure   Migraines 2009   Morbid obesity (HCC)    PCOS (polycystic ovarian syndrome) 2010   Pneumonia    several years ago   Wears glasses     Past Surgical History:  Procedure Laterality Date   CHOLECYSTECTOMY N/A 12/01/2019   Procedure: LAPAROSCOPIC CHOLECYSTECTOMY;  Surgeon: Darnell Level, MD;  Location: [facility];  Service: General;  Laterality: N/A;   colonscopy and endoscopy  05/25/2021   COLPOSCOPY N/A 2021   CYST EXCISION     DILITATION & CURRETTAGE/HYSTROSCOPY WITH NOVASURE ABLATION N/A 10/18/2021   Procedure: DILATATION & CURETTAGE/HYSTEROSCOPY WITH NOVASURE ABLATION;  Surgeon: [place] Bing, MD;  Location: [facility];  Service: Gynecology;  Laterality: N/A;   LAPAROSCOPIC TUBAL LIGATION Bilateral 01/28/2019   Procedure: LAPAROSCOPIC  TUBAL LIGATION AND PAP SMEAR;  Surgeon: [place] Bing, MD;  Location: [facility];  Service: Gynecology;  Laterality: Bilateral;   TONSILLECTOMY AND ADENOIDECTOMY Bilateral 12/23/2018   Procedure: TONSILLECTOMY AND ADENOIDECTOMY;  Surgeon: Newman Pies, MD;  Location: [facility];  Service: ENT;  Laterality: Bilateral;    Social History   Socioeconomic History   Marital status: Divorced    Spouse name: Not on file   Number of children: 1   Years of education: some comm. college   Highest education level: Not on file  Occupational History   Occupation: CNA    Comment: [employer]  Tobacco Use   Smoking status: Never   Smokeless tobacco: Never  Vaping Use   Vaping status: Never Used  Substance and Sexual Activity   Alcohol use: No   Drug use: No   Sexual activity: Not Currently    Birth control/protection: Surgical    Comment: Sprintec.  Tubal ligation  Other Topics Concern   Not on file  Social History Narrative   Originally from [place], [place] to [place]  in 1988.   In [place] in 1999   Lives at home with daughter on [place].   Social Determinants of Health   Financial Resource Strain: Not on file  Food Insecurity: No Food Insecurity (10/02/2021)   Hunger Vital Sign    Worried About Running Out of Food in the Last Year: Never true    Ran Out of Food in the Last Year: Never true  Transportation Needs: No Transportation Needs (10/02/2021)   PRAPARE - Administrator, Civil Service (Medical): No    Lack of Transportation (Non-Medical): No  Physical Activity: Not on file  Stress: Not on file  Social Connections: Unknown (09/05/2021)   Received from [facility]   Social Network    Social Network: Not on file    Family History  Problem Relation Age of Onset   Healthy Mother    Diabetes Father    Hyperlipidemia Father    Hypertension Father    Heart disease Father    Kidney disease Father         Developed after 2nd CABG   Colon polyps Father    Diabetes Paternal Grandfather    Heart disease Paternal Grandfather    Hyperlipidemia Paternal Grandfather    Hypertension Paternal Grandfather    Kidney disease Paternal Grandfather     Outpatient Encounter Medications as of 11/06/2022  Medication Sig   albuterol (PROVENTIL) (2.5 MG/3ML) 0.083% nebulizer solution Take 3 mLs (2.5 mg total) by nebulization every 6 (six) hours as needed for wheezing or shortness of breath.   albuterol (VENTOLIN HFA) 108 (90 Base) MCG/ACT inhaler INHALE 2 PUFFS INTO THE LUNGS EVERY 4 HOURS AS NEEDED FOR FOR WHEEZING OR SHORTNESS OF BREATH   amitriptyline (ELAVIL) 25 MG tablet TAKE 1 TABLET BY MOUTH AT BEDTIME   benzonatate (TESSALON) 100 MG capsule Take 1 capsule (100 mg total) by mouth every 8 (eight) hours.   cholestyramine (QUESTRAN) 4 g packet Take 1 packet (4 g total) by mouth 2 (two) times daily.   Continuous Blood Gluc Sensor (FREESTYLE LIBRE 3 SENSOR) MISC Use to check glucose continuously and change sensor every 14 days.   cyclobenzaprine (FLEXERIL) 5 MG tablet Take 1 tablet (5 mg total) by mouth 3 (three) times daily as needed for muscle spasms.   fluticasone-salmeterol (ADVAIR DISKUS) 100-50 MCG/ACT AEPB Inhale 1 puff into the lungs 2 (two) times daily.   ibuprofen (ADVIL) 600 MG tablet Take 1 tablet (600 mg total) by mouth every 6 (six) hours as needed.   insulin degludec (TRESIBA FLEXTOUCH) 100 UNIT/ML FlexTouch Pen Inject 60 Units into the skin at bedtime.   Insulin Pen Needle (PEN NEEDLES) 31G X 8 MM MISC Use to inject insulin 4 times daily   meloxicam (MOBIC) 7.5 MG tablet Take 1 tablet (7.5 mg total) by mouth daily.   metFORMIN (GLUCOPHAGE) 500 MG tablet TAKE 1 TABLET BY MOUTH 2 TIMES DAILY WITH MEALS   montelukast (SINGULAIR) 10 MG tablet TAKE 1 TABLET BY MOUTH EVERY DAY   NOVOLOG FLEXPEN 100 UNIT/ML FlexPen INJECT 10 TO 16 UNITS SUBCUTANEOUSLY 3 TIMES DAILY WITH MEALS    olmesartan-hydrochlorothiazide (BENICAR HCT) 40-25 MG tablet TAKE 1 TABLET BY MOUTH EVERY DAY   pantoprazole (PROTONIX) 40 MG tablet TAKE 1 TABLET BY MOUTH EVERY DAY   promethazine (PHENERGAN) 25 MG tablet Take 1 tablet (25 mg total) by mouth every 8 (eight) hours as needed.   rizatriptan (MAXALT-MLT) 10 MG disintegrating tablet Dissolve 1 tablet in mouth as  needed for migraine. May repeat in 2 hours if needed   Semaglutide (RYBELSUS) 14 MG TABS Take 1 tablet (14 mg total) by mouth daily.   [DISCONTINUED] RYBELSUS 7 MG TABS TAKE 1 TABLET BY MOUTH EVERY DAY   [DISCONTINUED] amoxicillin-clavulanate (AUGMENTIN) 875-125 MG tablet Take 1 tablet by mouth every 12 (twelve) hours. (Patient not taking: Reported on 11/06/2022)   [DISCONTINUED] cetirizine (ZYRTEC) 10 MG tablet Take 1 tablet (10 mg total) by mouth daily. (Patient not taking: Reported on 11/06/2022)   [DISCONTINUED] fluconazole (DIFLUCAN) 150 MG tablet Take 1 tablet (150 mg total) by mouth every 3 (three) days. (Patient not taking: Reported on 07/25/2022)   [DISCONTINUED] oxyCODONE-acetaminophen (PERCOCET/ROXICET) 5-325 MG tablet Take 1 tablet by mouth every 6 (six) hours as needed. (Patient not taking: Reported on 07/25/2022)   [DISCONTINUED] promethazine-dextromethorphan (PROMETHAZINE-DM) 6.25-15 MG/5ML syrup Take 5 mLs by mouth at bedtime as needed for cough. (Patient not taking: Reported on 11/06/2022)   No facility-administered encounter medications on file as of 11/06/2022.    ALLERGIES: Allergies  Allergen Reactions   Corn-Containing Products Hives    "really bad asthma attacks"   Honey Shortness Of Breath   Mango Flavor Hives and Swelling    Pt allergic to all mango - tongue and throat swelling   Glipizide     Other reaction(s): Other bad yeast infection   Liraglutide Nausea And Vomiting    victoza    VACCINATION STATUS: Immunization History  Administered Date(s) Administered   Pneumococcal Polysaccharide-23 12/18/2018    Pneumococcal-Unspecified 04/23/1997   Td 09/06/2004   Tdap 09/13/2016    Diabetes She presents for her follow-up diabetic visit. She has type 2 diabetes mellitus. Onset time: diagnosed at approx age of 62. Her disease course has been worsening. There are no hypoglycemic associated symptoms. There are no diabetic associated symptoms. Pertinent negatives for diabetes include no polyuria and no weight loss. There are no hypoglycemic complications. There are no diabetic complications. Risk factors for coronary artery disease include diabetes mellitus, dyslipidemia, obesity, hypertension and family history. Current diabetic treatment includes oral agent (dual therapy) and intensive insulin program. She is compliant with treatment most of the time. Her weight is decreasing steadily. She is following a generally unhealthy diet. When asked about meal planning, she reported none. She has not had a previous visit with a dietitian. She participates in exercise daily. Her home blood glucose trend is increasing steadily. Her overall blood glucose range is >200 mg/dl. (She presents today with her CGM showing above target glycemic profile overall.  Her most recent A1c, checked on 6/19 was 8.5%, increasing from last visit of 7.7%.  Analysis of her CGM shows TIR 5%, TAR 95%, TBR 0% with a GMI of 9.1%.  She notes she called the pharmacy for a bad batch of Novolog since last visit, did get a replacement and it has started working better.) An ACE inhibitor/angiotensin II receptor blocker is being taken. She sees a podiatrist.Eye exam is current.     Review of systems  Constitutional: + decreasing body weight, current Body mass index is 45.4 kg/m., no fatigue, no subjective hyperthermia, no subjective hypothermia Eyes: no blurry vision, no xerophthalmia ENT: no sore throat, no nodules palpated in throat, no dysphagia/odynophagia, no hoarseness Cardiovascular: no chest pain, no shortness of breath, no palpitations, no leg  swelling Respiratory: no cough, no shortness of breath Gastrointestinal: no nausea/vomiting/diarrhea Musculoskeletal: no muscle/joint aches Skin: no rashes, no hyperemia Neurological: no tremors, no numbness, no tingling, no dizziness Psychiatric: no  depression, no anxiety  Objective:     BP 101/66 (BP Location: Left Arm, Patient Position: Sitting, Cuff Size: Large)   Pulse 91   Ht 5\' 5"  (1.651 m)   Wt 272 lb 12.8 oz (123.7 kg)   LMP 10/14/2021 (Exact Date)   BMI 45.40 kg/m   Wt Readings from Last 3 Encounters:  11/06/22 272 lb 12.8 oz (123.7 kg)  10/10/22 276 lb 12.8 oz (125.6 kg)  07/25/22 274 lb 6.4 oz (124.5 kg)     BP Readings from Last 3 Encounters:  11/06/22 101/66  10/10/22 127/83  08/04/22 (!) 153/89      Physical Exam- Limited  Constitutional:  Body mass index is 45.4 kg/m. , not in acute distress, normal state of mind Eyes:  EOMI, no exophthalmos Musculoskeletal: no gross deformities, strength intact in all four extremities, no gross restriction of joint movements Skin:  no rashes, no hyperemia Neurological: no tremor with outstretched hands    CMP ( most recent) CMP     Component Value Date/Time   NA 138 11/01/2022 1131   K 4.0 11/01/2022 1131   CL 100 11/01/2022 1131   CO2 20 11/01/2022 1131   GLUCOSE 205 (H) 11/01/2022 1131   GLUCOSE 175 (H) 10/18/2021 1307   BUN 13 11/01/2022 1131   CREATININE 0.64 11/01/2022 1131   CALCIUM 9.9 11/01/2022 1131   PROT 6.9 11/01/2022 1131   ALBUMIN 4.2 11/01/2022 1131   AST 18 11/01/2022 1131   ALT 24 11/01/2022 1131   ALKPHOS 115 11/01/2022 1131   BILITOT 0.4 11/01/2022 1131   GFRNONAA >60 01/04/2021 1249   GFRAA 119 03/03/2020 1246     Diabetic Labs (most recent): Lab Results  Component Value Date   HGBA1C 8.5 (A) 10/10/2022   HGBA1C 7.7 (A) 04/25/2022   HGBA1C 8.6 (H) 09/14/2021   MICROALBUR 4.03 (H) 02/09/2009     Lipid Panel ( most recent) Lipid Panel     Component Value Date/Time   CHOL  181 11/01/2022 1131   TRIG 205 (H) 11/01/2022 1131   HDL 37 (L) 11/01/2022 1131   CHOLHDL 4.9 (H) 11/01/2022 1131   CHOLHDL 3.6 Ratio 02/09/2009 2112   VLDL 36 02/09/2009 2112   LDLCALC 108 (H) 11/01/2022 1131   LABVLDL 36 11/01/2022 1131      Lab Results  Component Value Date   TSH 3.770 11/01/2022   TSH 2.780 02/11/2020   TSH 5.090 (H) 02/10/2018   TSH 4.370 04/08/2017   TSH 3.370 02/22/2011   TSH 2.259 02/07/2010   TSH 1.924 02/09/2009   FREET4 1.05 11/01/2022   FREET4 1.14 02/19/2018           Assessment & Plan:   1) Type 2 diabetes mellitus with hyperglycemia, with long-term current use of insulin (HCC)  She presents today with her CGM showing above target glycemic profile overall.  Her most recent A1c, checked on 6/19 was 8.5%, increasing from last visit of 7.7%.  Analysis of her CGM shows TIR 5%, TAR 95%, TBR 0% with a GMI of 9.1%.  She notes she called the pharmacy for a bad batch of Novolog since last visit, did get a replacement and it has started working better.  - Lori Shea has currently uncontrolled symptomatic type 2 DM since 47 years of age.   -Recent labs reviewed.  - I had a long discussion with her about the progressive nature of diabetes and the pathology behind its complications. -her diabetes is not currently complicated but  she remains at a high risk for more acute and chronic complications which include CAD, CVA, CKD, retinopathy, and neuropathy. These are all discussed in detail with her.  The following Lifestyle Medicine recommendations according to American College of Lifestyle Medicine [facility]) were discussed and offered to patient and she agrees to start the journey:  A. Whole Foods, Plant-based plate comprising of fruits and vegetables, plant-based proteins, whole-grain carbohydrates was discussed in detail with the patient.   A list for source of those nutrients were also provided to the patient.  Patient will use only water or unsweetened  tea for hydration. B.  The need to stay away from risky substances including alcohol, smoking; obtaining 7 to 9 hours of restorative sleep, at least 150 minutes of moderate intensity exercise weekly, the importance of healthy social connections,  and stress reduction techniques were discussed. C.  A full color page of  Calorie density of various food groups per pound showing examples of each food groups was provided to the patient.  - Nutritional counseling repeated at each appointment due to patients tendency to fall back in to old habits.  - The patient admits there is a room for improvement in their diet and drink choices. -  Suggestion is made for the patient to avoid simple carbohydrates from their diet including Cakes, Sweet Desserts / Pastries, Ice Cream, Soda (diet and regular), Sweet Tea, Candies, Chips, Cookies, Sweet Pastries, Store Bought Juices, Alcohol in Excess of 1-2 drinks a day, Artificial Sweeteners, Coffee Creamer, and "Sugar-free" Products. This will help patient to have stable blood glucose profile and potentially avoid unintended weight gain.   - I encouraged the patient to switch to unprocessed or minimally processed complex starch and increased protein intake (animal or plant source), fruits, and vegetables.   - Patient is advised to stick to a routine mealtimes to eat 3 meals a day and avoid unnecessary snacks (to snack only to correct hypoglycemia).  - I have approached her with the following individualized plan to manage her diabetes and patient agrees:   -She is advised to continue her [name] (changed from [employer] due to ins pref) 60 units SQ nightly and continue Novolog 10-16 units TID with meals if glucose is above 90 and she is eating (Specific instructions on how to titrate insulin dosage based on glucose readings given to patient in writing).  She can also continue Metformin 500 mg po twice daily with meals and will increase her Rybelsus to 14 mg po daily before  breakfast for now (she has tolerated this medication well).    -she is encouraged to start monitoring glucose 4 times daily (using her CGM), before meals and before bed and to call the clinic if she has readings less than 70 or above 300 for 3 tests in a row.    - she is warned not to take insulin without proper monitoring per orders. - Adjustment parameters are given to her for hypo and hyperglycemia in writing.  - her Marcelline Deist was previously discontinued, risk outweighs benefit for this patient.  Has history of vaginal yeast infections as a result of this medication.  - Specific targets for  A1c; LDL, HDL, and Triglycerides were discussed with the patient.  2) Blood Pressure /Hypertension:  her blood pressure is controlled to target.   she is advised to continue her current medications including Benicar 40-25 mg po daily.  3) Lipids/Hyperlipidemia:    Review of her recent lipid panel from 09/14/21 showed controlled LDL at 95  and slightly elevated triglycerides of 185 .  She is not currently on any lipid lowering medications, was on Simvastatin but stopped after recent recall.  Will recheck lipid panel prior to next visit.  4)  Weight/Diet:  her Body mass index is 45.4 kg/m.  -  clearly complicating her diabetes care.   she is a candidate for weight loss. I discussed with her the fact that loss of 5 - 10% of her  current body weight will have the most impact on her diabetes management.  Exercise, and detailed carbohydrates information provided  -  detailed on discharge instructions.  5) Chronic Care/Health Maintenance: -she is on ACEI/ARB and not currently on Statin medications and is encouraged to initiate and continue to follow up with Ophthalmology, Dentist, Podiatrist at least yearly or according to recommendations, and advised to stay away from smoking. I have recommended yearly flu vaccine and pneumonia vaccine at least every 5 years; moderate intensity exercise for up to 150 minutes  weekly; and sleep for at least 7 hours a day.  - she is advised to maintain close follow up with Ivonne Andrew, NP for primary care needs, as well as her other providers for optimal and coordinated care.     I spent  28  minutes in the care of the patient today including review of labs from CMP, Lipids, Thyroid Function, Hematology (current and previous including abstractions from other facilities); face-to-face time discussing  her blood glucose readings/logs, discussing hypoglycemia and hyperglycemia episodes and symptoms, medications doses, her options of short and long term treatment based on the latest standards of care / guidelines;  discussion about incorporating lifestyle medicine;  and documenting the encounter. Risk reduction counseling performed per USPSTF guidelines to reduce obesity and cardiovascular risk factors.     Please refer to Patient Instructions for Blood Glucose Monitoring and Insulin/Medications Dosing Guide"  in media tab for additional information. Please  also refer to " Patient Self Inventory" in the Media  tab for reviewed elements of pertinent patient history.  Bjorn Pippin participated in the discussions, expressed understanding, and voiced agreement with the above plans.  All questions were answered to her satisfaction. she is encouraged to contact clinic should she have any questions or concerns prior to her return visit.     Follow up plan: - Return in about 3 months (around 02/06/2023) for Diabetes F/U with A1c in office, No previsit labs, Bring meter and logs.   Ronny Bacon, [facility] [address] [place], [place] [address] Phone: [phone] Fax: [phone]  11/06/2022, 4:15 PM

## 2022-11-19 ENCOUNTER — Encounter: Payer: Self-pay | Admitting: Nurse Practitioner

## 2022-11-20 ENCOUNTER — Ambulatory Visit: Payer: 59 | Admitting: Podiatry

## 2022-11-21 ENCOUNTER — Encounter: Payer: Self-pay | Admitting: Gastroenterology

## 2022-11-21 DIAGNOSIS — R110 Nausea: Secondary | ICD-10-CM

## 2022-11-21 NOTE — Telephone Encounter (Signed)
Dr Meridee Score do you want to order gastric emptying scan per last office note plan?   PLAN  Continue PPI once daily Phenergan as needed Consider gastric emptying study in the future Only on repeat endoscopic evaluation for hemorrhagic gastritis for now Colonoscopy 2026 Consideration of fundoplication referral in future Consider cholestyramine use in future pending patient's diarrheal symptoms and hopefully they continue to be stable with her being off of magnesium supplementation As needed follow-up

## 2022-11-22 NOTE — Addendum Note (Signed)
Addended by: Loretha Stapler on: 11/22/2022 10:07 AM   Modules accepted: Orders

## 2022-11-22 NOTE — Telephone Encounter (Signed)
I recommend that patient move forward with the gastric emptying study. If she is using cholestyramine then she can titrate that upwards but if she is not we can go ahead and initiate that for her. If she is taking magnesium supplementation as she had previously and it stopped, she needs to stop that again. Holding on repeat endoscopy for now. Thanks. GM

## 2022-11-23 ENCOUNTER — Other Ambulatory Visit: Payer: Self-pay

## 2022-11-23 DIAGNOSIS — G43001 Migraine without aura, not intractable, with status migrainosus: Secondary | ICD-10-CM

## 2022-11-23 MED ORDER — RIZATRIPTAN BENZOATE 10 MG PO TBDP
10.0000 mg | ORAL_TABLET | ORAL | 0 refills | Status: AC | PRN
Start: 2022-11-23 — End: ?

## 2022-11-23 NOTE — Telephone Encounter (Signed)
Please advisee if you will fill. KH

## 2022-12-03 ENCOUNTER — Telehealth (INDEPENDENT_AMBULATORY_CARE_PROVIDER_SITE_OTHER): Payer: 59 | Admitting: Family Medicine

## 2022-12-03 NOTE — Progress Notes (Signed)
Office: [phone]  /  Fax: [phone]   TeleHealth Visit:  This visit was completed with telemedicine (audio/video) technology. Lori Shea has verbally consented to this TeleHealth visit. The patient is located at home, the provider is located at home. The participants in this visit include the listed provider and patient. The visit was conducted today via MyChart video.  Initial Visit  Lori Shea was seen via virtual visit today to evaluate for treatment of obesity. She is interested in losing weight to improve overall health and reduce the risk of weight related complications. She presents today to review program treatment options, initial physical assessment, and evaluation.     Height: 5\' 5"  Weight: 272 lbs BMI: 45  She was referred by: PCP  When asked what else they would like to accomplish? She states: {EMHopetoaccomplish:28304}  Weight history: Started struggling with weight ***  When asked how has your weight affected you? She states: {EMWeightAffected:28305}  Some associated conditions: Hyperlipidemia, Fatty liver disease, and Diabetes  Contributing factors: {EMcontributingfactors:28307}  Weight promoting medications identified: {EMWeightpromotingrx:28308}  Current nutrition plan: {EMNutritionplan:28309::"None"}  Current level of physical activity: {EMcurrentPA:28310::"None"}  Current or previous pharmacotherapy: {EM previousRx:28311}  Response to medication: {EMResponsetomedication:28312}  Past medical history includes:   Past Medical History:  Diagnosis Date   Allergy    Foods:  mango, honey, corn.  Allergic to "everything tested"  15 years ago in [place]   Anemia    with pregnancy   Anxiety    Arthritis    Shoulder and hips   Asthma age 47 yo   Triggers:  cold weather, exercise, allergens, anxiety, URIs   Cholelithiasis age 47 yo   Asymptomatic   Closed avulsion fracture of lateral malleolus of right fibula 12/27/2016   COVID-19    Diabetes  mellitus type 2 2012   Was diagnosed 2 years earlier with PCOS   Elevated liver enzymes 09/13/2016   none since 2018   GERD (gastroesophageal reflux disease)    Heart murmur    with pregnancy 47 yrs ago per pt on 10-12-2021   History of kidney stones    Hypertension 2015   Kidney stones    family history of renal failure   Migraines 2009   Morbid obesity (HCC)    PCOS (polycystic ovarian syndrome) 2010   Pneumonia    several years ago   Wears glasses      Objective:     General:  Alert, oriented and cooperative. Patient is in no acute distress.  Respiratory: Normal respiratory effort, no problems with respiration noted  Mental Status: Normal mood and affect. Normal behavior. Normal judgment and thought content.    Assessment and Plan:   1. Type 2 Diabetes Mellitus with hyperglycemia, with long-term current use of insulin Worsening. HgbA1c is not at goal. Last A1c was 8.5, increased from 7.7 five months prior. CBGs: {dwwcbg:29160} Episodes of hypoglycemia: {yes***/no:17258} Medication(s): Rybelsus 14 mg daily before breakfast, Tresiba 60 units daily, metformin 500 mg twice daily, NovoLog 10-16 units 3 times daily with meals.  Lab Results  Component Value Date   HGBA1C 8.5 (A) 10/10/2022   HGBA1C 7.7 (A) 04/25/2022   HGBA1C 8.6 (H) 09/14/2021   Lab Results  Component Value Date   MICROALBUR 4.03 (H) 02/09/2009   LDLCALC 108 (H) 11/01/2022   CREATININE 0.64 11/01/2022   No results found for: "GFR"  Plan: Continue all current medications. Work on lifestyle interventions in conjunction with our program.  2. Nonalcoholic fatty liver disease  She has been  diagnosed with nonalcoholic fatty liver disease.  Fatty liver noted on July 2021-abdominal ultrasound. Most recent liver enzymes are normal.   Lab Results  Component Value Date   ALT 24 11/01/2022   AST 18 11/01/2022   ALKPHOS 115 11/01/2022   BILITOT 0.4 11/01/2022    Plan: Continue to monitor liver  enzymes. Continue to work on weight loss with meal plan and exercise.   3. Obesity Treatment / Action Plan:  Will complete provided nutritional and psychosocial assessment questionnaire before the next appointment. Will be scheduled for indirect calorimetry to determine resting energy expenditure in a fasting state.  This will allow [place] to create a reduced calorie, high-protein meal plan to promote loss of fat mass while preserving muscle mass. Was counseled on pharmacotherapy and role as an adjunct in weight management.   Obesity Education Performed Today:  We discussed obesity as a disease and the importance of a more detailed evaluation of all the factors contributing to the disease.  We discussed the importance of long term lifestyle changes which include nutrition, exercise and behavioral modifications as well as the importance of customizing this to her specific health and social needs.  We discussed the benefits of reaching a healthier weight to alleviate the symptoms of existing conditions and reduce the risks of the biomechanical, metabolic and psychological effects of obesity.  Lori Shea appears to be in the action stage of change and states they are ready to start intensive lifestyle modifications and behavioral modifications.  ______________________________________________________________________________  She will be contacted by [facility] to set up initial appointment and the first follow up appointment with a physician.   The following office policies were discussed. She voiced understanding: - Patient will be considered late at 6 minutes past appointment time.   - For the first office visit, patient needs to arrive 1 hour early, fasting except for water. Patient should arrive 15 minutes early for all other visits. -  Patient will bring completed new patient paperwork to first office visit.  If not, appointment will be rescheduled.  30 minutes was  spent today on this visit including the above counseling, pre-visit chart review, and post-visit documentation.  Reviewed by clinician on day of visit: allergies, medications, problem list, medical history, surgical history, family history, social history, and previous encounter notes pertinent to obesity diagnosis.    Jesse Sans, FNP

## 2022-12-04 ENCOUNTER — Other Ambulatory Visit: Payer: Self-pay | Admitting: Nurse Practitioner

## 2022-12-04 ENCOUNTER — Encounter (INDEPENDENT_AMBULATORY_CARE_PROVIDER_SITE_OTHER): Payer: Self-pay | Admitting: Family Medicine

## 2022-12-04 ENCOUNTER — Encounter (HOSPITAL_COMMUNITY)
Admission: RE | Admit: 2022-12-04 | Discharge: 2022-12-04 | Disposition: A | Discharge status: Home / Self Care | Payer: 59 | Source: Ambulatory Visit | Attending: Gastroenterology | Admitting: Gastroenterology

## 2022-12-04 ENCOUNTER — Telehealth (INDEPENDENT_AMBULATORY_CARE_PROVIDER_SITE_OTHER): Payer: 59 | Admitting: Family Medicine

## 2022-12-04 DIAGNOSIS — Z6841 Body Mass Index (BMI) 40.0 and over, adult: Secondary | ICD-10-CM

## 2022-12-04 DIAGNOSIS — K760 Fatty (change of) liver, not elsewhere classified: Secondary | ICD-10-CM | POA: Diagnosis not present

## 2022-12-04 DIAGNOSIS — R110 Nausea: Secondary | ICD-10-CM | POA: Diagnosis present

## 2022-12-04 DIAGNOSIS — Z794 Long term (current) use of insulin: Secondary | ICD-10-CM

## 2022-12-04 DIAGNOSIS — Z7985 Long-term (current) use of injectable non-insulin antidiabetic drugs: Secondary | ICD-10-CM

## 2022-12-04 DIAGNOSIS — E669 Obesity, unspecified: Secondary | ICD-10-CM

## 2022-12-04 DIAGNOSIS — K30 Functional dyspepsia: Secondary | ICD-10-CM

## 2022-12-04 DIAGNOSIS — E1165 Type 2 diabetes mellitus with hyperglycemia: Secondary | ICD-10-CM | POA: Diagnosis not present

## 2022-12-04 DIAGNOSIS — Z7984 Long term (current) use of oral hypoglycemic drugs: Secondary | ICD-10-CM

## 2022-12-04 MED ORDER — TECHNETIUM TC 99M SULFUR COLLOID
2.0000 | Freq: Once | INTRAVENOUS | Status: AC
  Administered 2022-12-04: 2.11 via ORAL

## 2022-12-07 ENCOUNTER — Other Ambulatory Visit: Payer: Self-pay

## 2022-12-07 ENCOUNTER — Encounter (HOSPITAL_COMMUNITY): Payer: Self-pay | Admitting: *Deleted

## 2022-12-07 ENCOUNTER — Ambulatory Visit (HOSPITAL_COMMUNITY)
Admission: EM | Admit: 2022-12-07 | Discharge: 2022-12-07 | Disposition: A | Discharge status: Home / Self Care | Payer: 59 | Attending: Emergency Medicine | Admitting: Emergency Medicine

## 2022-12-07 ENCOUNTER — Encounter: Payer: Self-pay | Admitting: Gastroenterology

## 2022-12-07 ENCOUNTER — Ambulatory Visit (INDEPENDENT_AMBULATORY_CARE_PROVIDER_SITE_OTHER): Payer: 59

## 2022-12-07 ENCOUNTER — Encounter: Payer: Self-pay | Admitting: Nurse Practitioner

## 2022-12-07 DIAGNOSIS — R110 Nausea: Secondary | ICD-10-CM

## 2022-12-07 DIAGNOSIS — R2232 Localized swelling, mass and lump, left upper limb: Secondary | ICD-10-CM | POA: Diagnosis not present

## 2022-12-07 DIAGNOSIS — L989 Disorder of the skin and subcutaneous tissue, unspecified: Secondary | ICD-10-CM | POA: Diagnosis not present

## 2022-12-07 MED ORDER — METOCLOPRAMIDE HCL 5 MG PO TABS: 5.0000 mg | ORAL_TABLET | Freq: Two times a day (BID) | ORAL | 0 refills | Status: DC | PRN

## 2022-12-07 NOTE — ED Provider Notes (Signed)
[facility]    CSN: [id number] Arrival date & time: 12/07/22  1004      History   Chief Complaint Chief Complaint  Patient presents with   lump on ARM    HPI Lori Shea is a 47 y.o. female.   Patient presents to clinic over concern of two lumps to her left elbow.  She found these lumps yesterday and they are tender to touch. No pain with range of motion.   Denies any trauma, repetitive motion, bug bites or breaks in the skin.  She did feel little flushed yesterday, when she took her temperature temp orally was normal.  She has a history of a large cyst removed to her right breast/chest wall area.    The history is provided by the patient and medical records.    Past Medical History:  Diagnosis Date   Allergy    Foods:  mango, honey, corn.  Allergic to "everything tested"  15 years ago in [place]   Anemia    with pregnancy   Anxiety    Arthritis    Shoulder and hips   Asthma age 53 yo   Triggers:  cold weather, exercise, allergens, anxiety, URIs   Cholelithiasis age 4 yo   Asymptomatic   Closed avulsion fracture of lateral malleolus of right fibula 12/27/2016   COVID-19    Diabetes mellitus type 2 2012   Was diagnosed 2 years earlier with PCOS   Elevated liver enzymes 09/13/2016   none since 2018   GERD (gastroesophageal reflux disease)    Heart murmur    with pregnancy 20 yrs ago per pt on 10-12-2021   History of kidney stones    Hypertension 2015   Kidney stones    family history of renal failure   Migraines 2009   Morbid obesity (HCC)    PCOS (polycystic ovarian syndrome) 2010   Pneumonia    several years ago   Wears glasses     Patient Active Problem List   Diagnosis Date Noted   Chronic diarrhea 01/25/2022   History of colonic polyps 01/25/2022   Gastroesophageal reflux disease 01/25/2022   Chronic nausea 01/25/2022   Gastritis without bleeding 01/25/2022   Stress incontinence of urine 01/02/2021   Skin irritation  01/02/2021   Hepatic steatosis 12/01/2019   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/2019   Hemoglobin A1C greater than 9%, indicating poor diabetic control 03/23/2019   Hyperglycemia 03/23/2019   Class 3 severe obesity due to excess calories with serious comorbidity and body mass index (BMI) of 40.0 to 44.9 in adult (HCC) 03/23/2019   Yeast infection 03/23/2019   BMI 40.0-44.9, adult (HCC) 03/09/2019   Cervical dysplasia 07/31/2017   Alopecia of scalp 05/09/2017   Pain in left toe(s) 12/27/2016   Morbid obesity (HCC) 09/13/2016   Elevated liver enzymes 09/13/2016   Allergy    Asthma    Hypertension 04/23/2013   Irregular periods 02/22/2011   Type 2 diabetes mellitus with hyperglycemia, with long-term current use of insulin (HCC) 04/23/2010   PCOS (polycystic ovarian syndrome) 04/23/2008   Migraines 04/24/2007    Past Surgical History:  Procedure Laterality Date   CHOLECYSTECTOMY N/A 12/01/2019   Procedure: LAPAROSCOPIC CHOLECYSTECTOMY;  Surgeon: Darnell Level, MD;  Location: [facility];  Service: General;  Laterality: N/A;   colonscopy and endoscopy  05/25/2021   COLPOSCOPY N/A 2021   CYST EXCISION     DILITATION & CURRETTAGE/HYSTROSCOPY WITH NOVASURE ABLATION N/A 10/18/2021   Procedure: DILATATION & CURETTAGE/HYSTEROSCOPY  WITH NOVASURE ABLATION;  Surgeon: [place] Bing, MD;  Location: [facility];  Service: Gynecology;  Laterality: N/A;   LAPAROSCOPIC TUBAL LIGATION Bilateral 01/28/2019   Procedure: LAPAROSCOPIC TUBAL LIGATION AND PAP SMEAR;  Surgeon: [place] Bing, MD;  Location: [facility];  Service: Gynecology;  Laterality: Bilateral;   TONSILLECTOMY AND ADENOIDECTOMY Bilateral 12/23/2018   Procedure: TONSILLECTOMY AND ADENOIDECTOMY;  Surgeon: Newman Pies, MD;  Location: [facility];  Service: ENT;  Laterality: Bilateral;    OB History     Gravida  4   Para  1   Term  1   Preterm      AB  3   Living  1      SAB   3   IAB      Ectopic      Multiple      Live Births               Home Medications    Prior to Admission medications   Medication Sig Start Date End Date Taking? Authorizing Provider  albuterol (PROVENTIL) (2.5 MG/3ML) 0.083% nebulizer solution Take 3 mLs (2.5 mg total) by nebulization every 6 (six) hours as needed for wheezing or shortness of breath. 09/12/20  Yes Barbette Merino, NP  albuterol (VENTOLIN HFA) 108 (90 Base) MCG/ACT inhaler INHALE 2 PUFFS INTO THE LUNGS EVERY 4 HOURS AS NEEDED FOR FOR WHEEZING OR SHORTNESS OF BREATH 09/11/21  Yes Ivonne Andrew, NP  amitriptyline (ELAVIL) 25 MG tablet TAKE 1 TABLET BY MOUTH AT BEDTIME 10/16/22  Yes Paseda, Baird Kay, FNP  cholestyramine (QUESTRAN) 4 g packet Take 1 packet (4 g total) by mouth 2 (two) times daily. 10/02/22  Yes Mansouraty, Netty Starring., MD  Continuous Blood Gluc Sensor (FREESTYLE LIBRE 3 SENSOR) MISC Use to check glucose continuously and change sensor every 14 days. 07/25/22  Yes Reardon, Freddi Starr, NP  fluticasone-salmeterol (ADVAIR DISKUS) 100-50 MCG/ACT AEPB Inhale 1 puff into the lungs 2 (two) times daily. 10/04/22  Yes Ivonne Andrew, NP  ibuprofen (ADVIL) 600 MG tablet Take 1 tablet (600 mg total) by mouth every 6 (six) hours as needed. 10/18/21  Yes [place] Bing, MD  insulin degludec (TRESIBA FLEXTOUCH) 100 UNIT/ML FlexTouch Pen Inject 60 Units into the skin at bedtime. 10/31/22  Yes Reardon, Freddi Starr, NP  Insulin Pen Needle (PEN NEEDLES) 31G X 8 MM MISC Use to inject insulin 4 times daily 09/10/22  Yes Reardon, Alphonzo Lemmings J, NP  meloxicam (MOBIC) 7.5 MG tablet Take 1 tablet (7.5 mg total) by mouth daily. 10/23/22  Yes McCaughan, Dia D, DPM  metFORMIN (GLUCOPHAGE) 500 MG tablet TAKE 1 TABLET BY MOUTH 2 TIMES DAILY WITH MEALS 10/16/22  Yes Paseda, Folashade R, FNP  montelukast (SINGULAIR) 10 MG tablet TAKE 1 TABLET BY MOUTH EVERY DAY 10/15/22  Yes Ivonne Andrew, NP  NOVOLOG FLEXPEN 100 UNIT/ML FlexPen INJECT 10 TO 16  UNITS SUBCUTANEOUSLY 3 TIMES DAILY WITH MEALS 12/04/22  Yes Dani Gobble, NP  olmesartan-hydrochlorothiazide (BENICAR HCT) 40-25 MG tablet TAKE 1 TABLET BY MOUTH EVERY DAY 10/16/22  Yes Paseda, Folashade R, FNP  pantoprazole (PROTONIX) 40 MG tablet TAKE 1 TABLET BY MOUTH EVERY DAY 09/24/22  Yes Mansouraty, Netty Starring., MD  promethazine (PHENERGAN) 25 MG tablet Take 1 tablet (25 mg total) by mouth every 8 (eight) hours as needed. 01/24/22  Yes Mansouraty, Netty Starring., MD  rizatriptan (MAXALT-MLT) 10 MG disintegrating tablet Take 1 tablet (10 mg total) by mouth  as needed for migraine. May repeat in 2 hours if needed 11/23/22  Yes Ivonne Andrew, NP  Semaglutide (RYBELSUS) 14 MG TABS Take 1 tablet (14 mg total) by mouth daily. 11/06/22  Yes Reardon, Freddi Starr, NP  benzonatate (TESSALON) 100 MG capsule Take 1 capsule (100 mg total) by mouth every 8 (eight) hours. 08/04/22   Carlisle Beers, FNP  cyclobenzaprine (FLEXERIL) 5 MG tablet Take 1 tablet (5 mg total) by mouth 3 (three) times daily as needed for muscle spasms. 12/28/20   Barbette Merino, NP    Family History Family History  Problem Relation Age of Onset   Healthy Mother    Diabetes Father    Hyperlipidemia Father    Hypertension Father    Heart disease Father    Kidney disease Father        Developed after 2nd CABG   Colon polyps Father    Diabetes Paternal Grandfather    Heart disease Paternal Grandfather    Hyperlipidemia Paternal Grandfather    Hypertension Paternal Grandfather    Kidney disease Paternal Grandfather     Social History Social History   Tobacco Use   Smoking status: Never   Smokeless tobacco: Never  Vaping Use   Vaping status: Never Used  Substance Use Topics   Alcohol use: No   Drug use: No     Allergies   Corn-containing products, Honey, Mango flavor, Glipizide, and Liraglutide   Review of Systems Review of Systems  Constitutional:  Negative for fever.  Musculoskeletal:  Negative for joint  swelling.  Skin:  Negative for color change.     Physical Exam Triage Vital Signs ED Triage Vitals  Encounter Vitals Group     BP 12/07/22 1057 115/85     Systolic BP Percentile --      Diastolic BP Percentile --      Pulse Rate 12/07/22 1057 76     Resp 12/07/22 1057 20     Temp 12/07/22 1057 98.8 F (37.1 C)     Temp src --      SpO2 12/07/22 1057 95 %     Weight --      Height --      Head Circumference --      Peak Flow --      Pain Score 12/07/22 1052 4     Pain Loc --      Pain Education --      Exclude from Growth Chart --    No data found.  Updated Vital Signs BP 115/85   Pulse 76   Temp 98.8 F (37.1 C)   Resp 20   LMP 10/14/2021 (Exact Date)   SpO2 95%   Visual Acuity Right Eye Distance:   Left Eye Distance:   Bilateral Distance:    Right Eye Near:   Left Eye Near:    Bilateral Near:     Physical Exam Vitals and nursing note reviewed.  Constitutional:      Appearance: Normal appearance.  HENT:     Head: Normocephalic and atraumatic.     Right Ear: External ear normal.     Left Ear: External ear normal.     Nose: Nose normal.     Mouth/Throat:     Mouth: Mucous membranes are moist.  Eyes:     Conjunctiva/sclera: Conjunctivae normal.  Cardiovascular:     Rate and Rhythm: Normal rate.     Pulses: Normal pulses.  Pulmonary:     Effort:  Pulmonary effort is normal. No respiratory distress.  Musculoskeletal:       Arms:     Cervical back: Normal range of motion.     Comments: Two TTP cyst-like lesions to left inner elbow. Without fluctuance or induration. No skin changes, warmth or erythema. Afebrile.   Skin:    General: Skin is warm and dry.     Findings: Lesion present.  Neurological:     General: No focal deficit present.     Mental Status: She is alert and oriented to person, place, and time.  Psychiatric:        Mood and Affect: Mood normal.        Behavior: Behavior normal. Behavior is cooperative.      UC Treatments /  Results  Labs (all labs ordered are listed, but only abnormal results are displayed) Labs Reviewed - No data to display  EKG   Radiology No results found.  Procedures Procedures (including critical care time)  Medications Ordered in UC Medications - No data to display  Initial Impression / Assessment and Plan / UC Course  I have reviewed the triage vital signs and the nursing notes.  Pertinent labs & imaging results that were available during my care of the patient were reviewed by me and considered in my medical decision making (see chart for details).  Vitals and triage reviewed, patient is hemodynamically stable.  To cystic-like structures to her left inner elbow.  Range of motion intact without pain.  No skin changes, erythema or concerns for cellulitis.  Low risk for bursitis, no repetitive movements or trauma.  Imaging does not show acute fracture or osteo, awaiting official radiology over-read. Advised f/u w/ derm for further evaluation / US / imaging for further characterization.  Plan of care, follow-up care and return precautions given, no questions at this time.     Final Clinical Impressions(s) / UC Diagnoses   Final diagnoses:  Skin lesion     Discharge Instructions      You x-rays were unremarkable, no signs of infection or bone abnormalities. I believe you have two cystic structures that would be better evaluated with a dermatologist. They will be able to order further evaluation with ultrasound or other appropriate imaging.   Return to clinic if you develop redness, fever, streaking, or any acute changes.      ED Prescriptions   None    PDMP not reviewed this encounter.   Kristen Fromm, [place] N, [place] 12/07/22 1235

## 2022-12-07 NOTE — ED Triage Notes (Signed)
Pt reports yesterday finding e lumps on Her lt arm above her elbow.  Pt reports pain at sites when touched. Skin slightly red at largest site.

## 2022-12-07 NOTE — Telephone Encounter (Signed)
I think Reglan may be helpful then in this case with diagnosis of Gastroparesis. The Rybelsus may also still be something that we have to consider transitioning off in the future, if we cannot get things under control. Let her know black box warning of Tardive Dyskinesia is rare, more so with continued long term use but is still a risk at times with short-term use, but is the best medication available when gastroparesis is at play.  Trial Reglan 5 mg at night before bedtime and may then titrate upwards to twice daily dosing with 2nd dose in the AM around breakfast time.  Reglan 5 mg BID PRN (60/0).  She needs to come in for an EKG and CMP/Magnesium/Phosphorous next week to monitor Qtc and her electrolytes.  Let her give [place] an update in a couple of weeks to see how things go. GM

## 2022-12-07 NOTE — Telephone Encounter (Signed)
Dr Meridee Score see the message from the pt  regarding symptoms and gastric emptying scan.

## 2022-12-07 NOTE — Discharge Instructions (Signed)
You x-rays were unremarkable, no signs of infection or bone abnormalities. I believe you have two cystic structures that would be better evaluated with a dermatologist. They will be able to order further evaluation with ultrasound or other appropriate imaging.   Return to clinic if you develop redness, fever, streaking, or any acute changes.

## 2022-12-07 NOTE — Addendum Note (Signed)
Addended by: Loretha Stapler on: 12/07/2022 03:20 PM   Modules accepted: Orders

## 2022-12-10 ENCOUNTER — Ambulatory Visit: Payer: 59 | Admitting: Nurse Practitioner

## 2022-12-10 ENCOUNTER — Encounter: Payer: Self-pay | Admitting: Nurse Practitioner

## 2022-12-10 VITALS — BP 119/79 | HR 89 | Resp 16 | Ht 65.0 in | Wt 270.8 lb

## 2022-12-10 DIAGNOSIS — Z794 Long term (current) use of insulin: Secondary | ICD-10-CM

## 2022-12-10 DIAGNOSIS — R399 Unspecified symptoms and signs involving the genitourinary system: Secondary | ICD-10-CM | POA: Insufficient documentation

## 2022-12-10 DIAGNOSIS — E1165 Type 2 diabetes mellitus with hyperglycemia: Secondary | ICD-10-CM | POA: Diagnosis not present

## 2022-12-10 DIAGNOSIS — L089 Local infection of the skin and subcutaneous tissue, unspecified: Secondary | ICD-10-CM

## 2022-12-10 DIAGNOSIS — M79622 Pain in left upper arm: Secondary | ICD-10-CM | POA: Diagnosis not present

## 2022-12-10 LAB — POCT URINALYSIS DIPSTICK
Blood, UA: NEGATIVE
Glucose, UA: NEGATIVE
Leukocytes, UA: NEGATIVE
Nitrite, UA: NEGATIVE
Protein, UA: NEGATIVE
Spec Grav, UA: >=1.03 — AB (ref 1.010–1.025)
Urobilinogen, UA: 0.2 E.U./dL
pH, UA: 5.5 (ref 5.0–8.0)

## 2022-12-10 MED ORDER — DOXYCYCLINE MONOHYDRATE 50 MG PO CAPS
100.0000 mg | ORAL_CAPSULE | Freq: Two times a day (BID) | ORAL | 0 refills | Status: AC
Start: 2022-12-10 — End: 2022-12-17

## 2022-12-10 NOTE — Assessment & Plan Note (Addendum)
Feels like she has pain in her right kidney when urinating since yesterday  She denies fever, chills, blood in the urine dysuria UA negative for UTI Patient encouraged to drink at least 64 ounces of water daily to maintain hydration

## 2022-12-10 NOTE — Assessment & Plan Note (Signed)
She is followed by endocrinologist Continue metformin 500 mg twice daily, Tresiba 60 units daily Patient counseled on low-carb modified diet Encouraged to maintain close follow-up with endocrinologist

## 2022-12-10 NOTE — Progress Notes (Signed)
Acute Office Visit  Subjective:     Patient ID: Lori Shea, female    DOB: 03/09/1976, 47 y.o.   MRN: [id number]  Chief Complaint  Patient presents with   Mass    On her left arm. We to urgent care Friday, they took xray and it was fine, unsure what it is. Very tender. Was referred to derm but that office is closed down. Also has a cyst on her chest. Had one removed in the past. Thinks she may have UTI as well.     HPI Lori Shea  has a past medical history of Allergy, Anemia, Anxiety, Arthritis, Asthma (age 24 yo), Cholelithiasis (age 24 yo), Closed avulsion fracture of lateral malleolus of right fibula (12/27/2016), COVID-19, Diabetes mellitus type 2 (2012), Elevated liver enzymes (09/13/2016), GERD (gastroesophageal reflux disease), Heart murmur, History of kidney stones, Hypertension (2015), Kidney stones, Migraines (2009), Morbid obesity (HCC), PCOS (polycystic ovarian syndrome) (2010), Pneumonia, and Wears glasses.    Patient is in today for complaints of a tender knot on her left arm that started 3 days ago.  Never had this notes before.  She was at the urgent care, x-ray of the affected site was negative.  They thought she might have 2 cystic structures that would be better evaluated by dermatologist using an ultrasound.  Patient would like ultrasound ordered today.  She had a cyst on her chest removed in 2022, stated that the cyst got infected a couple of months later and was treated with antibiotics.  The site has been fine until yesterday when she noticed some redness around the scar tissue. she denies trauma fever chills malaise.    She had stopped taking Rybelsus, was on Rybelsus 14 mg daily.  States that Rybelsus was contributing to her gastroparesis.  She plans following up with endocrinologist .   Review of Systems  Constitutional:  Negative for activity change, appetite change, chills, fatigue and fever.  HENT:  Negative for congestion, dental problem, ear  discharge, ear pain, hearing loss, rhinorrhea, sinus pressure, sinus pain, sneezing and sore throat.   Eyes:  Negative for pain, discharge, redness and itching.  Respiratory:  Negative for cough, chest tightness, shortness of breath and wheezing.   Cardiovascular:  Negative for chest pain, palpitations and leg swelling.  Gastrointestinal:  Negative for abdominal distention, abdominal pain, anal bleeding, blood in stool, constipation, diarrhea, nausea, rectal pain and vomiting.  Endocrine: Negative for cold intolerance, heat intolerance, polydipsia, polyphagia and polyuria.  Genitourinary:  Negative for difficulty urinating, dysuria, flank pain, frequency, hematuria, menstrual problem, pelvic pain and vaginal bleeding.  Musculoskeletal:  Negative for arthralgias, back pain, gait problem and myalgias.  Skin:  Positive for rash. Negative for color change, pallor and wound.  Allergic/Immunologic: Negative for environmental allergies, food allergies and immunocompromised state.  Neurological:  Negative for dizziness, tremors, facial asymmetry, weakness and headaches.  Hematological:  Negative for adenopathy. Does not bruise/bleed easily.  Psychiatric/Behavioral:  Negative for agitation, behavioral problems, confusion, decreased concentration, hallucinations, self-injury and suicidal ideas.         Objective:    BP 119/79   Pulse 89   Resp 16   Ht 5\' 5"  (1.651 m)   Wt 270 lb 12.8 oz (122.8 kg)   LMP 10/14/2021 (Exact Date)   SpO2 100%   BMI 45.06 kg/m    Physical Exam Vitals and nursing note reviewed.  Constitutional:      General: She is not in acute distress.    Appearance:  Normal appearance. She is obese. She is not ill-appearing, toxic-appearing or diaphoretic.  HENT:     Mouth/Throat:     Mouth: Mucous membranes are moist.     Pharynx: Oropharynx is clear. No oropharyngeal exudate or posterior oropharyngeal erythema.  Eyes:     General: No scleral icterus.       Right eye: No  discharge.        Left eye: No discharge.     Extraocular Movements: Extraocular movements intact.     Conjunctiva/sclera: Conjunctivae normal.  Cardiovascular:     Rate and Rhythm: Normal rate and regular rhythm.     Pulses: Normal pulses.     Heart sounds: Normal heart sounds. No murmur heard.    No friction rub. No gallop.  Pulmonary:     Effort: Pulmonary effort is normal. No respiratory distress.     Breath sounds: Normal breath sounds. No stridor. No wheezing, rhonchi or rales.  Chest:     Chest wall: No tenderness.  Abdominal:     General: There is no distension.     Palpations: Abdomen is soft.     Tenderness: There is no abdominal tenderness. There is no right CVA tenderness, left CVA tenderness or guarding.  Musculoskeletal:        General: No swelling, tenderness, deformity or signs of injury.     Right lower leg: No edema.     Left lower leg: No edema.  Skin:    General: Skin is warm and dry.     Capillary Refill: Capillary refill takes less than 2 seconds.     Coloration: Skin is not jaundiced or pale.     Findings: No bruising, erythema or lesion.     Comments: Tenderness on palpation of left inner upper arm.  No redness or swelling noted Abscess.  Affected site on the anterior chest noted to be mildly red, yellowish colored pus noted.   Neurological:     Mental Status: She is alert and oriented to person, place, and time.     Motor: No weakness.     Coordination: Coordination normal.     Gait: Gait normal.  Psychiatric:        Mood and Affect: Mood normal.        Behavior: Behavior normal.        Thought Content: Thought content normal.        Judgment: Judgment normal.     Results for orders placed or performed in visit on 12/10/22  POCT urinalysis dipstick  Result Value Ref Range   Color, UA yellow    Clarity, UA clear    Glucose, UA Negative Negative   Bilirubin, UA small    Ketones, UA trace    Spec Grav, UA >=1.030 (A) 1.010 - 1.025   Blood, UA  neg    pH, UA 5.5 5.0 - 8.0   Protein, UA Negative Negative   Urobilinogen, UA 0.2 0.2 or 1.0 E.U./dL   Nitrite, UA neg    Leukocytes, UA Negative Negative   Appearance     Odor          Assessment & Plan:   Problem List Items Addressed This Visit       Endocrine   Type 2 diabetes mellitus with hyperglycemia, with long-term current use of insulin (HCC)    She is followed by endocrinologist Continue metformin 500 mg twice daily, Tresiba 60 units daily Patient counseled on low-carb modified diet Encouraged to maintain close follow-up with  endocrinologist      Relevant Orders   Microalbumin/Creatinine Ratio, Urine     Other   Left upper arm pain    Soft  tissue ultrasound ordered to rule out infected cyst       Relevant Orders   [place] LT UPPER EXTREM LTD SOFT TISSUE NON VASCULAR   Soft tissue infection     - doxycycline (MONODOX) 50 MG capsule; Take 2 capsules (100 mg total) by mouth 2 (two) times daily for 7 days.  Dispense: 28 capsule; Refill: 0  Encouraged to use warm compress as needed Follow-up in the office after completion of antibiotics to ensure resolution      Relevant Medications   doxycycline (MONODOX) 50 MG capsule   UTI symptoms - Primary    Feels like she has pain in her right kidney when urinating since yesterday  She denies fever, chills, blood in the urine dysuria UA negative for UTI Patient encouraged to drink at least 64 ounces of water daily to maintain hydration        Meds ordered this encounter  Medications   doxycycline (MONODOX) 50 MG capsule    Sig: Take 2 capsules (100 mg total) by mouth 2 (two) times daily for 7 days.    Dispense:  28 capsule    Refill:  0    Return in about 1 week (around 12/17/2022) for soft tissue infection.  Donell Beers, FNP

## 2022-12-10 NOTE — Assessment & Plan Note (Signed)
Soft  tissue ultrasound ordered to rule out infected cyst

## 2022-12-10 NOTE — Assessment & Plan Note (Signed)
-   doxycycline (MONODOX) 50 MG capsule; Take 2 capsules (100 mg total) by mouth 2 (two) times daily for 7 days.  Dispense: 28 capsule; Refill: 0  Encouraged to use warm compress as needed Follow-up in the office after completion of antibiotics to ensure resolution

## 2022-12-10 NOTE — Patient Instructions (Signed)
.   Left upper arm pain  - [place] LT UPPER EXTREM LTD SOFT TISSUE NON VASCULAR  . Soft tissue infection  - doxycycline (MONODOX) 50 MG capsule; Take 2 capsules (100 mg total) by mouth 2 (two) times daily for 7 days.  Dispense: 28 capsule; Refill: 0    It is important that you exercise regularly at least 30 minutes 5 times a week as tolerated  Think about what you will eat, plan ahead. Choose " clean, green, fresh or frozen" over canned, processed or packaged foods which are more sugary, salty and fatty. 70 to 75% of food eaten should be vegetables and fruit. Three meals at set times with snacks allowed between meals, but they must be fruit or vegetables. Aim to eat over a 12 hour period , example 7 am to 7 pm, and STOP after  your last meal of the day. Drink water,generally about 64 ounces per day, no other drink is as healthy. Fruit juice is best enjoyed in a healthy way, by EATING the fruit.  Thanks for choosing [facility] we consider it a privelige to serve you.

## 2022-12-11 ENCOUNTER — Ambulatory Visit (HOSPITAL_COMMUNITY): Payer: 59

## 2022-12-11 ENCOUNTER — Ambulatory Visit (HOSPITAL_COMMUNITY)
Admission: RE | Admit: 2022-12-11 | Discharge: 2022-12-11 | Disposition: A | Discharge status: Home / Self Care | Payer: 59 | Source: Ambulatory Visit | Attending: Gastroenterology | Admitting: Gastroenterology

## 2022-12-11 DIAGNOSIS — R110 Nausea: Secondary | ICD-10-CM | POA: Insufficient documentation

## 2022-12-11 LAB — MICROALBUMIN / CREATININE URINE RATIO
Creatinine, Urine: 313.3 mg/dL
Microalb/Creat Ratio: 9 mg/g{creat} (ref 0–29)
Microalbumin, Urine: 27.6 ug/mL

## 2022-12-13 ENCOUNTER — Ambulatory Visit (HOSPITAL_COMMUNITY)
Admission: RE | Admit: 2022-12-13 | Discharge: 2022-12-13 | Disposition: A | Discharge status: Home / Self Care | Payer: 59 | Source: Ambulatory Visit | Attending: Nurse Practitioner | Admitting: Nurse Practitioner

## 2022-12-13 ENCOUNTER — Other Ambulatory Visit (INDEPENDENT_AMBULATORY_CARE_PROVIDER_SITE_OTHER): Payer: 59

## 2022-12-13 DIAGNOSIS — R110 Nausea: Secondary | ICD-10-CM

## 2022-12-13 DIAGNOSIS — M79622 Pain in left upper arm: Secondary | ICD-10-CM | POA: Insufficient documentation

## 2022-12-13 LAB — COMPREHENSIVE METABOLIC PANEL
ALT: 24 U/L (ref 0–35)
AST: 23 U/L (ref 0–37)
Albumin: 3.8 g/dL (ref 3.5–5.2)
Alkaline Phosphatase: 77 U/L (ref 39–117)
BUN: 18 mg/dL (ref 6–23)
CO2: 23 mEq/L (ref 19–32)
Calcium: 8.9 mg/dL (ref 8.4–10.5)
Chloride: 106 mEq/L (ref 96–112)
Creatinine, Ser: 0.72 mg/dL (ref 0.40–1.20)
GFR: 98.8 mL/min (ref >60.00)
Glucose, Bld: 156 mg/dL — ABNORMAL HIGH (ref 70–99)
Potassium: 3.6 mEq/L (ref 3.5–5.1)
Sodium: 140 mEq/L (ref 135–145)
Total Bilirubin: 0.4 mg/dL (ref 0.2–1.2)
Total Protein: 6.8 g/dL (ref 6.0–8.3)

## 2022-12-13 LAB — MAGNESIUM: Magnesium: 1.5 mg/dL (ref 1.5–2.5)

## 2022-12-13 LAB — PHOSPHORUS: Phosphorus: 3.5 mg/dL (ref 2.3–4.6)

## 2022-12-18 ENCOUNTER — Other Ambulatory Visit: Payer: Self-pay | Admitting: Nurse Practitioner

## 2022-12-18 ENCOUNTER — Encounter: Payer: Self-pay | Admitting: Nurse Practitioner

## 2022-12-18 DIAGNOSIS — G4700 Insomnia, unspecified: Secondary | ICD-10-CM

## 2022-12-18 DIAGNOSIS — E119 Type 2 diabetes mellitus without complications: Secondary | ICD-10-CM

## 2022-12-19 ENCOUNTER — Other Ambulatory Visit: Payer: Self-pay | Admitting: Nurse Practitioner

## 2022-12-19 ENCOUNTER — Encounter (INDEPENDENT_AMBULATORY_CARE_PROVIDER_SITE_OTHER): Payer: 59 | Admitting: Nurse Practitioner

## 2022-12-19 ENCOUNTER — Encounter: Payer: Self-pay | Admitting: Nurse Practitioner

## 2022-12-19 DIAGNOSIS — I10 Essential (primary) hypertension: Secondary | ICD-10-CM

## 2022-12-19 DIAGNOSIS — J4530 Mild persistent asthma, uncomplicated: Secondary | ICD-10-CM

## 2022-12-19 NOTE — Progress Notes (Unsigned)
Erroneous encounter

## 2022-12-19 NOTE — Therapy (Unsigned)
OUTPATIENT PHYSICAL THERAPY LOWER EXTREMITY EVALUATION   Patient Name: Lori Shea MRN: [id number] DOB:11/15/1975, 47 y.o., female Today's Date: 12/20/2022  END OF SESSION:  PT End of Session - 12/20/22 1742     Visit Number 1    Number of Visits 12    Date for PT Re-Evaluation 02/19/23    Authorization Type [employer]    PT Start Time 1700    PT Stop Time 1745    PT Time Calculation (min) 45 min    Activity Tolerance Patient tolerated treatment well    Behavior During Therapy [facility] for tasks assessed/performed             Past Medical History:  Diagnosis Date   Allergy    Foods:  mango, honey, corn.  Allergic to "everything tested"  15 years ago in [place]   Anemia    with pregnancy   Anxiety    Arthritis    Shoulder and hips   Asthma age 67 yo   Triggers:  cold weather, exercise, allergens, anxiety, URIs   Cholelithiasis age 39 yo   Asymptomatic   Closed avulsion fracture of lateral malleolus of right fibula 12/27/2016   COVID-19    Diabetes mellitus type 2 2012   Was diagnosed 2 years earlier with PCOS   Elevated liver enzymes 09/13/2016   none since 2018   GERD (gastroesophageal reflux disease)    Heart murmur    with pregnancy 20 yrs ago per pt on 10-12-2021   History of kidney stones    Hypertension 2015   Kidney stones    family history of renal failure   Migraines 2009   Morbid obesity (HCC)    PCOS (polycystic ovarian syndrome) 2010   Pneumonia    several years ago   Wears glasses    Past Surgical History:  Procedure Laterality Date   CHOLECYSTECTOMY N/A 12/01/2019   Procedure: LAPAROSCOPIC CHOLECYSTECTOMY;  Surgeon: Darnell Level, MD;  Location: [facility];  Service: General;  Laterality: N/A;   colonscopy and endoscopy  05/25/2021   COLPOSCOPY N/A 2021   CYST EXCISION     DILITATION & CURRETTAGE/HYSTROSCOPY WITH NOVASURE ABLATION N/A 10/18/2021   Procedure: DILATATION & CURETTAGE/HYSTEROSCOPY WITH NOVASURE ABLATION;  Surgeon: [place] Bing, MD;  Location: [facility];  Service: Gynecology;  Laterality: N/A;   LAPAROSCOPIC TUBAL LIGATION Bilateral 01/28/2019   Procedure: LAPAROSCOPIC TUBAL LIGATION AND PAP SMEAR;  Surgeon: [place] Bing, MD;  Location: [facility];  Service: Gynecology;  Laterality: Bilateral;   TONSILLECTOMY AND ADENOIDECTOMY Bilateral 12/23/2018   Procedure: TONSILLECTOMY AND ADENOIDECTOMY;  Surgeon: Newman Pies, MD;  Location: [facility];  Service: ENT;  Laterality: Bilateral;   Patient Active Problem List   Diagnosis Date Noted   Left upper arm pain 12/10/2022   Soft tissue infection 12/10/2022   UTI symptoms 12/10/2022   Chronic diarrhea 01/25/2022   History of colonic polyps 01/25/2022   Gastroesophageal reflux disease 01/25/2022   Chronic nausea 01/25/2022   Gastritis without bleeding 01/25/2022   Stress incontinence of urine 01/02/2021   Skin irritation 01/02/2021   Hepatic steatosis 12/01/2019   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/2019   Hemoglobin A1C greater than 9%, indicating poor diabetic control 03/23/2019   Hyperglycemia 03/23/2019   Class 3 severe obesity due to excess calories with serious comorbidity and body mass index (BMI) of 40.0 to 44.9 in adult (HCC) 03/23/2019   Yeast infection 03/23/2019   BMI 40.0-44.9, adult (HCC) 03/09/2019  Cervical dysplasia 07/31/2017   Alopecia of scalp 05/09/2017   Pain in left toe(s) 12/27/2016   Morbid obesity (HCC) 09/13/2016   Elevated liver enzymes 09/13/2016   Allergy    Asthma    Hypertension 04/23/2013   Irregular periods 02/22/2011   Type 2 diabetes mellitus with hyperglycemia, with long-term current use of insulin (HCC) 04/23/2010   PCOS (polycystic ovarian syndrome) 04/23/2008   Migraines 04/24/2007    PCP: Ivonne Andrew, NP   REFERRING PROVIDER: Clerance Lav, DPM  REFERRING DIAG: [phone] (ICD-10-CM) - Achilles tendonitis, bilateral  THERAPY DIAG:   Achilles tendinitis of both lower extremities  Other abnormalities of gait and mobility  Muscle weakness (generalized)  Rationale for Evaluation and Treatment: Rehabilitation  ONSET DATE: chronic  SUBJECTIVE:   SUBJECTIVE STATEMENT: History of L achilles tendinitis, immobilized for a brief period of time, tried to wean off but symptoms have returned.   PERTINENT HISTORY:  Foot Problem      Pt states she I just sore right now no actual pain, she is here for diabetic exam and achilles tendinitis follow up.    HPI: 47 y.o. female presenting today for follow-up of left Achilles tendinitis.  She notes that she was doing pretty well but recently had a flareup.  She wears her cam walker when it is painful and has improvement of her symptoms.   Patient is diabetic.  She denies any numbness or burning in her feet. PAIN:  Are you having pain? Yes: NPRS scale: 0-8/10 Pain location: L heel Pain description: ache  Aggravating factors: prolonged walking Relieving factors: rest. Ice compression   PRECAUTIONS: None  RED FLAGS: None   WEIGHT BEARING RESTRICTIONS: No  FALLS:  Has patient fallen in last 6 months? No  OCCUPATION: CNA  PLOF: Independent  PATIENT GOALS: To manage my ankle pain  NEXT MD VISIT: 6 weeks  OBJECTIVE:   DIAGNOSTIC FINDINGS: none  PATIENT SURVEYS:  FOTO 38(60 predicted)  MUSCLE LENGTH: N/A  POSTURE:  pes planus  PALPATION: TTP lateral achilles insertion into calcaneus.  LOWER EXTREMITY ROM:  A/PROM Right eval Left eval  Hip flexion    Hip extension    Hip abduction    Hip adduction    Hip internal rotation    Hip external rotation    Knee flexion    Knee extension    Ankle dorsiflexion 12/15d 10/12dP!  Ankle plantarflexion WNL WNL  Ankle inversion WNL WNL  Ankle eversion WNL WNL   (Blank rows = not tested)  LOWER EXTREMITY MMT:  MMT Right eval Left eval  Hip flexion    Hip extension    Hip abduction    Hip adduction     Hip internal rotation    Hip external rotation    Knee flexion    Knee extension    Ankle dorsiflexion 5 4-  Ankle plantarflexion 5 4-  Ankle inversion 5 5  Ankle eversion 5 5   (Blank rows = not tested)  LOWER EXTREMITY SPECIAL TESTS:  Ankle special tests: Anterior drawer test: negative and Homan's test: negative  FUNCTIONAL TESTS:  5 times sit to stand: 10s arms crossed  GAIT: Distance walked: 50ft x2 Assistive device utilized: None Level of assistance: Complete Independence Comments: unremarkable   TODAY'S TREATMENT:  DATE: 12/20/22 Eval    PATIENT EDUCATION:  Education details: Discussed eval findings, rehab rationale and POC and patient is in agreement  Person educated: Patient Education method: Explanation Education comprehension: verbalized understanding and needs further education  HOME EXERCISE PROGRAM: Discussed SLS 30s hold B, heel raise B 1/5 s con/ecc hold  ASSESSMENT:  CLINICAL IMPRESSION: Patient is a 47 y.o. female who was seen today for physical therapy evaluation and treatment for chronic L achilles tendinitis. Symptoms have been present over a year.  Has been immobilized with temporary relief of symptoms.  Patient presents with good SLS times, only mild ROM and strength deficits, functional STS time with mild pes planus identified.  Patient is a good candidate for OPPT.  OBJECTIVE IMPAIRMENTS: Abnormal gait, decreased activity tolerance, decreased balance, decreased endurance, decreased knowledge of condition, decreased mobility, difficulty walking, decreased strength, obesity, and pain.   ACTIVITY LIMITATIONS: carrying, lifting, squatting, and stairs  PERSONAL FACTORS: Age, Education, Fitness, Time since onset of injury/illness/exacerbation, and 1 comorbidity: DM  are also affecting patient's functional outcome.   REHAB  POTENTIAL: Good  CLINICAL DECISION MAKING: Stable/uncomplicated  EVALUATION COMPLEXITY: Low   GOALS: Goals reviewed with patient? No  SHORT TERM GOALS: Target date: 01/10/2023   Patient to demonstrate independence in HEP  Baseline: TBD Goal status: INITIAL  2.  Decrease worst pain to 6/10 Baseline: 8/10 Goal status: INITIAL   LONG TERM GOALS: Target date: 01/31/2023   4/10 worst pain Baseline: 8/10 worst pain Goal status: INITIAL  2.  Increase FOTO Score to 60 Baseline: 38 Goal status: INITIAL  3.  Increase PROM L DF to 15d Baseline:  A/PROM Right eval Left eval  Hip flexion    Hip extension    Hip abduction    Hip adduction    Hip internal rotation    Hip external rotation    Knee flexion    Knee extension    Ankle dorsiflexion 12/15d 10/12dP!  Ankle plantarflexion WNL WNL  Ankle inversion WNL WNL  Ankle eversion WNL WNL   Goal status: INITIAL  4.  Increase L ankl PF/DF strength to 4/5 Baseline:  MMT Right eval Left eval  Hip flexion    Hip extension    Hip abduction    Hip adduction    Hip internal rotation    Hip external rotation    Knee flexion    Knee extension    Ankle dorsiflexion 5 4-  Ankle plantarflexion 5 4-  Ankle inversion 5 5  Ankle eversion 5 5   Goal status: INITIAL     PLAN:  PT FREQUENCY: 1-2x/week  PT DURATION: 6 weeks  PLANNED INTERVENTIONS: Therapeutic exercises, Therapeutic activity, Neuromuscular re-education, Balance training, Gait training, Patient/Family education, Self Care, Joint mobilization, Stair training, DME instructions, Aquatic Therapy, Dry Needling, Manual therapy, and Re-evaluation  PLAN FOR NEXT SESSION: HEP review and update, manual techniques as appropriate, aerobic tasks, ROM and flexibility activities, strengthening and PREs, TPDN, gait and balance training as needed     Hildred Laser, PT 12/20/2022, 5:43 PM   Check all possible CPT codes: 16109 - PT Re-evaluation, 97110- Therapeutic  Exercise, [phone]- Neuro Re-education, [phone] - Gait Training, [phone] - Manual Therapy, 97530 - Therapeutic Activities, 97535 - Self Care, [phone] - Iontophoresis, 97035 - Ultrasound, and [id number] - Aquatic therapy    Check all conditions that are expected to impact treatment: {Conditions expected to impact treatment:Diabetes mellitus   If treatment provided at initial evaluation, no treatment charged due to lack  of authorization.

## 2022-12-20 ENCOUNTER — Other Ambulatory Visit: Payer: Self-pay

## 2022-12-20 ENCOUNTER — Ambulatory Visit: Payer: 59 | Attending: Podiatry

## 2022-12-20 DIAGNOSIS — R2689 Other abnormalities of gait and mobility: Secondary | ICD-10-CM | POA: Diagnosis present

## 2022-12-20 DIAGNOSIS — M7662 Achilles tendinitis, left leg: Secondary | ICD-10-CM | POA: Insufficient documentation

## 2022-12-20 DIAGNOSIS — M7661 Achilles tendinitis, right leg: Secondary | ICD-10-CM | POA: Diagnosis present

## 2022-12-20 DIAGNOSIS — M6281 Muscle weakness (generalized): Secondary | ICD-10-CM | POA: Diagnosis present

## 2022-12-20 MED ORDER — SITAGLIPTIN PHOSPHATE 50 MG PO TABS: 50.0000 mg | ORAL_TABLET | Freq: Every day | ORAL | 1 refills | Status: DC

## 2022-12-21 NOTE — Telephone Encounter (Signed)
Please advise Kh 

## 2022-12-28 ENCOUNTER — Other Ambulatory Visit: Payer: Self-pay | Admitting: Nurse Practitioner

## 2022-12-28 DIAGNOSIS — L729 Follicular cyst of the skin and subcutaneous tissue, unspecified: Secondary | ICD-10-CM

## 2022-12-28 DIAGNOSIS — M79622 Pain in left upper arm: Secondary | ICD-10-CM

## 2023-01-01 ENCOUNTER — Encounter: Payer: Self-pay | Admitting: Gastroenterology

## 2023-01-01 NOTE — Telephone Encounter (Signed)
I am glad to hear that she has had improvement. I think if she feels that Reglan has been helpful, I would continue this medication as needed. She can use Reglan with each meal vs use it 1-2 times daily and if only once daily she should take it at bedtime. She can be set up for followup in clinic with myself or APP. Thanks. GM

## 2023-01-08 ENCOUNTER — Ambulatory Visit: Payer: 59

## 2023-01-08 ENCOUNTER — Other Ambulatory Visit: Payer: Self-pay

## 2023-01-08 ENCOUNTER — Encounter: Payer: Self-pay | Admitting: Gastroenterology

## 2023-01-08 MED ORDER — PANTOPRAZOLE SODIUM 40 MG PO TBEC: 40.0000 mg | DELAYED_RELEASE_TABLET | Freq: Two times a day (BID) | ORAL | 3 refills | Status: DC

## 2023-01-08 NOTE — Telephone Encounter (Signed)
Patient may trial twice daily Protonix versus if she wants a short course of Carafate therapy. If she wants to trial the Protonix then she can just double up. If she wants the Carafate therapy then I would do 1 g tablet twice daily in addition to her daily Protonix. Thanks. GM

## 2023-01-11 ENCOUNTER — Telehealth: Payer: Self-pay

## 2023-01-11 NOTE — Telephone Encounter (Signed)
TC regarding changing appointment time on 10/23, VM left.

## 2023-01-12 ENCOUNTER — Ambulatory Visit: Payer: 59 | Attending: Podiatry

## 2023-01-12 DIAGNOSIS — M6281 Muscle weakness (generalized): Secondary | ICD-10-CM | POA: Diagnosis present

## 2023-01-12 DIAGNOSIS — M7662 Achilles tendinitis, left leg: Secondary | ICD-10-CM | POA: Diagnosis present

## 2023-01-12 DIAGNOSIS — R2689 Other abnormalities of gait and mobility: Secondary | ICD-10-CM | POA: Insufficient documentation

## 2023-01-12 DIAGNOSIS — M7661 Achilles tendinitis, right leg: Secondary | ICD-10-CM | POA: Diagnosis present

## 2023-01-12 NOTE — Therapy (Signed)
OUTPATIENT PHYSICAL THERAPY TREATMENT NOTE   Patient Name: Lori Shea MRN: [id number] DOB:01-Sep-1975, 47 y.o., female Today's Date: 01/12/2023  END OF SESSION:  PT End of Session - 01/12/23 0946     Visit Number 2    Number of Visits 12    Date for PT Re-Evaluation 02/19/23    Authorization Type [employer]    PT Start Time [phone]    PT Stop Time 1028    PT Time Calculation (min) 41 min    Activity Tolerance Patient tolerated treatment well    Behavior During Therapy [facility] for tasks assessed/performed              Past Medical History:  Diagnosis Date   Allergy    Foods:  mango, honey, corn.  Allergic to "everything tested"  15 years ago in [place]   Anemia    with pregnancy   Anxiety    Arthritis    Shoulder and hips   Asthma age 73 yo   Triggers:  cold weather, exercise, allergens, anxiety, URIs   Cholelithiasis age 35 yo   Asymptomatic   Closed avulsion fracture of lateral malleolus of right fibula 12/27/2016   COVID-19    Diabetes mellitus type 2 2012   Was diagnosed 2 years earlier with PCOS   Elevated liver enzymes 09/13/2016   none since 2018   GERD (gastroesophageal reflux disease)    Heart murmur    with pregnancy 20 yrs ago per pt on 10-12-2021   History of kidney stones    Hypertension 2015   Kidney stones    family history of renal failure   Migraines 2009   Morbid obesity (HCC)    PCOS (polycystic ovarian syndrome) 2010   Pneumonia    several years ago   Wears glasses    Past Surgical History:  Procedure Laterality Date   CHOLECYSTECTOMY N/A 12/01/2019   Procedure: LAPAROSCOPIC CHOLECYSTECTOMY;  Surgeon: Darnell Level, MD;  Location: [facility];  Service: General;  Laterality: N/A;   colonscopy and endoscopy  05/25/2021   COLPOSCOPY N/A 2021   CYST EXCISION     DILITATION & CURRETTAGE/HYSTROSCOPY WITH NOVASURE ABLATION N/A 10/18/2021   Procedure: DILATATION & CURETTAGE/HYSTEROSCOPY WITH NOVASURE ABLATION;  Surgeon: [place]coy Bing, MD;   Location:  S[facility]ERY [facility];  Service: Gynecology;  Laterality: N/A;   LAPAROSCOPIC TUBAL LIGATION Bilateral 01/28/2019   Procedure: LAPAROSCOPIC TUBAL LIGATION AND PAP SMEAR;  Surgeon:  Bing, MD;  Location: [facility];  Service: Gynecology;  Laterality: Bilateral;   TONSILLECTOMY AND ADENOIDECTOMY Bilateral 12/23/2018   Procedure: TONSILLECTOMY AND ADENOIDECTOMY;  Surgeon: Newman Pies, MD;  Location: [facility];  Service: ENT;  Laterality: Bilateral;   Patient Active Problem List   Diagnosis Date Noted   Left upper arm pain 12/10/2022   Soft tissue infection 12/10/2022   UTI symptoms 12/10/2022   Chronic diarrhea 01/25/2022   History of colonic polyps 01/25/2022   Gastroesophageal reflux disease 01/25/2022   Chronic nausea 01/25/2022   Gastritis without bleeding 01/25/2022   Stress incontinence of urine 01/02/2021   Skin irritation 01/02/2021   Hepatic steatosis 12/01/2019   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/2019   Hemoglobin A1C greater than 9%, indicating poor diabetic control 03/23/2019   Hyperglycemia 03/23/2019   Class 3 severe obesity due to excess calories with serious comorbidity and body mass index (BMI) of 40.0 to 44.9 in adult (HCC) 03/23/2019   Yeast infection 03/23/2019   BMI 40.0-44.9, adult (HCC) 03/09/2019  Cervical dysplasia 07/31/2017   Alopecia of scalp 05/09/2017   Pain in left toe(s) 12/27/2016   Morbid obesity (HCC) 09/13/2016   Elevated liver enzymes 09/13/2016   Allergy    Asthma    Hypertension 04/23/2013   Irregular periods 02/22/2011   Type 2 diabetes mellitus with hyperglycemia, with long-term current use of insulin (HCC) 04/23/2010   PCOS (polycystic ovarian syndrome) 04/23/2008   Migraines 04/24/2007    PCP: Ivonne Andrew, NP   REFERRING PROVIDER: Clerance Lav, DPM  REFERRING DIAG: [phone] (ICD-10-CM) - Achilles tendonitis, bilateral  THERAPY DIAG:  Achilles  tendinitis of both lower extremities  Other abnormalities of gait and mobility  Muscle weakness (generalized)  Rationale for Evaluation and Treatment: Rehabilitation  ONSET DATE: chronic  SUBJECTIVE:   SUBJECTIVE STATEMENT:  Patient reports no significant changes in symptoms since evaluation. Since then she states that the only change was a gastroparesis diagnoses. She continue to have some pain with prolonged walking at work.     History of L achilles tendinitis, immobilized for a brief period of time, tried to wean off but symptoms have returned.   PERTINENT HISTORY:  Foot Problem      Pt states she I just sore right now no actual pain, she is here for diabetic exam and achilles tendinitis follow up.    HPI: 47 y.o. female presenting today for follow-up of left Achilles tendinitis.  She notes that she was doing pretty well but recently had a flareup.  She wears her cam walker when it is painful and has improvement of her symptoms.   Patient is diabetic.  She denies any numbness or burning in her feet. PAIN:  Are you having pain? Yes: NPRS scale: 0-8/10 Pain location: L heel Pain description: ache  Aggravating factors: prolonged walking Relieving factors: rest. Ice compression   PRECAUTIONS: None  RED FLAGS: None   WEIGHT BEARING RESTRICTIONS: No  FALLS:  Has patient fallen in last 6 months? No  OCCUPATION: CNA  PLOF: Independent  PATIENT GOALS: To manage my ankle pain  NEXT MD VISIT: 6 weeks  OBJECTIVE:   DIAGNOSTIC FINDINGS: none  PATIENT SURVEYS:  FOTO 38(60 predicted)  MUSCLE LENGTH: N/A  POSTURE:  pes planus  PALPATION: TTP lateral achilles insertion into calcaneus.  LOWER EXTREMITY ROM:  A/PROM Right eval Left eval  Hip flexion    Hip extension    Hip abduction    Hip adduction    Hip internal rotation    Hip external rotation    Knee flexion    Knee extension    Ankle dorsiflexion 12/15d 10/12dP!  Ankle plantarflexion WNL WNL   Ankle inversion WNL WNL  Ankle eversion WNL WNL   (Blank rows = not tested)  LOWER EXTREMITY MMT:  MMT Right eval Left eval  Hip flexion    Hip extension    Hip abduction    Hip adduction    Hip internal rotation    Hip external rotation    Knee flexion    Knee extension    Ankle dorsiflexion 5 4-  Ankle plantarflexion 5 4-  Ankle inversion 5 5  Ankle eversion 5 5   (Blank rows = not tested)  LOWER EXTREMITY SPECIAL TESTS:  Ankle special tests: Anterior drawer test: negative and Homan's test: negative  FUNCTIONAL TESTS:  5 times sit to stand: 10s arms crossed  GAIT: Distance walked: 8ft x2 Assistive device utilized: None Level of assistance: Complete Independence Comments: unremarkable   TODAY'S TREATMENT:  [facility] Adult PT Treatment:                                                DATE: 01/12/2023  Therapeutic Exercise: NuStep, 5 min level 4 BAPS board - PF/DF, supination/pronation, CW, CCW x 30 sec each  Seated Heel/Toe Raises on airex, 2 x 10  Sit to stand, 2 x 10 on airex  SLS, 2 x 30sec ankle isometric  Tandem stance on airex, 2 x 30 sec each  Seated calf stretch with strap, 3 x 30 sec    PATIENT EDUCATION:  Education details: Discussed eval findings, rehab rationale and POC and patient is in agreement  Person educated: Patient Education method: Explanation Education comprehension: verbalized understanding and needs further education  HOME EXERCISE PROGRAM: Discussed SLS 30s hold B, heel raise B 1/5 s con/ecc hold  ASSESSMENT:  CLINICAL IMPRESSION: Patient reporting some relief of tightness and pain with seated calf stretch today. She had some difficulty with BAPS ankle AAROM, but had improved ROM and control with intermittent cueing. She will benefit from ongoing progression of standing exercises at this time. Plan to reassess  response to today's session and progress within POC.    OBJECTIVE IMPAIRMENTS: Abnormal gait, decreased activity tolerance, decreased balance, decreased endurance, decreased knowledge of condition, decreased mobility, difficulty walking, decreased strength, obesity, and pain.   ACTIVITY LIMITATIONS: carrying, lifting, squatting, and stairs  PERSONAL FACTORS: Age, Education, Fitness, Time since onset of injury/illness/exacerbation, and 1 comorbidity: DM  are also affecting patient's functional outcome.   REHAB POTENTIAL: Good  CLINICAL DECISION MAKING: Stable/uncomplicated  EVALUATION COMPLEXITY: Low   GOALS: Goals reviewed with patient? No  SHORT TERM GOALS: Target date: 01/10/2023   Patient to demonstrate independence in HEP  Baseline: TBD Goal status: INITIAL  2.  Decrease worst pain to 6/10 Baseline: 8/10 Goal status: INITIAL   LONG TERM GOALS: Target date: 01/31/2023   4/10 worst pain Baseline: 8/10 worst pain Goal status: INITIAL  2.  Increase FOTO Score to 60 Baseline: 38 Goal status: INITIAL  3.  Increase PROM L DF to 15d Baseline:  A/PROM Right eval Left eval  Hip flexion    Hip extension    Hip abduction    Hip adduction    Hip internal rotation    Hip external rotation    Knee flexion    Knee extension    Ankle dorsiflexion 12/15d 10/12dP!  Ankle plantarflexion WNL WNL  Ankle inversion WNL WNL  Ankle eversion WNL WNL   Goal status: INITIAL  4.  Increase L ankl PF/DF strength to 4/5 Baseline:  MMT Right eval Left eval  Hip flexion    Hip extension    Hip abduction    Hip adduction    Hip internal rotation    Hip external rotation    Knee flexion    Knee extension    Ankle dorsiflexion 5 4-  Ankle plantarflexion 5 4-  Ankle inversion 5 5  Ankle eversion 5 5   Goal status: INITIAL     PLAN:  PT FREQUENCY: 1-2x/week  PT DURATION: 6 weeks  PLANNED INTERVENTIONS: Therapeutic exercises, Therapeutic activity, Neuromuscular  re-education, Balance training, Gait training, Patient/Family education, Self Care, Joint mobilization, Stair training, DME instructions, Aquatic Therapy, Dry Needling, Manual therapy, and Re-evaluation  PLAN FOR NEXT SESSION: HEP review and update, manual techniques as appropriate,  aerobic tasks, ROM and flexibility activities, strengthening and PREs, TPDN, gait and balance training as needed     Mauri Reading, PT, DPT 01/12/2023, 11:53 AM   Check all possible CPT codes: 78295 - PT Re-evaluation, 97110- Therapeutic Exercise, [phone]- Neuro Re-education, [phone] - Gait Training, [phone] - Manual Therapy, [phone] - Therapeutic Activities, [phone] - Self Care, [phone] - Iontophoresis, [phone] - Ultrasound, and [id number] - Aquatic therapy    Check all conditions that are expected to impact treatment: {Conditions expected to impact treatment:Diabetes mellitus   If treatment provided at initial evaluation, no treatment charged due to lack of authorization.

## 2023-01-15 ENCOUNTER — Ambulatory Visit: Payer: 59

## 2023-01-15 DIAGNOSIS — R2689 Other abnormalities of gait and mobility: Secondary | ICD-10-CM

## 2023-01-15 DIAGNOSIS — M7661 Achilles tendinitis, right leg: Secondary | ICD-10-CM | POA: Diagnosis not present

## 2023-01-15 DIAGNOSIS — M6281 Muscle weakness (generalized): Secondary | ICD-10-CM

## 2023-01-15 NOTE — Therapy (Signed)
OUTPATIENT PHYSICAL THERAPY TREATMENT NOTE   Patient Name: Lori Shea MRN: [id number] DOB:[birth year], 47 y.o., female Today's Date: 01/15/2023  END OF SESSION:  PT End of Session - 01/15/23 1745     Visit Number 3    Number of Visits 12    Date for PT Re-Evaluation 02/19/23    Authorization Type [employer]    PT Start Time 1745    PT Stop Time 1823    PT Time Calculation (min) 38 min    Activity Tolerance Patient tolerated treatment well    Behavior During Therapy [facility] for tasks assessed/performed               Past Medical History:  Diagnosis Date   Allergy    Foods:  mango, honey, corn.  Allergic to "everything tested"  15 years ago in [place]   Anemia    with pregnancy   Anxiety    Arthritis    Shoulder and hips   Asthma age 41 yo   Triggers:  cold weather, exercise, allergens, anxiety, URIs   Cholelithiasis age 47 yo   Asymptomatic   Closed avulsion fracture of lateral malleolus of right fibula 12/27/2016   COVID-19    Diabetes mellitus type 2 2012   Was diagnosed 2 years earlier with PCOS   Elevated liver enzymes 09/13/2016   none since 2018   GERD (gastroesophageal reflux disease)    Heart murmur    with pregnancy 20 yrs ago per pt on 10-12-2021   History of kidney stones    Hypertension 2015   Kidney stones    family history of renal failure   Migraines 2009   Morbid obesity (HCC)    PCOS (polycystic ovarian syndrome) 2010   Pneumonia    several years ago   Wears glasses    Past Surgical History:  Procedure Laterality Date   CHOLECYSTECTOMY N/A 12/01/2019   Procedure: LAPAROSCOPIC CHOLECYSTECTOMY;  Surgeon: Darnell Level, MD;  Location: [facility];  Service: General;  Laterality: N/A;   colonscopy and endoscopy  05/25/2021   COLPOSCOPY N/A 2021   CYST EXCISION     DILITATION & CURRETTAGE/HYSTROSCOPY WITH NOVASURE ABLATION N/A 10/18/2021   Procedure: DILATATION & CURETTAGE/HYSTEROSCOPY WITH NOVASURE ABLATION;  Surgeon: [place] Bing, MD;   Location: [facility];  Service: Gynecology;  Laterality: N/A;   LAPAROSCOPIC TUBAL LIGATION Bilateral 01/28/2019   Procedure: LAPAROSCOPIC TUBAL LIGATION AND PAP SMEAR;  Surgeon: [place] Bing, MD;  Location: [facility];  Service: Gynecology;  Laterality: Bilateral;   TONSILLECTOMY AND ADENOIDECTOMY Bilateral 12/23/2018   Procedure: TONSILLECTOMY AND ADENOIDECTOMY;  Surgeon: Newman Pies, MD;  Location: [facility];  Service: ENT;  Laterality: Bilateral;   Patient Active Problem List   Diagnosis Date Noted   Left upper arm pain 12/10/2022   Soft tissue infection 12/10/2022   UTI symptoms 12/10/2022   Chronic diarrhea 01/25/2022   History of colonic polyps 01/25/2022   Gastroesophageal reflux disease 01/25/2022   Chronic nausea 01/25/2022   Gastritis without bleeding 01/25/2022   Stress incontinence of urine 01/02/2021   Skin irritation 01/02/2021   Hepatic steatosis 12/01/2019   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/2019   Hemoglobin A1C greater than 9%, indicating poor diabetic control 03/23/2019   Hyperglycemia 03/23/2019   Class 3 severe obesity due to excess calories with serious comorbidity and body mass index (BMI) of 40.0 to 44.9 in adult (HCC) 03/23/2019   Yeast infection 03/23/2019   BMI 40.0-44.9, adult (HCC) 03/09/2019  Cervical dysplasia 07/31/2017   Alopecia of scalp 05/09/2017   Pain in left toe(s) 12/27/2016   Morbid obesity (HCC) 09/13/2016   Elevated liver enzymes 09/13/2016   Allergy    Asthma    Hypertension 04/23/2013   Irregular periods 02/22/2011   Type 2 diabetes mellitus with hyperglycemia, with long-term current use of insulin (HCC) 04/23/2010   PCOS (polycystic ovarian syndrome) 04/23/2008   Migraines 04/24/2007    PCP: Ivonne Andrew, NP   REFERRING PROVIDER: Clerance Lav, DPM  REFERRING DIAG: [phone] (ICD-10-CM) - Achilles tendonitis, bilateral  THERAPY DIAG:  Achilles  tendinitis of both lower extremities  Other abnormalities of gait and mobility  Muscle weakness (generalized)  Rationale for Evaluation and Treatment: Rehabilitation  ONSET DATE: chronic  SUBJECTIVE:   SUBJECTIVE STATEMENT: Patient reports soreness in her calves after previous session but that this did not linger. She walked 2.5 miles yesterday and did not have any pain or soreness from this.   PERTINENT HISTORY:  Foot Problem      Pt states she I just sore right now no actual pain, she is here for diabetic exam and achilles tendinitis follow up.    HPI: 47 y.o. female presenting today for follow-up of left Achilles tendinitis.  She notes that she was doing pretty well but recently had a flareup.  She wears her cam walker when it is painful and has improvement of her symptoms.   Patient is diabetic.  She denies any numbness or burning in her feet. PAIN:  Are you having pain? Yes: NPRS scale: 0-8/10 Pain location: L heel Pain description: ache  Aggravating factors: prolonged walking Relieving factors: rest. Ice compression   PRECAUTIONS: None  RED FLAGS: None   WEIGHT BEARING RESTRICTIONS: No  FALLS:  Has patient fallen in last 6 months? No  OCCUPATION: CNA  PLOF: Independent  PATIENT GOALS: To manage my ankle pain  NEXT MD VISIT: 6 weeks  OBJECTIVE:   DIAGNOSTIC FINDINGS: none  PATIENT SURVEYS:  FOTO 38(60 predicted)  MUSCLE LENGTH: N/A  POSTURE:  pes planus  PALPATION: TTP lateral achilles insertion into calcaneus.  LOWER EXTREMITY ROM:  A/PROM Right eval Left eval  Hip flexion    Hip extension    Hip abduction    Hip adduction    Hip internal rotation    Hip external rotation    Knee flexion    Knee extension    Ankle dorsiflexion 12/15d 10/12dP!  Ankle plantarflexion WNL WNL  Ankle inversion WNL WNL  Ankle eversion WNL WNL   (Blank rows = not tested)  LOWER EXTREMITY MMT:  MMT Right eval Left eval  Hip flexion    Hip extension     Hip abduction    Hip adduction    Hip internal rotation    Hip external rotation    Knee flexion    Knee extension    Ankle dorsiflexion 5 4-  Ankle plantarflexion 5 4-  Ankle inversion 5 5  Ankle eversion 5 5   (Blank rows = not tested)  LOWER EXTREMITY SPECIAL TESTS:  Ankle special tests: Anterior drawer test: negative and Homan's test: negative  FUNCTIONAL TESTS:  5 times sit to stand: 10s arms crossed  GAIT: Distance walked: 69ft x2 Assistive device utilized: None Level of assistance: Complete Independence Comments: unremarkable   TODAY'S TREATMENT:                    [facility] Adult PT Treatment:  DATE: 01/15/23 Therapeutic Exercise: NuStep, 5 min level 5 Slant board gastroc stretch 2x30" BAPS board - PF/DF, supination/pronation, CW, CCW x 30 sec each  Seated Heel/Toe Raises on airex, 2 x 10  Sit to stand, 2 x 10 on airex  Standing heel raise 2x10 Standing toe raise 2x10 Neuromuscular Re-ed: SLS, 2 x 30sec ankle isometric  Tandem stance on airex, 2 x 30 sec each  Tandem walking on airex balance beam fwd/bwd                                                                                 [facility] Adult PT Treatment:                                                DATE: 01/12/2023  Therapeutic Exercise: NuStep, 5 min level 4 BAPS board - PF/DF, supination/pronation, CW, CCW x 30 sec each  Seated Heel/Toe Raises on airex, 2 x 10  Sit to stand, 2 x 10 on airex  SLS, 2 x 30sec ankle isometric  Tandem stance on airex, 2 x 30 sec each  Seated calf stretch with strap, 3 x 30 sec    PATIENT EDUCATION:  Education details: Discussed eval findings, rehab rationale and POC and patient is in agreement  Person educated: Patient Education method: Explanation Education comprehension: verbalized understanding and needs further education  HOME EXERCISE PROGRAM: Discussed SLS 30s hold B, heel raise B 1/5 s con/ecc  hold  ASSESSMENT:  CLINICAL IMPRESSION: Patient presents to PT reporting no current pain, only mild soreness in BIL calves after last session. Session today continued to focus on BIL LE strengthening and balance tasks to challenge proprioception. She was able tolerate more standing exercises today with no increase in pain, only reported muscle fatigue. Patient was able to tolerate all prescribed exercises with no adverse effects. Patient continues to benefit from skilled PT services and should be progressed as able to improve functional independence.    OBJECTIVE IMPAIRMENTS: Abnormal gait, decreased activity tolerance, decreased balance, decreased endurance, decreased knowledge of condition, decreased mobility, difficulty walking, decreased strength, obesity, and pain.   ACTIVITY LIMITATIONS: carrying, lifting, squatting, and stairs  PERSONAL FACTORS: Age, Education, Fitness, Time since onset of injury/illness/exacerbation, and 1 comorbidity: DM  are also affecting patient's functional outcome.   REHAB POTENTIAL: Good  CLINICAL DECISION MAKING: Stable/uncomplicated  EVALUATION COMPLEXITY: Low   GOALS: Goals reviewed with patient? No  SHORT TERM GOALS: Target date: 01/10/2023   Patient to demonstrate independence in HEP  Baseline: TBD Goal status: MET  2.  Decrease worst pain to 6/10 Baseline: 8/10 Goal status: Ongoing   LONG TERM GOALS: Target date: 01/31/2023   4/10 worst pain Baseline: 8/10 worst pain Goal status: INITIAL  2.  Increase FOTO Score to 60 Baseline: 38 Goal status: INITIAL  3.  Increase PROM L DF to 15d Baseline:  A/PROM Right eval Left eval  Hip flexion    Hip extension    Hip abduction    Hip adduction    Hip internal rotation  Hip external rotation    Knee flexion    Knee extension    Ankle dorsiflexion 12/15d 10/12dP!  Ankle plantarflexion WNL WNL  Ankle inversion WNL WNL  Ankle eversion WNL WNL   Goal status: INITIAL  4.   Increase L ankl PF/DF strength to 4/5 Baseline:  MMT Right eval Left eval  Hip flexion    Hip extension    Hip abduction    Hip adduction    Hip internal rotation    Hip external rotation    Knee flexion    Knee extension    Ankle dorsiflexion 5 4-  Ankle plantarflexion 5 4-  Ankle inversion 5 5  Ankle eversion 5 5   Goal status: INITIAL     PLAN:  PT FREQUENCY: 1-2x/week  PT DURATION: 6 weeks  PLANNED INTERVENTIONS: Therapeutic exercises, Therapeutic activity, Neuromuscular re-education, Balance training, Gait training, Patient/Family education, Self Care, Joint mobilization, Stair training, DME instructions, Aquatic Therapy, Dry Needling, Manual therapy, and Re-evaluation  PLAN FOR NEXT SESSION: HEP review and update, manual techniques as appropriate, aerobic tasks, ROM and flexibility activities, strengthening and PREs, TPDN, gait and balance training as needed     Berta Minor, PTA 01/15/2023, 6:25 PM   Check all possible CPT codes: 24401 - PT Re-evaluation, 97110- Therapeutic Exercise, [phone]- Neuro Re-education, [phone] - Gait Training, [phone] - Manual Therapy, 97530 - Therapeutic Activities, 97535 - Self Care, [phone] - Iontophoresis, 97035 - Ultrasound, and [id number] - Aquatic therapy    Check all conditions that are expected to impact treatment: {Conditions expected to impact treatment:Diabetes mellitus   If treatment provided at initial evaluation, no treatment charged due to lack of authorization.

## 2023-01-15 NOTE — Therapy (Unsigned)
OUTPATIENT PHYSICAL THERAPY TREATMENT NOTE   Patient Name: Lori Shea MRN: [id number] DOB:07/14/1975, 47 y.o., female Today's Date: 01/17/2023  END OF SESSION:  PT End of Session - 01/17/23 1744     Visit Number 4    Number of Visits 12    Date for PT Re-Evaluation 02/19/23    Authorization Type [employer]    PT Start Time 1745    PT Stop Time 1825    PT Time Calculation (min) 40 min    Activity Tolerance Patient tolerated treatment well    Behavior During Therapy [facility] for tasks assessed/performed               Past Medical History:  Diagnosis Date   Allergy    Foods:  mango, honey, corn.  Allergic to "everything tested"  15 years ago in [place]   Anemia    with pregnancy   Anxiety    Arthritis    Shoulder and hips   Asthma age 25 yo   Triggers:  cold weather, exercise, allergens, anxiety, URIs   Cholelithiasis age 72 yo   Asymptomatic   Closed avulsion fracture of lateral malleolus of right fibula 12/27/2016   COVID-19    Diabetes mellitus type 2 2012   Was diagnosed 2 years earlier with PCOS   Elevated liver enzymes 09/13/2016   none since 2018   GERD (gastroesophageal reflux disease)    Heart murmur    with pregnancy 20 yrs ago per pt on 10-12-2021   History of kidney stones    Hypertension 2015   Kidney stones    family history of renal failure   Migraines 2009   Morbid obesity (HCC)    PCOS (polycystic ovarian syndrome) 2010   Pneumonia    several years ago   Wears glasses    Past Surgical History:  Procedure Laterality Date   CHOLECYSTECTOMY N/A 12/01/2019   Procedure: LAPAROSCOPIC CHOLECYSTECTOMY;  Surgeon: Darnell Level, MD;  Location: [facility];  Service: General;  Laterality: N/A;   colonscopy and endoscopy  05/25/2021   COLPOSCOPY N/A 2021   CYST EXCISION     DILITATION & CURRETTAGE/HYSTROSCOPY WITH NOVASURE ABLATION N/A 10/18/2021   Procedure: DILATATION & CURETTAGE/HYSTEROSCOPY WITH NOVASURE ABLATION;  Surgeon: Goehner Bing, MD;   Location: [facility];  Service: Gynecology;  Laterality: N/A;   LAPAROSCOPIC TUBAL LIGATION Bilateral 01/28/2019   Procedure: LAPAROSCOPIC TUBAL LIGATION AND PAP SMEAR;  Surgeon: Weaubleau Bing, MD;  Location: [facility];  Service: Gynecology;  Laterality: Bilateral;   TONSILLECTOMY AND ADENOIDECTOMY Bilateral 12/23/2018   Procedure: TONSILLECTOMY AND ADENOIDECTOMY;  Surgeon: Newman Pies, MD;  Location: [facility];  Service: ENT;  Laterality: Bilateral;   Patient Active Problem List   Diagnosis Date Noted   Left upper arm pain 12/10/2022   Soft tissue infection 12/10/2022   UTI symptoms 12/10/2022   Chronic diarrhea 01/25/2022   History of colonic polyps 01/25/2022   Gastroesophageal reflux disease 01/25/2022   Chronic nausea 01/25/2022   Gastritis without bleeding 01/25/2022   Stress incontinence of urine 01/02/2021   Skin irritation 01/02/2021   Hepatic steatosis 12/01/2019   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/2019   Hemoglobin A1C greater than 9%, indicating poor diabetic control 03/23/2019   Hyperglycemia 03/23/2019   Class 3 severe obesity due to excess calories with serious comorbidity and body mass index (BMI) of 40.0 to 44.9 in adult (HCC) 03/23/2019   Yeast infection 03/23/2019   BMI 40.0-44.9, adult (HCC) 03/09/2019  Cervical dysplasia 07/31/2017   Alopecia of scalp 05/09/2017   Pain in left toe(s) 12/27/2016   Morbid obesity (HCC) 09/13/2016   Elevated liver enzymes 09/13/2016   Allergy    Asthma    Hypertension 04/23/2013   Irregular periods 02/22/2011   Type 2 diabetes mellitus with hyperglycemia, with long-term current use of insulin (HCC) 04/23/2010   PCOS (polycystic ovarian syndrome) 04/23/2008   Migraines 04/24/2007    PCP: Ivonne Andrew, NP   REFERRING PROVIDER: Clerance Lav, DPM  REFERRING DIAG: [phone] (ICD-10-CM) - Achilles tendonitis, bilateral  THERAPY DIAG:  Achilles  tendinitis of both lower extremities  Other abnormalities of gait and mobility  Muscle weakness (generalized)  Rationale for Evaluation and Treatment: Rehabilitation  ONSET DATE: chronic  SUBJECTIVE:   SUBJECTIVE STATEMENT: Sore for 48 hours following last session but diminished after that with less baseline discomfort noted   History of L achilles tendinitis, immobilized for a brief period of time, tried to wean off but symptoms have returned.   PERTINENT HISTORY:  Foot Problem      Pt states she I just sore right now no actual pain, she is here for diabetic exam and achilles tendinitis follow up.    HPI: 47 y.o. female presenting today for follow-up of left Achilles tendinitis.  She notes that she was doing pretty well but recently had a flareup.  She wears her cam walker when it is painful and has improvement of her symptoms.   Patient is diabetic.  She denies any numbness or burning in her feet. PAIN:  Are you having pain? Yes: NPRS scale: 2/10 Pain location: L heel Pain description: ache  Aggravating factors: prolonged walking Relieving factors: rest. Ice compression   PRECAUTIONS: None  RED FLAGS: None   WEIGHT BEARING RESTRICTIONS: No  FALLS:  Has patient fallen in last 6 months? No  OCCUPATION: CNA  PLOF: Independent  PATIENT GOALS: To manage my ankle pain  NEXT MD VISIT: 6 weeks  OBJECTIVE:   DIAGNOSTIC FINDINGS: none  PATIENT SURVEYS:  FOTO 38(60 predicted)  MUSCLE LENGTH: N/A  POSTURE:  pes planus  PALPATION: TTP lateral achilles insertion into calcaneus.  LOWER EXTREMITY ROM:  A/PROM Right eval Left eval  Hip flexion    Hip extension    Hip abduction    Hip adduction    Hip internal rotation    Hip external rotation    Knee flexion    Knee extension    Ankle dorsiflexion 12/15d 10/12dP!  Ankle plantarflexion WNL WNL  Ankle inversion WNL WNL  Ankle eversion WNL WNL   (Blank rows = not tested)  LOWER EXTREMITY MMT:  MMT  Right eval Left eval  Hip flexion    Hip extension    Hip abduction    Hip adduction    Hip internal rotation    Hip external rotation    Knee flexion    Knee extension    Ankle dorsiflexion 5 4-  Ankle plantarflexion 5 4-  Ankle inversion 5 5  Ankle eversion 5 5   (Blank rows = not tested)  LOWER EXTREMITY SPECIAL TESTS:  Ankle special tests: Anterior drawer test: negative and Homan's test: negative  FUNCTIONAL TESTS:  5 times sit to stand: 10s arms crossed  GAIT: Distance walked: 74ft x2 Assistive device utilized: None Level of assistance: Complete Independence Comments: unremarkable   TODAY'S TREATMENT:    [facility] Adult PT Treatment:  DATE: 01/17/23 Therapeutic Exercise: [name] only 8 min L4 Gastroc/soleus stretch 30s x2 B ea. Runners step 4 in 10/10 Balance board A/P and M/L 60s each Manual Therapy: Skilled palpation to identify taught and irritable bands in L medial gastroc head Trigger Point Dry Needling Treatment: Pre-treatment instruction: Patient instructed on dry needling rationale, procedures, and possible side effects including pain during treatment (achy,cramping feeling), bruising, drop of blood, lightheadedness, nausea, sweating. Patient Consent Given: Yes Education handout provided: No Muscles treated: L medial gastroc head  Needle size and number: .30x68mm x 1 Electrical stimulation performed: No Parameters: N/A Treatment response/outcome: Twitch response elicited and Palpable decrease in muscle tension Post-treatment instructions: Patient instructed to expect possible mild to moderate muscle soreness later today and/or tomorrow. Patient instructed in methods to reduce muscle soreness and to continue prescribed HEP. If patient was dry needled over the lung field, patient was instructed on signs and symptoms of pneumothorax and, however unlikely, to see immediate medical attention should they occur. Patient  was also educated on signs and symptoms of infection and to seek medical attention should they occur. Patient verbalized understanding of these instructions and education.                     [facility] Adult PT Treatment:                                                DATE: 01/15/23 Therapeutic Exercise: NuStep, 5 min level 4 BAPS board - PF/DF, supination/pronation, CW, CCW x 30 sec each  Seated Heel/Toe Raises on airex, 2 x 10  Sit to stand, 2 x 10 on airex  Seated calf stretch with strap, 3 x 30 sec  Standing heel raise? Standing toe raise? Neuromuscular Re-ed: SLS, 2 x 30sec ankle isometric  Tandem stance on airex, 2 x 30 sec each                                                                                 [facility] Adult PT Treatment:                                                DATE: 01/12/2023  Therapeutic Exercise: NuStep, 5 min level 4 BAPS board - PF/DF, supination/pronation, CW, CCW x 30 sec each  Seated Heel/Toe Raises on airex, 2 x 10  Sit to stand, 2 x 10 on airex  SLS, 2 x 30sec ankle isometric  Tandem stance on airex, 2 x 30 sec each  Seated calf stretch with strap, 3 x 30 sec    PATIENT EDUCATION:  Education details: Discussed eval findings, rehab rationale and POC and patient is in agreement  Person educated: Patient Education method: Explanation Education comprehension: verbalized understanding and needs further education  HOME EXERCISE PROGRAM: Discussed SLS 30s hold B, heel raise B 1/5 s con/ecc hold  ASSESSMENT:  CLINICAL IMPRESSION: Positive response to initial TPDN  technique.  Followed procedure by aerobic work to clear metabolic byproducts and improve circulation.  Incorporated static balance challenges and heelcord stretches.  Included functional tasks   Patient reporting some relief of tightness and pain with seated calf stretch today. She had some difficulty with BAPS ankle AAROM, but had improved ROM and control with intermittent cueing. She will  benefit from ongoing progression of standing exercises at this time. Plan to reassess response to today's session and progress within POC.    OBJECTIVE IMPAIRMENTS: Abnormal gait, decreased activity tolerance, decreased balance, decreased endurance, decreased knowledge of condition, decreased mobility, difficulty walking, decreased strength, obesity, and pain.   ACTIVITY LIMITATIONS: carrying, lifting, squatting, and stairs  PERSONAL FACTORS: Age, Education, Fitness, Time since onset of injury/illness/exacerbation, and 1 comorbidity: DM  are also affecting patient's functional outcome.   REHAB POTENTIAL: Good  CLINICAL DECISION MAKING: Stable/uncomplicated  EVALUATION COMPLEXITY: Low   GOALS: Goals reviewed with patient? No  SHORT TERM GOALS: Target date: 01/10/2023   Patient to demonstrate independence in HEP  Baseline: TBD Goal status: INITIAL  2.  Decrease worst pain to 6/10 Baseline: 8/10 Goal status: INITIAL   LONG TERM GOALS: Target date: 01/31/2023   4/10 worst pain Baseline: 8/10 worst pain Goal status: INITIAL  2.  Increase FOTO Score to 60 Baseline: 38 Goal status: INITIAL  3.  Increase PROM L DF to 15d Baseline:  A/PROM Right eval Left eval  Hip flexion    Hip extension    Hip abduction    Hip adduction    Hip internal rotation    Hip external rotation    Knee flexion    Knee extension    Ankle dorsiflexion 12/15d 10/12dP!  Ankle plantarflexion WNL WNL  Ankle inversion WNL WNL  Ankle eversion WNL WNL   Goal status: INITIAL  4.  Increase L ankl PF/DF strength to 4/5 Baseline:  MMT Right eval Left eval  Hip flexion    Hip extension    Hip abduction    Hip adduction    Hip internal rotation    Hip external rotation    Knee flexion    Knee extension    Ankle dorsiflexion 5 4-  Ankle plantarflexion 5 4-  Ankle inversion 5 5  Ankle eversion 5 5   Goal status: INITIAL     PLAN:  PT FREQUENCY: 1-2x/week  PT DURATION: 6  weeks  PLANNED INTERVENTIONS: Therapeutic exercises, Therapeutic activity, Neuromuscular re-education, Balance training, Gait training, Patient/Family education, Self Care, Joint mobilization, Stair training, DME instructions, Aquatic Therapy, Dry Needling, Manual therapy, and Re-evaluation  PLAN FOR NEXT SESSION: HEP review and update, manual techniques as appropriate, aerobic tasks, ROM and flexibility activities, strengthening and PREs, TPDN, gait and balance training as needed     Hildred Laser, PT 01/17/2023, 6:27 PM   Check all possible CPT codes: 16109 - PT Re-evaluation, 97110- Therapeutic Exercise, [phone]- Neuro Re-education, [phone] - Gait Training, [phone] - Manual Therapy, 97530 - Therapeutic Activities, 97535 - Self Care, [phone] - Iontophoresis, 97035 - Ultrasound, and [id number] - Aquatic therapy    Check all conditions that are expected to impact treatment: {Conditions expected to impact treatment:Diabetes mellitus   If treatment provided at initial evaluation, no treatment charged due to lack of authorization.

## 2023-01-17 ENCOUNTER — Encounter: Payer: Self-pay | Admitting: Nurse Practitioner

## 2023-01-17 ENCOUNTER — Ambulatory Visit: Payer: 59

## 2023-01-17 DIAGNOSIS — M7662 Achilles tendinitis, left leg: Secondary | ICD-10-CM

## 2023-01-17 DIAGNOSIS — M6281 Muscle weakness (generalized): Secondary | ICD-10-CM

## 2023-01-17 DIAGNOSIS — R2689 Other abnormalities of gait and mobility: Secondary | ICD-10-CM

## 2023-01-17 DIAGNOSIS — M7661 Achilles tendinitis, right leg: Secondary | ICD-10-CM | POA: Diagnosis not present

## 2023-01-22 ENCOUNTER — Ambulatory Visit: Payer: 59

## 2023-01-22 ENCOUNTER — Other Ambulatory Visit: Payer: Self-pay | Admitting: Nurse Practitioner

## 2023-01-22 DIAGNOSIS — J4520 Mild intermittent asthma, uncomplicated: Secondary | ICD-10-CM

## 2023-01-22 NOTE — Telephone Encounter (Signed)
Please advise Kh 

## 2023-01-24 ENCOUNTER — Telehealth: Payer: Self-pay | Admitting: Pharmacy Technician

## 2023-01-24 NOTE — Telephone Encounter (Signed)
Pharmacy Patient Advocate Encounter   Received notification from CoverMyMeds that prior authorization for PANTORPAZOLE 40MG  is required/requested.   Insurance verification completed.   The patient is insured through [employer] [facility] .   Per test claim: PA required; PA submitted to [employer] [facility] via CoverMyMeds Key/confirmation #/EOC [id number] Status is pending

## 2023-01-29 ENCOUNTER — Other Ambulatory Visit: Payer: Self-pay | Admitting: Nurse Practitioner

## 2023-01-29 ENCOUNTER — Other Ambulatory Visit (HOSPITAL_COMMUNITY): Payer: Self-pay

## 2023-01-29 ENCOUNTER — Ambulatory Visit: Payer: 59 | Attending: Podiatry

## 2023-01-29 DIAGNOSIS — M7661 Achilles tendinitis, right leg: Secondary | ICD-10-CM | POA: Insufficient documentation

## 2023-01-29 DIAGNOSIS — E119 Type 2 diabetes mellitus without complications: Secondary | ICD-10-CM

## 2023-01-29 DIAGNOSIS — G4700 Insomnia, unspecified: Secondary | ICD-10-CM

## 2023-01-29 DIAGNOSIS — M6281 Muscle weakness (generalized): Secondary | ICD-10-CM | POA: Insufficient documentation

## 2023-01-29 DIAGNOSIS — J4530 Mild persistent asthma, uncomplicated: Secondary | ICD-10-CM

## 2023-01-29 DIAGNOSIS — R2689 Other abnormalities of gait and mobility: Secondary | ICD-10-CM | POA: Insufficient documentation

## 2023-01-29 DIAGNOSIS — M7662 Achilles tendinitis, left leg: Secondary | ICD-10-CM | POA: Diagnosis present

## 2023-01-29 NOTE — Therapy (Signed)
OUTPATIENT PHYSICAL THERAPY TREATMENT NOTE   Patient Name: Lori Shea MRN: [id number] DOB:02-01-1976, 47 y.o., female Today's Date: 01/29/2023  END OF SESSION:  PT End of Session - 01/29/23 1805     Visit Number 5    Number of Visits 12    Date for PT Re-Evaluation 02/19/23    Authorization Type [employer]    PT Start Time 1800    PT Stop Time 1840    PT Time Calculation (min) 40 min    Activity Tolerance Patient tolerated treatment well    Behavior During Therapy [facility] for tasks assessed/performed                Past Medical History:  Diagnosis Date   Allergy    Foods:  mango, honey, corn.  Allergic to "everything tested"  15 years ago in [place]   Anemia    with pregnancy   Anxiety    Arthritis    Shoulder and hips   Asthma age 47 yo   Triggers:  cold weather, exercise, allergens, anxiety, URIs   Cholelithiasis age 9 yo   Asymptomatic   Closed avulsion fracture of lateral malleolus of right fibula 12/27/2016   COVID-19    Diabetes mellitus type 2 2012   Was diagnosed 2 years earlier with PCOS   Elevated liver enzymes 09/13/2016   none since 2018   GERD (gastroesophageal reflux disease)    Heart murmur    with pregnancy 20 yrs ago per pt on 10-12-2021   History of kidney stones    Hypertension 2015   Kidney stones    family history of renal failure   Migraines 2009   Morbid obesity (HCC)    PCOS (polycystic ovarian syndrome) 2010   Pneumonia    several years ago   Wears glasses    Past Surgical History:  Procedure Laterality Date   CHOLECYSTECTOMY N/A 12/01/2019   Procedure: LAPAROSCOPIC CHOLECYSTECTOMY;  Surgeon: Darnell Level, MD;  Location: [facility];  Service: General;  Laterality: N/A;   colonscopy and endoscopy  05/25/2021   COLPOSCOPY N/A 2021   CYST EXCISION     DILITATION & CURRETTAGE/HYSTROSCOPY WITH NOVASURE ABLATION N/A 10/18/2021   Procedure: DILATATION & CURETTAGE/HYSTEROSCOPY WITH NOVASURE ABLATION;  Surgeon: Stanwood Bing, MD;   Location: [facility];  Service: Gynecology;  Laterality: N/A;   LAPAROSCOPIC TUBAL LIGATION Bilateral 01/28/2019   Procedure: LAPAROSCOPIC TUBAL LIGATION AND PAP SMEAR;  Surgeon: Lebanon Bing, MD;  Location: [facility];  Service: Gynecology;  Laterality: Bilateral;   TONSILLECTOMY AND ADENOIDECTOMY Bilateral 12/23/2018   Procedure: TONSILLECTOMY AND ADENOIDECTOMY;  Surgeon: Newman Pies, MD;  Location: [facility];  Service: ENT;  Laterality: Bilateral;   Patient Active Problem List   Diagnosis Date Noted   Left upper arm pain 12/10/2022   Soft tissue infection 12/10/2022   UTI symptoms 12/10/2022   Chronic diarrhea 01/25/2022   History of colonic polyps 01/25/2022   Gastroesophageal reflux disease 01/25/2022   Chronic nausea 01/25/2022   Gastritis without bleeding 01/25/2022   Stress incontinence of urine 01/02/2021   Skin irritation 01/02/2021   Hepatic steatosis 12/01/2019   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/2019   Hemoglobin A1C greater than 9%, indicating poor diabetic control 03/23/2019   Hyperglycemia 03/23/2019   Class 3 severe obesity due to excess calories with serious comorbidity and body mass index (BMI) of 40.0 to 44.9 in adult (HCC) 03/23/2019   Yeast infection 03/23/2019   BMI 40.0-44.9, adult (HCC)  03/09/2019   Cervical dysplasia 07/31/2017   Alopecia of scalp 05/09/2017   Pain in left toe(s) 12/27/2016   Morbid obesity (HCC) 09/13/2016   Elevated liver enzymes 09/13/2016   Allergy    Asthma    Hypertension 04/23/2013   Irregular periods 02/22/2011   Type 2 diabetes mellitus with hyperglycemia, with long-term current use of insulin (HCC) 04/23/2010   PCOS (polycystic ovarian syndrome) 04/23/2008   Migraines 04/24/2007    PCP: Ivonne Andrew, NP   REFERRING PROVIDER: Clerance Lav, DPM  REFERRING DIAG: [phone] (ICD-10-CM) - Achilles tendonitis, bilateral  THERAPY DIAG:  Achilles  tendinitis of both lower extremities  Other abnormalities of gait and mobility  Muscle weakness (generalized)  Rationale for Evaluation and Treatment: Rehabilitation  ONSET DATE: chronic  SUBJECTIVE:   SUBJECTIVE STATEMENT:   Patient reporting 7-8/10 pain today at end of workday. She also states that she was walking a lot and going up and down stairs over the weekend.    History of L achilles tendinitis, immobilized for a brief period of time, tried to wean off but symptoms have returned.   PERTINENT HISTORY:  Foot Problem      Pt states she I just sore right now no actual pain, she is here for diabetic exam and achilles tendinitis follow up.    HPI: 47 y.o. female presenting today for follow-up of left Achilles tendinitis.  She notes that she was doing pretty well but recently had a flareup.  She wears her cam walker when it is painful and has improvement of her symptoms.   Patient is diabetic.  She denies any numbness or burning in her feet. PAIN:  Are you having pain? Yes: NPRS scale: 2/10 Pain location: L heel Pain description: ache  Aggravating factors: prolonged walking Relieving factors: rest. Ice compression   PRECAUTIONS: None  RED FLAGS: None   WEIGHT BEARING RESTRICTIONS: No  FALLS:  Has patient fallen in last 6 months? No  OCCUPATION: CNA  PLOF: Independent  PATIENT GOALS: To manage my ankle pain  NEXT MD VISIT: 6 weeks  OBJECTIVE:   DIAGNOSTIC FINDINGS: none  PATIENT SURVEYS:  FOTO 38(60 predicted)  MUSCLE LENGTH: N/A  POSTURE:  pes planus  PALPATION: TTP lateral achilles insertion into calcaneus.  LOWER EXTREMITY ROM:  A/PROM Right eval Left eval  Hip flexion    Hip extension    Hip abduction    Hip adduction    Hip internal rotation    Hip external rotation    Knee flexion    Knee extension    Ankle dorsiflexion 12/15d 10/12dP!  Ankle plantarflexion WNL WNL  Ankle inversion WNL WNL  Ankle eversion WNL WNL   (Blank rows  = not tested)  LOWER EXTREMITY MMT:  MMT Right eval Left eval  Hip flexion    Hip extension    Hip abduction    Hip adduction    Hip internal rotation    Hip external rotation    Knee flexion    Knee extension    Ankle dorsiflexion 5 4-  Ankle plantarflexion 5 4-  Ankle inversion 5 5  Ankle eversion 5 5   (Blank rows = not tested)  LOWER EXTREMITY SPECIAL TESTS:  Ankle special tests: Anterior drawer test: negative and Homan's test: negative  FUNCTIONAL TESTS:  5 times sit to stand: 10s arms crossed  GAIT: Distance walked: 32ft x2 Assistive device utilized: None Level of assistance: Complete Independence Comments: unremarkable   TODAY'S TREATMENT:  [facility] Adult PT Treatment:                                                DATE: 01/29/2023  Therapeutic Exercise: NuStep, 5 min level 4 Prostretch x ankle PF/DF, 2 x 1 min  Seated Heel/Toe Raises on airex, 2 x 10  Sit to stand, 2 x 10 on airex  Seated calf stretch with strap, 3 x 30 sec    [facility] Adult PT Treatment:                                                DATE: 01/17/23 Therapeutic Exercise: [name] only 8 min L4 Gastroc/soleus stretch 30s x2 B ea. Runners step 4 in 10/10 Balance board A/P and M/L 60s each Manual Therapy: Skilled palpation to identify taught and irritable bands in L medial gastroc head Trigger Point Dry Needling Treatment: Pre-treatment instruction: Patient instructed on dry needling rationale, procedures, and possible side effects including pain during treatment (achy,cramping feeling), bruising, drop of blood, lightheadedness, nausea, sweating. Patient Consent Given: Yes Education handout provided: No Muscles treated: L medial gastroc head  Needle size and number: .30x68mm x 1 Electrical stimulation performed: No Parameters: N/A Treatment response/outcome: Twitch response elicited and Palpable decrease in muscle tension Post-treatment instructions: Patient instructed to expect possible  mild to moderate muscle soreness later today and/or tomorrow. Patient instructed in methods to reduce muscle soreness and to continue prescribed HEP. If patient was dry needled over the lung field, patient was instructed on signs and symptoms of pneumothorax and, however unlikely, to see immediate medical attention should they occur. Patient was also educated on signs and symptoms of infection and to seek medical attention should they occur. Patient verbalized understanding of these instructions and education.                     [facility] Adult PT Treatment:                                                DATE: 01/15/23 Therapeutic Exercise: NuStep, 5 min level 4 BAPS board - PF/DF, supination/pronation, CW, CCW x 30 sec each  Seated Heel/Toe Raises on airex, 2 x 10  Sit to stand, 2 x 10 on airex  Seated calf stretch with strap, 3 x 30 sec   Neuromuscular Re-ed: SLS, 2 x 30sec ankle isometric  Tandem stance on airex, 2 x 30 sec each                                                                                 [facility] Adult PT Treatment:  DATE: 01/12/2023  Therapeutic Exercise: NuStep, 5 min level 4 BAPS board - PF/DF, supination/pronation, CW, CCW x 30 sec each  Seated Heel/Toe Raises on airex, 2 x 10  Sit to stand, 2 x 10 on airex  SLS, 2 x 30sec ankle isometric  Tandem stance on airex, 2 x 30 sec each  Seated calf stretch with strap, 3 x 30 sec    PATIENT EDUCATION:  Education details: Discussed eval findings, rehab rationale and POC and patient is in agreement  Person educated: Patient Education method: Explanation Education comprehension: verbalized understanding and needs further education  HOME EXERCISE PROGRAM: Discussed SLS 30s hold B, heel raise B 1/5 s con/ecc hold  ASSESSMENT:  CLINICAL IMPRESSION: Patient was somewhat limited with exercises today d/t reported pain severity and overall LE fatigue. She continues to respond well  to current POC, including ankle AAROM, and navigation of compliant surfaces. She requires progression of weight bearing exercises at this time to maximize tolerance of functional activities as limited by achilles related symptoms.     OBJECTIVE IMPAIRMENTS: Abnormal gait, decreased activity tolerance, decreased balance, decreased endurance, decreased knowledge of condition, decreased mobility, difficulty walking, decreased strength, obesity, and pain.   ACTIVITY LIMITATIONS: carrying, lifting, squatting, and stairs  PERSONAL FACTORS: Age, Education, Fitness, Time since onset of injury/illness/exacerbation, and 1 comorbidity: DM  are also affecting patient's functional outcome.   REHAB POTENTIAL: Good  CLINICAL DECISION MAKING: Stable/uncomplicated  EVALUATION COMPLEXITY: Low   GOALS: Goals reviewed with patient? No  SHORT TERM GOALS: Target date: 01/10/2023   Patient to demonstrate independence in HEP  Baseline: TBD Goal status: INITIAL  2.  Decrease worst pain to 6/10 Baseline: 8/10 Goal status: INITIAL   LONG TERM GOALS: Target date: 01/31/2023   4/10 worst pain Baseline: 8/10 worst pain Goal status: INITIAL  2.  Increase FOTO Score to 60 Baseline: 38 Goal status: INITIAL  3.  Increase PROM L DF to 15d Baseline:  A/PROM Right eval Left eval  Hip flexion    Hip extension    Hip abduction    Hip adduction    Hip internal rotation    Hip external rotation    Knee flexion    Knee extension    Ankle dorsiflexion 12/15d 10/12dP!  Ankle plantarflexion WNL WNL  Ankle inversion WNL WNL  Ankle eversion WNL WNL   Goal status: INITIAL  4.  Increase L ankl PF/DF strength to 4/5 Baseline:  MMT Right eval Left eval  Hip flexion    Hip extension    Hip abduction    Hip adduction    Hip internal rotation    Hip external rotation    Knee flexion    Knee extension    Ankle dorsiflexion 5 4-  Ankle plantarflexion 5 4-  Ankle inversion 5 5  Ankle eversion 5 5    Goal status: INITIAL     PLAN:  PT FREQUENCY: 1-2x/week  PT DURATION: 6 weeks  PLANNED INTERVENTIONS: Therapeutic exercises, Therapeutic activity, Neuromuscular re-education, Balance training, Gait training, Patient/Family education, Self Care, Joint mobilization, Stair training, DME instructions, Aquatic Therapy, Dry Needling, Manual therapy, and Re-evaluation  PLAN FOR NEXT SESSION: HEP review and update, manual techniques as appropriate, aerobic tasks, ROM and flexibility activities, strengthening and PREs, TPDN, gait and balance training as needed     Mauri Reading, PT, DPT  01/29/2023, 7:07 PM   Check all possible CPT codes: 09604 - PT Re-evaluation, 97110- Therapeutic Exercise, [phone]- Neuro Re-education, [phone] - Gait Training, [phone] -  Manual Therapy, 97530 - Therapeutic Activities, [phone] - Self Care, 60454 - Iontophoresis, [id number] - Ultrasound, and [id number] - Aquatic therapy    Check all conditions that are expected to impact treatment: {Conditions expected to impact treatment:Diabetes mellitus   If treatment provided at initial evaluation, no treatment charged due to lack of authorization.

## 2023-01-29 NOTE — Therapy (Unsigned)
OUTPATIENT PHYSICAL THERAPY TREATMENT NOTE   Patient Name: Lori Shea MRN: [id number] DOB:March 27, 1976, 47 y.o., female Today's Date: 01/29/2023  END OF SESSION:      Past Medical History:  Diagnosis Date   Allergy    Foods:  mango, honey, corn.  Allergic to "everything tested"  15 years ago in [place]   Anemia    with pregnancy   Anxiety    Arthritis    Shoulder and hips   Asthma age 61 yo   Triggers:  cold weather, exercise, allergens, anxiety, URIs   Cholelithiasis age 19 yo   Asymptomatic   Closed avulsion fracture of lateral malleolus of right fibula 12/27/2016   COVID-19    Diabetes mellitus type 2 2012   Was diagnosed 2 years earlier with PCOS   Elevated liver enzymes 09/13/2016   none since 2018   GERD (gastroesophageal reflux disease)    Heart murmur    with pregnancy 20 yrs ago per pt on 10-12-2021   History of kidney stones    Hypertension 2015   Kidney stones    family history of renal failure   Migraines 2009   Morbid obesity (HCC)    PCOS (polycystic ovarian syndrome) 2010   Pneumonia    several years ago   Wears glasses    Past Surgical History:  Procedure Laterality Date   CHOLECYSTECTOMY N/A 12/01/2019   Procedure: LAPAROSCOPIC CHOLECYSTECTOMY;  Surgeon: Darnell Level, MD;  Location: [facility];  Service: General;  Laterality: N/A;   colonscopy and endoscopy  05/25/2021   COLPOSCOPY N/A 2021   CYST EXCISION     DILITATION & CURRETTAGE/HYSTROSCOPY WITH NOVASURE ABLATION N/A 10/18/2021   Procedure: DILATATION & CURETTAGE/HYSTEROSCOPY WITH NOVASURE ABLATION;  Surgeon: Kinder Bing, MD;  Location: [facility];  Service: Gynecology;  Laterality: N/A;   LAPAROSCOPIC TUBAL LIGATION Bilateral 01/28/2019   Procedure: LAPAROSCOPIC TUBAL LIGATION AND PAP SMEAR;  Surgeon: Merna Bing, MD;  Location: [facility];  Service: Gynecology;  Laterality: Bilateral;   TONSILLECTOMY AND ADENOIDECTOMY Bilateral 12/23/2018    Procedure: TONSILLECTOMY AND ADENOIDECTOMY;  Surgeon: Newman Pies, MD;  Location: [facility];  Service: ENT;  Laterality: Bilateral;   Patient Active Problem List   Diagnosis Date Noted   Left upper arm pain 12/10/2022   Soft tissue infection 12/10/2022   UTI symptoms 12/10/2022   Chronic diarrhea 01/25/2022   History of colonic polyps 01/25/2022   Gastroesophageal reflux disease 01/25/2022   Chronic nausea 01/25/2022   Gastritis without bleeding 01/25/2022   Stress incontinence of urine 01/02/2021   Skin irritation 01/02/2021   Hepatic steatosis 12/01/2019   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/2019   Hemoglobin A1C greater than 9%, indicating poor diabetic control 03/23/2019   Hyperglycemia 03/23/2019   Class 3 severe obesity due to excess calories with serious comorbidity and body mass index (BMI) of 40.0 to 44.9 in adult (HCC) 03/23/2019   Yeast infection 03/23/2019   BMI 40.0-44.9, adult (HCC) 03/09/2019   Cervical dysplasia 07/31/2017   Alopecia of scalp 05/09/2017   Pain in left toe(s) 12/27/2016   Morbid obesity (HCC) 09/13/2016   Elevated liver enzymes 09/13/2016   Allergy    Asthma    Hypertension 04/23/2013   Irregular periods 02/22/2011   Type 2 diabetes mellitus with hyperglycemia, with long-term current use of insulin (HCC) 04/23/2010   PCOS (polycystic ovarian syndrome) 04/23/2008   Migraines 04/24/2007    PCP: Ivonne Andrew, NP   REFERRING PROVIDER: Lucia Estelle  D, DPM  REFERRING DIAG: M76.61,M76.62 (ICD-10-CM) - Achilles tendonitis, bilateral  THERAPY DIAG:  No diagnosis found.  Rationale for Evaluation and Treatment: Rehabilitation  ONSET DATE: chronic  SUBJECTIVE:   SUBJECTIVE STATEMENT:   ***  Sore for 48 hours following last session but diminished after that with less baseline discomfort noted   History of L achilles tendinitis, immobilized for a brief period of time, tried to wean off but symptoms have returned.    PERTINENT HISTORY:  Foot Problem      Pt states she I just sore right now no actual pain, she is here for diabetic exam and achilles tendinitis follow up.    HPI: 47 y.o. female presenting today for follow-up of left Achilles tendinitis.  She notes that she was doing pretty well but recently had a flareup.  She wears her cam walker when it is painful and has improvement of her symptoms.   Patient is diabetic.  She denies any numbness or burning in her feet. PAIN:  Are you having pain? Yes: NPRS scale: 2/10 Pain location: L heel Pain description: ache  Aggravating factors: prolonged walking Relieving factors: rest. Ice compression   PRECAUTIONS: None  RED FLAGS: None   WEIGHT BEARING RESTRICTIONS: No  FALLS:  Has patient fallen in last 6 months? No  OCCUPATION: CNA  PLOF: Independent  PATIENT GOALS: To manage my ankle pain  NEXT MD VISIT: 6 weeks  OBJECTIVE:   DIAGNOSTIC FINDINGS: none  PATIENT SURVEYS:  FOTO 38(60 predicted)  MUSCLE LENGTH: N/A  POSTURE:  pes planus  PALPATION: TTP lateral achilles insertion into calcaneus.  LOWER EXTREMITY ROM:  A/PROM Right eval Left eval  Hip flexion    Hip extension    Hip abduction    Hip adduction    Hip internal rotation    Hip external rotation    Knee flexion    Knee extension    Ankle dorsiflexion 12/15d 10/12dP!  Ankle plantarflexion WNL WNL  Ankle inversion WNL WNL  Ankle eversion WNL WNL   (Blank rows = not tested)  LOWER EXTREMITY MMT:  MMT Right eval Left eval  Hip flexion    Hip extension    Hip abduction    Hip adduction    Hip internal rotation    Hip external rotation    Knee flexion    Knee extension    Ankle dorsiflexion 5 4-  Ankle plantarflexion 5 4-  Ankle inversion 5 5  Ankle eversion 5 5   (Blank rows = not tested)  LOWER EXTREMITY SPECIAL TESTS:  Ankle special tests: Anterior drawer test: negative and Homan's test: negative  FUNCTIONAL TESTS:  5 times sit to stand:  10s arms crossed  GAIT: Distance walked: 48ft x2 Assistive device utilized: None Level of assistance: Complete Independence Comments: unremarkable   TODAY'S TREATMENT:     [facility] Adult PT Treatment:                                                DATE: 01/29/2023 ***  Therapeutic Exercise: NuStep, 5 min level 4 BAPS board - PF/DF, supination/pronation, CW, CCW x 30 sec each  Seated Heel/Toe Raises on airex, 2 x 10  Sit to stand, 2 x 10 on airex  Seated calf stretch with strap, 3 x 30 sec   Neuromuscular Re-ed: SLS, 2 x 30sec ankle isometric  Tandem stance on airex, 2 x 30 sec each   [facility] Adult PT Treatment:                                                DATE: 01/17/23 Therapeutic Exercise: [name] only 8 min L4 Gastroc/soleus stretch 30s x2 B ea. Runners step 4 in 10/10 Balance board A/P and M/L 60s each Manual Therapy: Skilled palpation to identify taught and irritable bands in L medial gastroc head Trigger Point Dry Needling Treatment: Pre-treatment instruction: Patient instructed on dry needling rationale, procedures, and possible side effects including pain during treatment (achy,cramping feeling), bruising, drop of blood, lightheadedness, nausea, sweating. Patient Consent Given: Yes Education handout provided: No Muscles treated: L medial gastroc head  Needle size and number: .30x59mm x 1 Electrical stimulation performed: No Parameters: N/A Treatment response/outcome: Twitch response elicited and Palpable decrease in muscle tension Post-treatment instructions: Patient instructed to expect possible mild to moderate muscle soreness later today and/or tomorrow. Patient instructed in methods to reduce muscle soreness and to continue prescribed HEP. If patient was dry needled over the lung field, patient was instructed on signs and symptoms of pneumothorax and, however unlikely, to see immediate medical attention should they occur. Patient was also educated on signs and  symptoms of infection and to seek medical attention should they occur. Patient verbalized understanding of these instructions and education.                     [facility] Adult PT Treatment:                                                DATE: 01/15/23 Therapeutic Exercise: NuStep, 5 min level 4 BAPS board - PF/DF, supination/pronation, CW, CCW x 30 sec each  Seated Heel/Toe Raises on airex, 2 x 10  Sit to stand, 2 x 10 on airex  Seated calf stretch with strap, 3 x 30 sec   Neuromuscular Re-ed: SLS, 2 x 30sec ankle isometric  Tandem stance on airex, 2 x 30 sec each                                                                                 [facility] Adult PT Treatment:                                                DATE: 01/12/2023  Therapeutic Exercise: NuStep, 5 min level 4 BAPS board - PF/DF, supination/pronation, CW, CCW x 30 sec each  Seated Heel/Toe Raises on airex, 2 x 10  Sit to stand, 2 x 10 on airex  SLS, 2 x 30sec ankle isometric  Tandem stance on airex, 2 x 30 sec each  Seated calf stretch with strap, 3 x 30 sec    PATIENT  EDUCATION:  Education details: Discussed eval findings, rehab rationale and POC and patient is in agreement  Person educated: Patient Education method: Explanation Education comprehension: verbalized understanding and needs further education  HOME EXERCISE PROGRAM: Discussed SLS 30s hold B, heel raise B 1/5 s con/ecc hold  ASSESSMENT:  CLINICAL IMPRESSION: ***  Positive response to initial TPDN technique.  Followed procedure by aerobic work to clear metabolic byproducts and improve circulation.  Incorporated static balance challenges and heelcord stretches.  Included functional tasks   Patient reporting some relief of tightness and pain with seated calf stretch today. She had some difficulty with BAPS ankle AAROM, but had improved ROM and control with intermittent cueing. She will benefit from ongoing progression of standing exercises at this time.  Plan to reassess response to today's session and progress within POC.    OBJECTIVE IMPAIRMENTS: Abnormal gait, decreased activity tolerance, decreased balance, decreased endurance, decreased knowledge of condition, decreased mobility, difficulty walking, decreased strength, obesity, and pain.   ACTIVITY LIMITATIONS: carrying, lifting, squatting, and stairs  PERSONAL FACTORS: Age, Education, Fitness, Time since onset of injury/illness/exacerbation, and 1 comorbidity: DM  are also affecting patient's functional outcome.   REHAB POTENTIAL: Good  CLINICAL DECISION MAKING: Stable/uncomplicated  EVALUATION COMPLEXITY: Low   GOALS: Goals reviewed with patient? No  SHORT TERM GOALS: Target date: 01/10/2023   Patient to demonstrate independence in HEP  Baseline: TBD Goal status: INITIAL  2.  Decrease worst pain to 6/10 Baseline: 8/10 Goal status: INITIAL   LONG TERM GOALS: Target date: 01/31/2023   4/10 worst pain Baseline: 8/10 worst pain Goal status: INITIAL  2.  Increase FOTO Score to 60 Baseline: 38 Goal status: INITIAL  3.  Increase PROM L DF to 15d Baseline:  A/PROM Right eval Left eval  Hip flexion    Hip extension    Hip abduction    Hip adduction    Hip internal rotation    Hip external rotation    Knee flexion    Knee extension    Ankle dorsiflexion 12/15d 10/12dP!  Ankle plantarflexion WNL WNL  Ankle inversion WNL WNL  Ankle eversion WNL WNL   Goal status: INITIAL  4.  Increase L ankl PF/DF strength to 4/5 Baseline:  MMT Right eval Left eval  Hip flexion    Hip extension    Hip abduction    Hip adduction    Hip internal rotation    Hip external rotation    Knee flexion    Knee extension    Ankle dorsiflexion 5 4-  Ankle plantarflexion 5 4-  Ankle inversion 5 5  Ankle eversion 5 5   Goal status: INITIAL     PLAN:  PT FREQUENCY: 1-2x/week  PT DURATION: 6 weeks  PLANNED INTERVENTIONS: Therapeutic exercises, Therapeutic activity,  Neuromuscular re-education, Balance training, Gait training, Patient/Family education, Self Care, Joint mobilization, Stair training, DME instructions, Aquatic Therapy, Dry Needling, Manual therapy, and Re-evaluation  PLAN FOR NEXT SESSION: HEP review and update, manual techniques as appropriate, aerobic tasks, ROM and flexibility activities, strengthening and PREs, TPDN, gait and balance training as needed     Mauri Reading, PT, DPT  01/29/2023, 12:42 PM   Check all possible CPT codes: 28413 - PT Re-evaluation, 97110- Therapeutic Exercise, [phone]- Neuro Re-education, [phone] - Gait Training, [phone] - Manual Therapy, 97530 - Therapeutic Activities, 97535 - Self Care, [phone] - Iontophoresis, [id number] - Ultrasound, and [id number] - Aquatic therapy    Check all conditions that are expected to impact treatment: {Conditions expected  to impact treatment:Diabetes mellitus   If treatment provided at initial evaluation, no treatment charged due to lack of authorization.

## 2023-01-29 NOTE — Telephone Encounter (Signed)
Pharmacy Patient Advocate Encounter  Received notification from [facility] that Prior Authorization for PANTOPRAZOLE 40MG  has been APPROVED from 10.5.24 to 10.5.25. Ran test claim, Copay is $0. This test claim was processed through [facility]- copay amounts may vary at other pharmacies due to pharmacy/plan contracts, or as the patient moves through the different stages of their insurance plan.   PA #/Case ID/Reference #: [id number]

## 2023-01-29 NOTE — Telephone Encounter (Signed)
Please advise Kh 

## 2023-01-31 ENCOUNTER — Ambulatory Visit: Payer: 59

## 2023-01-31 DIAGNOSIS — R2689 Other abnormalities of gait and mobility: Secondary | ICD-10-CM

## 2023-01-31 DIAGNOSIS — M7661 Achilles tendinitis, right leg: Secondary | ICD-10-CM | POA: Diagnosis not present

## 2023-01-31 DIAGNOSIS — M6281 Muscle weakness (generalized): Secondary | ICD-10-CM

## 2023-02-01 NOTE — Addendum Note (Signed)
Addended by: Hildred Laser on: 02/01/2023 07:59 AM   Modules accepted: Orders

## 2023-02-06 ENCOUNTER — Encounter: Payer: Self-pay | Admitting: *Deleted

## 2023-02-06 ENCOUNTER — Encounter: Payer: Self-pay | Admitting: Nurse Practitioner

## 2023-02-06 ENCOUNTER — Ambulatory Visit: Payer: 59 | Admitting: Nurse Practitioner

## 2023-02-06 ENCOUNTER — Ambulatory Visit: Payer: 59

## 2023-02-06 VITALS — BP 118/78 | HR 82

## 2023-02-06 DIAGNOSIS — Z7984 Long term (current) use of oral hypoglycemic drugs: Secondary | ICD-10-CM | POA: Diagnosis not present

## 2023-02-06 DIAGNOSIS — M6281 Muscle weakness (generalized): Secondary | ICD-10-CM

## 2023-02-06 DIAGNOSIS — M7661 Achilles tendinitis, right leg: Secondary | ICD-10-CM | POA: Diagnosis not present

## 2023-02-06 DIAGNOSIS — E119 Type 2 diabetes mellitus without complications: Secondary | ICD-10-CM

## 2023-02-06 DIAGNOSIS — R2689 Other abnormalities of gait and mobility: Secondary | ICD-10-CM

## 2023-02-06 DIAGNOSIS — Z794 Long term (current) use of insulin: Secondary | ICD-10-CM | POA: Diagnosis not present

## 2023-02-06 DIAGNOSIS — I10 Essential (primary) hypertension: Secondary | ICD-10-CM | POA: Diagnosis not present

## 2023-02-06 DIAGNOSIS — E1165 Type 2 diabetes mellitus with hyperglycemia: Secondary | ICD-10-CM | POA: Diagnosis not present

## 2023-02-06 DIAGNOSIS — E785 Hyperlipidemia, unspecified: Secondary | ICD-10-CM

## 2023-02-06 LAB — POCT GLYCOSYLATED HEMOGLOBIN (HGB A1C): Hemoglobin A1C: 9.3 % — AB (ref 4.0–5.6)

## 2023-02-06 MED ORDER — METFORMIN HCL 500 MG PO TABS: 500.0000 mg | ORAL_TABLET | Freq: Two times a day (BID) | ORAL | 3 refills | Status: DC

## 2023-02-06 MED ORDER — SITAGLIPTIN PHOSPHATE 100 MG PO TABS: 100.0000 mg | ORAL_TABLET | Freq: Every day | ORAL | 1 refills | Status: DC

## 2023-02-06 MED ORDER — TRESIBA FLEXTOUCH 100 UNIT/ML ~~LOC~~ SOPN: 80.0000 [IU] | PEN_INJECTOR | Freq: Every day | SUBCUTANEOUS | 3 refills | Status: DC

## 2023-02-06 MED ORDER — PEN NEEDLES 31G X 8 MM MISC: 3 refills | Status: DC

## 2023-02-06 MED ORDER — NOVOLOG FLEXPEN 100 UNIT/ML ~~LOC~~ SOPN: 16.0000 [IU] | PEN_INJECTOR | Freq: Three times a day (TID) | SUBCUTANEOUS | 3 refills | Status: DC

## 2023-02-06 NOTE — Therapy (Addendum)
OUTPATIENT PHYSICAL THERAPY TREATMENT NOTE/DISCHARGE SUMMARY   Patient Name: Lori Shea MRN: [id number] DOB:16-Oct-1975, 47 y.o., female Today's Date: 02/18/2023 PHYSICAL THERAPY DISCHARGE SUMMARY  Visits from Start of Care: 7  Current functional level related to goals / functional outcomes: Goals met   Remaining deficits: None reported   Education / Equipment: HEP   Patient agrees to discharge. Patient goals were met. Patient is being discharged due to being pleased with the current functional level.  END OF SESSION:        Past Medical History:  Diagnosis Date   Allergy    Foods:  mango, honey, corn.  Allergic to "everything tested"  15 years ago in [place]   Anemia    with pregnancy   Anxiety    Arthritis    Shoulder and hips   Asthma age 5 yo   Triggers:  cold weather, exercise, allergens, anxiety, URIs   Cholelithiasis age 46 yo   Asymptomatic   Closed avulsion fracture of lateral malleolus of right fibula 12/27/2016   COVID-19    Diabetes mellitus type 2 2012   Was diagnosed 2 years earlier with PCOS   Elevated liver enzymes 09/13/2016   none since 2018   GERD (gastroesophageal reflux disease)    Heart murmur    with pregnancy 20 yrs ago per pt on 10-12-2021   History of kidney stones    Hypertension 2015   Kidney stones    family history of renal failure   Migraines 2009   Morbid obesity (HCC)    PCOS (polycystic ovarian syndrome) 2010   Pneumonia    several years ago   Wears glasses    Past Surgical History:  Procedure Laterality Date   CHOLECYSTECTOMY N/A 12/01/2019   Procedure: LAPAROSCOPIC CHOLECYSTECTOMY;  Surgeon: Darnell Level, MD;  Location: [facility];  Service: General;  Laterality: N/A;   colonscopy and endoscopy  05/25/2021   COLPOSCOPY N/A 2021   CYST EXCISION     DILITATION & CURRETTAGE/HYSTROSCOPY WITH NOVASURE ABLATION N/A 10/18/2021   Procedure: DILATATION & CURETTAGE/HYSTEROSCOPY WITH NOVASURE ABLATION;  Surgeon: [place] Bing, MD;  Location: [facility];  Service: Gynecology;  Laterality: N/A;   LAPAROSCOPIC TUBAL LIGATION Bilateral 01/28/2019   Procedure: LAPAROSCOPIC TUBAL LIGATION AND PAP SMEAR;  Surgeon: [place] Bing, MD;  Location: [facility];  Service: Gynecology;  Laterality: Bilateral;   TONSILLECTOMY AND ADENOIDECTOMY Bilateral 12/23/2018   Procedure: TONSILLECTOMY AND ADENOIDECTOMY;  Surgeon: Newman Pies, MD;  Location: [facility];  Service: ENT;  Laterality: Bilateral;   Patient Active Problem List   Diagnosis Date Noted   Left upper arm pain 12/10/2022   Soft tissue infection 12/10/2022   UTI symptoms 12/10/2022   Chronic diarrhea 01/25/2022   History of colonic polyps 01/25/2022   Gastroesophageal reflux disease 01/25/2022   Chronic nausea 01/25/2022   Gastritis without bleeding 01/25/2022   Stress incontinence of urine 01/02/2021   Skin irritation 01/02/2021   Hepatic steatosis 12/01/2019   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/2019   Hemoglobin A1C greater than 9%, indicating poor diabetic control 03/23/2019   Hyperglycemia 03/23/2019   Class 3 severe obesity due to excess calories with serious comorbidity and body mass index (BMI) of 40.0 to 44.9 in adult (HCC) 03/23/2019   Yeast infection 03/23/2019   BMI 40.0-44.9, adult (HCC) 03/09/2019   Cervical dysplasia 07/31/2017   Alopecia of scalp 05/09/2017   Pain in left toe(s) 12/27/2016   Morbid obesity (HCC) 09/13/2016   Elevated  liver enzymes 09/13/2016   Allergy    Asthma    Hypertension 04/23/2013   Irregular periods 02/22/2011   Type 2 diabetes mellitus with hyperglycemia, with long-term current use of insulin (HCC) 04/23/2010   PCOS (polycystic ovarian syndrome) 04/23/2008   Migraines 04/24/2007    PCP: Ivonne Andrew, NP   REFERRING PROVIDER: Clerance Lav, DPM  REFERRING DIAG: [phone] (ICD-10-CM) - Achilles tendonitis, bilateral  THERAPY DIAG:   Achilles tendinitis of both lower extremities  Other abnormalities of gait and mobility  Muscle weakness (generalized)  Rationale for Evaluation and Treatment: Rehabilitation  ONSET DATE: chronic  SUBJECTIVE:   SUBJECTIVE STATEMENT:   Patient continues to respond well to physical therapy, feels that current plan of care is effective including dry needling.  PERTINENT HISTORY:  Foot Problem      Pt states she I just sore right now no actual pain, she is here for diabetic exam and achilles tendinitis follow up.    HPI: 47 y.o. female presenting today for follow-up of left Achilles tendinitis.  She notes that she was doing pretty well but recently had a flareup.  She wears her cam walker when it is painful and has improvement of her symptoms.   Patient is diabetic.  She denies any numbness or burning in her feet. PAIN:  Are you having pain? Yes: NPRS scale: 2/10 Pain location: L heel Pain description: ache  Aggravating factors: prolonged walking Relieving factors: rest. Ice compression   PRECAUTIONS: None  RED FLAGS: None   WEIGHT BEARING RESTRICTIONS: No  FALLS:  Has patient fallen in last 6 months? No  OCCUPATION: CNA  PLOF: Independent  PATIENT GOALS: To manage my ankle pain  NEXT MD VISIT: 6 weeks  OBJECTIVE:   DIAGNOSTIC FINDINGS: none  PATIENT SURVEYS:  FOTO 38(60 predicted) 01/31/23 99%  MUSCLE LENGTH: N/A  POSTURE:  pes planus  PALPATION: TTP lateral achilles insertion into calcaneus.  LOWER EXTREMITY ROM:  A/PROM Right eval Left eval L  01/31/23  Hip flexion     Hip extension     Hip abduction     Hip adduction     Hip internal rotation     Hip external rotation     Knee flexion     Knee extension     Ankle dorsiflexion 12/15d 10/12dP! 12/15d  Ankle plantarflexion WNL WNL   Ankle inversion WNL WNL   Ankle eversion WNL WNL    (Blank rows = not tested)  LOWER EXTREMITY MMT:  MMT Right eval Left eval L 01/31/23  Hip flexion      Hip extension     Hip abduction     Hip adduction     Hip internal rotation     Hip external rotation     Knee flexion     Knee extension     Ankle dorsiflexion 5 4- 4  Ankle plantarflexion 5 4- 4P!  Ankle inversion 5 5   Ankle eversion 5 5    (Blank rows = not tested)  LOWER EXTREMITY SPECIAL TESTS:  Ankle special tests: Anterior drawer test: negative and Homan's test: negative  FUNCTIONAL TESTS:  5 times sit to stand: 10s arms crossed  GAIT: Distance walked: 41ft x2 Assistive device utilized: None Level of assistance: Complete Independence Comments: unremarkable   TODAY'S TREATMENT:     [facility] Adult PT Treatment:  DATE: 02/06/2023  Therapeutic Exercise: Nustep LEs only 5 min L3 Lateral Heel Taps 4" step 15x Heel raises off 4 in block 2 x10, 1/5 s con/ecc ratio Staggered stance heel raises x 10 each  Runners step up x 4 in 10/10 Balance board A/P and M/L 30s each Gastroc/soleus stretch 30s x2 B ea.   [facility] Adult PT Treatment:                                                DATE: 01/31/2023 Therapeutic Exercise: Step downs L 4 in block 10x Heel raises off 4 in block 10x 1/5 s con/ecc ratio L Heel raises 10x with pain reported Manual Therapy: Skilled palpation to identify taught and irritable bands in L lateral gastroc head  Trigger Point Dry Needling Treatment: Pre-treatment instruction: Patient instructed on dry needling rationale, procedures, and possible side effects including pain during treatment (achy,cramping feeling), bruising, drop of blood, lightheadedness, nausea, sweating. Patient Consent Given: Yes Education handout provided: No Muscles treated: L gastroc lateral head  Needle size and number: .30x61mm x 1 Electrical stimulation performed: No Parameters: N/A Treatment response/outcome: Twitch response elicited and Palpable decrease in muscle tension Post-treatment instructions: Patient instructed to  expect possible mild to moderate muscle soreness later today and/or tomorrow. Patient instructed in methods to reduce muscle soreness and to continue prescribed HEP. If patient was dry needled over the lung field, patient was instructed on signs and symptoms of pneumothorax and, however unlikely, to see immediate medical attention should they occur. Patient was also educated on signs and symptoms of infection and to seek medical attention should they occur. Patient verbalized understanding of these instructions and education.  Re-assessment   [facility] Adult PT Treatment:                                                DATE: 01/17/23 Therapeutic Exercise: [name] only 8 min L4 Gastroc/soleus stretch 30s x2 B ea. Runners step 4 in 10/10 Balance board A/P and M/L 60s each Manual Therapy: Skilled palpation to identify taught and irritable bands in L medial gastroc head Trigger Point Dry Needling Treatment: Pre-treatment instruction: Patient instructed on dry needling rationale, procedures, and possible side effects including pain during treatment (achy,cramping feeling), bruising, drop of blood, lightheadedness, nausea, sweating. Patient Consent Given: Yes Education handout provided: No Muscles treated: L medial gastroc head  Needle size and number: .30x82mm x 1 Electrical stimulation performed: No Parameters: N/A Treatment response/outcome: Twitch response elicited and Palpable decrease in muscle tension Post-treatment instructions: Patient instructed to expect possible mild to moderate muscle soreness later today and/or tomorrow. Patient instructed in methods to reduce muscle soreness and to continue prescribed HEP. If patient was dry needled over the lung field, patient was instructed on signs and symptoms of pneumothorax and, however unlikely, to see immediate medical attention should they occur. Patient was also educated on signs and symptoms of infection and to seek medical attention should they  occur. Patient verbalized understanding of these instructions and education.                     [facility] Adult PT Treatment:  DATE: 01/15/23 Therapeutic Exercise: NuStep, 5 min level 4 BAPS board - PF/DF, supination/pronation, CW, CCW x 30 sec each  Seated Heel/Toe Raises on airex, 2 x 10  Sit to stand, 2 x 10 on airex  Seated calf stretch with strap, 3 x 30 sec   Neuromuscular Re-ed: SLS, 2 x 30sec ankle isometric  Tandem stance on airex, 2 x 30 sec each                                                                                 [facility] Adult PT Treatment:                                                DATE: 01/12/2023  Therapeutic Exercise: NuStep, 5 min level 4 BAPS board - PF/DF, supination/pronation, CW, CCW x 30 sec each  Seated Heel/Toe Raises on airex, 2 x 10  Sit to stand, 2 x 10 on airex  SLS, 2 x 30sec ankle isometric  Tandem stance on airex, 2 x 30 sec each  Seated calf stretch with strap, 3 x 30 sec    PATIENT EDUCATION:  Education details: Discussed eval findings, rehab rationale and POC and patient is in agreement  Person educated: Patient Education method: Explanation Education comprehension: verbalized understanding and needs further education  HOME EXERCISE PROGRAM: Access Code: [email or web address] URL: [email or web address] Date: 01/31/2023 Prepared by: Gustavus Bryant  Exercises - Heel Raise on Step  - 1 x daily - 5 x weekly - 2 sets - 15 reps - Lateral Step Down  - 1 x daily - 5 x weekly - 1 sets - 15 reps - Single Leg Heel Raise  - 1 x daily - 5 x weekly - 1 sets - 10 reps   ASSESSMENT:  CLINICAL IMPRESSION: Patient tolerated small progressions of weightbearing activity well today, without adverse events or increased pain reported.  Recommend continuation of strengthening program, including eccentric training, and manual therapy as indicated to address TRP's present.    OBJECTIVE IMPAIRMENTS:  Abnormal gait, decreased activity tolerance, decreased balance, decreased endurance, decreased knowledge of condition, decreased mobility, difficulty walking, decreased strength, obesity, and pain.   ACTIVITY LIMITATIONS: carrying, lifting, squatting, and stairs  PERSONAL FACTORS: Age, Education, Fitness, Time since onset of injury/illness/exacerbation, and 1 comorbidity: DM  are also affecting patient's functional outcome.   REHAB POTENTIAL: Good  CLINICAL DECISION MAKING: Stable/uncomplicated  EVALUATION COMPLEXITY: Low   GOALS: Goals reviewed with patient? No  SHORT TERM GOALS: Target date: 01/10/2023   Patient to demonstrate independence in HEP  Baseline: [email or web address] Goal status: Ongoing  2.  Decrease worst pain to 6/10 Baseline: 8/10; 01/31/23 0/10 Goal status: Met   LONG TERM GOALS: Target date: 10/10/202   4/10 worst pain Baseline: 8/10 worst pain; 01/31/23  Goal status: Met  2.  Increase FOTO Score to 60 Baseline: 38; 01/31/23 99% Goal status: Met  3.  Increase PROM L DF to 15d Baseline:  A/PROM Right eval Left eval L  01/31/23  Hip flexion     Hip extension  Hip abduction     Hip adduction     Hip internal rotation     Hip external rotation     Knee flexion     Knee extension     Ankle dorsiflexion 12/15d 10/12dP! 12/15d  Ankle plantarflexion WNL WNL   Ankle inversion WNL WNL   Ankle eversion WNL WNL    Goal status: Ongoing  4.  Increase L ankl PF/DF strength to 4/5 Baseline:  MMT Right eval Left eval L 01/31/23  Hip flexion     Hip extension     Hip abduction     Hip adduction     Hip internal rotation     Hip external rotation     Knee flexion     Knee extension     Ankle dorsiflexion 5 4- 4  Ankle plantarflexion 5 4- 4P!  Ankle inversion 5 5   Ankle eversion 5 5    Goal status: Ongoing     PLAN:  PT FREQUENCY: 1x/week  PT DURATION: 4 weeks  PLANNED INTERVENTIONS: Therapeutic exercises, Therapeutic activity,  Neuromuscular re-education, Balance training, Gait training, Patient/Family education, Self Care, Joint mobilization, Stair training, DME instructions, Aquatic Therapy, Dry Needling, Manual therapy, and Re-evaluation  PLAN FOR NEXT SESSION: HEP review and update, manual techniques as appropriate, aerobic tasks, ROM and flexibility activities, strengthening and PREs, TPDN, gait and balance training as needed     Mauri Reading, PT, DPT   02/18/2023, 11:42 AM   Check all possible CPT codes: 82956 - PT Re-evaluation, 97110- Therapeutic Exercise, [phone]- Neuro Re-education, [phone] - Gait Training, [phone] - Manual Therapy, 97530 - Therapeutic Activities, 97535 - Self Care, [phone] - Iontophoresis, 97035 - Ultrasound, and [id number] - Aquatic therapy    Check all conditions that are expected to impact treatment: {Conditions expected to impact treatment:Diabetes mellitus   If treatment provided at initial evaluation, no treatment charged due to lack of authorization.

## 2023-02-06 NOTE — Progress Notes (Signed)
Endocrinology Follow Up Note       02/06/2023, 4:37 PM   Subjective:    Patient ID: Lori Shea, female    DOB: Jul 18, 1975.  Lori Shea is being seen in follow up after being seen in consultation for management of currently uncontrolled symptomatic diabetes requested by  Ivonne Andrew, NP.   Past Medical History:  Diagnosis Date   Allergy    Foods:  mango, honey, corn.  Allergic to "everything tested"  15 years ago in [place]   Anemia    with pregnancy   Anxiety    Arthritis    Shoulder and hips   Asthma age 47 yo   Triggers:  cold weather, exercise, allergens, anxiety, URIs   Cholelithiasis age 47 yo   Asymptomatic   Closed avulsion fracture of lateral malleolus of right fibula 12/27/2016   COVID-19    Diabetes mellitus type 2 2012   Was diagnosed 2 years earlier with PCOS   Elevated liver enzymes 09/13/2016   none since 2018   GERD (gastroesophageal reflux disease)    Heart murmur    with pregnancy 48 yrs ago per pt on 10-12-2021 per pt on 10-12-2021   History of kidney stones    Hypertension 2015   Kidney stones    family history of renal failure   Migraines 2009   Morbid obesity (HCC)    PCOS (polycystic ovarian syndrome) 2010   Pneumonia    several years ago   Wears glasses     Past Surgical History:  Procedure Laterality Date   CHOLECYSTECTOMY N/A 12/01/2019   Procedure: LAPAROSCOPIC CHOLECYSTECTOMY;  Surgeon: Darnell Level, MD;  Location: [facility];  Service: General;  Laterality: N/A;   colonscopy and endoscopy  05/25/2021   COLPOSCOPY N/A 2021   CYST EXCISION     DILITATION & CURRETTAGE/HYSTROSCOPY WITH NOVASURE ABLATION N/A 10/18/2021   Procedure: DILATATION & CURETTAGE/HYSTEROSCOPY WITH NOVASURE ABLATION;  Surgeon: [place] Bing, MD;  Location: [facility];  Service: Gynecology;  Laterality: N/A;   LAPAROSCOPIC TUBAL LIGATION Bilateral 01/28/2019   Procedure: LAPAROSCOPIC  TUBAL LIGATION AND PAP SMEAR;  Surgeon: Anderson Bing, MD;  Location: [facility];  Service: Gynecology;  Laterality: Bilateral;   TONSILLECTOMY AND ADENOIDECTOMY Bilateral 12/23/2018   Procedure: TONSILLECTOMY AND ADENOIDECTOMY;  Surgeon: Newman Pies, MD;  Location: [facility];  Service: ENT;  Laterality: Bilateral;    Social History   Socioeconomic History   Marital status: Divorced    Spouse name: Not on file   Number of children: 1   Years of education: some comm. college   Highest education level: Not on file  Occupational History   Occupation: CNA    Comment: [employer]  Tobacco Use   Smoking status: Never   Smokeless tobacco: Never  Vaping Use   Vaping status: Never Used  Substance and Sexual Activity   Alcohol use: No   Drug use: No   Sexual activity: Not Currently    Birth control/protection: Surgical    Comment: Sprintec.  Tubal ligation  Other Topics Concern   Not on file  Social History Narrative   Originally from [place], [place] to [place]  in 1988.   In [place] in 1999   Lives at home with daughter on [place].   Social Determinants of Health   Financial Resource Strain: Not on file  Food Insecurity: No Food Insecurity (10/02/2021)   Hunger Vital Sign    Worried About Running Out of Food in the Last Year: Never true    Ran Out of Food in the Last Year: Never true  Transportation Needs: No Transportation Needs (10/02/2021)   PRAPARE - Administrator, Civil Service (Medical): No    Lack of Transportation (Non-Medical): No  Physical Activity: Not on file  Stress: Not on file  Social Connections: Unknown (09/05/2021)   [facility]   Social Network    Social Network: Not on file    Family History  Problem Relation Age of Onset   Healthy Mother    Diabetes Father    Hyperlipidemia Father    Hypertension Father    Heart disease Father    Kidney  disease Father        Developed after 2nd CABG   Colon polyps Father    Diabetes Paternal Grandfather    Heart disease Paternal Grandfather    Hyperlipidemia Paternal Grandfather    Hypertension Paternal Grandfather    Kidney disease Paternal Grandfather     Outpatient Encounter Medications as of 02/06/2023  Medication Sig   albuterol (PROVENTIL) (2.5 MG/3ML) 0.083% nebulizer solution USE 1 VIAL VIA NEBULIZER EVERY 6 HOURS AS NEEDED FOR WHEEZING OR SHORTNESS OF BREATH   albuterol (VENTOLIN HFA) 108 (90 Base) MCG/ACT inhaler INHALE 2 PUFFS INTO THE LUNGS EVERY 4 HOURS AS NEEDED FOR WHEEZING OR SHORTNESS OF BREATH   amitriptyline (ELAVIL) 25 MG tablet TAKE 1 TABLET BY MOUTH AT BEDTIME   cholestyramine (QUESTRAN) 4 g packet Take 1 packet (4 g total) by mouth 2 (two) times daily.   Continuous Blood Gluc Sensor (FREESTYLE LIBRE 3 SENSOR) MISC Use to check glucose continuously and change sensor every 14 days.   cyclobenzaprine (FLEXERIL) 5 MG tablet Take 1 tablet (5 mg total) by mouth 3 (three) times daily as needed for muscle spasms.   fluticasone-salmeterol (ADVAIR DISKUS) 100-50 MCG/ACT AEPB Inhale 1 puff into the lungs 2 (two) times daily.   ibuprofen (ADVIL) 600 MG tablet Take 1 tablet (600 mg total) by mouth every 6 (six) hours as needed.   meloxicam (MOBIC) 7.5 MG tablet Take 1 tablet (7.5 mg total) by mouth daily.   metoCLOPramide (REGLAN) 5 MG tablet Take 1 tablet (5 mg total) by mouth 2 (two) times daily as needed for nausea.   montelukast (SINGULAIR) 10 MG tablet TAKE 1 TABLET BY MOUTH EVERY DAY   olmesartan-hydrochlorothiazide (BENICAR HCT) 40-25 MG tablet TAKE 1 TABLET BY MOUTH EVERY DAY   pantoprazole (PROTONIX) 40 MG tablet Take 1 tablet (40 mg total) by mouth 2 (two) times daily before a meal.   promethazine (PHENERGAN) 25 MG tablet Take 1 tablet (25 mg total) by mouth every 8 (eight) hours as needed.   rizatriptan (MAXALT-MLT) 10 MG disintegrating tablet Take 1 tablet (10 mg  total) by mouth as needed for migraine. May repeat in 2 hours if needed   [DISCONTINUED] insulin degludec (TRESIBA FLEXTOUCH) 100 UNIT/ML FlexTouch Pen Inject 60 Units into the skin at bedtime.   [DISCONTINUED] Insulin Pen Needle (PEN NEEDLES) 31G X 8 MM MISC Use to inject insulin 4 times daily   [DISCONTINUED] metFORMIN (GLUCOPHAGE) 500 MG tablet TAKE 1 TABLET  BY MOUTH TWICE DAILY WITH MEALS   [DISCONTINUED] NOVOLOG FLEXPEN 100 UNIT/ML FlexPen INJECT 10 TO 16 UNITS SUBCUTANEOUSLY 3 TIMES DAILY WITH MEALS   [DISCONTINUED] sitaGLIPtin (JANUVIA) 50 MG tablet Take 1 tablet (50 mg total) by mouth daily.   insulin aspart (NOVOLOG FLEXPEN) 100 UNIT/ML FlexPen Inject 16-22 Units into the skin 3 (three) times daily with meals.   insulin degludec (TRESIBA FLEXTOUCH) 100 UNIT/ML FlexTouch Pen Inject 80 Units into the skin at bedtime.   Insulin Pen Needle (PEN NEEDLES) 31G X 8 MM MISC Use to inject insulin 4 times daily   metFORMIN (GLUCOPHAGE) 500 MG tablet Take 1 tablet (500 mg total) by mouth 2 (two) times daily with a meal.   sitaGLIPtin (JANUVIA) 100 MG tablet Take 1 tablet (100 mg total) by mouth daily.   No facility-administered encounter medications on file as of 02/06/2023.    ALLERGIES: Allergies  Allergen Reactions   Corn-Containing Products Hives    "really bad asthma attacks"   Honey Shortness Of Breath   Mango Flavor Hives and Swelling    Pt allergic to all mango - tongue and throat swelling   Glipizide     Other reaction(s): Other bad yeast infection   Liraglutide Nausea And Vomiting    victoza    VACCINATION STATUS: Immunization History  Administered Date(s) Administered   Pneumococcal Polysaccharide-23 12/18/2018   Pneumococcal-Unspecified 04/23/1997   Td 09/06/2004   Tdap 09/13/2016    Diabetes She presents for her follow-up diabetic visit. She has type 2 diabetes mellitus. Onset time: diagnosed at approx age of 57. Her disease course has been worsening. There are no  hypoglycemic associated symptoms. There are no diabetic associated symptoms. Pertinent negatives for diabetes include no polyuria and no weight loss. There are no hypoglycemic complications. There are no diabetic complications. (Recently diagnosed with gastroparesis) Risk factors for coronary artery disease include diabetes mellitus, dyslipidemia, obesity, hypertension and family history. Current diabetic treatment includes oral agent (dual therapy) and intensive insulin program. She is compliant with treatment most of the time. Her weight is increasing steadily. She is following a generally healthy diet. When asked about meal planning, she reported none. She has not had a previous visit with a dietitian. She participates in exercise daily. Her home blood glucose trend is increasing steadily. Her overall blood glucose range is >200 mg/dl. (She presents today with her CGM showing above target, worsening glycemic profile overall.  Her POCT A1c today is 9.3%, increasing from last visit of 8.5%.  Analysis of her CGM shows TIR 1%, TAR 99%, TBR 0% with a GMI of 10.2%.  She did well on the Rybelsus but noticed her appetite was decreasing, eventually to where she was constipated and could not take in any food at all without having severe nausea and vomiting.  She has seen GI since and was diagnosed with gastroparesis which subsequently led to Rybelsus being discontinued.  She notes she is in a more stable area now as far as her gut goes, she does have an infected tooth just recently finished antibiotics with plans to remove the tooth in the future.) An ACE inhibitor/angiotensin II receptor blocker is being taken. She sees a podiatrist.Eye exam is current.     Review of systems  Constitutional: + decreasing body weight, current There is no height or weight on file to calculate BMI., no fatigue, no subjective hyperthermia, no subjective hypothermia Eyes: no blurry vision, no xerophthalmia ENT: no sore throat, no  nodules palpated in throat, no dysphagia/odynophagia,  no hoarseness Cardiovascular: no chest pain, no shortness of breath, no palpitations, no leg swelling Respiratory: no cough, no shortness of breath Gastrointestinal: no nausea/vomiting/diarrhea Musculoskeletal: no muscle/joint aches Skin: no rashes, no hyperemia Neurological: no tremors, no numbness, no tingling, no dizziness Psychiatric: no depression, no anxiety  Objective:     BP 118/78 (BP Location: Left Arm, Patient Position: Sitting, Cuff Size: Large)   Pulse 82   LMP 10/14/2021 (Exact Date)   Wt Readings from Last 3 Encounters:  12/19/22 275 lb 12.8 oz (125.1 kg)  12/10/22 270 lb 12.8 oz (122.8 kg)  11/06/22 272 lb 12.8 oz (123.7 kg)     BP Readings from Last 3 Encounters:  02/06/23 118/78  12/19/22 120/69  12/10/22 119/79      Physical Exam- Limited  Constitutional:  There is no height or weight on file to calculate BMI. , not in acute distress, normal state of mind Eyes:  EOMI, no exophthalmos Musculoskeletal: no gross deformities, strength intact in all four extremities, no gross restriction of joint movements Skin:  no rashes, no hyperemia Neurological: no tremor with outstretched hands   Diabetic Foot Exam - Simple   No data filed     CMP ( most recent) CMP     Component Value Date/Time   NA 140 12/13/2022 0841   NA 138 11/01/2022 1131   K 3.6 12/13/2022 0841   CL 106 12/13/2022 0841   CO2 23 12/13/2022 0841   GLUCOSE 156 (H) 12/13/2022 0841   BUN 18 12/13/2022 0841   BUN 13 11/01/2022 1131   CREATININE 0.72 12/13/2022 0841   CALCIUM 8.9 12/13/2022 0841   PROT 6.8 12/13/2022 0841   PROT 6.9 11/01/2022 1131   ALBUMIN 3.8 12/13/2022 0841   ALBUMIN 4.2 11/01/2022 1131   AST 23 12/13/2022 0841   ALT 24 12/13/2022 0841   ALKPHOS 77 12/13/2022 0841   BILITOT 0.4 12/13/2022 0841   BILITOT 0.4 11/01/2022 1131   GFRNONAA >60 01/04/2021 1249   GFRAA 119 03/03/2020 1246     Diabetic Labs (most  recent): Lab Results  Component Value Date   HGBA1C 9.3 (A) 02/06/2023   HGBA1C 8.5 (A) 10/10/2022   HGBA1C 7.7 (A) 04/25/2022   MICROALBUR 4.03 (H) 02/09/2009     Lipid Panel ( most recent) Lipid Panel     Component Value Date/Time   CHOL 181 11/01/2022 1131   TRIG 205 (H) 11/01/2022 1131   HDL 37 (L) 11/01/2022 1131   CHOLHDL 4.9 (H) 11/01/2022 1131   CHOLHDL 3.6 Ratio 02/09/2009 2112   VLDL 36 02/09/2009 2112   LDLCALC 108 (H) 11/01/2022 1131   LABVLDL 36 11/01/2022 1131      Lab Results  Component Value Date   TSH 3.770 11/01/2022   TSH 2.780 02/11/2020   TSH 5.090 (H) 02/10/2018   TSH 4.370 04/08/2017   TSH 3.370 02/22/2011   TSH 2.259 02/07/2010   TSH 1.924 02/09/2009   FREET4 1.05 11/01/2022   FREET4 1.14 02/19/2018           Assessment & Plan:   1) Type 2 diabetes mellitus with hyperglycemia, with long-term current use of insulin (HCC)  She presents today with her CGM showing above target, worsening glycemic profile overall.  Her POCT A1c today is 9.3%, increasing from last visit of 8.5%.  Analysis of her CGM shows TIR 1%, TAR 99%, TBR 0% with a GMI of 10.2%.  She did well on the Rybelsus but noticed her appetite was decreasing, eventually  to where she was constipated and could not take in any food at all without having severe nausea and vomiting.  She has seen GI since and was diagnosed with gastroparesis which subsequently led to Rybelsus being discontinued.  She notes she is in a more stable area now as far as her gut goes, she does have an infected tooth just recently finished antibiotics with plans to remove the tooth in the future.  - Lori Shea has currently uncontrolled symptomatic type 2 DM since 47 years of age.   -Recent labs reviewed.  - I had a long discussion with her about the progressive nature of diabetes and the pathology behind its complications. -her diabetes is not currently complicated but she remains at a high risk for more  acute and chronic complications which include CAD, CVA, CKD, retinopathy, and neuropathy. These are all discussed in detail with her.  The following Lifestyle Medicine recommendations according to American College of Lifestyle Medicine [facility]) were discussed and offered to patient and she agrees to start the journey:  A. Whole Foods, Plant-based plate comprising of fruits and vegetables, plant-based proteins, whole-grain carbohydrates was discussed in detail with the patient.   A list for source of those nutrients were also provided to the patient.  Patient will use only water or unsweetened tea for hydration. B.  The need to stay away from risky substances including alcohol, smoking; obtaining 7 to 9 hours of restorative sleep, at least 150 minutes of moderate intensity exercise weekly, the importance of healthy social connections,  and stress reduction techniques were discussed. C.  A full color page of  Calorie density of various food groups per pound showing examples of each food groups was provided to the patient.  - Nutritional counseling repeated at each appointment due to patients tendency to fall back in to old habits.  - The patient admits there is a room for improvement in their diet and drink choices. -  Suggestion is made for the patient to avoid simple carbohydrates from their diet including Cakes, Sweet Desserts / Pastries, Ice Cream, Soda (diet and regular), Sweet Tea, Candies, Chips, Cookies, Sweet Pastries, Store Bought Juices, Alcohol in Excess of 1-2 drinks a day, Artificial Sweeteners, Coffee Creamer, and "Sugar-free" Products. This will help patient to have stable blood glucose profile and potentially avoid unintended weight gain.   - I encouraged the patient to switch to unprocessed or minimally processed complex starch and increased protein intake (animal or plant source), fruits, and vegetables.   - Patient is advised to stick to a routine mealtimes to eat 3 meals a day and  avoid unnecessary snacks (to snack only to correct hypoglycemia).  - I have approached her with the following individualized plan to manage her diabetes and patient agrees:   -She is advised to continue her Tresiba 80 units SQ nightly and continue Novolog 16-22 units TID with meals if glucose is above 90 and she is eating (Specific instructions on how to titrate insulin dosage based on glucose readings given to patient in writing).  She can also continue Metformin 500 mg po twice daily with meals (would not likely tolerate any increase) and will increase her Januvia to 100 mg po daily before breakfast for now.    -she is encouraged to start monitoring glucose 4 times daily (using her CGM), before meals and before bed and to call the clinic if she has readings less than 70 or above 300 for 3 tests in a row.    -  she is warned not to take insulin without proper monitoring per orders. - Adjustment parameters are given to her for hypo and hyperglycemia in writing.  - her Marcelline Deist was previously discontinued, risk outweighs benefit for this patient.  Has history of vaginal yeast infections as a result of this medication.  - Specific targets for  A1c; LDL, HDL, and Triglycerides were discussed with the patient.  2) Blood Pressure /Hypertension:  her blood pressure is controlled to target.   she is advised to continue her current medications including Benicar 40-25 mg po daily.  3) Lipids/Hyperlipidemia:    Review of her recent lipid panel from 11/01/22 showed uncontrolled LDL at 108 and elevated triglycerides of 205 (worsening).  She is not currently on any lipid lowering medications, was on Simvastatin but stopped after recent recall.    4)  Weight/Diet:  her There is no height or weight on file to calculate BMI.  -  clearly complicating her diabetes care.   she is a candidate for weight loss. I discussed with her the fact that loss of 5 - 10% of her  current body weight will have the most impact on  her diabetes management.  Exercise, and detailed carbohydrates information provided  -  detailed on discharge instructions.  5) Chronic Care/Health Maintenance: -she is on ACEI/ARB and not currently on Statin medications and is encouraged to initiate and continue to follow up with Ophthalmology, Dentist, Podiatrist at least yearly or according to recommendations, and advised to stay away from smoking. I have recommended yearly flu vaccine and pneumonia vaccine at least every 5 years; moderate intensity exercise for up to 150 minutes weekly; and sleep for at least 7 hours a day.  - she is advised to maintain close follow up with Ivonne Andrew, NP for primary care needs, as well as her other providers for optimal and coordinated care.      I spent  43  minutes in the care of the patient today including review of labs from CMP, Lipids, Thyroid Function, Hematology (current and previous including abstractions from other facilities); face-to-face time discussing  her blood glucose readings/logs, discussing hypoglycemia and hyperglycemia episodes and symptoms, medications doses, her options of short and long term treatment based on the latest standards of care / guidelines;  discussion about incorporating lifestyle medicine;  and documenting the encounter. Risk reduction counseling performed per USPSTF guidelines to reduce obesity and cardiovascular risk factors.     Please refer to Patient Instructions for Blood Glucose Monitoring and Insulin/Medications Dosing Guide"  in media tab for additional information. Please  also refer to " Patient Self Inventory" in the Media  tab for reviewed elements of pertinent patient history.  Bjorn Pippin participated in the discussions, expressed understanding, and voiced agreement with the above plans.  All questions were answered to her satisfaction. she is encouraged to contact clinic should she have any questions or concerns prior to her return  visit.     Follow up plan: - Return in about 3 months (around 05/09/2023) for Diabetes F/U with A1c in office, No previsit labs, Bring meter and logs.   Ronny Bacon, [facility] [address] [place], [place] [address] Phone: [phone] Fax: [phone]  02/06/2023, 4:37 PM

## 2023-02-12 ENCOUNTER — Telehealth: Payer: Self-pay | Admitting: *Deleted

## 2023-02-12 ENCOUNTER — Ambulatory Visit: Payer: 59

## 2023-02-13 ENCOUNTER — Other Ambulatory Visit: Payer: Self-pay

## 2023-02-13 MED ORDER — FREESTYLE LIBRE 3 SENSOR MISC: 3 refills | Status: DC

## 2023-02-13 MED ORDER — NOVOLOG FLEXPEN 100 UNIT/ML ~~LOC~~ SOPN: 16.0000 [IU] | PEN_INJECTOR | Freq: Three times a day (TID) | SUBCUTANEOUS | 0 refills | Status: DC

## 2023-02-13 MED ORDER — SITAGLIPTIN PHOSPHATE 100 MG PO TABS: 100.0000 mg | ORAL_TABLET | Freq: Every day | ORAL | 0 refills | Status: DC

## 2023-02-13 MED ORDER — TRESIBA FLEXTOUCH 100 UNIT/ML ~~LOC~~ SOPN: 80.0000 [IU] | PEN_INJECTOR | Freq: Every day | SUBCUTANEOUS | 0 refills | Status: DC

## 2023-02-13 NOTE — Telephone Encounter (Signed)
I returned the call to [facility] . They were needing verification about the patient's Rybelsus  14 mg daily.  This medication had been discontinued due to GI issues.

## 2023-02-14 ENCOUNTER — Ambulatory Visit: Payer: 59

## 2023-02-14 ENCOUNTER — Other Ambulatory Visit: Payer: Self-pay

## 2023-02-14 MED ORDER — FREESTYLE LIBRE 3 PLUS SENSOR MISC: 1 refills | Status: DC

## 2023-02-14 NOTE — Therapy (Deleted)
OUTPATIENT PHYSICAL THERAPY TREATMENT NOTE   Patient Name: Lori Shea MRN: [id number] DOB:1975/11/29, 47 y.o., female Today's Date: 02/14/2023  END OF SESSION:        Past Medical History:  Diagnosis Date   Allergy    Foods:  mango, honey, corn.  Allergic to "everything tested"  15 years ago in [place]   Anemia    with pregnancy   Anxiety    Arthritis    Shoulder and hips   Asthma age 28 yo   Triggers:  cold weather, exercise, allergens, anxiety, URIs   Cholelithiasis age 21 yo   Asymptomatic   Closed avulsion fracture of lateral malleolus of right fibula 12/27/2016   COVID-19    Diabetes mellitus type 2 2012   Was diagnosed 2 years earlier with PCOS   Elevated liver enzymes 09/13/2016   none since 2018   GERD (gastroesophageal reflux disease)    Heart murmur    with pregnancy 20 yrs ago per pt on 10-12-2021   History of kidney stones    Hypertension 2015   Kidney stones    family history of renal failure   Migraines 2009   Morbid obesity (HCC)    PCOS (polycystic ovarian syndrome) 2010   Pneumonia    several years ago   Wears glasses    Past Surgical History:  Procedure Laterality Date   CHOLECYSTECTOMY N/A 12/01/2019   Procedure: LAPAROSCOPIC CHOLECYSTECTOMY;  Surgeon: Darnell Level, MD;  Location: [facility];  Service: General;  Laterality: N/A;   colonscopy and endoscopy  05/25/2021   COLPOSCOPY N/A 2021   CYST EXCISION     DILITATION & CURRETTAGE/HYSTROSCOPY WITH NOVASURE ABLATION N/A 10/18/2021   Procedure: DILATATION & CURETTAGE/HYSTEROSCOPY WITH NOVASURE ABLATION;  Surgeon: Cicero Bing, MD;  Location: [facility];  Service: Gynecology;  Laterality: N/A;   LAPAROSCOPIC TUBAL LIGATION Bilateral 01/28/2019   Procedure: LAPAROSCOPIC TUBAL LIGATION AND PAP SMEAR;  Surgeon: Coolidge Bing, MD;  Location: [facility];  Service: Gynecology;  Laterality: Bilateral;   TONSILLECTOMY AND ADENOIDECTOMY Bilateral 12/23/2018    Procedure: TONSILLECTOMY AND ADENOIDECTOMY;  Surgeon: Newman Pies, MD;  Location: [facility];  Service: ENT;  Laterality: Bilateral;   Patient Active Problem List   Diagnosis Date Noted   Left upper arm pain 12/10/2022   Soft tissue infection 12/10/2022   UTI symptoms 12/10/2022   Chronic diarrhea 01/25/2022   History of colonic polyps 01/25/2022   Gastroesophageal reflux disease 01/25/2022   Chronic nausea 01/25/2022   Gastritis without bleeding 01/25/2022   Stress incontinence of urine 01/02/2021   Skin irritation 01/02/2021   Hepatic steatosis 12/01/2019   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/2019   Hemoglobin A1C greater than 9%, indicating poor diabetic control 03/23/2019   Hyperglycemia 03/23/2019   Class 3 severe obesity due to excess calories with serious comorbidity and body mass index (BMI) of 40.0 to 44.9 in adult (HCC) 03/23/2019   Yeast infection 03/23/2019   BMI 40.0-44.9, adult (HCC) 03/09/2019   Cervical dysplasia 07/31/2017   Alopecia of scalp 05/09/2017   Pain in left toe(s) 12/27/2016   Morbid obesity (HCC) 09/13/2016   Elevated liver enzymes 09/13/2016   Allergy    Asthma    Hypertension 04/23/2013   Irregular periods 02/22/2011   Type 2 diabetes mellitus with hyperglycemia, with long-term current use of insulin (HCC) 04/23/2010   PCOS (polycystic ovarian syndrome) 04/23/2008   Migraines 04/24/2007    PCP: Ivonne Andrew, NP   REFERRING PROVIDER:  McCaughan, Dia D, DPM  REFERRING DIAG: [phone] (ICD-10-CM) - Achilles tendonitis, bilateral  THERAPY DIAG:  No diagnosis found.  Rationale for Evaluation and Treatment: Rehabilitation  ONSET DATE: chronic  SUBJECTIVE:   SUBJECTIVE STATEMENT:   Patient continues to respond well to physical therapy, feels that current plan of care is effective including dry needling.  PERTINENT HISTORY:  Foot Problem      Pt states she I just sore right now no actual pain, she is here  for diabetic exam and achilles tendinitis follow up.    HPI: 47 y.o. female presenting today for follow-up of left Achilles tendinitis.  She notes that she was doing pretty well but recently had a flareup.  She wears her cam walker when it is painful and has improvement of her symptoms.   Patient is diabetic.  She denies any numbness or burning in her feet. PAIN:  Are you having pain? Yes: NPRS scale: 2/10 Pain location: L heel Pain description: ache  Aggravating factors: prolonged walking Relieving factors: rest. Ice compression   PRECAUTIONS: None  RED FLAGS: None   WEIGHT BEARING RESTRICTIONS: No  FALLS:  Has patient fallen in last 6 months? No  OCCUPATION: CNA  PLOF: Independent  PATIENT GOALS: To manage my ankle pain  NEXT MD VISIT: 6 weeks  OBJECTIVE:   DIAGNOSTIC FINDINGS: none  PATIENT SURVEYS:  FOTO 38(60 predicted) 01/31/23 99%  MUSCLE LENGTH: N/A  POSTURE:  pes planus  PALPATION: TTP lateral achilles insertion into calcaneus.  LOWER EXTREMITY ROM:  A/PROM Right eval Left eval L  01/31/23  Hip flexion     Hip extension     Hip abduction     Hip adduction     Hip internal rotation     Hip external rotation     Knee flexion     Knee extension     Ankle dorsiflexion 12/15d 10/12dP! 12/15d  Ankle plantarflexion WNL WNL   Ankle inversion WNL WNL   Ankle eversion WNL WNL    (Blank rows = not tested)  LOWER EXTREMITY MMT:  MMT Right eval Left eval L 01/31/23  Hip flexion     Hip extension     Hip abduction     Hip adduction     Hip internal rotation     Hip external rotation     Knee flexion     Knee extension     Ankle dorsiflexion 5 4- 4  Ankle plantarflexion 5 4- 4P!  Ankle inversion 5 5   Ankle eversion 5 5    (Blank rows = not tested)  LOWER EXTREMITY SPECIAL TESTS:  Ankle special tests: Anterior drawer test: negative and Homan's test: negative  FUNCTIONAL TESTS:  5 times sit to stand: 10s arms crossed  GAIT: Distance  walked: 55ft x2 Assistive device utilized: None Level of assistance: Complete Independence Comments: unremarkable   TODAY'S TREATMENT:     [facility] Adult PT Treatment:                                                DATE: 02/06/2023  Therapeutic Exercise: Nustep LEs only 5 min L3 Lateral Heel Taps 4" step 15x Heel raises off 4 in block 2 x10, 1/5 s con/ecc ratio Staggered stance heel raises x 10 each  Runners step up x 4 in 10/10 Balance board A/P and M/L 30s  each Gastroc/soleus stretch 30s x2 B ea.   [facility] Adult PT Treatment:                                                DATE: 01/31/2023 Therapeutic Exercise: Step downs L 4 in block 10x Heel raises off 4 in block 10x 1/5 s con/ecc ratio L Heel raises 10x with pain reported Manual Therapy: Skilled palpation to identify taught and irritable bands in L lateral gastroc head  Trigger Point Dry Needling Treatment: Pre-treatment instruction: Patient instructed on dry needling rationale, procedures, and possible side effects including pain during treatment (achy,cramping feeling), bruising, drop of blood, lightheadedness, nausea, sweating. Patient Consent Given: Yes Education handout provided: No Muscles treated: L gastroc lateral head  Needle size and number: .30x63mm x 1 Electrical stimulation performed: No Parameters: N/A Treatment response/outcome: Twitch response elicited and Palpable decrease in muscle tension Post-treatment instructions: Patient instructed to expect possible mild to moderate muscle soreness later today and/or tomorrow. Patient instructed in methods to reduce muscle soreness and to continue prescribed HEP. If patient was dry needled over the lung field, patient was instructed on signs and symptoms of pneumothorax and, however unlikely, to see immediate medical attention should they occur. Patient was also educated on signs and symptoms of infection and to seek medical attention should they occur. Patient verbalized  understanding of these instructions and education.  Re-assessment   [facility] Adult PT Treatment:                                                DATE: 01/17/23 Therapeutic Exercise: [name] only 8 min L4 Gastroc/soleus stretch 30s x2 B ea. Runners step 4 in 10/10 Balance board A/P and M/L 60s each Manual Therapy: Skilled palpation to identify taught and irritable bands in L medial gastroc head Trigger Point Dry Needling Treatment: Pre-treatment instruction: Patient instructed on dry needling rationale, procedures, and possible side effects including pain during treatment (achy,cramping feeling), bruising, drop of blood, lightheadedness, nausea, sweating. Patient Consent Given: Yes Education handout provided: No Muscles treated: L medial gastroc head  Needle size and number: .30x9mm x 1 Electrical stimulation performed: No Parameters: N/A Treatment response/outcome: Twitch response elicited and Palpable decrease in muscle tension Post-treatment instructions: Patient instructed to expect possible mild to moderate muscle soreness later today and/or tomorrow. Patient instructed in methods to reduce muscle soreness and to continue prescribed HEP. If patient was dry needled over the lung field, patient was instructed on signs and symptoms of pneumothorax and, however unlikely, to see immediate medical attention should they occur. Patient was also educated on signs and symptoms of infection and to seek medical attention should they occur. Patient verbalized understanding of these instructions and education.                     [facility] Adult PT Treatment:                                                DATE: 01/15/23 Therapeutic Exercise: NuStep, 5 min level 4 BAPS board - PF/DF, supination/pronation, CW, CCW x  30 sec each  Seated Heel/Toe Raises on airex, 2 x 10  Sit to stand, 2 x 10 on airex  Seated calf stretch with strap, 3 x 30 sec   Neuromuscular Re-ed: SLS, 2 x 30sec ankle isometric  Tandem  stance on airex, 2 x 30 sec each                                                                                 [facility] Adult PT Treatment:                                                DATE: 01/12/2023  Therapeutic Exercise: NuStep, 5 min level 4 BAPS board - PF/DF, supination/pronation, CW, CCW x 30 sec each  Seated Heel/Toe Raises on airex, 2 x 10  Sit to stand, 2 x 10 on airex  SLS, 2 x 30sec ankle isometric  Tandem stance on airex, 2 x 30 sec each  Seated calf stretch with strap, 3 x 30 sec    PATIENT EDUCATION:  Education details: Discussed eval findings, rehab rationale and POC and patient is in agreement  Person educated: Patient Education method: Explanation Education comprehension: verbalized understanding and needs further education  HOME EXERCISE PROGRAM: Access Code: [email or web address] URL: [email or web address] Date: 01/31/2023 Prepared by: Gustavus Bryant  Exercises - Heel Raise on Step  - 1 x daily - 5 x weekly - 2 sets - 15 reps - Lateral Step Down  - 1 x daily - 5 x weekly - 1 sets - 15 reps - Single Leg Heel Raise  - 1 x daily - 5 x weekly - 1 sets - 10 reps   ASSESSMENT:  CLINICAL IMPRESSION: Patient tolerated small progressions of weightbearing activity well today, without adverse events or increased pain reported.  Recommend continuation of strengthening program, including eccentric training, and manual therapy as indicated to address TRP's present.    OBJECTIVE IMPAIRMENTS: Abnormal gait, decreased activity tolerance, decreased balance, decreased endurance, decreased knowledge of condition, decreased mobility, difficulty walking, decreased strength, obesity, and pain.   ACTIVITY LIMITATIONS: carrying, lifting, squatting, and stairs  PERSONAL FACTORS: Age, Education, Fitness, Time since onset of injury/illness/exacerbation, and 1 comorbidity: DM  are also affecting patient's functional outcome.   REHAB POTENTIAL: Good  CLINICAL DECISION MAKING:  Stable/uncomplicated  EVALUATION COMPLEXITY: Low   GOALS: Goals reviewed with patient? No  SHORT TERM GOALS: Target date: 01/10/2023   Patient to demonstrate independence in HEP  Baseline: [id number] Goal status: Ongoing  2.  Decrease worst pain to 6/10 Baseline: 8/10; 01/31/23 0/10 Goal status: Met   LONG TERM GOALS: Target date: 10/10/202   4/10 worst pain Baseline: 8/10 worst pain; 01/31/23  Goal status: Met  2.  Increase FOTO Score to 60 Baseline: 38; 01/31/23 99% Goal status: Met  3.  Increase PROM L DF to 15d Baseline:  A/PROM Right eval Left eval L  01/31/23  Hip flexion     Hip extension     Hip abduction     Hip adduction     Hip internal  rotation     Hip external rotation     Knee flexion     Knee extension     Ankle dorsiflexion 12/15d 10/12dP! 12/15d  Ankle plantarflexion WNL WNL   Ankle inversion WNL WNL   Ankle eversion WNL WNL    Goal status: Ongoing  4.  Increase L ankl PF/DF strength to 4/5 Baseline:  MMT Right eval Left eval L 01/31/23  Hip flexion     Hip extension     Hip abduction     Hip adduction     Hip internal rotation     Hip external rotation     Knee flexion     Knee extension     Ankle dorsiflexion 5 4- 4  Ankle plantarflexion 5 4- 4P!  Ankle inversion 5 5   Ankle eversion 5 5    Goal status: Ongoing     PLAN:  PT FREQUENCY: 1x/week  PT DURATION: 4 weeks  PLANNED INTERVENTIONS: Therapeutic exercises, Therapeutic activity, Neuromuscular re-education, Balance training, Gait training, Patient/Family education, Self Care, Joint mobilization, Stair training, DME instructions, Aquatic Therapy, Dry Needling, Manual therapy, and Re-evaluation  PLAN FOR NEXT SESSION: HEP review and update, manual techniques as appropriate, aerobic tasks, ROM and flexibility activities, strengthening and PREs, TPDN, gait and balance training as needed     Mauri Reading, PT, DPT   02/14/2023, 10:13 AM   Check all possible CPT  codes: [id number] - PT Re-evaluation, 97110- Therapeutic Exercise, [phone]- Neuro Re-education, [phone] - Gait Training, [phone] - Manual Therapy, 97530 - Therapeutic Activities, 97535 - Self Care, [phone] - Iontophoresis, 97035 - Ultrasound, and [id number] - Aquatic therapy    Check all conditions that are expected to impact treatment: {Conditions expected to impact treatment:Diabetes mellitus   If treatment provided at initial evaluation, no treatment charged due to lack of authorization.

## 2023-02-15 ENCOUNTER — Other Ambulatory Visit: Payer: Self-pay | Admitting: Nurse Practitioner

## 2023-02-15 MED ORDER — CEPHALEXIN 500 MG PO CAPS: 500.0000 mg | ORAL_CAPSULE | Freq: Two times a day (BID) | ORAL | 0 refills | Status: AC

## 2023-02-19 ENCOUNTER — Ambulatory Visit: Payer: 59

## 2023-02-19 ENCOUNTER — Other Ambulatory Visit: Payer: Self-pay | Admitting: Nurse Practitioner

## 2023-02-19 ENCOUNTER — Other Ambulatory Visit: Payer: Self-pay

## 2023-02-20 ENCOUNTER — Other Ambulatory Visit: Payer: Self-pay

## 2023-02-20 MED ORDER — SITAGLIPTIN PHOSPHATE 100 MG PO TABS: 100.0000 mg | ORAL_TABLET | Freq: Every day | ORAL | 0 refills | Status: DC

## 2023-02-20 MED ORDER — NOVOLOG FLEXPEN 100 UNIT/ML ~~LOC~~ SOPN: 16.0000 [IU] | PEN_INJECTOR | Freq: Three times a day (TID) | SUBCUTANEOUS | 0 refills | Status: DC

## 2023-02-20 MED ORDER — TRESIBA FLEXTOUCH 100 UNIT/ML ~~LOC~~ SOPN: 80.0000 [IU] | PEN_INJECTOR | Freq: Every day | SUBCUTANEOUS | 0 refills | Status: DC

## 2023-02-26 ENCOUNTER — Other Ambulatory Visit: Payer: Self-pay

## 2023-02-26 DIAGNOSIS — M2550 Pain in unspecified joint: Secondary | ICD-10-CM

## 2023-02-26 MED ORDER — SITAGLIPTIN PHOSPHATE 100 MG PO TABS: 100.0000 mg | ORAL_TABLET | Freq: Every day | ORAL | 0 refills | Status: DC

## 2023-02-26 MED ORDER — NOVOLOG FLEXPEN 100 UNIT/ML ~~LOC~~ SOPN: 16.0000 [IU] | PEN_INJECTOR | Freq: Three times a day (TID) | SUBCUTANEOUS | 0 refills | Status: DC

## 2023-02-26 MED ORDER — TRESIBA FLEXTOUCH 100 UNIT/ML ~~LOC~~ SOPN: 80.0000 [IU] | PEN_INJECTOR | Freq: Every day | SUBCUTANEOUS | 0 refills | Status: DC

## 2023-03-01 ENCOUNTER — Other Ambulatory Visit: Payer: Self-pay | Admitting: Nurse Practitioner

## 2023-03-01 DIAGNOSIS — G4700 Insomnia, unspecified: Secondary | ICD-10-CM

## 2023-03-13 ENCOUNTER — Other Ambulatory Visit: Payer: Self-pay | Admitting: Nurse Practitioner

## 2023-03-13 DIAGNOSIS — J4530 Mild persistent asthma, uncomplicated: Secondary | ICD-10-CM

## 2023-04-02 ENCOUNTER — Other Ambulatory Visit: Payer: Self-pay | Admitting: Nurse Practitioner

## 2023-04-02 DIAGNOSIS — G4700 Insomnia, unspecified: Secondary | ICD-10-CM

## 2023-04-09 ENCOUNTER — Ambulatory Visit: Payer: 59 | Admitting: Obstetrics and Gynecology

## 2023-04-09 ENCOUNTER — Other Ambulatory Visit (HOSPITAL_COMMUNITY)
Admission: RE | Admit: 2023-04-09 | Discharge: 2023-04-09 | Disposition: A | Discharge status: Home / Self Care | Payer: 59 | Source: Ambulatory Visit | Attending: Obstetrics and Gynecology | Admitting: Obstetrics and Gynecology

## 2023-04-09 ENCOUNTER — Encounter: Payer: Self-pay | Admitting: Obstetrics and Gynecology

## 2023-04-09 VITALS — BP 119/87 | HR 90 | Wt 277.3 lb

## 2023-04-09 DIAGNOSIS — R109 Unspecified abdominal pain: Secondary | ICD-10-CM | POA: Diagnosis not present

## 2023-04-09 DIAGNOSIS — Z113 Encounter for screening for infections with a predominantly sexual mode of transmission: Secondary | ICD-10-CM | POA: Diagnosis not present

## 2023-04-09 DIAGNOSIS — Z1331 Encounter for screening for depression: Secondary | ICD-10-CM

## 2023-04-09 DIAGNOSIS — Z202 Contact with and (suspected) exposure to infections with a predominantly sexual mode of transmission: Secondary | ICD-10-CM

## 2023-04-09 NOTE — Progress Notes (Signed)
GYNECOLOGY VISIT  Patient name: Lori Shea MRN [id number]  Date of birth: December 24, 1975 Chief Complaint:   Gynecologic Exam   History:  Lori Shea is a 47 y.o. [phone] being seen today for "bump on vagina" for a few weeks. Not seen this morning. Hasn't messed with ex and he had an HSV outbreak and she is rquesting STI testing. Area was not an open wound, did not hurt, and no darinage. HPV a long time ago. .    Had the ablation and hadn't had a period since but gets occasional pain  Will have pain during expected time of menses Previously had painful and heavy periods then ablation stopped lfow Didn't need flexeril anymore  Not super bothered by pain and no pain with intercourse   Past Medical History:  Diagnosis Date   Allergy    Foods:  mango, honey, corn.  Allergic to "everything tested"  15 years ago in [place]   Anemia    with pregnancy   Anxiety    Arthritis    Shoulder and hips   Asthma age 54 yo   Triggers:  cold weather, exercise, allergens, anxiety, URIs   Cholelithiasis age 56 yo   Asymptomatic   Closed avulsion fracture of lateral malleolus of right fibula 12/27/2016   COVID-19    Diabetes mellitus type 2 2012   Was diagnosed 2 years earlier with PCOS   Elevated liver enzymes 09/13/2016   none since 2018   GERD (gastroesophageal reflux disease)    Heart murmur    with pregnancy 20 yrs ago per pt on 10-12-2021   History of kidney stones    Hypertension 2015   Kidney stones    family history of renal failure   Migraines 2009   Morbid obesity (HCC)    PCOS (polycystic ovarian syndrome) 2010   Pneumonia    several years ago   Wears glasses     Past Surgical History:  Procedure Laterality Date   CHOLECYSTECTOMY N/A 12/01/2019   Procedure: LAPAROSCOPIC CHOLECYSTECTOMY;  Surgeon: Darnell Level, MD;  Location: [facility];  Service: General;  Laterality: N/A;   colonscopy and endoscopy  05/25/2021   COLPOSCOPY N/A 2021   CYST EXCISION     DILITATION  & CURRETTAGE/HYSTROSCOPY WITH NOVASURE ABLATION N/A 10/18/2021   Procedure: DILATATION & CURETTAGE/HYSTEROSCOPY WITH NOVASURE ABLATION;  Surgeon: Landa Bing, MD;  Location: [facility];  Service: Gynecology;  Laterality: N/A;   LAPAROSCOPIC TUBAL LIGATION Bilateral 01/28/2019   Procedure: LAPAROSCOPIC TUBAL LIGATION AND PAP SMEAR;  Surgeon: Jolly Bing, MD;  Location: [facility];  Service: Gynecology;  Laterality: Bilateral;   TONSILLECTOMY AND ADENOIDECTOMY Bilateral 12/23/2018   Procedure: TONSILLECTOMY AND ADENOIDECTOMY;  Surgeon: Newman Pies, MD;  Location: [facility];  Service: ENT;  Laterality: Bilateral;    The following portions of the patient's history were reviewed and updated as appropriate: allergies, current medications, past family history, past medical history, past social history, past surgical history and problem list.   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   ADEQPAP  01/02/2021 0931    Satisfactory but limited for evaluation with partially obscuring   ADEQPAP  01/02/2021 0931    inflammation; transformation zone component present.    High Risk HPV: Positive  Adequacy:  Satisfactory for evaluation, transformation zone component PRESENT  Diagnosis:  Atypical squamous cells of undetermined significance (ASC-US)  Last mammogram: 12/2020 BIRADS 0 > [place] BIRADS 2   Review of Systems:  Pertinent items are noted in  HPI. Comprehensive review of systems was otherwise negative.   Objective:  Physical Exam BP 119/87   Pulse 90   Wt 277 lb 4.8 oz (125.8 kg)   LMP 10/14/2021 (Exact Date)   BMI 46.15 kg/m    Physical Exam Vitals and nursing note reviewed. Exam conducted with a chaperone present.  Constitutional:      Appearance: Normal appearance.  HENT:     Head: Normocephalic and atraumatic.  Pulmonary:     Effort: Pulmonary effort is normal.     Breath sounds: Normal breath sounds.  Genitourinary:    General: Normal vulva.     Exam position: Lithotomy position.     Vagina: Normal.     Cervix: Normal.     Comments: Mild tenderness throughout pelvic floor Skin:    General: Skin is warm and dry.  Neurological:     General: No focal deficit present.     Mental Status: She is alert.  Psychiatric:        Mood and Affect: Mood normal.        Behavior: Behavior normal.        Thought Content: Thought content normal.        Judgment: Judgment normal.        Assessment & Plan:   1. STD exposure (Primary) No lesion noted on exam. Mild tenderness on exam but no pain with intercourse, no intervention needed.  - Cervicovaginal ancillary only   2. Screening examination for STI - RPR+HBsAg+HCVAb+...; Future  3. Abdominal cramping Occurs during anticipated menses - not too bothered. Recommend scheduled NSAIDs when having the cramping.     Routine preventative health maintenance measures emphasized.  Lorriane Shire, MD [facility], [facility]

## 2023-04-11 ENCOUNTER — Ambulatory Visit: Payer: 59 | Admitting: Nurse Practitioner

## 2023-04-11 ENCOUNTER — Encounter: Payer: Self-pay | Admitting: Nurse Practitioner

## 2023-04-11 VITALS — BP 118/68 | HR 79 | Temp 97.8°F | Wt 282.0 lb

## 2023-04-11 DIAGNOSIS — Z794 Long term (current) use of insulin: Secondary | ICD-10-CM

## 2023-04-11 DIAGNOSIS — E1165 Type 2 diabetes mellitus with hyperglycemia: Secondary | ICD-10-CM

## 2023-04-11 DIAGNOSIS — H9201 Otalgia, right ear: Secondary | ICD-10-CM | POA: Diagnosis not present

## 2023-04-11 LAB — CERVICOVAGINAL ANCILLARY ONLY
Chlamydia: NEGATIVE
Comment: NEGATIVE
Comment: NEGATIVE
Comment: NORMAL
Neisseria Gonorrhea: NEGATIVE
Trichomonas: NEGATIVE

## 2023-04-11 MED ORDER — PREDNISONE 20 MG PO TABS: 20.0000 mg | ORAL_TABLET | Freq: Every day | ORAL | 0 refills | Status: DC

## 2023-04-11 NOTE — Progress Notes (Signed)
Subjective   Patient ID: Lori Shea, female    DOB: 06/10/1975, 47 y.o.   MRN: [id number]  Chief Complaint  Patient presents with   Follow-up    Referring provider: Ivonne Andrew, NP  Lori Shea is a 47 y.o. female with Past Medical History: No date: Allergy     Comment:  Foods:  mango, honey, corn.  Allergic to "everything               tested"  15 years ago in [place] No date: Anemia     Comment:  with pregnancy No date: Anxiety No date: Arthritis     Comment:  Shoulder and hips age 58 yo: Asthma     Comment:  Triggers:  cold weather, exercise, allergens, anxiety,               URIs age 31 yo: Cholelithiasis     Comment:  Asymptomatic 12/27/2016: Closed avulsion fracture of lateral malleolus of right  fibula No date: COVID-19 2012: Diabetes mellitus type 2     Comment:  Was diagnosed 2 years earlier with PCOS 09/13/2016: Elevated liver enzymes     Comment:  none since 2018 No date: GERD (gastroesophageal reflux disease) No date: Heart murmur     Comment:  with pregnancy 20 yrs ago per pt on 10-12-2021 No date: History of kidney stones 2015: Hypertension No date: Kidney stones     Comment:  family history of renal failure 2009: Migraines No date: Morbid obesity (HCC) 2010: PCOS (polycystic ovarian syndrome) No date: Pneumonia     Comment:  several years ago No date: Wears glasses   HPI  Diabetes Mellitus: Patient presents for follow up of diabetes. Patient is followed by endocrinology. Symptoms: none. Patient denies any symptoms. Patient denies foot ulcerations, hypoglycemia , and nausea.  Evaluation to date has been included: hemoglobin A1C. Treatment to date: no recent interventions. Will continue to follow with endocrinology. A1C in October was 9.3. she is followed by endocrinology.  She is unhappy with her current diabetic medication regimen.  This is managed by endocrinology but we will place a referral for pharmacy for diabetic medication  management to confer with endocrinology.     Denies any fatigue, chest pain, shortness of breath, HA or dizziness. Denies any blurred vision, numbness or tingling.   Allergies  Allergen Reactions   Corn-Containing Products Hives    "really bad asthma attacks"   Honey Shortness Of Breath   Mango Flavoring Agent (Non-Screening) Hives and Swelling    Pt allergic to all mango - tongue and throat swelling   Glipizide     Other reaction(s): Other bad yeast infection   Liraglutide Nausea And Vomiting    victoza    Immunization History  Administered Date(s) Administered   Pneumococcal Polysaccharide-23 12/18/2018   Pneumococcal-Unspecified 04/23/1997   Td 09/06/2004   Tdap 09/13/2016    Tobacco History: Social History   Tobacco Use  Smoking Status Never  Smokeless Tobacco Never   Counseling given: Not Answered   Outpatient Encounter Medications as of 04/11/2023  Medication Sig   albuterol (PROVENTIL) (2.5 MG/3ML) 0.083% nebulizer solution USE 1 VIAL VIA NEBULIZER EVERY 6 HOURS AS NEEDED FOR WHEEZING OR SHORTNESS OF BREATH   albuterol (VENTOLIN HFA) 108 (90 Base) MCG/ACT inhaler INHALE 2 PUFFS INTO THE LUNGS EVERY 4 HOURS AS NEEDED FOR WHEEZING OR SHORTNESS OF BREATH   amitriptyline (ELAVIL) 25 MG tablet TAKE 1 TABLET BY MOUTH AT BEDTIME   cetirizine (  ZYRTEC) 10 MG tablet Take 10 mg by mouth daily.   cholestyramine (QUESTRAN) 4 g packet Take 1 packet (4 g total) by mouth 2 (two) times daily.   Continuous Glucose Sensor (FREESTYLE LIBRE 3 PLUS SENSOR) MISC Change sensor every 15 days.   cyclobenzaprine (FLEXERIL) 5 MG tablet Take 1 tablet (5 mg total) by mouth 3 (three) times daily as needed for muscle spasms.   fluticasone-salmeterol (ADVAIR DISKUS) 100-50 MCG/ACT AEPB Inhale 1 puff into the lungs 2 (two) times daily.   ibuprofen (ADVIL) 600 MG tablet Take 1 tablet (600 mg total) by mouth every 6 (six) hours as needed.   insulin aspart (NOVOLOG FLEXPEN) 100 UNIT/ML FlexPen  Inject 16-22 Units into the skin 3 (three) times daily with meals.   insulin degludec (TRESIBA FLEXTOUCH) 100 UNIT/ML FlexTouch Pen Inject 80 Units into the skin at bedtime.   Insulin Pen Needle (PEN NEEDLES) 31G X 8 MM MISC Use to inject insulin 4 times daily   meloxicam (MOBIC) 7.5 MG tablet Take 1 tablet (7.5 mg total) by mouth daily.   metFORMIN (GLUCOPHAGE) 500 MG tablet Take 1 tablet (500 mg total) by mouth 2 (two) times daily with a meal.   montelukast (SINGULAIR) 10 MG tablet TAKE 1 TABLET BY MOUTH EVERY DAY   olmesartan-hydrochlorothiazide (BENICAR HCT) 40-25 MG tablet TAKE 1 TABLET BY MOUTH EVERY DAY   pantoprazole (PROTONIX) 40 MG tablet Take 1 tablet (40 mg total) by mouth 2 (two) times daily before a meal.   predniSONE (DELTASONE) 20 MG tablet Take 1 tablet (20 mg total) by mouth daily with breakfast.   rizatriptan (MAXALT-MLT) 10 MG disintegrating tablet Take 1 tablet (10 mg total) by mouth as needed for migraine. May repeat in 2 hours if needed   sitaGLIPtin (JANUVIA) 100 MG tablet Take 1 tablet (100 mg total) by mouth daily.   metoCLOPramide (REGLAN) 5 MG tablet Take 1 tablet (5 mg total) by mouth 2 (two) times daily as needed for nausea. (Patient not taking: Reported on 04/11/2023)   promethazine (PHENERGAN) 25 MG tablet Take 1 tablet (25 mg total) by mouth every 8 (eight) hours as needed. (Patient not taking: Reported on 04/11/2023)   No facility-administered encounter medications on file as of 04/11/2023.    Review of Systems  Review of Systems  Constitutional: Negative.   HENT: Negative.    Cardiovascular: Negative.   Gastrointestinal: Negative.   Allergic/Immunologic: Negative.   Neurological: Negative.   Psychiatric/Behavioral: Negative.       Objective:   BP 118/68   Pulse 79   Temp 97.8 F (36.6 C)   Wt 282 lb (127.9 kg)   LMP 10/14/2021 (Exact Date)   SpO2 97%   BMI 46.93 kg/m   Wt Readings from Last 5 Encounters:  04/11/23 282 lb (127.9 kg)   04/09/23 277 lb 4.8 oz (125.8 kg)  12/19/22 275 lb 12.8 oz (125.1 kg)  12/10/22 270 lb 12.8 oz (122.8 kg)  11/06/22 272 lb 12.8 oz (123.7 kg)     Physical Exam Vitals and nursing note reviewed.  Constitutional:      General: She is not in acute distress.    Appearance: She is well-developed.  Cardiovascular:     Rate and Rhythm: Normal rate and regular rhythm.  Pulmonary:     Effort: Pulmonary effort is normal.     Breath sounds: Normal breath sounds.  Neurological:     Mental Status: She is alert and oriented to person, place, and time.  Assessment & Plan:   Type 2 diabetes mellitus with hyperglycemia, with long-term current use of insulin (HCC) -     AMB Referral VBCI Care Management  Right ear pain -     predniSONE; Take 1 tablet (20 mg total) by mouth daily with breakfast.  Dispense: 5 tablet; Refill: 0     Return in about 3 months (around 07/10/2023).     Ivonne Andrew, NP 04/12/2023

## 2023-04-11 NOTE — Patient Instructions (Addendum)
1. Type 2 diabetes mellitus with hyperglycemia, with long-term current use of insulin (HCC) (Primary)  - AMB Referral VBCI Care Management   Follow up:  Follow up in 3 months

## 2023-04-15 ENCOUNTER — Telehealth: Payer: Self-pay

## 2023-04-15 NOTE — Progress Notes (Unsigned)
Care Guide Pharmacy Note  04/15/2023 Name: Lori Shea MRN: [id number] DOB: Aug 11, 1975  Referred By: Ivonne Andrew, NP Reason for referral: Care Coordination (Outreach to schedule with Pharm d )   Lori Shea is a 47 y.o. year old female who is a primary care patient of Ivonne Andrew, NP.  Lori Shea was referred to the pharmacist for assistance related to: DMII  An unsuccessful telephone outreach was attempted today to contact the patient who was referred to the pharmacy team for assistance with medication management. Additional attempts will be made to contact the patient.  [name] , RMA     [facility]  [facility], [facility] Guide  Direct Dial: [phone]  Website: [email or web address]

## 2023-04-18 NOTE — Progress Notes (Signed)
Care Guide Pharmacy Note  04/18/2023 Name: Lori Shea MRN: [id number] DOB: [birth year]  Referred By: Ivonne Andrew, NP Reason for referral: Care Coordination (Outreach to schedule with Pharm d )   Lori Shea is a 47 y.o. year old female who is a primary care patient of Ivonne Andrew, NP.  Lori Shea was referred to the pharmacist for assistance related to: DMII  Successful contact was made with the patient to discuss pharmacy services including being ready for the pharmacist to call at least 5 minutes before the scheduled appointment time and to have medication bottles and any blood pressure readings ready for review. The patient agreed to meet with the pharmacist via telephone visit on (date/time).05/12/2023  [name] , RMA     [place]  [facility], [facility] Guide  Direct Dial: [phone]  Website: [email or web address]

## 2023-04-19 ENCOUNTER — Other Ambulatory Visit: Payer: Self-pay | Admitting: Nurse Practitioner

## 2023-04-19 DIAGNOSIS — J4530 Mild persistent asthma, uncomplicated: Secondary | ICD-10-CM

## 2023-04-29 ENCOUNTER — Encounter: Payer: Self-pay | Admitting: Nurse Practitioner

## 2023-04-29 MED ORDER — EMPAGLIFLOZIN 10 MG PO TABS: 10.0000 mg | ORAL_TABLET | Freq: Every day | ORAL | 3 refills | Status: DC

## 2023-04-30 ENCOUNTER — Other Ambulatory Visit: Payer: Self-pay | Admitting: Nurse Practitioner

## 2023-04-30 DIAGNOSIS — G4700 Insomnia, unspecified: Secondary | ICD-10-CM

## 2023-05-01 ENCOUNTER — Other Ambulatory Visit: Payer: Self-pay | Admitting: Nurse Practitioner

## 2023-05-10 ENCOUNTER — Telehealth: Payer: Self-pay

## 2023-05-10 ENCOUNTER — Other Ambulatory Visit (HOSPITAL_COMMUNITY): Payer: Self-pay

## 2023-05-10 NOTE — Telephone Encounter (Signed)
Pharmacy Patient Advocate Encounter   Received notification from Patient Advice Request messages that prior authorization for Novolog is required/requested.   Insurance verification completed.   The patient is insured through [facility] .   Per test claim:  Humalog is preferred by the insurance.  If suggested medication is appropriate, Please send in a new RX and discontinue this one. If not, please advise as to why it's not appropriate so that we may request a Prior Authorization. Please note, some preferred medications may still require a PA

## 2023-05-10 NOTE — Telephone Encounter (Signed)
See patient message, requesting a PA for [place], [place], and [place] possibly

## 2023-05-10 NOTE — Telephone Encounter (Signed)
Pharmacy Patient Advocate Encounter   Received notification from Patient Advice Request messages that prior authorization for Freestyle libre 3 plus is required/requested.   Insurance verification completed.   The patient is insured through [facility] .   Per test claim: PA required; PA submitted to above mentioned insurance via CoverMyMeds Key/confirmation #/EOC [facility] Status is pending

## 2023-05-10 NOTE — Telephone Encounter (Signed)
Pharmacy Patient Advocate Encounter   Received notification from Patient Advice Request messages that prior authorization for [name] is required/requested.   Insurance verification completed.   The patient is insured through [facility] .   Per test claim:  Lantus is preferred by the insurance.  If suggested medication is appropriate, Please send in a new RX and discontinue this one. If not, please advise as to why it's not appropriate so that we may request a Prior Authorization. Please note, some preferred medications may still require a PA

## 2023-05-12 ENCOUNTER — Other Ambulatory Visit: Payer: Self-pay

## 2023-05-12 NOTE — Progress Notes (Signed)
Attempted to contact patient for scheduled appointment for medication management. Left HIPAA compliant message for patient to return my call at their convenience. Will send message to facilitate rescheduling.   Jarrett Ables, PharmD PGY-1 Pharmacy Resident

## 2023-05-13 ENCOUNTER — Other Ambulatory Visit: Payer: Self-pay | Admitting: *Deleted

## 2023-05-13 DIAGNOSIS — E1165 Type 2 diabetes mellitus with hyperglycemia: Secondary | ICD-10-CM

## 2023-05-13 DIAGNOSIS — E119 Type 2 diabetes mellitus without complications: Secondary | ICD-10-CM

## 2023-05-13 DIAGNOSIS — Z7984 Long term (current) use of oral hypoglycemic drugs: Secondary | ICD-10-CM

## 2023-05-13 DIAGNOSIS — Z794 Long term (current) use of insulin: Secondary | ICD-10-CM

## 2023-05-13 MED ORDER — INSULIN LISPRO (1 UNIT DIAL) 100 UNIT/ML (KWIKPEN): 16.0000 [IU] | PEN_INJECTOR | Freq: Three times a day (TID) | SUBCUTANEOUS | 1 refills | Status: DC

## 2023-05-13 MED ORDER — INSULIN GLARGINE 100 UNIT/ML SOLOSTAR PEN: 80.0000 [IU] | PEN_INJECTOR | Freq: Every day | SUBCUTANEOUS | 0 refills | Status: DC

## 2023-05-13 NOTE — Telephone Encounter (Signed)
A prescription fro the Lantus has been sent and the patient is aware.

## 2023-05-13 NOTE — Telephone Encounter (Signed)
A prescription has been sent in for the Humalog and the patient is aware.

## 2023-05-14 NOTE — Telephone Encounter (Signed)
Pharmacy Patient Advocate Encounter  Received notification from [facility] that Prior Authorization for [facility] 3 plus has been APPROVED through 05/10/2024   PA #/Case ID/Reference #: [id number]

## 2023-05-14 NOTE — Telephone Encounter (Signed)
Patient was called and made aware. 

## 2023-05-15 ENCOUNTER — Other Ambulatory Visit: Payer: Self-pay | Admitting: Nurse Practitioner

## 2023-05-20 ENCOUNTER — Ambulatory Visit: Payer: 59 | Admitting: Nurse Practitioner

## 2023-05-27 ENCOUNTER — Other Ambulatory Visit: Payer: Self-pay | Admitting: Nurse Practitioner

## 2023-05-27 DIAGNOSIS — I10 Essential (primary) hypertension: Secondary | ICD-10-CM

## 2023-06-03 ENCOUNTER — Other Ambulatory Visit: Payer: Self-pay | Admitting: Nurse Practitioner

## 2023-06-03 DIAGNOSIS — Z794 Long term (current) use of insulin: Secondary | ICD-10-CM

## 2023-06-03 DIAGNOSIS — Z7984 Long term (current) use of oral hypoglycemic drugs: Secondary | ICD-10-CM

## 2023-06-04 ENCOUNTER — Other Ambulatory Visit: Payer: Self-pay | Admitting: Nurse Practitioner

## 2023-06-04 DIAGNOSIS — G4700 Insomnia, unspecified: Secondary | ICD-10-CM

## 2023-06-06 ENCOUNTER — Encounter (HOSPITAL_COMMUNITY): Payer: Self-pay

## 2023-06-06 ENCOUNTER — Ambulatory Visit (HOSPITAL_COMMUNITY)
Admission: RE | Admit: 2023-06-06 | Discharge: 2023-06-06 | Disposition: A | Discharge status: Home / Self Care | Payer: No Typology Code available for payment source | Source: Ambulatory Visit | Attending: Nurse Practitioner | Admitting: Nurse Practitioner

## 2023-06-06 VITALS — BP 134/83 | HR 89 | Temp 98.3°F | Resp 18

## 2023-06-06 DIAGNOSIS — B349 Viral infection, unspecified: Secondary | ICD-10-CM | POA: Diagnosis not present

## 2023-06-06 NOTE — ED Triage Notes (Signed)
Pt c/o cough, fever, headache, and sore throat x4 days. States feeling better and needs a work note to return.

## 2023-06-06 NOTE — Discharge Instructions (Addendum)
Continue symptomatic care as need

## 2023-06-06 NOTE — ED Provider Notes (Signed)
[facility]    CSN: [id number] Arrival date & time: 06/06/23  1415     History   Chief Complaint Chief Complaint  Patient presents with   Headache    Cold symptoms for 4 days,need note to go back to work. - Entered by patient    HPI Lori Shea is a 48 y.o. female.  Patient had 4 day history of fever, headache, congestion, and dry throat. No cough. Tmax 101 on first day She is feeling better today, no fever in 24 hours Taking dayquil/nyquil Needs note to return to work  Past Medical History:  Diagnosis Date   Allergy    Foods:  mango, honey, corn.  Allergic to "everything tested"  15 years ago in [place]   Anemia    with pregnancy   Anxiety    Arthritis    Shoulder and hips   Asthma age 73 yo   Triggers:  cold weather, exercise, allergens, anxiety, URIs   Cholelithiasis age 36 yo   Asymptomatic   Closed avulsion fracture of lateral malleolus of right fibula 12/27/2016   COVID-19    Diabetes mellitus type 2 2012   Was diagnosed 2 years earlier with PCOS   Elevated liver enzymes 09/13/2016   none since 2018   GERD (gastroesophageal reflux disease)    Heart murmur    with pregnancy 20 yrs ago per pt on 10-12-2021   History of kidney stones    Hypertension 2015   Kidney stones    family history of renal failure   Migraines 2009   Morbid obesity (HCC)    PCOS (polycystic ovarian syndrome) 2010   Pneumonia    several years ago   Wears glasses     Patient Active Problem List   Diagnosis Date Noted   Left upper arm pain 12/10/2022   Soft tissue infection 12/10/2022   UTI symptoms 12/10/2022   Chronic diarrhea 01/25/2022   History of colonic polyps 01/25/2022   Gastroesophageal reflux disease 01/25/2022   Chronic nausea 01/25/2022   Gastritis without bleeding 01/25/2022   Stress incontinence of urine 01/02/2021   Skin irritation 01/02/2021   Hepatic steatosis 12/01/2019   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/2019   Hemoglobin A1C  greater than 9%, indicating poor diabetic control 03/23/2019   Hyperglycemia 03/23/2019   Class 3 severe obesity due to excess calories with serious comorbidity and body mass index (BMI) of 40.0 to 44.9 in adult (HCC) 03/23/2019   Yeast infection 03/23/2019   BMI 40.0-44.9, adult (HCC) 03/09/2019   Cervical dysplasia 07/31/2017   Alopecia of scalp 05/09/2017   Pain in left toe(s) 12/27/2016   Morbid obesity (HCC) 09/13/2016   Elevated liver enzymes 09/13/2016   Allergy    Asthma    Hypertension 04/23/2013   Irregular periods 02/22/2011   Type 2 diabetes mellitus with hyperglycemia, with long-term current use of insulin (HCC) 04/23/2010   PCOS (polycystic ovarian syndrome) 04/23/2008   Migraines 04/24/2007    Past Surgical History:  Procedure Laterality Date   CHOLECYSTECTOMY N/A 12/01/2019   Procedure: LAPAROSCOPIC CHOLECYSTECTOMY;  Surgeon: Darnell Level, MD;  Location: [facility];  Service: General;  Laterality: N/A;   colonscopy and endoscopy  05/25/2021   COLPOSCOPY N/A 2021   CYST EXCISION     DILITATION & CURRETTAGE/HYSTROSCOPY WITH NOVASURE ABLATION N/A 10/18/2021   Procedure: DILATATION & CURETTAGE/HYSTEROSCOPY WITH NOVASURE ABLATION;  Surgeon: Ever[place] Bing, MD;  Location: [facility];  Service: Gynecology;  Laterality: N/A;   LAPAROSCOPIC  TUBAL LIGATION Bilateral 01/28/2019   Procedure: LAPAROSCOPIC TUBAL LIGATION AND PAP SMEAR;  Surgeon: [place] Bing, MD;  Location: [facility];  Service: Gynecology;  Laterality: Bilateral;   TONSILLECTOMY AND ADENOIDECTOMY Bilateral 12/23/2018   Procedure: TONSILLECTOMY AND ADENOIDECTOMY;  Surgeon: Newman Pies, MD;  Location: [facility];  Service: ENT;  Laterality: Bilateral;    OB History     Gravida  4   Para  1   Term  1   Preterm      AB  3   Living  1      SAB  3   IAB      Ectopic      Multiple      Live Births               Home Medications    Prior to  Admission medications   Medication Sig Start Date End Date Taking? Authorizing Provider  albuterol (PROVENTIL) (2.5 MG/3ML) 0.083% nebulizer solution USE 1 VIAL VIA NEBULIZER EVERY 6 HOURS AS NEEDED FOR WHEEZING OR SHORTNESS OF BREATH 01/23/23   Ivonne Andrew, NP  albuterol (VENTOLIN HFA) 108 (90 Base) MCG/ACT inhaler INHALE 2 PUFFS INTO THE LUNGS EVERY 4 HOURS AS NEEDED FOR WHEEZING OR SHORTNESS OF BREATH 01/23/23   Ivonne Andrew, NP  amitriptyline (ELAVIL) 25 MG tablet TAKE 1 TABLET BY MOUTH AT BEDTIME 06/05/23   Ivonne Andrew, NP  cetirizine (ZYRTEC) 10 MG tablet Take 10 mg by mouth daily.    [provider]  cholestyramine (QUESTRAN) 4 g packet Take 1 packet (4 g total) by mouth 2 (two) times daily. 10/02/22   Mansouraty, Netty Starring., MD  Continuous Glucose Sensor (FREESTYLE LIBRE 3 PLUS SENSOR) MISC Change sensor every 15 days. 02/14/23   Dani Gobble, NP  cyclobenzaprine (FLEXERIL) 5 MG tablet Take 1 tablet (5 mg total) by mouth 3 (three) times daily as needed for muscle spasms. 12/28/20   Barbette Merino, NP  empagliflozin (JARDIANCE) 10 MG TABS tablet Take 1 tablet (10 mg total) by mouth daily. 04/29/23   Dani Gobble, NP  fluticasone-salmeterol (ADVAIR DISKUS) 100-50 MCG/ACT AEPB Inhale 1 puff into the lungs 2 (two) times daily. 10/04/22   Ivonne Andrew, NP  ibuprofen (ADVIL) 600 MG tablet Take 1 tablet (600 mg total) by mouth every 6 (six) hours as needed. 10/18/21   [place] Bing, MD  insulin glargine (LANTUS) 100 UNIT/ML Solostar Pen Inject 80 Units into the skin at bedtime. 05/13/23   Dani Gobble, NP  insulin lispro (HUMALOG) 100 UNIT/ML KwikPen INJECT 16-22 UNITS into THE SKIN 3 TIMES DAILY BEFORE MEALS 06/04/23   Dani Gobble, NP  Insulin Pen Needle (PEN NEEDLES) 31G X 8 MM MISC Use to inject insulin 4 times daily 02/06/23   Dani Gobble, NP  JANUVIA 100 MG tablet TAKE 1 TABLET BY MOUTH EVERY DAY 05/15/23   Dani Gobble, NP  meloxicam  (MOBIC) 7.5 MG tablet Take 1 tablet (7.5 mg total) by mouth daily. 10/23/22   McCaughan, Dia D, DPM  metFORMIN (GLUCOPHAGE) 500 MG tablet Take 1 tablet (500 mg total) by mouth 2 (two) times daily with a meal. 02/06/23   Reardon, Freddi Starr, NP  metoCLOPramide (REGLAN) 5 MG tablet Take 1 tablet (5 mg total) by mouth 2 (two) times daily as needed for nausea. Patient not taking: Reported on 04/11/2023 12/07/22   Mansouraty, Netty Starring., MD  montelukast (SINGULAIR) 10 MG tablet TAKE  1 TABLET BY MOUTH EVERY DAY 04/19/23   Ivonne Andrew, NP  olmesartan-hydrochlorothiazide (BENICAR HCT) 40-25 MG tablet TAKE 1 TABLET BY MOUTH EVERY DAY 05/27/23   Ivonne Andrew, NP  pantoprazole (PROTONIX) 40 MG tablet Take 1 tablet (40 mg total) by mouth 2 (two) times daily before a meal. 01/08/23   Mansouraty, Netty Starring., MD  rizatriptan (MAXALT-MLT) 10 MG disintegrating tablet Take 1 tablet (10 mg total) by mouth as needed for migraine. May repeat in 2 hours if needed 11/23/22   Ivonne Andrew, NP    Family History Family History  Problem Relation Age of Onset   Healthy Mother    Diabetes Father    Hyperlipidemia Father    Hypertension Father    Heart disease Father    Kidney disease Father        Developed after 2nd CABG   Colon polyps Father    Diabetes Paternal Grandfather    Heart disease Paternal Grandfather    Hyperlipidemia Paternal Grandfather    Hypertension Paternal Grandfather    Kidney disease Paternal Grandfather     Social History Social History   Tobacco Use   Smoking status: Never   Smokeless tobacco: Never  Vaping Use   Vaping status: Never Used  Substance Use Topics   Alcohol use: No   Drug use: No     Allergies   Corn-containing products, Honey, Mango flavoring agent (non-screening), Glipizide, and Liraglutide   Review of Systems Review of Systems Per HPI  Physical Exam Triage Vital Signs ED Triage Vitals  Encounter Vitals Group     BP 06/06/23 1447 134/83      Systolic BP Percentile --      Diastolic BP Percentile --      Pulse Rate 06/06/23 1447 89     Resp 06/06/23 1447 18     Temp 06/06/23 1447 98.3 F (36.8 C)     Temp Source 06/06/23 1447 Oral     SpO2 06/06/23 1447 95 %     Weight --      Height --      Head Circumference --      Peak Flow --      Pain Score 06/06/23 1448 6     Pain Loc --      Pain Education --      Exclude from Growth Chart --    No data found.  Updated Vital Signs BP 134/83 (BP Location: Left Arm)   Pulse 89   Temp 98.3 F (36.8 C) (Oral)   Resp 18   LMP 10/14/2021 (Exact Date)   SpO2 95%   Physical Exam Vitals and nursing note reviewed.  Constitutional:      Appearance: She is not ill-appearing.  HENT:     Right Ear: Tympanic membrane and ear canal normal.     Left Ear: Tympanic membrane and ear canal normal.     Nose: Congestion present. No rhinorrhea.     Mouth/Throat:     Mouth: Mucous membranes are moist.     Pharynx: Oropharynx is clear. No posterior oropharyngeal erythema.     Comments: No tonsils  Eyes:     Conjunctiva/sclera: Conjunctivae normal.  Cardiovascular:     Rate and Rhythm: Normal rate and regular rhythm.     Pulses: Normal pulses.     Heart sounds: Normal heart sounds.  Pulmonary:     Effort: Pulmonary effort is normal.     Breath sounds: Normal breath sounds.  Musculoskeletal:     Cervical back: Normal range of motion.  Lymphadenopathy:     Cervical: No cervical adenopathy.  Skin:    General: Skin is warm and dry.  Neurological:     Mental Status: She is alert and oriented to person, place, and time.     UC Treatments / Results  Labs (all labs ordered are listed, but only abnormal results are displayed) Labs Reviewed - No data to display  EKG  Radiology No results found.  Procedures Procedures (including critical care time)  Medications Ordered in UC Medications - No data to display  Initial Impression / Assessment and Plan / UC Course  I have  reviewed the triage vital signs and the nursing notes.  Pertinent labs & imaging results that were available during my care of the patient were reviewed by me and considered in my medical decision making (see chart for details).  Viral illness Continue symptomatic care Over 24 hours without fever Note for work is provided Patient without questions at this time  Final Clinical Impressions(s) / UC Diagnoses   Final diagnoses:  Viral illness     Discharge Instructions      Continue symptomatic care as need     ED Prescriptions   None    PDMP not reviewed this encounter.   Kimble Hitchens, Lurena Joiner, [place] 06/06/23 1517

## 2023-06-11 ENCOUNTER — Telehealth: Payer: Self-pay

## 2023-06-11 DIAGNOSIS — Z79899 Other long term (current) drug therapy: Secondary | ICD-10-CM

## 2023-06-11 NOTE — Progress Notes (Signed)
   06/11/2023  Patient ID: Lori Shea, female   DOB: 1976/03/08, 48 y.o.   MRN: [id number]  Attempted to contact patient for medication management/review. Left HIPAA compliant message for patient to return my call at their convenience.   Second attempt for patient outreach (including previous telephone from other pharmacist). Will follow up with patient in 7-10 business days.                                                    Thank you for allowing pharmacy to be a part of this patient's care.  Cephus Shelling, PharmD Clinical Pharmacist Cell: [phone]

## 2023-06-20 ENCOUNTER — Other Ambulatory Visit: Payer: Self-pay | Admitting: Nurse Practitioner

## 2023-06-20 DIAGNOSIS — J4530 Mild persistent asthma, uncomplicated: Secondary | ICD-10-CM

## 2023-06-28 ENCOUNTER — Other Ambulatory Visit: Payer: Self-pay

## 2023-06-28 DIAGNOSIS — Z79899 Other long term (current) drug therapy: Secondary | ICD-10-CM

## 2023-06-28 NOTE — Progress Notes (Signed)
   06/28/2023 Name: Lori Shea MRN: [id number] DOB: [birth year]  Chief Complaint  Patient presents with   Diabetes    Lori Shea is a 48 y.o. year old female who presented for a telephone visit.   They were referred to the pharmacist by their PCP for assistance in managing diabetes. Of note, patient is actively being followed by endocrinology for the management of  diabetes; she has not rescheduled her appointment with Ronny Bacon, NP.    Subjective:  Care Team: Primary Care Provider: Ivonne Andrew, NP ; Next Scheduled Visit: 07/26/2023   Medication Access/Adherence  Current Pharmacy:  [facility] - [place], [place] - [address] [place] [address] Phone: [phone] Fax: [phone]  [id number] - [place], [place] - [address] [place] [address] Phone: [phone] Fax: [phone]  [id number] [place] Otto, [place] - [address] AT [facility] [address] [facility] [address] [place] [address] Phone: [phone] Fax: [phone]   Diabetes:  She did not have any complaints of affording medications as they are $0 copay with her insurance.   Current medications (per chart review): Patient reported that she modified Humalog from 16-22 units three times daily to 5 units TID. She felt that the higher dose of Humalog was not working and patient reduced the dose. She feels the lower dose has help better control her blood glucose and believes her A1c will be lowered. Encouraged patient to reschedule her appointment with the endocrinologist.     Current glucose readings: 230s-250s (right after a meal) but notices her BG goes back down "to where it should be" Using CGM meter; testing, several times daily  I was not able to go into details of diabetes management as patient was at work. She works typical business hours during the week and asked  that the encounters be abbreviated. She does not have her FreeStyle Libre connected to [facility]; an invite was sent to the patient.     Objective:  Lab Results  Component Value Date   HGBA1C 9.3 (A) 02/06/2023    Lab Results  Component Value Date   CREATININE 0.72 12/13/2022   BUN 18 12/13/2022   NA 140 12/13/2022   K 3.6 12/13/2022   CL 106 12/13/2022   CO2 23 12/13/2022    Lab Results  Component Value Date   CHOL 181 11/01/2022   HDL 37 (L) 11/01/2022   LDLCALC 108 (H) 11/01/2022   TRIG 205 (H) 11/01/2022   CHOLHDL 4.9 (H) 11/01/2022    Medications Reviewed Today   Medications were not reviewed in this encounter       Assessment/Plan:   Will follow up in 1 week, for an abbreviated encounter and will not update medication list due to time restriction. Encouraged to see endocrinologist. Will reach out to endocrinologist regarding patient's reported self dose adjustment.    Thank you for allowing pharmacy to be a part of this patient's care. Cephus Shelling, PharmD Clinical Pharmacist Cell: [phone]

## 2023-07-02 LAB — HM DIABETES EYE EXAM

## 2023-07-04 ENCOUNTER — Other Ambulatory Visit: Payer: Self-pay | Admitting: Nurse Practitioner

## 2023-07-04 DIAGNOSIS — G4700 Insomnia, unspecified: Secondary | ICD-10-CM

## 2023-07-05 ENCOUNTER — Other Ambulatory Visit: Payer: Self-pay

## 2023-07-05 DIAGNOSIS — Z794 Long term (current) use of insulin: Secondary | ICD-10-CM

## 2023-07-05 NOTE — Progress Notes (Signed)
 07/05/2023 Name: Lori Shea MRN: [id number] DOB: 10/15/1975  Chief Complaint  Patient presents with   Diabetes    Lori Shea is a 48 y.o. year old female who presented for a telephone visit.   They were referred to the pharmacist by their PCP for assistance in managing diabetes.    Subjective:  Care Team: Primary Care Provider: Ivonne Andrew, NP ; Next Scheduled Visit: 07/26/2023   Medication Access/Adherence  Current Pharmacy:  [facility] - [place], [place] - [address] [place] [address] Phone: [phone] Fax: [phone]  [id number] - [place], [place] - [address] [place] [address] Phone: [phone] Fax: [phone]  [facility] #[id number] [place] Otto, [place] - [address] AT [facility] [address] [facility] [address] [place] [address] Phone: [phone] Fax: [phone]   Patient reports affordability concerns with their medications: No  Patient reports access/transportation concerns to their pharmacy: No  Patient reports adherence concerns with their medications:  No  Per Dr. Tiajuana Amass, olmesartan/hydrochlorothiazide, the adherence rate is the lowest. Patient reports that she is compliant with medication.    Diabetes:  Current medications: Januvia 100 mg daiy, Humalog 5 units TID  as the self adjusted dose(prescribed 16-22 units TID), Lantus 80 units at bedtime, Jardiance 10 mg daily Medications tried in the past: Rybelsus (gastroparesis)   Current glucose readings:  Fasting: 120 -148 2-hr PPBG: 220-325 (depends on what she eats)  -- Re-sent invitation to patient for LibreView access Using CGM meter; testing several times daily  - Denies elevated BG while on reduced dose of prandial insulin and believes her BG has improved. Endocrinologist was made aware by this pharmacist, after last encounter, regarding how patient takes  medication.     Patient denies hypoglycemic s/sx including dizziness, shakiness, sweating. Patient reports hyperglycemic symptoms including polyuria, polydipsia, nocturia, blurred vision. She reports drinking a lot of fluids and is unsure whether her frequent urination is related to liquid consumption or elevated blood glucose. She checks her blood glucose on days when she drinks more fluids and confirms that the frequent urination is unrelated to hyperglycemia, as her blood glucose levels are not significantly elevated.  Current meal patterns:  - Breakfast: typically skips; started protein shake recently which lasts until lunch - Lunch: is whatever is served at her job at the [employer] (lots of veggies, baked fished/chicken, shepard's pie, etc) - Supper: baked salmon, veggie, meat, starch (half sweet potatoe) - Snacks: skips mostly; at night she does eat something sweet but recently switched for fruit (handful of grapes,etc.) - Drinks: 1 cup coffee (two splendas; sugar free creamer), water, 1 daily diet soda, crystal lite tea  Current physical activity: 2.5 miles daily (recently with warm weather); she has gym membership and is open to exercising on inclement weather  -- she shared that she enjoys walking for the health benefits   The 10-year ASCVD risk score (Arnett DK, et al., 2019) is: 4.7%   Values used to calculate the score:     Age: 14 years     Sex: Female     Is Non-Hispanic African American: No     Diabetic: Yes     Tobacco smoker: No     Systolic Blood Pressure: 134 mmHg     Is BP treated: Yes     HDL Cholesterol: 37 mg/dL  Total Cholesterol: 181 mg/dL    Objective:  Lab Results  Component Value Date   HGBA1C 9.3 (A) 02/06/2023    Lab Results  Component Value Date   CREATININE 0.72 12/13/2022   BUN 18 12/13/2022   NA 140 12/13/2022   K 3.6 12/13/2022   CL 106 12/13/2022   CO2 23 12/13/2022    Lab Results  Component Value Date   CHOL 181  11/01/2022   HDL 37 (L) 11/01/2022   LDLCALC 108 (H) 11/01/2022   TRIG 205 (H) 11/01/2022   CHOLHDL 4.9 (H) 11/01/2022    Medications Reviewed Today     Reviewed by Katha Cabal, RPH (Pharmacist) on 07/05/23 at 1200  Med List Status: <None>   Medication Order Taking? Sig Documenting Provider Last Dose Status Informant  albuterol (PROVENTIL) (2.5 MG/3ML) 0.083% nebulizer solution [id number] Yes USE 1 VIAL VIA NEBULIZER EVERY 6 HOURS AS NEEDED FOR WHEEZING OR SHORTNESS OF Royann Shivers, NP Taking Active   albuterol (VENTOLIN HFA) 108 (90 Base) MCG/ACT inhaler [id number] Yes INHALE 2 PUFFS INTO THE LUNGS EVERY 4 HOURS AS NEEDED FOR WHEEZING OR SHORTNESS OF Royann Shivers, NP Taking Active   amitriptyline (ELAVIL) 25 MG tablet [id number] Yes TAKE 1 TABLET BY MOUTH AT BEDTIME Ivonne Andrew, NP Taking Active   Bacillus Coagulans-Inulin (PROBIOTIC-PREBIOTIC PO) [id number] Yes Take 1 capsule by mouth daily. [provider] Taking Active   cetirizine (ZYRTEC) 10 MG tablet [id number] Yes Take 10 mg by mouth daily. [provider] Taking Active   cholecalciferol (VITAMIN D3) 25 MCG (1000 UNIT) tablet [id number] Yes Take 1,000 Units by mouth daily. [provider] Taking Active   cholestyramine (QUESTRAN) 4 g packet [id number] Yes Take 1 packet (4 g total) by mouth 2 (two) times daily. Mansouraty, Netty Starring., MD Taking Active            Med Note Para March, [facility] R   Fri Jul 05, 2023 11:49 AM) Taking differently: 1 packet PRN   Continuous Glucose Sensor (FREESTYLE LIBRE 3 SENSOR) [place] [id number] Yes Use to check glucose continuously and change sensor every 14 days. Dani Gobble, NP Taking Active   ELDERBERRY PO [id number] Yes Take 1 tablet by mouth daily. She does not  know the strength [provider] Taking Active   empagliflozin (JARDIANCE) 10 MG TABS tablet [id number] Yes Take 1 tablet (10 mg total) by mouth daily. Dani Gobble, NP Taking  Active   fluticasone-salmeterol (ADVAIR DISKUS) 100-50 MCG/ACT AEPB [id number] Yes Inhale 1 puff into the lungs 2 (two) times daily. Ivonne Andrew, NP Taking Active   ibuprofen (ADVIL) 600 MG tablet [id number] Yes Take 1 tablet (600 mg total) by mouth every 6 (six) hours as needed. [place] Bing, MD Taking Active   insulin glargine (LANTUS) 100 UNIT/ML Solostar Pen [id number] Yes Inject 80 Units into the skin at bedtime. Dani Gobble, NP Taking Active   insulin lispro (HUMALOG) 100 UNIT/ML KwikPen [id number] Yes INJECT 16-22 UNITS into THE SKIN 3 TIMES DAILY BEFORE MEALS Dani Gobble, NP Taking Active            Med Note Para March, [facility] R   Fri Jul 05, 2023 11:52 AM) Taking differently: 5 units TID  Insulin Pen Needle (PEN NEEDLES) 31G X 8 MM MISC [id number] Yes Use to inject insulin 4 times daily Dani Gobble, NP Taking Active   JANUVIA 100 MG tablet [id number] Yes TAKE 1 TABLET BY MOUTH EVERY DAY Lurlean Leyden,  Freddi Starr, NP Taking Active   lactobacillus acidophilus (BACID) TABS tablet [id number] Yes Take 1 tablet by mouth daily. [provider] Taking Active   meloxicam (MOBIC) 7.5 MG tablet [id number] Yes Take 1 tablet (7.5 mg total) by mouth daily. McCaughan, Dia D, DPM Taking Active   metFORMIN (GLUCOPHAGE) 500 MG tablet [id number] Yes Take 1 tablet (500 mg total) by mouth 2 (two) times daily with a meal. Dani Gobble, NP Taking Active   metoCLOPramide (REGLAN) 5 MG tablet [id number] No Take 1 tablet (5 mg total) by mouth 2 (two) times daily as needed for nausea.  Patient not taking: Reported on 07/05/2023   Mansouraty, Netty Starring., MD Not Taking Active   montelukast (SINGULAIR) 10 MG tablet [id number] Yes Take 1 tablet by mouth daily. [provider] Taking Active   Multiple Vitamin (MULTIVITAMIN WITH MINERALS) TABS tablet [id number] Yes Take 1 tablet by mouth daily. [provider] Taking Active   olmesartan-hydrochlorothiazide [facility] HCT) 40-25 MG  tablet [id number] Yes TAKE 1 TABLET BY MOUTH EVERY DAY Ivonne Andrew, NP Taking Active   pantoprazole (PROTONIX) 40 MG tablet [id number] Yes Take 1 tablet (40 mg total) by mouth 2 (two) times daily before a meal. Mansouraty, Netty Starring., MD Taking Active   rizatriptan (MAXALT-MLT) 10 MG disintegrating tablet [id number] Yes Take 1 tablet (10 mg total) by mouth as needed for migraine. May repeat in 2 hours if needed Ivonne Andrew, NP Taking Active   TURMERIC PO [id number] Yes Take 1 capsule by mouth daily. She does not know the strength [provider] Taking Active   Med List Note Barbette Merino, [place] 12/23/19 1206):                Assessment/Plan:   Diabetes: - Currently uncontrolled. Patient self adjusted prandial insulin as she felt she was becoming resistant and the more insulin she injected the worse her BG was. She was also concerned about weight gain from insulin.  - Reviewed long term cardiovascular and renal outcomes of uncontrolled blood sugar - Reviewed goal A1c, goal fasting, and goal 2 hour post prandial glucose - Reviewed dietary modifications including continue to work on improving diet as she has been actively trying to improve diabetes through diet and exercise - Reviewed lifestyle modifications including: encouraged to continue regular daily walks and other exercise - Recommend to contact PCP/endocrinologist before self adjusting medication changes   - Recommend to check glucose as many times as you have been - Given patient's reservations with adding more prandial insulin, may consider increasing Jardiance to 25 mg daily at the next appointment when more accurate CGM data is available. May also consider assessment for statin therapy at future appointments.  Medication Management:  - Patient is taking pancreatic medication from [place] (she did not specify whether it was [employer]). Patient states her gastroenterologist was made aware of how medication is  supplies. Due to concern of quality, completed a coupon savings card for patient to get medication at her locally preferred pharmacy. However, when we called the pharmacy to add the manufacturers coupon savings card it was noted Pancrealipase required a PA and patient was no longer interested in going through the PA process. As such, she declined using the coupon savings card.    Follow Up Plan: ***  - AVs BG goals

## 2023-07-05 NOTE — Patient Instructions (Addendum)
 Lori Shea,   Check your blood sugars twice daily:  1) Fasting, first thing in the morning before breakfast and  2) 2 hours after your largest meal alternating checking after lunch and dinner.   For a goal A1c of less than 7%, goal fasting readings are less than 130 and goal 2 hour after meal readings are less than 180.   Thank you for allowing pharmacy to be a part of this patient's care.  Cephus Shelling, PharmD Clinical Pharmacist Cell: [phone]

## 2023-07-07 ENCOUNTER — Telehealth: Admitting: Family Medicine

## 2023-07-07 DIAGNOSIS — R399 Unspecified symptoms and signs involving the genitourinary system: Secondary | ICD-10-CM

## 2023-07-07 MED ORDER — SULFAMETHOXAZOLE-TRIMETHOPRIM 800-160 MG PO TABS: 1.0000 | ORAL_TABLET | Freq: Two times a day (BID) | ORAL | 0 refills | Status: AC

## 2023-07-07 NOTE — Progress Notes (Signed)
 Virtual Visit Consent   Lori Shea, you are scheduled for a virtual visit with a [facility] provider today. Just as with appointments in the office, your consent must be obtained to participate. Your consent will be active for this visit and any virtual visit you may have with one of our providers in the next 365 days. If you have a MyChart account, a copy of this consent can be sent to you electronically.  As this is a virtual visit, video technology does not allow for your provider to perform a traditional examination. This may limit your provider's ability to fully assess your condition. If your provider identifies any concerns that need to be evaluated in person or the need to arrange testing (such as labs, EKG, etc.), we will make arrangements to do so. Although advances in technology are sophisticated, we cannot ensure that it will always work on either your end or our end. If the connection with a video visit is poor, the visit may have to be switched to a telephone visit. With either a video or telephone visit, we are not always able to ensure that we have a secure connection.  By engaging in this virtual visit, you consent to the provision of healthcare and authorize for your insurance to be billed (if applicable) for the services provided during this visit. Depending on your insurance coverage, you may receive a charge related to this service.  I need to obtain your verbal consent now. Are you willing to proceed with your visit today? Lori Shea has provided verbal consent on 07/07/2023 for a virtual visit (video or telephone). Lori Curio, FNP  Date: 07/07/2023 10:33 AM   Virtual Visit via Video Note   I, Lori Shea, connected with  Lori Shea  ([id number], [birth year]) on 07/07/23 at 10:30 AM EDT by a video-enabled telemedicine application and verified that I am speaking with the correct person using two identifiers.  Location: Patient: Virtual Visit Location  Patient: Home Provider: Virtual Visit Location Provider: Home Office   I discussed the limitations of evaluation and management by telemedicine and the availability of in person appointments. The patient expressed understanding and agreed to proceed.    History of Present Illness: Lori Shea is a 48 y.o. who identifies as a female who was assigned female at birth, and is being seen today for urinary pelvic pressure burning and urgency. No fever. History of kidney stones. Pt reports she can take bactrim.  HPI: HPI  Problems:  Patient Active Problem List   Diagnosis Date Noted   Left upper arm pain 12/10/2022   Soft tissue infection 12/10/2022   UTI symptoms 12/10/2022   Chronic diarrhea 01/25/2022   History of colonic polyps 01/25/2022   Gastroesophageal reflux disease 01/25/2022   Chronic nausea 01/25/2022   Gastritis without bleeding 01/25/2022   Stress incontinence of urine 01/02/2021   Skin irritation 01/02/2021   Hepatic steatosis 12/01/2019   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/2019   Hemoglobin A1C greater than 9%, indicating poor diabetic control 03/23/2019   Hyperglycemia 03/23/2019   Class 3 severe obesity due to excess calories with serious comorbidity and body mass index (BMI) of 40.0 to 44.9 in adult (HCC) 03/23/2019   Yeast infection 03/23/2019   BMI 40.0-44.9, adult (HCC) 03/09/2019   Cervical dysplasia 07/31/2017   Alopecia of scalp 05/09/2017   Pain in left toe(s) 12/27/2016   Morbid obesity (HCC) 09/13/2016   Elevated liver enzymes 09/13/2016   Allergy  Asthma    Hypertension 04/23/2013   Irregular periods 02/22/2011   Type 2 diabetes mellitus with hyperglycemia, with long-term current use of insulin (HCC) 04/23/2010   PCOS (polycystic ovarian syndrome) 04/23/2008   Migraines 04/24/2007    Allergies:  Allergies  Allergen Reactions   Corn-Containing Products Hives    "really bad asthma attacks"   Honey Shortness Of Breath   Mango  Flavoring Agent (Non-Screening) Hives and Swelling    Pt allergic to all mango - tongue and throat swelling   Glipizide     Other reaction(s): Other bad yeast infection; she feels she can take with probiotics   Liraglutide Nausea And Vomiting    victoza   Semaglutide Nausea Only    gastroparesis   Medications:  Current Outpatient Medications:    albuterol (PROVENTIL) (2.5 MG/3ML) 0.083% nebulizer solution, USE 1 VIAL VIA NEBULIZER EVERY 6 HOURS AS NEEDED FOR WHEEZING OR SHORTNESS OF BREATH, Disp: 75 mL, Rfl: 6   albuterol (VENTOLIN HFA) 108 (90 Base) MCG/ACT inhaler, INHALE 2 PUFFS INTO THE LUNGS EVERY 4 HOURS AS NEEDED FOR WHEEZING OR SHORTNESS OF BREATH, Disp: 6.7 g, Rfl: 2   amitriptyline (ELAVIL) 25 MG tablet, TAKE 1 TABLET BY MOUTH AT BEDTIME, Disp: 30 tablet, Rfl: 0   Bacillus Coagulans-Inulin (PROBIOTIC-PREBIOTIC PO), Take 1 capsule by mouth daily., Disp: , Rfl:    cetirizine (ZYRTEC) 10 MG tablet, Take 10 mg by mouth daily., Disp: , Rfl:    cholecalciferol (VITAMIN D3) 25 MCG (1000 UNIT) tablet, Take 1,000 Units by mouth daily., Disp: , Rfl:    cholestyramine (QUESTRAN) 4 g packet, Take 1 packet (4 g total) by mouth 2 (two) times daily., Disp: 60 packet, Rfl: 0   Continuous Glucose Sensor (FREESTYLE LIBRE 3 SENSOR) MISC, Use to check glucose continuously and change sensor every 14 days., Disp: 6 each, Rfl: 3   ELDERBERRY PO, Take 1 tablet by mouth daily. She does not  know the strength, Disp: , Rfl:    empagliflozin (JARDIANCE) 10 MG TABS tablet, Take 1 tablet (10 mg total) by mouth daily., Disp: 30 tablet, Rfl: 3   fluticasone-salmeterol (ADVAIR DISKUS) 100-50 MCG/ACT AEPB, Inhale 1 puff into the lungs 2 (two) times daily., Disp: 60 each, Rfl: 0   ibuprofen (ADVIL) 600 MG tablet, Take 1 tablet (600 mg total) by mouth every 6 (six) hours as needed., Disp: 30 tablet, Rfl: 0   insulin glargine (LANTUS) 100 UNIT/ML Solostar Pen, Inject 80 Units into the skin at bedtime., Disp: 75 mL, Rfl:  0   insulin lispro (HUMALOG) 100 UNIT/ML KwikPen, INJECT 16-22 UNITS into THE SKIN 3 TIMES DAILY BEFORE MEALS, Disp: 30 mL, Rfl: 1   Insulin Pen Needle (PEN NEEDLES) 31G X 8 MM MISC, Use to inject insulin 4 times daily, Disp: 200 each, Rfl: 3   JANUVIA 100 MG tablet, TAKE 1 TABLET BY MOUTH EVERY DAY, Disp: 90 tablet, Rfl: 1   lactobacillus acidophilus (BACID) TABS tablet, Take 1 tablet by mouth daily., Disp: , Rfl:    meloxicam (MOBIC) 7.5 MG tablet, Take 1 tablet (7.5 mg total) by mouth daily., Disp: 30 tablet, Rfl: 0   metFORMIN (GLUCOPHAGE) 500 MG tablet, Take 1 tablet (500 mg total) by mouth 2 (two) times daily with a meal., Disp: 180 tablet, Rfl: 3   metoCLOPramide (REGLAN) 5 MG tablet, Take 1 tablet (5 mg total) by mouth 2 (two) times daily as needed for nausea. (Patient not taking: Reported on 07/05/2023), Disp: 60 tablet, Rfl: 0  montelukast (SINGULAIR) 10 MG tablet, Take 1 tablet by mouth daily., Disp: , Rfl:    Multiple Vitamin (MULTIVITAMIN WITH MINERALS) TABS tablet, Take 1 tablet by mouth daily., Disp: , Rfl:    olmesartan-hydrochlorothiazide (BENICAR HCT) 40-25 MG tablet, TAKE 1 TABLET BY MOUTH EVERY DAY, Disp: 90 tablet, Rfl: 0   pantoprazole (PROTONIX) 40 MG tablet, Take 1 tablet (40 mg total) by mouth 2 (two) times daily before a meal., Disp: 60 tablet, Rfl: 3   rizatriptan (MAXALT-MLT) 10 MG disintegrating tablet, Take 1 tablet (10 mg total) by mouth as needed for migraine. May repeat in 2 hours if needed, Disp: 12 tablet, Rfl: 0   TURMERIC PO, Take 1 capsule by mouth daily. She does not know the strength, Disp: , Rfl:   Observations/Objective: Patient is well-developed, well-nourished in no acute distress.  Resting comfortably  at home.  Head is normocephalic, atraumatic.  No labored breathing.  Speech is clear and coherent with logical content.  Patient is alert and oriented at baseline.    Assessment and Plan: 1. UTI symptoms (Primary)  Increase fluids, preventative  measures discussed, UC if sx worsen.   Follow Up Instructions: I discussed the assessment and treatment plan with the patient. The patient was provided an opportunity to ask questions and all were answered. The patient agreed with the plan and demonstrated an understanding of the instructions.  A copy of instructions were sent to the patient via MyChart unless otherwise noted below.     The patient was advised to call back or seek an in-person evaluation if the symptoms worsen or if the condition fails to improve as anticipated.    Lori Curio, FNP

## 2023-07-07 NOTE — Patient Instructions (Signed)

## 2023-07-09 ENCOUNTER — Other Ambulatory Visit: Payer: Self-pay | Admitting: Gastroenterology

## 2023-07-10 ENCOUNTER — Telehealth: Payer: Self-pay

## 2023-07-10 ENCOUNTER — Ambulatory Visit: Payer: Self-pay | Admitting: Nurse Practitioner

## 2023-07-10 NOTE — Telephone Encounter (Signed)
 Please advise [facility]

## 2023-07-12 ENCOUNTER — Other Ambulatory Visit: Payer: Self-pay

## 2023-07-12 DIAGNOSIS — M7662 Achilles tendinitis, left leg: Secondary | ICD-10-CM

## 2023-07-12 MED ORDER — MELOXICAM 7.5 MG PO TABS: 7.5000 mg | ORAL_TABLET | Freq: Every day | ORAL | 0 refills | Status: DC

## 2023-07-12 NOTE — Telephone Encounter (Signed)
 Please advise [facility]

## 2023-07-19 ENCOUNTER — Encounter: Payer: Self-pay | Admitting: Obstetrics and Gynecology

## 2023-07-22 ENCOUNTER — Other Ambulatory Visit: Payer: Self-pay | Admitting: Nurse Practitioner

## 2023-07-22 NOTE — Telephone Encounter (Signed)
 We can do a limited refill.  She missed her last appt here and does not have a follow up scheduled.  Last saw her in October 2024.  I started her back on Jardiance between visits through MyChart message.

## 2023-07-25 ENCOUNTER — Ambulatory Visit (INDEPENDENT_AMBULATORY_CARE_PROVIDER_SITE_OTHER): Payer: Self-pay | Admitting: Nurse Practitioner

## 2023-07-25 ENCOUNTER — Other Ambulatory Visit: Payer: Self-pay | Admitting: Nurse Practitioner

## 2023-07-25 ENCOUNTER — Encounter: Payer: Self-pay | Admitting: Nurse Practitioner

## 2023-07-25 VITALS — BP 118/72 | HR 78 | Ht 66.0 in | Wt 273.2 lb

## 2023-07-25 DIAGNOSIS — J4530 Mild persistent asthma, uncomplicated: Secondary | ICD-10-CM

## 2023-07-25 DIAGNOSIS — Z794 Long term (current) use of insulin: Secondary | ICD-10-CM

## 2023-07-25 DIAGNOSIS — G4700 Insomnia, unspecified: Secondary | ICD-10-CM

## 2023-07-25 DIAGNOSIS — Z7984 Long term (current) use of oral hypoglycemic drugs: Secondary | ICD-10-CM

## 2023-07-25 DIAGNOSIS — E1165 Type 2 diabetes mellitus with hyperglycemia: Secondary | ICD-10-CM

## 2023-07-25 DIAGNOSIS — E119 Type 2 diabetes mellitus without complications: Secondary | ICD-10-CM | POA: Diagnosis not present

## 2023-07-25 MED ORDER — INSULIN GLARGINE 100 UNIT/ML SOLOSTAR PEN: 90.0000 [IU] | PEN_INJECTOR | Freq: Every day | SUBCUTANEOUS | 3 refills | Status: DC

## 2023-07-25 MED ORDER — EMPAGLIFLOZIN 10 MG PO TABS: 10.0000 mg | ORAL_TABLET | Freq: Every day | ORAL | 1 refills | Status: DC

## 2023-07-25 NOTE — Progress Notes (Signed)
 Endocrinology Follow Up Note       07/25/2023, 4:14 PM   Subjective:    Patient ID: Lori Shea, female    DOB: 26-Apr-1975.  Lori Shea is being seen in follow up after being seen in consultation for management of currently uncontrolled symptomatic diabetes requested by  Ivonne Andrew, NP.   Past Medical History:  Diagnosis Date   Allergy    Foods:  mango, honey, corn.  Allergic to "everything tested"  15 years ago in [place]   Anemia    with pregnancy   Anxiety    Arthritis    Shoulder and hips   Asthma age 22 yo   Triggers:  cold weather, exercise, allergens, anxiety, URIs   Cholelithiasis age 35 yo   Asymptomatic   Closed avulsion fracture of lateral malleolus of right fibula 12/27/2016   COVID-19    Diabetes mellitus type 2 2012   Was diagnosed 2 years earlier with PCOS   Elevated liver enzymes 09/13/2016   none since 2018   GERD (gastroesophageal reflux disease)    Heart murmur    with pregnancy 20 yrs ago per pt on 10-12-2021   History of kidney stones    Hypertension 2015   Kidney stones    family history of renal failure   Migraines 2009   Morbid obesity (HCC)    PCOS (polycystic ovarian syndrome) 2010   Pneumonia    several years ago   Wears glasses     Past Surgical History:  Procedure Laterality Date   CHOLECYSTECTOMY N/A 12/01/2019   Procedure: LAPAROSCOPIC CHOLECYSTECTOMY;  Surgeon: Darnell Level, MD;  Location: [facility];  Service: General;  Laterality: N/A;   colonscopy and endoscopy  05/25/2021   COLPOSCOPY N/A 2021   CYST EXCISION     DILITATION & CURRETTAGE/HYSTROSCOPY WITH NOVASURE ABLATION N/A 10/18/2021   Procedure: DILATATION & CURETTAGE/HYSTEROSCOPY WITH NOVASURE ABLATION;  Surgeon: Ames Bing, MD;  Location: [facility];  Service: Gynecology;  Laterality: N/A;   LAPAROSCOPIC TUBAL LIGATION Bilateral 01/28/2019   Procedure: LAPAROSCOPIC  TUBAL LIGATION AND PAP SMEAR;  Surgeon: Turney Bing, MD;  Location: [facility];  Service: Gynecology;  Laterality: Bilateral;   TONSILLECTOMY AND ADENOIDECTOMY Bilateral 12/23/2018   Procedure: TONSILLECTOMY AND ADENOIDECTOMY;  Surgeon: Newman Pies, MD;  Location:  SURGERY CENTER;  Service: ENT;  Laterality: Bilateral;    Social History   Socioeconomic History   Marital status: Divorced    Spouse name: Not on file   Number of children: 1   Years of education: some comm. college   Highest education level: Not on file  Occupational History   Occupation: CNA    Comment: [employer]  Tobacco Use   Smoking status: Never   Smokeless tobacco: Never  Vaping Use   Vaping status: Never Used  Substance and Sexual Activity   Alcohol use: No   Drug use: No   Sexual activity: Not Currently    Birth control/protection: Surgical    Comment: Sprintec.  Tubal ligation  Other Topics Concern   Not on file  Social History Narrative   Originally from [place], [place] to [place]  in 1988.   In [place] in 1999   Lives at home with daughter on [place].   Social Drivers of Corporate investment banker Strain: Not on file  Food Insecurity: No Food Insecurity (10/02/2021)   Hunger Vital Sign    Worried About Running Out of Food in the Last Year: Never true    Ran Out of Food in the Last Year: Never true  Transportation Needs: No Transportation Needs (10/02/2021)   PRAPARE - Administrator, Civil Service (Medical): No    Lack of Transportation (Non-Medical): No  Physical Activity: Not on file  Stress: Not on file  Social Connections: Unknown (09/05/2021)   [facility] from [facility]   Social Network    Social Network: Not on file    Family History  Problem Relation Age of Onset   Healthy Mother    Diabetes Father    Hyperlipidemia Father    Hypertension Father    Heart disease Father    Kidney disease  Father        Developed after 2nd CABG   Colon polyps Father    Diabetes Paternal Grandfather    Heart disease Paternal Grandfather    Hyperlipidemia Paternal Grandfather    Hypertension Paternal Grandfather    Kidney disease Paternal Grandfather     Outpatient Encounter Medications as of 07/25/2023  Medication Sig   albuterol (PROVENTIL) (2.5 MG/3ML) 0.083% nebulizer solution USE 1 VIAL VIA NEBULIZER EVERY 6 HOURS AS NEEDED FOR WHEEZING OR SHORTNESS OF BREATH   albuterol (VENTOLIN HFA) 108 (90 Base) MCG/ACT inhaler INHALE 2 PUFFS INTO THE LUNGS EVERY 4 HOURS AS NEEDED FOR WHEEZING OR SHORTNESS OF BREATH   amitriptyline (ELAVIL) 25 MG tablet TAKE 1 TABLET BY MOUTH AT BEDTIME   Bacillus Coagulans-Inulin (PROBIOTIC-PREBIOTIC PO) Take 1 capsule by mouth daily.   cetirizine (ZYRTEC) 10 MG tablet Take 10 mg by mouth daily.   cholecalciferol (VITAMIN D3) 25 MCG (1000 UNIT) tablet Take 1,000 Units by mouth daily.   cholestyramine (QUESTRAN) 4 g packet Take 1 packet (4 g total) by mouth 2 (two) times daily.   Continuous Glucose Sensor (FREESTYLE LIBRE 3 SENSOR) MISC Use to check glucose continuously and change sensor every 14 days.   ELDERBERRY PO Take 1 tablet by mouth daily. She does not  know the strength   fluticasone-salmeterol (ADVAIR DISKUS) 100-50 MCG/ACT AEPB Inhale 1 puff into the lungs 2 (two) times daily.   ibuprofen (ADVIL) 600 MG tablet Take 1 tablet (600 mg total) by mouth every 6 (six) hours as needed.   insulin lispro (HUMALOG) 100 UNIT/ML KwikPen INJECT 16-22 UNITS into THE SKIN 3 TIMES DAILY BEFORE MEALS   Insulin Pen Needle (PEN NEEDLES) 31G X 8 MM MISC Use to inject insulin 4 times daily   JANUVIA 100 MG tablet TAKE 1 TABLET BY MOUTH EVERY DAY   lactobacillus acidophilus (BACID) TABS tablet Take 1 tablet by mouth daily.   meloxicam (MOBIC) 7.5 MG tablet Take 1 tablet (7.5 mg total) by mouth daily.   metFORMIN (GLUCOPHAGE) 500 MG tablet Take 1 tablet (500 mg total) by mouth 2  (two) times daily with a meal.   metoCLOPramide (REGLAN) 5 MG tablet Take 1 tablet (5 mg total) by mouth 2 (two) times daily as needed for nausea.   montelukast (SINGULAIR) 10 MG tablet Take 1 tablet by mouth daily.   Multiple Vitamin (MULTIVITAMIN WITH MINERALS) TABS tablet Take 1 tablet by mouth daily.  pantoprazole (PROTONIX) 40 MG tablet Take 1 tablet (40 mg total) by mouth 2 (two) times daily before a meal.   rizatriptan (MAXALT-MLT) 10 MG disintegrating tablet Take 1 tablet (10 mg total) by mouth as needed for migraine. May repeat in 2 hours if needed   TURMERIC PO Take 1 capsule by mouth daily. She does not know the strength   [DISCONTINUED] empagliflozin (JARDIANCE) 10 MG TABS tablet TAKE 1 TABLET BY MOUTH EVERY DAY   [DISCONTINUED] insulin glargine (LANTUS) 100 UNIT/ML Solostar Pen Inject 80 Units into the skin at bedtime.   empagliflozin (JARDIANCE) 10 MG TABS tablet Take 1 tablet (10 mg total) by mouth daily.   insulin glargine (LANTUS) 100 UNIT/ML Solostar Pen Inject 90 Units into the skin at bedtime.   olmesartan-hydrochlorothiazide (BENICAR HCT) 40-25 MG tablet TAKE 1 TABLET BY MOUTH EVERY DAY   No facility-administered encounter medications on file as of 07/25/2023.    ALLERGIES: Allergies  Allergen Reactions   Corn-Containing Products Hives    "really bad asthma attacks"   Honey Shortness Of Breath   Mango Flavoring Agent (Non-Screening) Hives and Swelling    Pt allergic to all mango - tongue and throat swelling   Glipizide     Other reaction(s): Other bad yeast infection; she feels she can take with probiotics   Liraglutide Nausea And Vomiting    victoza   Semaglutide Nausea Only    gastroparesis    VACCINATION STATUS: Immunization History  Administered Date(s) Administered   Pneumococcal Polysaccharide-23 12/18/2018   Pneumococcal-Unspecified 04/23/1997   Td 09/06/2004   Tdap 09/13/2016    Diabetes She presents for her follow-up diabetic visit. She has type  2 diabetes mellitus. Onset time: diagnosed at approx age of 83. Her disease course has been improving. There are no hypoglycemic associated symptoms. There are no diabetic associated symptoms. Pertinent negatives for diabetes include no polyuria and no weight loss. There are no hypoglycemic complications. There are no diabetic complications. (Recently diagnosed with gastroparesis) Risk factors for coronary artery disease include diabetes mellitus, dyslipidemia, obesity, hypertension and family history. Current diabetic treatment includes oral agent (dual therapy) and intensive insulin program. She is compliant with treatment most of the time. Her weight is increasing steadily. She is following a generally healthy diet. When asked about meal planning, she reported none. She has not had a previous visit with a dietitian. She participates in exercise daily. Her home blood glucose trend is decreasing steadily. Her overall blood glucose range is >200 mg/dl. (She presents today with her CGM showing slowly improving, yet still above target glycemic profile overall.  Her POCT A1c today is 8.2%, improving from last visit of 9.3%.  She notes she is not taking the prescribed dose of Novolog, says it was not effective, so she has only been doing 5 units.  She has had variable results with it, sometimes it will work really well and other times it does not help at all.  Analysis of her CGM shows TIR 13%, TAR 87%, TBR 0% with a GMI of 8.8%.  She is tolerating the Jardiance well, no unpleasant side effects, although she does feel like her kidney stone may be moving?  She did also do virtual visit recently for UTI and was given abx.) An ACE inhibitor/angiotensin II receptor blocker is being taken. She sees a podiatrist.Eye exam is current.     Review of systems  Constitutional: + decreasing body weight, current Body mass index is 44.1 kg/m., no fatigue, no subjective hyperthermia, no  subjective hypothermia Eyes: no blurry  vision, no xerophthalmia ENT: no sore throat, no nodules palpated in throat, no dysphagia/odynophagia, no hoarseness Cardiovascular: no chest pain, no shortness of breath, no palpitations, no leg swelling Respiratory: no cough, no shortness of breath Gastrointestinal: no nausea/vomiting/diarrhea Musculoskeletal: no muscle/joint aches Skin: no rashes, no hyperemia Neurological: no tremors, no numbness, no tingling, no dizziness Psychiatric: no depression, no anxiety  Objective:     BP 118/72 (BP Location: Left Arm, Patient Position: Sitting, Cuff Size: Large)   Pulse 78   Ht 5\' 6"  (1.676 m)   Wt 273 lb 3.2 oz (123.9 kg)   LMP 10/14/2021 (Exact Date)   BMI 44.10 kg/m   Wt Readings from Last 3 Encounters:  07/25/23 273 lb 3.2 oz (123.9 kg)  04/11/23 282 lb (127.9 kg)  04/09/23 277 lb 4.8 oz (125.8 kg)     BP Readings from Last 3 Encounters:  07/25/23 118/72  06/06/23 134/83  04/11/23 118/68      Physical Exam- Limited  Constitutional:  Body mass index is 44.1 kg/m. , not in acute distress, normal state of mind Eyes:  EOMI, no exophthalmos Musculoskeletal: no gross deformities, strength intact in all four extremities, no gross restriction of joint movements Skin:  no rashes, no hyperemia Neurological: no tremor with outstretched hands   Diabetic Foot Exam - Simple   Simple Foot Form Diabetic Foot exam was performed with the following findings: Yes 07/25/2023  3:26 PM  Visual Inspection No deformities, no ulcerations, no other skin breakdown bilaterally: Yes Sensation Testing Intact to touch and monofilament testing bilaterally: Yes Pulse Check Posterior Tibialis and Dorsalis pulse intact bilaterally: Yes Comments     CMP ( most recent) CMP     Component Value Date/Time   NA 140 12/13/2022 0841   NA 138 11/01/2022 1131   K 3.6 12/13/2022 0841   CL 106 12/13/2022 0841   CO2 23 12/13/2022 0841   GLUCOSE 156 (H) 12/13/2022 0841   BUN 18 12/13/2022 0841   BUN  13 11/01/2022 1131   CREATININE 0.72 12/13/2022 0841   CALCIUM 8.9 12/13/2022 0841   PROT 6.8 12/13/2022 0841   PROT 6.9 11/01/2022 1131   ALBUMIN 3.8 12/13/2022 0841   ALBUMIN 4.2 11/01/2022 1131   AST 23 12/13/2022 0841   ALT 24 12/13/2022 0841   ALKPHOS 77 12/13/2022 0841   BILITOT 0.4 12/13/2022 0841   BILITOT 0.4 11/01/2022 1131   GFRNONAA >60 01/04/2021 1249   GFRAA 119 03/03/2020 1246     Diabetic Labs (most recent): Lab Results  Component Value Date   HGBA1C 9.3 (A) 02/06/2023   HGBA1C 8.5 (A) 10/10/2022   HGBA1C 7.7 (A) 04/25/2022   MICROALBUR 4.03 (H) 02/09/2009     Lipid Panel ( most recent) Lipid Panel     Component Value Date/Time   CHOL 181 11/01/2022 1131   TRIG 205 (H) 11/01/2022 1131   HDL 37 (L) 11/01/2022 1131   CHOLHDL 4.9 (H) 11/01/2022 1131   CHOLHDL 3.6 Ratio 02/09/2009 2112   VLDL 36 02/09/2009 2112   LDLCALC 108 (H) 11/01/2022 1131   LABVLDL 36 11/01/2022 1131      Lab Results  Component Value Date   TSH 3.770 11/01/2022   TSH 2.780 02/11/2020   TSH 5.090 (H) 02/10/2018   TSH 4.370 04/08/2017   TSH 3.370 02/22/2011   TSH 2.259 02/07/2010   TSH 1.924 02/09/2009   FREET4 1.05 11/01/2022   FREET4 1.14 02/19/2018  Assessment & Plan:   1) Type 2 diabetes mellitus with hyperglycemia, with long-term current use of insulin (HCC)  She presents today with her CGM showing slowly improving, yet still above target glycemic profile overall.  Her POCT A1c today is 8.2%, improving from last visit of 9.3%.  She notes she is not taking the prescribed dose of Novolog, says it was not effective, so she has only been doing 5 units.  She has had variable results with it, sometimes it will work really well and other times it does not help at all.  Analysis of her CGM shows TIR 13%, TAR 87%, TBR 0% with a GMI of 8.8%.  She is tolerating the Jardiance well, no unpleasant side effects, although she does feel like her kidney stone may be moving?   She did also do virtual visit recently for UTI and was given abx.  - Lori Shea has currently uncontrolled symptomatic type 2 DM since 48 years of age.   -Recent labs reviewed.  - I had a long discussion with her about the progressive nature of diabetes and the pathology behind its complications. -her diabetes is not currently complicated but she remains at a high risk for more acute and chronic complications which include CAD, CVA, CKD, retinopathy, and neuropathy. These are all discussed in detail with her.  The following Lifestyle Medicine recommendations according to American College of Lifestyle Medicine [facility]) were discussed and offered to patient and she agrees to start the journey:  A. Whole Foods, Plant-based plate comprising of fruits and vegetables, plant-based proteins, whole-grain carbohydrates was discussed in detail with the patient.   A list for source of those nutrients were also provided to the patient.  Patient will use only water or unsweetened tea for hydration. B.  The need to stay away from risky substances including alcohol, smoking; obtaining 7 to 9 hours of restorative sleep, at least 150 minutes of moderate intensity exercise weekly, the importance of healthy social connections,  and stress reduction techniques were discussed. C.  A full color page of  Calorie density of various food groups per pound showing examples of each food groups was provided to the patient.  - Nutritional counseling repeated at each appointment due to patients tendency to fall back in to old habits.  - The patient admits there is a room for improvement in their diet and drink choices. -  Suggestion is made for the patient to avoid simple carbohydrates from their diet including Cakes, Sweet Desserts / Pastries, Ice Cream, Soda (diet and regular), Sweet Tea, Candies, Chips, Cookies, Sweet Pastries, Store Bought Juices, Alcohol in Excess of 1-2 drinks a day, Artificial Sweeteners, Coffee  Creamer, and "Sugar-free" Products. This will help patient to have stable blood glucose profile and potentially avoid unintended weight gain.   - I encouraged the patient to switch to unprocessed or minimally processed complex starch and increased protein intake (animal or plant source), fruits, and vegetables.   - Patient is advised to stick to a routine mealtimes to eat 3 meals a day and avoid unnecessary snacks (to snack only to correct hypoglycemia).  - I have approached her with the following individualized plan to manage her diabetes and patient agrees:   -She is advised to increase her Lantus to 90 units SQ nightly (switched from [place] due to insurance pref) and continue Novolog 5 units TID with meals if glucose is above 90 and she is eating.  She can also continue Jardiance 10 mg po daily,  Metformin 500 mg po twice daily and Januvia 100 mg po daily.   -she is encouraged to start monitoring glucose 4 times daily (using her CGM), before meals and before bed and to call the clinic if she has readings less than 70 or above 300 for 3 tests in a row.    - she is warned not to take insulin without proper monitoring per orders. - Adjustment parameters are given to her for hypo and hyperglycemia in writing.  - Specific targets for  A1c; LDL, HDL, and Triglycerides were discussed with the patient.  2) Blood Pressure /Hypertension:  her blood pressure is controlled to target.   she is advised to continue her current medications including Benicar 40-25 mg po daily.  3) Lipids/Hyperlipidemia:    Review of her recent lipid panel from 11/01/22 showed uncontrolled LDL at 108 and elevated triglycerides of 205 (worsening).  She is not currently on any lipid lowering medications, was on Simvastatin but stopped after recent recall.    4)  Weight/Diet:  her Body mass index is 44.1 kg/m.  -  clearly complicating her diabetes care.   she is a candidate for weight loss. I discussed with her the fact that  loss of 5 - 10% of her  current body weight will have the most impact on her diabetes management.  Exercise, and detailed carbohydrates information provided  -  detailed on discharge instructions.  5) Chronic Care/Health Maintenance: -she is on ACEI/ARB and not currently on Statin medications and is encouraged to initiate and continue to follow up with Ophthalmology, Dentist, Podiatrist at least yearly or according to recommendations, and advised to stay away from smoking. I have recommended yearly flu vaccine and pneumonia vaccine at least every 5 years; moderate intensity exercise for up to 150 minutes weekly; and sleep for at least 7 hours a day.  - she is advised to maintain close follow up with Ivonne Andrew, NP for primary care needs, as well as her other providers for optimal and coordinated care.     I spent  46  minutes in the care of the patient today including review of labs from CMP, Lipids, Thyroid Function, Hematology (current and previous including abstractions from other facilities); face-to-face time discussing  her blood glucose readings/logs, discussing hypoglycemia and hyperglycemia episodes and symptoms, medications doses, her options of short and long term treatment based on the latest standards of care / guidelines;  discussion about incorporating lifestyle medicine;  and documenting the encounter. Risk reduction counseling performed per USPSTF guidelines to reduce obesity and cardiovascular risk factors.     Please refer to Patient Instructions for Blood Glucose Monitoring and Insulin/Medications Dosing Guide"  in media tab for additional information. Please  also refer to " Patient Self Inventory" in the Media  tab for reviewed elements of pertinent patient history.  Bjorn Pippin participated in the discussions, expressed understanding, and voiced agreement with the above plans.  All questions were answered to her satisfaction. she is encouraged to contact clinic should  she have any questions or concerns prior to her return visit.     Follow up plan: - Return in about 4 months (around 11/24/2023) for Diabetes F/U with A1c in office, No previsit labs, Bring meter and logs.   Ronny Bacon, [facility] [address] [place], [place] [address] Phone: [phone] Fax: [phone]  07/25/2023, 4:14 PM

## 2023-07-26 ENCOUNTER — Encounter: Payer: Self-pay | Admitting: Nurse Practitioner

## 2023-07-26 ENCOUNTER — Ambulatory Visit (INDEPENDENT_AMBULATORY_CARE_PROVIDER_SITE_OTHER): Payer: Self-pay | Admitting: Nurse Practitioner

## 2023-07-26 VITALS — BP 121/84 | HR 84 | Temp 98.2°F | Wt 273.6 lb

## 2023-07-26 DIAGNOSIS — R399 Unspecified symptoms and signs involving the genitourinary system: Secondary | ICD-10-CM | POA: Diagnosis not present

## 2023-07-26 DIAGNOSIS — E1165 Type 2 diabetes mellitus with hyperglycemia: Secondary | ICD-10-CM | POA: Diagnosis not present

## 2023-07-26 DIAGNOSIS — Z87442 Personal history of urinary calculi: Secondary | ICD-10-CM | POA: Diagnosis not present

## 2023-07-26 DIAGNOSIS — Z13220 Encounter for screening for lipoid disorders: Secondary | ICD-10-CM

## 2023-07-26 DIAGNOSIS — R300 Dysuria: Secondary | ICD-10-CM | POA: Diagnosis not present

## 2023-07-26 DIAGNOSIS — Z794 Long term (current) use of insulin: Secondary | ICD-10-CM

## 2023-07-26 LAB — POCT URINALYSIS DIP (CLINITEK)
Bilirubin, UA: NEGATIVE
Blood, UA: NEGATIVE
Glucose, UA: >=1000 mg/dL — AB
Ketones, POC UA: NEGATIVE mg/dL
Leukocytes, UA: NEGATIVE
Nitrite, UA: NEGATIVE
POC PROTEIN,UA: NEGATIVE
Spec Grav, UA: >=1.03 — AB (ref 1.010–1.025)
Urobilinogen, UA: 0.2 U/dL
pH, UA: 5.5 (ref 5.0–8.0)

## 2023-07-26 MED ORDER — ROSUVASTATIN CALCIUM 10 MG PO TABS: 10.0000 mg | ORAL_TABLET | Freq: Every day | ORAL | 3 refills | Status: DC

## 2023-07-26 NOTE — Patient Instructions (Signed)
 1. Type 2 diabetes mellitus with hyperglycemia, with long-term current use of insulin (HCC) (Primary)   2. UTI symptoms  - POCT URINALYSIS DIP (CLINITEK) - Ambulatory referral to Urology  3. History of kidney stones  - Ambulatory referral to Urology  4. Burning with urination  - NuSwab Vaginitis Plus (VG+)

## 2023-07-26 NOTE — Telephone Encounter (Signed)
 Please advise [facility]

## 2023-07-26 NOTE — Progress Notes (Signed)
 Subjective   Patient ID: Lori Shea, female    DOB: 13-Jun-1975, 48 y.o.   MRN: [id number]  Chief Complaint  Patient presents with   Medical Management of Chronic Issues   Urinary Tract Infection   Diabetes    Patient stated she got her A1C checked yesterday 07/25/2023 at [facility] and it was 8.2      Referring provider: Ivonne Andrew, NP  Lori Shea is a 48 y.o. female with Past Medical History: No date: Allergy     Comment:  Foods:  mango, honey, corn.  Allergic to "everything               tested"  15 years ago in [place] No date: Anemia     Comment:  with pregnancy No date: Anxiety No date: Arthritis     Comment:  Shoulder and hips age 62 yo: Asthma     Comment:  Triggers:  cold weather, exercise, allergens, anxiety,               URIs age 58 yo: Cholelithiasis     Comment:  Asymptomatic 12/27/2016: Closed avulsion fracture of lateral malleolus of right  fibula No date: COVID-19 2012: Diabetes mellitus type 2     Comment:  Was diagnosed 2 years earlier with PCOS 09/13/2016: Elevated liver enzymes     Comment:  none since 2018 No date: GERD (gastroesophageal reflux disease) No date: Heart murmur     Comment:  with pregnancy 20 yrs ago per pt on 10-12-2021 No date: History of kidney stones 2015: Hypertension No date: Kidney stones     Comment:  family history of renal failure 2009: Migraines No date: Morbid obesity (HCC) 2010: PCOS (polycystic ovarian syndrome) No date: Pneumonia     Comment:  several years ago No date: Wears glasses   HPI  Diabetes Mellitus: Patient presents for follow up of diabetes. Patient is followed by endocrinology. Symptoms: none. Patient denies any symptoms. Patient denies foot ulcerations, hypoglycemia , and nausea.  Evaluation to date has been included: hemoglobin A1C. Treatment to date: no recent interventions. Will continue to follow with endocrinology. A1C in October was 9.3. she is followed by  endocrinology.  She is unhappy with her current diabetic medication regimen.  This is managed by endocrinology but we will place a referral for pharmacy for diabetic medication management to confer with endocrinology.     Denies any fatigue, chest pain, shortness of breath, HA or dizziness. Denies any blurred vision, numbness or tingling.  Patient requested a UA today for follow-up on recent UTI.  Finished bactrim 1 week ago from UC. This was for UTI.  UA in office today was clear.  Patient also concerned for yeast.  We will check new swab today.  Patient would like a referral back to urology due to history of kidney stones.  Patient was seen by endocrinology yesterday.  Endocrinology wanted cortisol levels checked today with blood draw.   Allergies  Allergen Reactions   Corn-Containing Products Hives    "really bad asthma attacks"   Honey Shortness Of Breath   Mango Flavoring Agent (Non-Screening) Hives and Swelling    Pt allergic to all mango - tongue and throat swelling   Glipizide     Other reaction(s): Other bad yeast infection; she feels she can take with probiotics   Liraglutide Nausea And Vomiting    victoza   Semaglutide Nausea Only    gastroparesis    Immunization History  Administered Date(s) Administered   Pneumococcal Polysaccharide-23 12/18/2018   Pneumococcal-Unspecified 04/23/1997   Td 09/06/2004   Tdap 09/13/2016    Tobacco History: Social History   Tobacco Use  Smoking Status Never  Smokeless Tobacco Never   Counseling given: Not Answered   Outpatient Encounter Medications as of 07/26/2023  Medication Sig   albuterol (PROVENTIL) (2.5 MG/3ML) 0.083% nebulizer solution USE 1 VIAL VIA NEBULIZER EVERY 6 HOURS AS NEEDED FOR WHEEZING OR SHORTNESS OF BREATH   albuterol (VENTOLIN HFA) 108 (90 Base) MCG/ACT inhaler INHALE 2 PUFFS INTO THE LUNGS EVERY 4 HOURS AS NEEDED FOR WHEEZING OR SHORTNESS OF BREATH   amitriptyline (ELAVIL) 25 MG tablet TAKE 1 TABLET BY MOUTH  AT BEDTIME   Bacillus Coagulans-Inulin (PROBIOTIC-PREBIOTIC PO) Take 1 capsule by mouth daily.   cetirizine (ZYRTEC) 10 MG tablet Take 10 mg by mouth daily.   cholecalciferol (VITAMIN D3) 25 MCG (1000 UNIT) tablet Take 1,000 Units by mouth daily.   cholestyramine (QUESTRAN) 4 g packet Take 1 packet (4 g total) by mouth 2 (two) times daily.   Continuous Glucose Sensor (FREESTYLE LIBRE 3 SENSOR) MISC Use to check glucose continuously and change sensor every 14 days.   ELDERBERRY PO Take 1 tablet by mouth daily. She does not  know the strength   empagliflozin (JARDIANCE) 10 MG TABS tablet Take 1 tablet (10 mg total) by mouth daily.   fluticasone-salmeterol (ADVAIR DISKUS) 100-50 MCG/ACT AEPB Inhale 1 puff into the lungs 2 (two) times daily.   ibuprofen (ADVIL) 600 MG tablet Take 1 tablet (600 mg total) by mouth every 6 (six) hours as needed.   insulin glargine (LANTUS) 100 UNIT/ML Solostar Pen Inject 90 Units into the skin at bedtime.   insulin lispro (HUMALOG) 100 UNIT/ML KwikPen INJECT 16-22 UNITS into THE SKIN 3 TIMES DAILY BEFORE MEALS   Insulin Pen Needle (PEN NEEDLES) 31G X 8 MM MISC Use to inject insulin 4 times daily   JANUVIA 100 MG tablet TAKE 1 TABLET BY MOUTH EVERY DAY   lactobacillus acidophilus (BACID) TABS tablet Take 1 tablet by mouth daily.   meloxicam (MOBIC) 7.5 MG tablet Take 1 tablet (7.5 mg total) by mouth daily.   metFORMIN (GLUCOPHAGE) 500 MG tablet Take 1 tablet (500 mg total) by mouth 2 (two) times daily with a meal.   metoCLOPramide (REGLAN) 5 MG tablet Take 1 tablet (5 mg total) by mouth 2 (two) times daily as needed for nausea.   montelukast (SINGULAIR) 10 MG tablet TAKE 1 TABLET BY MOUTH DAILY   Multiple Vitamin (MULTIVITAMIN WITH MINERALS) TABS tablet Take 1 tablet by mouth daily.   olmesartan-hydrochlorothiazide (BENICAR HCT) 40-25 MG tablet TAKE 1 TABLET BY MOUTH EVERY DAY   pantoprazole (PROTONIX) 40 MG tablet Take 1 tablet (40 mg total) by mouth 2 (two) times  daily before a meal.   rizatriptan (MAXALT-MLT) 10 MG disintegrating tablet Take 1 tablet (10 mg total) by mouth as needed for migraine. May repeat in 2 hours if needed   rosuvastatin (CRESTOR) 10 MG tablet Take 1 tablet (10 mg total) by mouth daily.   TURMERIC PO Take 1 capsule by mouth daily. She does not know the strength   [DISCONTINUED] montelukast (SINGULAIR) 10 MG tablet Take 1 tablet by mouth daily.   No facility-administered encounter medications on file as of 07/26/2023.    Review of Systems  Review of Systems  Constitutional: Negative.   HENT: Negative.    Cardiovascular: Negative.   Gastrointestinal: Negative.   Allergic/Immunologic: Negative.  Neurological: Negative.   Psychiatric/Behavioral: Negative.       Objective:   BP 121/84   Pulse 84   Temp 98.2 F (36.8 C) (Oral)   Wt 273 lb 9.6 oz (124.1 kg)   LMP 10/14/2021 (Exact Date)   SpO2 100%   BMI 44.16 kg/m   Wt Readings from Last 5 Encounters:  07/26/23 273 lb 9.6 oz (124.1 kg)  07/25/23 273 lb 3.2 oz (123.9 kg)  04/11/23 282 lb (127.9 kg)  04/09/23 277 lb 4.8 oz (125.8 kg)  12/19/22 275 lb 12.8 oz (125.1 kg)     Physical Exam Vitals and nursing note reviewed.  Constitutional:      General: She is not in acute distress.    Appearance: She is well-developed.  Cardiovascular:     Rate and Rhythm: Normal rate and regular rhythm.  Pulmonary:     Effort: Pulmonary effort is normal.     Breath sounds: Normal breath sounds.  Neurological:     Mental Status: She is alert and oriented to person, place, and time.       Assessment & Plan:   Type 2 diabetes mellitus with hyperglycemia, with long-term current use of insulin (HCC) -     CBC -     Comprehensive metabolic panel with GFR -     Cortisol -     Rosuvastatin Calcium; Take 1 tablet (10 mg total) by mouth daily.  Dispense: 90 tablet; Refill: 3  UTI symptoms -     POCT URINALYSIS DIP (CLINITEK) -     Ambulatory referral to  Urology  History of kidney stones -     Ambulatory referral to Urology  Burning with urination -     NuSwab Vaginitis Plus (VG+)  Lipid screening -     Lipid panel     Return in about 3 months (around 10/25/2023).   Ivonne Andrew, NP 07/26/2023

## 2023-07-27 LAB — COMPREHENSIVE METABOLIC PANEL WITH GFR
ALT: 46 IU/L — ABNORMAL HIGH (ref 0–32)
AST: 38 IU/L (ref 0–40)
Albumin: 4.1 g/dL (ref 3.9–4.9)
Alkaline Phosphatase: 94 IU/L (ref 44–121)
BUN/Creatinine Ratio: 18 (ref 9–23)
BUN: 16 mg/dL (ref 6–24)
Bilirubin Total: 0.4 mg/dL (ref 0.0–1.2)
CO2: 24 mmol/L (ref 20–29)
Calcium: 9.1 mg/dL (ref 8.7–10.2)
Chloride: 102 mmol/L (ref 96–106)
Creatinine, Ser: 0.9 mg/dL (ref 0.57–1.00)
Globulin, Total: 2.4 g/dL (ref 1.5–4.5)
Glucose: 172 mg/dL — ABNORMAL HIGH (ref 70–99)
Potassium: 4.1 mmol/L (ref 3.5–5.2)
Sodium: 141 mmol/L (ref 134–144)
Total Protein: 6.5 g/dL (ref 6.0–8.5)
eGFR: 78 mL/min/{1.73_m2} (ref >59)

## 2023-07-27 LAB — CBC
Hematocrit: 43.2 % (ref 34.0–46.6)
Hemoglobin: 14.1 g/dL (ref 11.1–15.9)
MCH: 29.3 pg (ref 26.6–33.0)
MCHC: 32.6 g/dL (ref 31.5–35.7)
MCV: 90 fL (ref 79–97)
Platelets: 310 10*3/uL (ref 150–450)
RBC: 4.82 x10E6/uL (ref 3.77–5.28)
RDW: 13.7 % (ref 11.7–15.4)
WBC: 9.2 10*3/uL (ref 3.4–10.8)

## 2023-07-27 LAB — LIPID PANEL
Chol/HDL Ratio: 4.8 ratio — ABNORMAL HIGH (ref 0.0–4.4)
Cholesterol, Total: 187 mg/dL (ref 100–199)
HDL: 39 mg/dL — ABNORMAL LOW (ref >39)
LDL Chol Calc (NIH): 116 mg/dL — ABNORMAL HIGH (ref 0–99)
Triglycerides: 182 mg/dL — ABNORMAL HIGH (ref 0–149)
VLDL Cholesterol Cal: 32 mg/dL (ref 5–40)

## 2023-07-27 LAB — CORTISOL: Cortisol: 8.9 ug/dL (ref 6.2–19.4)

## 2023-07-29 ENCOUNTER — Other Ambulatory Visit: Payer: Self-pay | Admitting: Nurse Practitioner

## 2023-07-29 LAB — NUSWAB VAGINITIS PLUS (VG+)
Candida albicans, NAA: NEGATIVE
Candida glabrata, NAA: POSITIVE — AB
Chlamydia trachomatis, NAA: NEGATIVE
Neisseria gonorrhoeae, NAA: NEGATIVE
Trich vag by NAA: NEGATIVE

## 2023-07-29 MED ORDER — FLUCONAZOLE 150 MG PO TABS: 150.0000 mg | ORAL_TABLET | Freq: Every day | ORAL | 0 refills | Status: DC

## 2023-08-02 ENCOUNTER — Other Ambulatory Visit: Payer: Self-pay

## 2023-08-02 DIAGNOSIS — M7661 Achilles tendinitis, right leg: Secondary | ICD-10-CM

## 2023-08-02 MED ORDER — MELOXICAM 7.5 MG PO TABS
7.5000 mg | ORAL_TABLET | Freq: Every day | ORAL | 0 refills | Status: DC
Start: 2023-08-02 — End: 2023-10-01

## 2023-08-02 NOTE — Progress Notes (Unsigned)
 Error

## 2023-08-21 ENCOUNTER — Encounter: Payer: Self-pay | Admitting: Nurse Practitioner

## 2023-08-21 MED ORDER — EMPAGLIFLOZIN 25 MG PO TABS
25.0000 mg | ORAL_TABLET | Freq: Every day | ORAL | 1 refills | Status: DC
Start: 2023-08-21 — End: 2024-01-08

## 2023-08-21 MED ORDER — FREESTYLE LIBRE 3 PLUS SENSOR MISC
3 refills | Status: AC
Start: 2023-08-21 — End: ?

## 2023-08-21 NOTE — Addendum Note (Signed)
 Addended by: [place] Silveria J on: 08/21/2023 03:47 PM   Modules accepted: Orders

## 2023-08-22 ENCOUNTER — Encounter: Payer: Self-pay | Admitting: Obstetrics and Gynecology

## 2023-08-23 ENCOUNTER — Other Ambulatory Visit: Payer: Self-pay | Admitting: Nurse Practitioner

## 2023-08-23 DIAGNOSIS — G4700 Insomnia, unspecified: Secondary | ICD-10-CM

## 2023-08-23 DIAGNOSIS — J4530 Mild persistent asthma, uncomplicated: Secondary | ICD-10-CM

## 2023-08-26 ENCOUNTER — Other Ambulatory Visit: Payer: Self-pay

## 2023-08-26 ENCOUNTER — Ambulatory Visit: Admitting: Obstetrics and Gynecology

## 2023-08-26 ENCOUNTER — Encounter: Payer: Self-pay | Admitting: Obstetrics and Gynecology

## 2023-08-26 ENCOUNTER — Other Ambulatory Visit (HOSPITAL_COMMUNITY)
Admission: RE | Admit: 2023-08-26 | Discharge: 2023-08-26 | Disposition: A | Discharge status: Home / Self Care | Source: Ambulatory Visit | Attending: Obstetrics and Gynecology | Admitting: Obstetrics and Gynecology

## 2023-08-26 VITALS — BP 109/77 | HR 84 | Wt 267.0 lb

## 2023-08-26 DIAGNOSIS — Z124 Encounter for screening for malignant neoplasm of cervix: Secondary | ICD-10-CM

## 2023-08-26 DIAGNOSIS — Z1331 Encounter for screening for depression: Secondary | ICD-10-CM

## 2023-08-26 DIAGNOSIS — Z01419 Encounter for gynecological examination (general) (routine) without abnormal findings: Secondary | ICD-10-CM

## 2023-08-26 DIAGNOSIS — Z113 Encounter for screening for infections with a predominantly sexual mode of transmission: Secondary | ICD-10-CM

## 2023-08-26 NOTE — Progress Notes (Signed)
 ANNUAL EXAM Patient name: Lori Shea MRN [id number]  Date of birth: December 10, 1975 Chief Complaint:   Gynecologic Exam  History of Present Illness:   Lori Shea is a 48 y.o. [phone] being seen today for a routine annual exam.  Current complaints: none  Menstrual concerns? No   Breast or nipple changes? No  Contraception use? Yes  Sexually active? No    Patient's last menstrual period was 10/14/2021 (exact date).   The pregnancy intention screening data noted above was reviewed. Potential methods of contraception were discussed. The patient elected to proceed with No data recorded.   Last pap     Component Value Date/Time   DIAGPAP  11/09/2021 1330    - Negative for intraepithelial lesion or malignancy (NILM)   DIAGPAP  01/02/2021 0931    - Negative for intraepithelial lesion or malignancy (NILM)   DIAGPAP (A) 01/28/2019 0847    - Atypical squamous cells of undetermined significance (ASC-US )   HPVHIGH Negative 11/09/2021 1330   HPVHIGH Negative 01/02/2021 0931   HPVHIGH Positive (A) 01/28/2019 0847   ADEQPAP  11/09/2021 1330    Satisfactory for evaluation; transformation zone component PRESENT.   ADEQPAP  01/02/2021 0931    Satisfactory but limited for evaluation with partially obscuring   ADEQPAP  01/02/2021 0931    inflammation; transformation zone component present.   Last mammogram: 12/2020 BIRADS 2.  Last colonoscopy: 2023.      04/09/2023    5:37 PM 12/10/2022    8:12 AM 10/10/2022    8:59 AM 07/11/2022    9:43 AM 10/02/2021    9:38 AM  Depression screen PHQ 2/9  Decreased Interest 0 0 0 1 0  Down, Depressed, Hopeless 0 0 0 1 0  PHQ - 2 Score 0 0 0 2 0  Altered sleeping 0   0 0  Tired, decreased energy 1   1 0  Change in appetite 0   1 0  Feeling bad or failure about yourself  0   0 0  Trouble concentrating 0   2 0  Moving slowly or fidgety/restless 0   0 0  Suicidal thoughts 0   0 0  PHQ-9 Score 1   6 0  Difficult doing work/chores     Not  difficult at all        04/09/2023    5:37 PM 07/11/2022    9:43 AM 10/02/2021    9:38 AM 09/13/2021    1:44 PM  GAD 7 : Generalized Anxiety Score  Nervous, Anxious, on Edge 2 3 0 0  Control/stop worrying 2 2 0 0  Worry too much - different things 2 2 0 0  Trouble relaxing 2 2 0 0  Restless 0 1 0 0  Easily annoyed or irritable 2 3 0 0  Afraid - awful might happen 0 2 0 0  Total GAD 7 Score 10 15 0 0  Anxiety Difficulty  Very difficult Not difficult at all      Review of Systems:   Pertinent items are noted in HPI Denies any headaches, blurred vision, fatigue, shortness of breath, chest pain, abdominal pain, abnormal vaginal discharge/itching/odor/irritation, problems with periods, bowel movements, urination, or intercourse unless otherwise stated above. Pertinent History Reviewed:  Reviewed past medical,surgical, social and family history.  Reviewed problem list, medications and allergies. Physical Assessment:   Vitals:   08/26/23 1129  BP: 109/77  Pulse: 84  Weight: 267 lb (121.1 kg)  Body mass index is  43.09 kg/m.        Physical Examination:   General appearance - well appearing, and in no distress  Mental status - alert, oriented to person, place, and time  Psych:  She has a normal mood and affect  Skin - warm and dry, normal color, no suspicious lesions noted  Chest - effort normal, all lung fields clear to auscultation bilaterally  Heart - normal rate and regular rhythm  Breasts - breasts appear normal, no suspicious masses, no skin or nipple changes or  axillary nodes  Abdomen - soft, nontender, nondistended, no masses or organomegaly  Pelvic -  VULVA: normal appearing vulva with no masses, tenderness or lesions   VAGINA: normal appearing vagina with normal color and discharge, no lesions   CERVIX: normal appearing cervix without discharge or lesions, no CMT  Thin prep pap is done WITH HR HPV cotesting  UTERUS: uterus is felt to be normal size, shape,  consistency and nontender   ADNEXA: No adnexal masses or tenderness noted. Normal appearing vulva Normal vulvar sensation bilaterally Right levator ani tender Right ischiococcygeous tender Right obturator internus tender, similar to previously experienced pain Left levator ani mildly tender Left ischioccocygeous 1/10 Left obturator internus tender Uterus nontender   Extremities:  No swelling or varicosities noted  Chaperone present for exam  No results found for this or any previous visit (from the past 24 hours).    Assessment & Plan:  1. Well woman exam with routine gynecological exam (Primary) - Cervical cancer screening: Discussed guidelines. Pap with HPV collected - STD Testing: accepts - Birth Control: Discussed options and their risks, benefits and common side effects; discussed VTE with estrogen containing options. Desires: prior TL - Breast Health: Encouraged self breast awareness/SBE. Teaching provided. Discussed limits of clinical breast exam for detecting breast cancer. Rx given for MXR - F/U 12 months and prn  - Cytology - PAP  2. Screening for cervical cancer Pap collected - Cytology - PAP  3. Screening examination for STI - RPR+HBsAg+HCVAb+...  Orders Placed This Encounter  Procedures   MM 3D SCREENING MAMMOGRAM BILATERAL BREAST   RPR+HBsAg+HCVAb+...    Meds: No orders of the defined types were placed in this encounter.   Follow-up: No follow-ups on file.  Kiki Pelton, MD 08/26/2023 11:37 AM

## 2023-08-27 ENCOUNTER — Encounter: Payer: Self-pay | Admitting: Obstetrics and Gynecology

## 2023-08-27 LAB — RPR+HBSAG+HCVAB+...
HIV Screen 4th Generation wRfx: NONREACTIVE
Hep C Virus Ab: NONREACTIVE
Hepatitis B Surface Ag: NEGATIVE
RPR Ser Ql: NONREACTIVE

## 2023-08-30 LAB — CYTOLOGY - PAP
Comment: NEGATIVE
Diagnosis: NEGATIVE
High risk HPV: NEGATIVE

## 2023-09-02 ENCOUNTER — Other Ambulatory Visit: Payer: Self-pay | Admitting: Obstetrics and Gynecology

## 2023-09-02 DIAGNOSIS — B3731 Acute candidiasis of vulva and vagina: Secondary | ICD-10-CM

## 2023-09-02 MED ORDER — TERCONAZOLE 0.4 % VA CREA: 1.0000 | TOPICAL_CREAM | Freq: Every day | VAGINAL | 0 refills | Status: AC

## 2023-09-06 ENCOUNTER — Other Ambulatory Visit: Payer: Self-pay | Admitting: Nurse Practitioner

## 2023-09-06 DIAGNOSIS — I10 Essential (primary) hypertension: Secondary | ICD-10-CM

## 2023-09-09 ENCOUNTER — Other Ambulatory Visit: Payer: Self-pay | Admitting: Nurse Practitioner

## 2023-09-09 DIAGNOSIS — E1165 Type 2 diabetes mellitus with hyperglycemia: Secondary | ICD-10-CM

## 2023-09-09 DIAGNOSIS — E119 Type 2 diabetes mellitus without complications: Secondary | ICD-10-CM

## 2023-09-09 DIAGNOSIS — Z794 Long term (current) use of insulin: Secondary | ICD-10-CM

## 2023-09-09 DIAGNOSIS — Z7984 Long term (current) use of oral hypoglycemic drugs: Secondary | ICD-10-CM

## 2023-09-11 ENCOUNTER — Other Ambulatory Visit: Payer: Self-pay | Admitting: Nurse Practitioner

## 2023-09-11 ENCOUNTER — Other Ambulatory Visit: Payer: Self-pay

## 2023-09-11 DIAGNOSIS — G4700 Insomnia, unspecified: Secondary | ICD-10-CM

## 2023-09-11 DIAGNOSIS — J4530 Mild persistent asthma, uncomplicated: Secondary | ICD-10-CM

## 2023-09-11 MED ORDER — MONTELUKAST SODIUM 10 MG PO TABS
10.0000 mg | ORAL_TABLET | Freq: Every day | ORAL | 0 refills | Status: DC
Start: 2023-09-11 — End: 2023-09-30

## 2023-09-17 ENCOUNTER — Telehealth: Payer: Self-pay | Admitting: Gastroenterology

## 2023-09-17 NOTE — Telephone Encounter (Signed)
 Spoke with the pt and discussed her symptoms of gas, bloating and indigestion.  She was last seen 01/2022.  I have advised that she make an appt to discuss her complaints.  She agrees and appt has been made for 8/6 with GM.  I have also advised that she see her PCP in the meantime if she does not feel she can wait.  The pt has been advised of the information and verbalized understanding.

## 2023-09-17 NOTE — Telephone Encounter (Signed)
 Patient requesting a call back to discuss indigestion and belching.   Also having "smelly gas".   Please advise. Thank you

## 2023-09-23 ENCOUNTER — Other Ambulatory Visit: Payer: Self-pay | Admitting: Nurse Practitioner

## 2023-09-24 ENCOUNTER — Other Ambulatory Visit: Payer: Self-pay

## 2023-09-30 ENCOUNTER — Other Ambulatory Visit: Payer: Self-pay

## 2023-09-30 ENCOUNTER — Other Ambulatory Visit (HOSPITAL_COMMUNITY): Payer: Self-pay

## 2023-09-30 DIAGNOSIS — E1165 Type 2 diabetes mellitus with hyperglycemia: Secondary | ICD-10-CM

## 2023-09-30 NOTE — Progress Notes (Signed)
 09/30/2023 Name: Lori Shea MRN: [id number] DOB: October 23, 1975  Chief Complaint  Patient presents with   Diabetes   Hyperlipidemia    Lori Shea is a 48 y.o. year old female who presented for a telephone visit.   They were referred to the pharmacist by their PCP for assistance in managing diabetes. PMH includes HTN, gastroparesis, cholelithiasis with chronic cholecystitis, MASL, GERD, T2DM, PCSO, BMI > 40,   Subjective: Patient was last seen by PCP, Abbey Hobby, NP on 07/26/23. At that appt, BP was 121/84 mmHg. Patient reported that she had an A1C checked the day prior (07/25/23) and it was 8.2%, down from 9.3%.   Today patient reports that she is doing ok. She does have a bad headache and sinus irritation, and feels that is causing her BG to increase today.   Care Team: Primary Care Provider: Jerrlyn Morel, NP ; Next Scheduled Visit: 10/28/23 Endocrinology: Hulon Magic, NP; Scheduled 11/27/23 Gastroenterology: Yong Henle, MD: Scheduled 11/22/23  Medication Access/Adherence  Current Pharmacy:  [facility] - [place], [place] - [address] [place] [address] Phone: [phone] Fax: [phone]  [id number] - [place], [place] - [address] [place] [address] Phone: [phone] Fax: [phone]  [id number] - [place], [place] - [address] AT [facility] [address] [facility] [address] [place] [address] Phone: [phone] Fax: [phone]   Patient reports affordability concerns with their medications: No  Patient reports access/transportation concerns to their pharmacy: No  Patient reports adherence concerns with their medications:  No     Diabetes:  Current medications: Jardiance  25 mg daily, Lantus  90 units once daily at bedtime (reports taking 80 units), Humalog  16-22 units TID (patient takes 5 units with meals - typically takes  with her first bite), Januvia  100 mg daily, metformin  IR 500 mg BID Medications tried in the past: metformin  1000 mg BID - GI AE, glipizide  - yeast infection, liraglutide - n/v, Rybelsus  - n/v and gastroparesis  Denies UTI/yeast infections recently with Jardiance . Has been having lower back pain - but had recent urinalysis without s/sx of infection.   Current glucose readings:  Using Freestyle Libre 3 Plus CGM Reports that she had two sensors that were not working well - one was giving constant false lows, the next one - all the readings were ~30 points hight.  Date of Download: 09/30/23 % Time CGM is active: 87% Average Glucose: 207 mg/dL Glucose Management Indicator: 8.3  Glucose Variability: 23.6 (goal <36%) Time in Goal:  - Time in range 70-180: 35% - Time above range: 65% (20% very high) - Time below range: 0% Observed patterns: usually more elevated between 8PM-12AM - typically declines to ~140-180 overnight. Over past week - sensor       Patient reports hypoglycemic s/sx including dizziness, shakiness, sweating. Patient reports hyperglycemic symptoms including irritability, anxious. Denies polyuria, polydipsia, polyphagia, nocturia, neuropathy, blurred vision.  Current meal patterns: Has issues with digestion if she eats too much meat. - Breakfast: protein shake with a fruit (apple, banana) - usually is not hungry in the morning - Lunch: healthy lunch available at work - protein with vegetables - Supper - eats dinner around 5-5:30PM. Has a protein, starch (1/2 cup of rice, mashed potatoes), vegetables. Does usually have something sweet.  - Snacks - none - Drinks - drinking gallon of water daily  Current physical activity: Walking 2.5 miles daily  Current medication access support: none  Hypertension:  Current medications: olmesartan -hydrochlorothiazide 40-25 mg daily  Hyperlipidemia/ASCVD Risk Reduction  Current lipid lowering medications: rosuvastatin  10 mg  daily Medications tried in the past: simvastatin  - d/c in the setting of drug recall  Reports restlessness at first after starting rosuvastatin , now tolerating well.  Clinical ASCVD: No  The 10-year ASCVD risk score (Arnett DK, et al., 2019) is: 3%   Values used to calculate the score:     Age: 49 years     Sex: Female     Is Non-Hispanic African American: No     Diabetic: Yes     Tobacco smoker: No     Systolic Blood Pressure: 109 mmHg     Is BP treated: Yes     HDL Cholesterol: 39 mg/dL     Total Cholesterol: 187 mg/dL    Objective:  BP Readings from Last 3 Encounters:  08/26/23 109/77  07/26/23 121/84  07/25/23 118/72    Lab Results  Component Value Date   HGBA1C 9.3 (A) 02/06/2023   A1C per patient 07/25/23: 8.2%  Lab Results  Component Value Date   CREATININE 0.90 07/26/2023   BUN 16 07/26/2023   NA 141 07/26/2023   K 4.1 07/26/2023   CL 102 07/26/2023   CO2 24 07/26/2023    Lab Results  Component Value Date   CHOL 187 07/26/2023   HDL 39 (L) 07/26/2023   LDLCALC 116 (H) 07/26/2023   TRIG 182 (H) 07/26/2023   CHOLHDL 4.8 (H) 07/26/2023    Medications Reviewed Today     Reviewed by Adra Alanis, RPH (Pharmacist) on 09/30/23 at 1441  Med List Status: <None>   Medication Order Taking? Sig Documenting Provider Last Dose Status Informant  albuterol  (PROVENTIL ) (2.5 MG/3ML) 0.083% nebulizer solution [id number]  USE 1 VIAL VIA NEBULIZER EVERY 6 HOURS AS NEEDED FOR WHEEZING OR SHORTNESS OF Bailey Bolus, NP  Active   albuterol  (VENTOLIN  HFA) 108 (90 Base) MCG/ACT inhaler [id number]  INHALE 2 PUFFS INTO THE LUNGS EVERY 4 HOURS AS NEEDED FOR WHEEZING OR SHORTNESS OF BREATH Nichols, Tonya S, NP  Active   amitriptyline  (ELAVIL ) 25 MG tablet [id number] Yes TAKE 1 TABLET BY MOUTH AT BEDTIME Nichols, Tonya S, NP Taking Active   Bacillus Coagulans-Inulin (PROBIOTIC-PREBIOTIC PO) [id number]  Take 1 capsule by mouth daily. [provider]  Active    cetirizine  (ZYRTEC ) 10 MG tablet [id number]  Take 10 mg by mouth daily. [provider]  Active   cholecalciferol (VITAMIN D3) 25 MCG (1000 UNIT) tablet [id number]  Take 1,000 Units by mouth daily. [provider]  Active   cholestyramine  (QUESTRAN ) 4 g packet [id number]  Take 1 packet (4 g total) by mouth 2 (two) times daily. Mansouraty, Albino Alu., MD  Active            Med Note Vallarie Gauze, [facility] R   Fri Jul 05, 2023 11:49 AM) Taking differently: 1 packet PRN   Continuous Glucose Sensor (FREESTYLE LIBRE 3 PLUS SENSOR) MISC [id number] Yes Change sensor every 15 days. Wendel Hals, NP Taking Active   ELDERBERRY PO [id number]  Take 1 tablet by mouth daily. She does not  know the strength [provider]  Active   empagliflozin  (JARDIANCE ) 25 MG TABS tablet [id number] Yes Take 1 tablet (25 mg total) by mouth daily. Wendel Hals, NP Taking Active   fluticasone -salmeterol (ADVAIR DISKUS) 100-50 MCG/ACT AEPB [id number]  Inhale 1 puff into the lungs 2 (two) times daily. Jerrlyn Morel, NP  Active   ibuprofen  (ADVIL ) 600 MG tablet [id number]  Take 1 tablet (600 mg total) by mouth every 6 (six) hours as needed. Raynell Caller, MD  Active   insulin  glargine (LANTUS ) 100 UNIT/ML Solostar Pen [id number] Yes Inject 90 Units into the skin at bedtime. Wendel Hals, NP Taking Active            Med Note Obadiah Bellow Sep 30, 2023  1:46 PM) Taking 80 units daily  insulin  lispro (HUMALOG ) 100 UNIT/ML KwikPen [id number] Yes INJECT 16-22 UNITS into THE SKIN 3 TIMES DAILY BEFORE MEALS Wendel Hals, NP Taking Active            Med Note Vallarie Gauze, [facility] R   Fri Jul 05, 2023 11:52 AM) Taking differently: 5 units TID  Insulin  Pen Needle (PEN NEEDLES) 31G X 8 MM MISC [id number]  Use to inject insulin  4 times daily Wendel Hals, NP  Active   JANUVIA  100 MG tablet [id number] Yes TAKE 1 TABLET BY MOUTH EVERY DAY Wendel Hals, NP Taking Active   lactobacillus  acidophilus (BACID) TABS tablet [id number]  Take 1 tablet by mouth daily. [provider]  Active   meloxicam  (MOBIC ) 7.5 MG tablet [id number]  Take 1 tablet (7.5 mg total) by mouth daily.  Patient not taking: Reported on 08/26/2023   Jerrlyn Morel, NP  Active   metFORMIN  (GLUCOPHAGE ) 500 MG tablet [id number] Yes Take 1 tablet (500 mg total) by mouth 2 (two) times daily with a meal. Wendel Hals, NP Taking Active   montelukast  (SINGULAIR ) 10 MG tablet [id number] Yes TAKE 1 TABLET BY MOUTH DAILY Nichols, Tonya S, NP Taking Active   Multiple Vitamin (MULTIVITAMIN WITH MINERALS) TABS tablet [id number]  Take 1 tablet by mouth daily. [provider]  Active   olmesartan -hydrochlorothiazide (BENICAR  HCT) 40-25 MG tablet [id number] Yes TAKE 1 TABLET BY MOUTH EVERY DAY Nichols, Tonya S, NP Taking Active   pantoprazole  (PROTONIX ) 40 MG tablet [id number] Yes Take 1 tablet (40 mg total) by mouth 2 (two) times daily before a meal. Mansouraty, Albino Alu., MD Taking Active   rizatriptan  (MAXALT -MLT) 10 MG disintegrating tablet [id number]  Take 1 tablet (10 mg total) by mouth as needed for migraine. May repeat in 2 hours if needed Jerrlyn Morel, NP  Active   rosuvastatin  (CRESTOR ) 10 MG tablet [id number] Yes Take 1 tablet (10 mg total) by mouth daily. Jerrlyn Morel, NP Taking Active   terconazole  (TERAZOL 7 ) 0.4 % vaginal cream [id number]  Place 1 applicator vaginally at bedtime. Use for seven days Ajewole, Christana, MD  Active   TURMERIC PO [id number]  Take 1 capsule by mouth daily. She does not know the strength [provider]  Active   Med List Note Gregoria Leas, [place] 12/23/19 1206):                Assessment/Plan:   Diabetes: - Currently uncontrolled with last A1C of 9.3%, but improved recently with GMI over the past 7 days 7.9%. Patient continues to have BG elevation primarily after dinner - which may be exacerbated by gastroparesis and intake of dessert after  dinner (which she does try to limit). She is currently only taking 5 units of Humalog  with meals because she felt that higher doses of meal-time insulin  did not impact her BG. Discussed need for higher doses of insulin  due to  insulin  resistance with patient and she expressed understanding. She is amenable to increasing dose of Humalog  with dinner. She is currently taking Lantus  80 units daily, which is the maximum dose the pen allows. Proposed switching to concentrated insulin  Toujeo  U300. Expect lower-volume insulin  injection will be better absorbed. Patient cannot take GLP-1RA or GLP-1/GIP RA due to gastroparesis.  - Reviewed long term cardiovascular and renal outcomes of uncontrolled blood sugar - Reviewed goal A1c, goal fasting, and goal 2 hour post prandial glucose - Reviewed dietary modifications including controlling portion size of carbohydrate heavy foods. Commended for excellent adherence to healthy diet.  - Reviewed lifestyle modifications including: continuing regular physical activity. Congratulated on progress with walking 2.5 miles daily.  - Recommend to continue Jardiance  25 mg daily, Januvia  100 mg daily, metformin  IR 500 mg BID  - Recommend to stop Lantus  80 units nightly and start Toujeo  U300 80 units nightly. Toujeo  U300 Max Solostar is 900 units per pen, max dose 160 units). Ran test claim and enrolled in coupon card - expected copay $35. Patient is aware and agreeable to cost.  - Recommend to continue Humalog  5 units with breakfast and lunch. Recommend to increase Humalog  to 8 units with dinner and titrating by 2 units every 3 days up to a maximum of 20 units. - Patient denies personal or family history of multiple endocrine neoplasia type 2, medullary thyroid cancer; personal history of pancreatitis or gallbladder disease. - Recommend to check glucose continuously with FL3+ CGM.    Hypertension: - Currently controlled - Reviewed long term cardiovascular and renal outcomes of  uncontrolled blood pressure - Recommend to continue olmesartan -hydrochlorothiazide 40-25 mg daily    Hyperlipidemia/ASCVD Risk Reduction: - Currently uncontrolled with last LDL-C of 116 mg/dL above goal < 70 mg/dL given W2NF with comorbidities in the setting of being off statin therapy for ~3 years. Patient recently restarted moderate intensity statin. Will repeat lipid panel in 6-12 weeks.  - Reviewed long term complications of uncontrolled cholesterol - Recommend to continue rosuvastatin  10 mg daily  - Repeat lipid panel at PCP appt on 10/28/23   Follow Up Plan: PCP 10/28/23, Pharmacist telephone 11/25/23, Endo 11/27/23  Arthea Larsson, PharmD PGY1 Pharmacy Resident

## 2023-09-30 NOTE — Progress Notes (Unsigned)
 Enrolled in $35 coupon card for Toujeo  U300. Shared information with patient's preferred pharmacy - [facility].    Arthea Larsson, PharmD PGY1 Pharmacy Resident

## 2023-10-01 ENCOUNTER — Other Ambulatory Visit: Payer: Self-pay | Admitting: Nurse Practitioner

## 2023-10-01 ENCOUNTER — Telehealth: Payer: Self-pay

## 2023-10-01 ENCOUNTER — Other Ambulatory Visit: Payer: Self-pay | Admitting: Gastroenterology

## 2023-10-01 DIAGNOSIS — M79622 Pain in left upper arm: Secondary | ICD-10-CM

## 2023-10-01 MED ORDER — TOUJEO MAX SOLOSTAR 300 UNIT/ML ~~LOC~~ SOPN: 80.0000 [IU] | PEN_INJECTOR | Freq: Every day | SUBCUTANEOUS | 3 refills | Status: DC

## 2023-10-01 MED ORDER — MELOXICAM 15 MG PO TABS: 15.0000 mg | ORAL_TABLET | Freq: Every day | ORAL | 0 refills | Status: DC | PRN

## 2023-10-01 NOTE — Telephone Encounter (Signed)
 Copied from CRM [id number]. Topic: Clinical - Prescription Issue >> Oct 01, 2023 10:50 AM Rosaria Common wrote: Reason for CRM: Joy from [facility] calling on behalf of pt requesting her meloxicam  (MOBIC ) 7.5 MG tablet is increased to 15 MG. Pt's callback number is [phone], and pharmacy callback number is [phone].  Please advise in [place] absence. Thank you. KH

## 2023-10-17 ENCOUNTER — Other Ambulatory Visit: Payer: Self-pay | Admitting: Nurse Practitioner

## 2023-10-17 DIAGNOSIS — G4700 Insomnia, unspecified: Secondary | ICD-10-CM

## 2023-10-17 NOTE — Telephone Encounter (Signed)
 Please advise [facility]

## 2023-10-22 ENCOUNTER — Other Ambulatory Visit: Payer: Self-pay | Admitting: Nurse Practitioner

## 2023-10-22 DIAGNOSIS — M79622 Pain in left upper arm: Secondary | ICD-10-CM

## 2023-10-22 DIAGNOSIS — J4530 Mild persistent asthma, uncomplicated: Secondary | ICD-10-CM

## 2023-10-28 ENCOUNTER — Ambulatory Visit (INDEPENDENT_AMBULATORY_CARE_PROVIDER_SITE_OTHER): Payer: Self-pay | Admitting: Nurse Practitioner

## 2023-10-28 ENCOUNTER — Encounter: Payer: Self-pay | Admitting: Nurse Practitioner

## 2023-10-28 ENCOUNTER — Other Ambulatory Visit: Payer: Self-pay | Admitting: Nurse Practitioner

## 2023-10-28 VITALS — BP 100/68 | HR 80 | Temp 98.8°F | Wt 267.4 lb

## 2023-10-28 DIAGNOSIS — K219 Gastro-esophageal reflux disease without esophagitis: Secondary | ICD-10-CM

## 2023-10-28 DIAGNOSIS — Z13220 Encounter for screening for lipoid disorders: Secondary | ICD-10-CM

## 2023-10-28 DIAGNOSIS — Z794 Long term (current) use of insulin: Secondary | ICD-10-CM | POA: Diagnosis not present

## 2023-10-28 DIAGNOSIS — E1165 Type 2 diabetes mellitus with hyperglycemia: Secondary | ICD-10-CM

## 2023-10-28 DIAGNOSIS — G4700 Insomnia, unspecified: Secondary | ICD-10-CM

## 2023-10-28 DIAGNOSIS — J329 Chronic sinusitis, unspecified: Secondary | ICD-10-CM

## 2023-10-28 MED ORDER — FAMOTIDINE 20 MG PO TABS: 20.0000 mg | ORAL_TABLET | Freq: Two times a day (BID) | ORAL | 2 refills | Status: DC

## 2023-10-28 MED ORDER — SIMETHICONE 180 MG PO CAPS: 1.0000 | ORAL_CAPSULE | Freq: Three times a day (TID) | ORAL | 0 refills | Status: DC

## 2023-10-28 NOTE — Telephone Encounter (Signed)
 Please advise [facility]

## 2023-10-28 NOTE — Progress Notes (Signed)
 Subjective   Patient ID: Lori Shea, female    DOB: Apr 17, 1976, 48 y.o.   MRN: [id number]  Chief Complaint  Patient presents with   Medical Management of Chronic Issues    Patient stated that she has been having a lot of gas    Referring provider: Oley Bascom RAMAN, NP  [name] is a 48 y.o. female with Past Medical History: No date: Allergy     Comment:  Foods:  mango, honey, corn.  Allergic to everything               tested  15 years ago in [place] No date: Anemia     Comment:  with pregnancy No date: Anxiety No date: Arthritis     Comment:  Shoulder and hips age 62 yo: Asthma     Comment:  Triggers:  cold weather, exercise, allergens, anxiety,               URIs age 50 yo: Cholelithiasis     Comment:  Asymptomatic 12/27/2016: Closed avulsion fracture of lateral malleolus of right  fibula No date: COVID-19 2012: Diabetes mellitus type 2     Comment:  Was diagnosed 2 years earlier with PCOS 09/13/2016: Elevated liver enzymes     Comment:  none since 2018 No date: GERD (gastroesophageal reflux disease) No date: Heart murmur     Comment:  with pregnancy 20 yrs ago per pt on 10-12-2021 No date: History of kidney stones 2015: Hypertension No date: Kidney stones     Comment:  family history of renal failure 2009: Migraines No date: Morbid obesity (HCC) 2010: PCOS (polycystic ovarian syndrome) No date: Pneumonia     Comment:  several years ago No date: Wears glasses   HPI  Diabetes Mellitus: Patient presents for follow up of diabetes. Patient is followed by endocrinology. Symptoms: none. Patient denies any symptoms. Patient denies foot ulcerations, hypoglycemia , and nausea.  Evaluation to date has been included: hemoglobin A1C. Treatment to date: no recent interventions. Will continue to follow with endocrinology. A1C in October was 9.3. she is followed by endocrinology.  Patient is experiencing a lot of gas.  We will try simethicone .  Denies f/c/s,  n/v/d, hemoptysis, PND, leg swelling Denies chest pain or edema      Allergies  Allergen Reactions   Corn-Containing Products Hives    really bad asthma attacks   Honey Shortness Of Breath   Mango Flavoring Agent (Non-Screening) Hives and Swelling    Pt allergic to all mango - tongue and throat swelling   Glipizide      Other reaction(s): Other bad yeast infection; she feels she can take with probiotics   Liraglutide Nausea And Vomiting    victoza   Semaglutide  Nausea Only    gastroparesis    Immunization History  Administered Date(s) Administered   Pneumococcal Polysaccharide-23 12/18/2018   Pneumococcal-Unspecified 04/23/1997   Td 09/06/2004   Tdap 09/13/2016    Tobacco History: Social History   Tobacco Use  Smoking Status Never  Smokeless Tobacco Never   Counseling given: Not Answered   Outpatient Encounter Medications as of 10/28/2023  Medication Sig   albuterol  (PROVENTIL ) (2.5 MG/3ML) 0.083% nebulizer solution USE 1 VIAL VIA NEBULIZER EVERY 6 HOURS AS NEEDED FOR WHEEZING OR SHORTNESS OF BREATH   albuterol  (VENTOLIN  HFA) 108 (90 Base) MCG/ACT inhaler INHALE 2 PUFFS BY MOUTH INTO THE LUNGS EVERY 4 HOURS AS NEEDED FOR WHEEZING OR SHORTNESS OF BREATH   amitriptyline  (ELAVIL ) 25 MG  tablet TAKE 1 TABLET BY MOUTH AT BEDTIME   Bacillus Coagulans-Inulin (PROBIOTIC-PREBIOTIC PO) Take 1 capsule by mouth daily.   cetirizine  (ZYRTEC ) 10 MG tablet TAKE 1 TABLET BY MOUTH EVERY DAY   cholecalciferol (VITAMIN D3) 25 MCG (1000 UNIT) tablet Take 1,000 Units by mouth daily.   cholestyramine  (QUESTRAN ) 4 g packet Take 1 packet (4 g total) by mouth 2 (two) times daily.   Continuous Glucose Sensor (FREESTYLE LIBRE 3 PLUS SENSOR) MISC Change sensor every 15 days.   ELDERBERRY PO Take 1 tablet by mouth daily. She does not  know the strength   empagliflozin  (JARDIANCE ) 25 MG TABS tablet Take 1 tablet (25 mg total) by mouth daily.   famotidine  (PEPCID ) 20 MG tablet Take 1 tablet (20 mg  total) by mouth 2 (two) times daily.   fluticasone -salmeterol (ADVAIR DISKUS) 100-50 MCG/ACT AEPB Inhale 1 puff into the lungs 2 (two) times daily.   insulin  glargine, 2 Unit Dial , (TOUJEO  MAX SOLOSTAR) 300 UNIT/ML Solostar Pen Inject 80 Units into the skin daily. May titrate up to 90 units daily if instructed by your provider.   insulin  lispro (HUMALOG ) 100 UNIT/ML KwikPen INJECT 16-22 UNITS into THE SKIN 3 TIMES DAILY BEFORE MEALS   Insulin  Pen Needle (PEN NEEDLES) 31G X 8 MM MISC Use to inject insulin  4 times daily   JANUVIA  100 MG tablet TAKE 1 TABLET BY MOUTH EVERY DAY   lactobacillus acidophilus (BACID) TABS tablet Take 1 tablet by mouth daily.   meloxicam  (MOBIC ) 15 MG tablet Take 1 tablet (15 mg total) by mouth daily as needed for pain.   metFORMIN  (GLUCOPHAGE ) 500 MG tablet Take 1 tablet (500 mg total) by mouth 2 (two) times daily with a meal.   montelukast  (SINGULAIR ) 10 MG tablet TAKE 1 TABLET BY MOUTH EVERY DAY   Multiple Vitamin (MULTIVITAMIN WITH MINERALS) TABS tablet Take 1 tablet by mouth daily.   olmesartan -hydrochlorothiazide (BENICAR  HCT) 40-25 MG tablet TAKE 1 TABLET BY MOUTH EVERY DAY   pantoprazole  (PROTONIX ) 40 MG tablet TAKE 1 TABLET BY MOUTH 2 TIMES DAILY BEFORE A meal   rizatriptan  (MAXALT -MLT) 10 MG disintegrating tablet Take 1 tablet (10 mg total) by mouth as needed for migraine. May repeat in 2 hours if needed   rosuvastatin  (CRESTOR ) 10 MG tablet Take 1 tablet (10 mg total) by mouth daily.   Simethicone  180 MG CAPS Take 1 capsule (180 mg total) by mouth 4 (four) times daily - after meals and at bedtime.   terconazole  (TERAZOL 7 ) 0.4 % vaginal cream Place 1 applicator vaginally at bedtime. Use for seven days   TURMERIC PO Take 1 capsule by mouth daily. She does not know the strength   No facility-administered encounter medications on file as of 10/28/2023.    Review of Systems  Review of Systems  Constitutional: Negative.   HENT: Negative.    Cardiovascular:  Negative.   Gastrointestinal: Negative.   Allergic/Immunologic: Negative.   Neurological: Negative.   Psychiatric/Behavioral: Negative.       Objective:   BP 100/68   Pulse 80   Temp 98.8 F (37.1 C) (Oral)   Wt 267 lb 6.4 oz (121.3 kg)   LMP 10/14/2021 (Exact Date)   SpO2 99%   BMI 43.16 kg/m   Wt Readings from Last 5 Encounters:  10/28/23 267 lb 6.4 oz (121.3 kg)  08/26/23 267 lb (121.1 kg)  07/26/23 273 lb 9.6 oz (124.1 kg)  07/25/23 273 lb 3.2 oz (123.9 kg)  04/11/23 282 lb (127.9  kg)     Physical Exam Vitals and nursing note reviewed.  Constitutional:      General: She is not in acute distress.    Appearance: She is well-developed.  Cardiovascular:     Rate and Rhythm: Normal rate and regular rhythm.  Pulmonary:     Effort: Pulmonary effort is normal.     Breath sounds: Normal breath sounds.  Neurological:     Mental Status: She is alert and oriented to person, place, and time.       Assessment & Plan:   Lipid screening -     Lipid panel  Type 2 diabetes mellitus with hyperglycemia, with long-term current use of insulin  (HCC) -     Hemoglobin A1c  Gastroesophageal reflux disease without esophagitis -     Famotidine ; Take 1 tablet (20 mg total) by mouth 2 (two) times daily.  Dispense: 60 tablet; Refill: 2 -     Simethicone ; Take 1 capsule (180 mg total) by mouth 4 (four) times daily - after meals and at bedtime.  Dispense: 60 capsule; Refill: 0     Return in about 3 months (around 01/28/2024).   Bascom GORMAN Borer, NP 10/28/2023

## 2023-10-28 NOTE — Patient Instructions (Signed)
 1. Lipid screening (Primary)  - Lipid Panel   2. Type 2 diabetes mellitus with hyperglycemia, with long-term current use of insulin  (HCC)  - Hemoglobin A1c   3. Gastroesophageal reflux disease without esophagitis   - famotidine  (PEPCID ) 20 MG tablet; Take 1 tablet (20 mg total) by mouth 2 (two) times daily.  Dispense: 60 tablet; Refill: 2 - Simethicone  180 MG CAPS; Take 1 capsule (180 mg total) by mouth 4 (four) times daily - after meals and at bedtime.  Dispense: 60 capsule; Refill: 0

## 2023-10-29 LAB — LIPID PANEL
Chol/HDL Ratio: 3.2 ratio (ref 0.0–4.4)
Cholesterol, Total: 113 mg/dL (ref 100–199)
HDL: 35 mg/dL — ABNORMAL LOW (ref >39)
LDL Chol Calc (NIH): 40 mg/dL (ref 0–99)
Triglycerides: 244 mg/dL — ABNORMAL HIGH (ref 0–149)
VLDL Cholesterol Cal: 38 mg/dL (ref 5–40)

## 2023-10-29 LAB — HEMOGLOBIN A1C
Est. average glucose Bld gHb Est-mCnc: 200 mg/dL
Hgb A1c MFr Bld: 8.6 % — ABNORMAL HIGH (ref 4.8–5.6)

## 2023-10-30 ENCOUNTER — Ambulatory Visit: Payer: Self-pay | Admitting: Nurse Practitioner

## 2023-11-12 ENCOUNTER — Telehealth: Payer: Self-pay

## 2023-11-12 ENCOUNTER — Other Ambulatory Visit (HOSPITAL_COMMUNITY): Payer: Self-pay

## 2023-11-12 NOTE — Telephone Encounter (Signed)
 Pharmacy Patient Advocate Encounter   Received notification from CoverMyMeds that prior authorization for Jardiance  25mg  is required/requested.   Insurance verification completed.   The patient is insured through [facility] .   Per test claim: Refill too soon. PA is not needed at this time. Medication was filled 10/17/23. Next eligible fill date is 11/13/23.

## 2023-11-17 ENCOUNTER — Encounter (HOSPITAL_COMMUNITY): Payer: Self-pay

## 2023-11-17 ENCOUNTER — Ambulatory Visit (HOSPITAL_COMMUNITY)
Admission: RE | Admit: 2023-11-17 | Discharge: 2023-11-17 | Disposition: A | Discharge status: Home / Self Care | Source: Ambulatory Visit | Attending: Internal Medicine | Admitting: Internal Medicine

## 2023-11-17 VITALS — BP 116/79 | HR 81 | Temp 98.4°F | Resp 17 | Ht 65.0 in | Wt 270.0 lb

## 2023-11-17 DIAGNOSIS — J028 Acute pharyngitis due to other specified organisms: Secondary | ICD-10-CM | POA: Diagnosis not present

## 2023-11-17 LAB — POCT RAPID STREP A (OFFICE): Rapid Strep A Screen: NEGATIVE

## 2023-11-17 MED ORDER — METHYLPREDNISOLONE SODIUM SUCC 125 MG IJ SOLR
INTRAMUSCULAR | Status: AC
  Filled 2023-11-17: qty 2

## 2023-11-17 MED ORDER — AZITHROMYCIN 250 MG PO TABS
ORAL_TABLET | ORAL | 0 refills | Status: DC
Start: 2023-11-17 — End: 2023-11-23

## 2023-11-17 MED ORDER — METHYLPREDNISOLONE SODIUM SUCC 125 MG IJ SOLR
80.0000 mg | Freq: Once | INTRAMUSCULAR | Status: AC
  Administered 2023-11-17: 80 mg via INTRAMUSCULAR

## 2023-11-17 NOTE — ED Provider Notes (Signed)
 [facility]    CSN: [id number] Arrival date & time: 11/17/23  1622      History   Chief Complaint Chief Complaint  Patient presents with   Sore Throat    I've had a very sore throat for 3days,my ear hurts as well. - Entered by patient    HPI Lori Shea is a 48 y.o. female.   48 y.o. female who presents to urgent care with complaints of with complaints of sore throat. This has been going on for 5 days. It is very difficult to swallow due to the pain.  She denies any fevers or chills.  She did wake up today with significant nasal congestion.  She denies any shortness of breath or chest pain but has noticed an increase in her asthma symptoms and having to use her inhaler more frequently.   Sore Throat Pertinent negatives include no chest pain, no abdominal pain and no shortness of breath.    Past Medical History:  Diagnosis Date   Allergy    Foods:  mango, honey, corn.  Allergic to everything tested  15 years ago in [place]   Anemia    with pregnancy   Anxiety    Arthritis    Shoulder and hips   Asthma age 89 yo   Triggers:  cold weather, exercise, allergens, anxiety, URIs   Cholelithiasis age 8 yo   Asymptomatic   Closed avulsion fracture of lateral malleolus of right fibula 12/27/2016   COVID-19    Diabetes mellitus type 2 2012   Was diagnosed 2 years earlier with PCOS   Elevated liver enzymes 09/13/2016   none since 2018   GERD (gastroesophageal reflux disease)    Heart murmur    with pregnancy 20 yrs ago per pt on 10-12-2021   History of kidney stones    Hypertension 2015   Kidney stones    family history of renal failure   Migraines 2009   Morbid obesity (HCC)    PCOS (polycystic ovarian syndrome) 2010   Pneumonia    several years ago   Wears glasses     Patient Active Problem List   Diagnosis Date Noted   Left upper arm pain 12/10/2022   Soft tissue infection 12/10/2022   UTI symptoms 12/10/2022   Chronic diarrhea 01/25/2022    History of colonic polyps 01/25/2022   Gastroesophageal reflux disease 01/25/2022   Chronic nausea 01/25/2022   Gastritis without bleeding 01/25/2022   Stress incontinence of urine 01/02/2021   Skin irritation 01/02/2021   Hepatic steatosis 12/01/2019   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/2019   Hemoglobin A1C greater than 9%, indicating poor diabetic control 03/23/2019   Hyperglycemia 03/23/2019   Class 3 severe obesity due to excess calories with serious comorbidity and body mass index (BMI) of 40.0 to 44.9 in adult 03/23/2019   Yeast infection 03/23/2019   BMI 40.0-44.9, adult (HCC) 03/09/2019   Cervical dysplasia 07/31/2017   Alopecia of scalp 05/09/2017   Pain in left toe(s) 12/27/2016   Morbid obesity (HCC) 09/13/2016   Elevated liver enzymes 09/13/2016   Allergy    Asthma    Hypertension 04/23/2013   Irregular periods 02/22/2011   Type 2 diabetes mellitus with hyperglycemia, with long-term current use of insulin  (HCC) 04/23/2010   PCOS (polycystic ovarian syndrome) 04/23/2008   Migraines 04/24/2007    Past Surgical History:  Procedure Laterality Date   CHOLECYSTECTOMY N/A 12/01/2019   Procedure: LAPAROSCOPIC CHOLECYSTECTOMY;  Surgeon: Eletha Boas, MD;  Location: [facility];  Service: General;  Laterality: N/A;   colonscopy and endoscopy  05/25/2021   COLPOSCOPY N/A 2021   CYST EXCISION     DILITATION & CURRETTAGE/HYSTROSCOPY WITH NOVASURE ABLATION N/A 10/18/2021   Procedure: DILATATION & CURETTAGE/HYSTEROSCOPY WITH NOVASURE ABLATION;  Surgeon: Izell Harari, MD;  Location: [facility];  Service: Gynecology;  Laterality: N/A;   LAPAROSCOPIC TUBAL LIGATION Bilateral 01/28/2019   Procedure: LAPAROSCOPIC TUBAL LIGATION AND PAP SMEAR;  Surgeon: Izell Harari, MD;  Location: [facility];  Service: Gynecology;  Laterality: Bilateral;   TONSILLECTOMY AND ADENOIDECTOMY Bilateral 12/23/2018   Procedure: TONSILLECTOMY AND ADENOIDECTOMY;   Surgeon: Karis Clunes, MD;  Location: [facility];  Service: ENT;  Laterality: Bilateral;    OB History     Gravida  4   Para  1   Term  1   Preterm  0   AB  3   Living  1      SAB  3   IAB  0   Ectopic  0   Multiple  0   Live Births  1            Home Medications    Prior to Admission medications   Medication Sig Start Date End Date Taking? Authorizing Provider  albuterol  (PROVENTIL ) (2.5 MG/3ML) 0.083% nebulizer solution USE 1 VIAL VIA NEBULIZER EVERY 6 HOURS AS NEEDED FOR WHEEZING OR SHORTNESS OF BREATH 01/23/23  Yes Oley Bascom RAMAN, NP  albuterol  (VENTOLIN  HFA) 108 (90 Base) MCG/ACT inhaler INHALE 2 PUFFS BY MOUTH INTO THE LUNGS EVERY 4 HOURS AS NEEDED FOR WHEEZING OR SHORTNESS OF BREATH 10/01/23  Yes Oley Bascom RAMAN, NP  amitriptyline  (ELAVIL ) 25 MG tablet TAKE 1 TABLET BY MOUTH AT BEDTIME 10/28/23  Yes Nichols, Tonya S, NP  azithromycin  (ZITHROMAX ) 250 MG tablet Take first 2 tablets together, then 1 every day until finished. 11/17/23  Yes Alvia Tory A, PA-C  Bacillus Coagulans-Inulin (PROBIOTIC-PREBIOTIC PO) Take 1 capsule by mouth daily.   Yes [provider]  cetirizine  (ZYRTEC ) 10 MG tablet TAKE 1 TABLET BY MOUTH EVERY DAY 10/28/23  Yes Nichols, Tonya S, NP  cholecalciferol (VITAMIN D3) 25 MCG (1000 UNIT) tablet Take 1,000 Units by mouth daily.   Yes [provider]  cholestyramine  (QUESTRAN ) 4 g packet Take 1 packet (4 g total) by mouth 2 (two) times daily. 10/02/22  Yes Mansouraty, Aloha Raddle., MD  Continuous Glucose Sensor (FREESTYLE LIBRE 3 PLUS SENSOR) MISC Change sensor every 15 days. 08/21/23  Yes Reardon, Benton PARAS, NP  ELDERBERRY PO Take 1 tablet by mouth daily. She does not  know the strength   Yes [provider]  empagliflozin  (JARDIANCE ) 25 MG TABS tablet Take 1 tablet (25 mg total) by mouth daily. 08/21/23  Yes Therisa Benton PARAS, NP  famotidine  (PEPCID ) 20 MG tablet Take 1 tablet (20 mg total) by mouth 2  (two) times daily. 10/28/23  Yes Oley Bascom RAMAN, NP  fluticasone -salmeterol (ADVAIR DISKUS) 100-50 MCG/ACT AEPB Inhale 1 puff into the lungs 2 (two) times daily. 10/04/22  Yes Oley Bascom RAMAN, NP  insulin  glargine, 2 Unit Dial , (TOUJEO  MAX SOLOSTAR) 300 UNIT/ML Solostar Pen Inject 80 Units into the skin daily. May titrate up to 90 units daily if instructed by your provider. 10/01/23  Yes Paseda, Folashade R, FNP  insulin  lispro (HUMALOG ) 100 UNIT/ML KwikPen INJECT 16-22 UNITS into THE SKIN 3 TIMES DAILY BEFORE MEALS 06/04/23  Yes Therisa Benton PARAS, NP  Insulin  Pen Needle (  PEN NEEDLES) 31G X 8 MM MISC Use to inject insulin  4 times daily 02/06/23  Yes Reardon, Benton PARAS, NP  JANUVIA  100 MG tablet TAKE 1 TABLET BY MOUTH EVERY DAY 09/23/23  Yes Therisa Benton PARAS, NP  lactobacillus acidophilus (BACID) TABS tablet Take 1 tablet by mouth daily.   Yes [provider]  meloxicam  (MOBIC ) 15 MG tablet Take 1 tablet (15 mg total) by mouth daily as needed for pain. 10/27/23  Yes Oley Bascom RAMAN, NP  metFORMIN  (GLUCOPHAGE ) 500 MG tablet Take 1 tablet (500 mg total) by mouth 2 (two) times daily with a meal. 02/06/23  Yes Therisa Benton PARAS, NP  montelukast  (SINGULAIR ) 10 MG tablet TAKE 1 TABLET BY MOUTH EVERY DAY 10/22/23  Yes Nichols, Tonya S, NP  Multiple Vitamin (MULTIVITAMIN WITH MINERALS) TABS tablet Take 1 tablet by mouth daily.   Yes [provider]  olmesartan -hydrochlorothiazide (BENICAR  HCT) 40-25 MG tablet TAKE 1 TABLET BY MOUTH EVERY DAY 09/06/23  Yes Nichols, Tonya S, NP  pantoprazole  (PROTONIX ) 40 MG tablet TAKE 1 TABLET BY MOUTH 2 TIMES DAILY BEFORE A meal 10/01/23  Yes Mansouraty, Aloha Raddle., MD  rizatriptan  (MAXALT -MLT) 10 MG disintegrating tablet Take 1 tablet (10 mg total) by mouth as needed for migraine. May repeat in 2 hours if needed 11/23/22  Yes Oley Bascom RAMAN, NP  rosuvastatin  (CRESTOR ) 10 MG tablet Take 1 tablet (10 mg total) by mouth daily. 07/26/23  Yes Oley Bascom RAMAN, NP   Simethicone  180 MG CAPS Take 1 capsule (180 mg total) by mouth 4 (four) times daily - after meals and at bedtime. 10/28/23  Yes Oley Bascom RAMAN, NP  terconazole  (TERAZOL 7 ) 0.4 % vaginal cream Place 1 applicator vaginally at bedtime. Use for seven days 09/02/23  Yes Ajewole, Christana, MD  TURMERIC PO Take 1 capsule by mouth daily. She does not know the strength   Yes [provider]    Family History Family History  Problem Relation Age of Onset   Healthy Mother    Diabetes Father    Hyperlipidemia Father    Hypertension Father    Heart disease Father    Kidney disease Father        Developed after 2nd CABG   Colon polyps Father    Diabetes Paternal Grandfather    Heart disease Paternal Grandfather    Hyperlipidemia Paternal Grandfather    Hypertension Paternal Grandfather    Kidney disease Paternal Grandfather     Social History Social History   Tobacco Use   Smoking status: Never   Smokeless tobacco: Never  Vaping Use   Vaping status: Never Used  Substance Use Topics   Alcohol use: No   Drug use: No     Allergies   Corn-containing products, Honey, Mango flavoring agent (non-screening), Glipizide , Liraglutide, and Semaglutide    Review of Systems Review of Systems  Constitutional:  Negative for chills and fever.  HENT:  Positive for congestion, sore throat and trouble swallowing. Negative for ear pain.   Eyes:  Negative for pain and visual disturbance.  Respiratory:  Negative for cough and shortness of breath.   Cardiovascular:  Negative for chest pain and palpitations.  Gastrointestinal:  Negative for abdominal pain and vomiting.  Genitourinary:  Negative for dysuria and hematuria.  Musculoskeletal:  Negative for arthralgias and back pain.  Skin:  Negative for color change and rash.  Neurological:  Negative for seizures and syncope.  All other systems reviewed and are negative.    Physical Exam  Triage Vital Signs ED Triage Vitals  Encounter  Vitals Group     BP 11/17/23 1639 116/79     Girls Systolic BP Percentile --      Girls Diastolic BP Percentile --      Boys Systolic BP Percentile --      Boys Diastolic BP Percentile --      Pulse Rate 11/17/23 1639 81     Resp 11/17/23 1639 17     Temp 11/17/23 1639 98.4 F (36.9 C)     Temp Source 11/17/23 1639 Oral     SpO2 11/17/23 1639 95 %     Weight 11/17/23 1638 270 lb (122.5 kg)     Height 11/17/23 1638 5' 5 (1.651 m)     Head Circumference --      Peak Flow --      Pain Score 11/17/23 1638 8     Pain Loc --      Pain Education --      Exclude from Growth Chart --    No data found.  Updated Vital Signs BP 116/79 (BP Location: Left Arm)   Pulse 81   Temp 98.4 F (36.9 C) (Oral)   Resp 17   Ht 5' 5 (1.651 m)   Wt 270 lb (122.5 kg)   LMP 10/14/2021 (Exact Date)   SpO2 95%   BMI 44.93 kg/m   Visual Acuity Right Eye Distance:   Left Eye Distance:   Bilateral Distance:    Right Eye Near:   Left Eye Near:    Bilateral Near:     Physical Exam Vitals and nursing note reviewed.  Constitutional:      General: She is not in acute distress.    Appearance: She is well-developed.  HENT:     Head: Normocephalic and atraumatic.     Right Ear: Tympanic membrane normal.     Left Ear: Tympanic membrane normal.     Nose: Congestion present.     Mouth/Throat:     Mouth: Mucous membranes are moist.     Pharynx: Posterior oropharyngeal erythema present. No pharyngeal swelling or uvula swelling.  Eyes:     Conjunctiva/sclera: Conjunctivae normal.  Cardiovascular:     Rate and Rhythm: Normal rate and regular rhythm.     Heart sounds: No murmur heard. Pulmonary:     Effort: Pulmonary effort is normal. No respiratory distress.     Breath sounds: Normal breath sounds.  Abdominal:     Palpations: Abdomen is soft.     Tenderness: There is no abdominal tenderness.  Musculoskeletal:        General: No swelling.     Cervical back: Neck supple.  Skin:    General:  Skin is warm and dry.     Capillary Refill: Capillary refill takes less than 2 seconds.  Neurological:     Mental Status: She is alert.  Psychiatric:        Mood and Affect: Mood normal.      UC Treatments / Results  Labs (all labs ordered are listed, but only abnormal results are displayed) Labs Reviewed  POCT RAPID STREP A (OFFICE)    EKG   Radiology No results found.  Procedures Procedures (including critical care time)  Medications Ordered in UC Medications  methylPREDNISolone  sodium succinate (SOLU-MEDROL ) 125 mg/2 mL injection 80 mg (has no administration in time range)    Initial Impression / Assessment and Plan / UC Course  I have reviewed the triage vital signs  and the nursing notes.  Pertinent labs & imaging results that were available during my care of the patient were reviewed by me and considered in my medical decision making (see chart for details).     Acute pharyngitis due to other specified organisms   Strep testing done today was negative for strep. Symptoms and physical exam findings are consistent with acute pharyngitis. Due to the duration of the symptoms, we will treat with the following:  Medrol  injection given today. This is a steroid to help with pain, inflammation.  Azithromycin  250mg  Take 2 tablets today and the 1 tablet daily for 4 more days. May use ibuprofen  600-800 mg every 6-8 hours as needed for pain.  Rest and stay hydrated.  Return to urgent care or PCP if symptoms worsen or fail to resolve.    Final Clinical Impressions(s) / UC Diagnoses   Final diagnoses:  Acute pharyngitis due to other specified organisms     Discharge Instructions      Strep testing done today was negative for strep. Symptoms and physical exam findings are consistent with acute pharyngitis. Due to the duration of the symptoms, we will treat with the following:  Medrol  injection given today. This is a steroid to help with pain, inflammation.   Azithromycin  250mg  Take 2 tablets today and the 1 tablet daily for 4 more days. May use ibuprofen  600-800 mg every 6-8 hours as needed for pain.  Rest and stay hydrated.  Return to urgent care or PCP if symptoms worsen or fail to resolve.     ED Prescriptions     Medication Sig Dispense Auth. Provider   azithromycin  (ZITHROMAX ) 250 MG tablet Take first 2 tablets together, then 1 every day until finished. 6 tablet Teresa Almarie LABOR, PA-C      PDMP not reviewed this encounter.   Teresa Almarie LABOR, [place] 11/17/23 1809

## 2023-11-17 NOTE — Discharge Instructions (Addendum)
 Strep testing done today was negative for strep. Symptoms and physical exam findings are consistent with acute pharyngitis. Due to the duration of the symptoms, we will treat with the following:  Medrol  injection given today. This is a steroid to help with pain, inflammation.  Azithromycin  250mg  Take 2 tablets today and the 1 tablet daily for 4 more days. May use ibuprofen  600-800 mg every 6-8 hours as needed for pain.  Rest and stay hydrated.  Return to urgent care or PCP if symptoms worsen or fail to resolve.

## 2023-11-17 NOTE — ED Triage Notes (Signed)
 Pt states that she has a sore throat x4-5 days  Pt states that she has a headache, right ear pain and nasal congestion.

## 2023-11-19 ENCOUNTER — Encounter: Payer: Self-pay | Admitting: Physician Assistant

## 2023-11-19 ENCOUNTER — Ambulatory Visit (INDEPENDENT_AMBULATORY_CARE_PROVIDER_SITE_OTHER): Admitting: Physician Assistant

## 2023-11-19 VITALS — BP 132/84

## 2023-11-19 DIAGNOSIS — D2322 Other benign neoplasm of skin of left ear and external auricular canal: Secondary | ICD-10-CM

## 2023-11-19 DIAGNOSIS — D239 Other benign neoplasm of skin, unspecified: Secondary | ICD-10-CM

## 2023-11-19 DIAGNOSIS — L910 Hypertrophic scar: Secondary | ICD-10-CM | POA: Diagnosis not present

## 2023-11-19 DIAGNOSIS — D492 Neoplasm of unspecified behavior of bone, soft tissue, and skin: Secondary | ICD-10-CM

## 2023-11-19 NOTE — Patient Instructions (Addendum)
 Call radiology at [phone]      Important Information  Due to recent changes in healthcare laws, you may see results of your pathology and/or laboratory studies on MyChart before the doctors have had a chance to review them. We understand that in some cases there may be results that are confusing or concerning to you. Please understand that not all results are received at the same time and often the doctors may need to interpret multiple results in order to provide you with the best plan of care or course of treatment. Therefore, we ask that you please give us  2 business days to thoroughly review all your results before contacting the office for clarification. Should we see a critical lab result, you will be contacted sooner.   If You Need Anything After Your Visit  If you have any questions or concerns for your doctor, please call our main line at [phone] If no one answers, please leave a voicemail as directed and we will return your call as soon as possible. Messages left after 4 pm will be answered the following business day.   You may also send us  a message via MyChart. We typically respond to MyChart messages within 1-2 business days.  For prescription refills, please ask your pharmacy to contact our office. Our fax number is [phone].  If you have an urgent issue when the clinic is closed that cannot wait until the next business day, you can page your doctor at the number below.    Please note that while we do our best to be available for urgent issues outside of office hours, we are not available 24/7.   If you have an urgent issue and are unable to reach us , you may choose to seek medical care at your doctor's office, retail clinic, urgent care center, or emergency room.  If you have a medical emergency, please immediately call 911 or go to the emergency department. In the event of inclement weather, please call our main line at [phone] for an update on the status of  any delays or closures.  Dermatology Medication Tips: Please keep the boxes that topical medications come in in order to help keep track of the instructions about where and how to use these. Pharmacies typically print the medication instructions only on the boxes and not directly on the medication tubes.   If your medication is too expensive, please contact our office at [phone] or send us  a message through MyChart.   We are unable to tell what your co-pay for medications will be in advance as this is different depending on your insurance coverage. However, we may be able to find a substitute medication at lower cost or fill out paperwork to get insurance to cover a needed medication.   If a prior authorization is required to get your medication covered by your insurance company, please allow us  1-2 business days to complete this process.  Drug prices often vary depending on where the prescription is filled and some pharmacies may offer cheaper prices.  The website www.goodrx.com contains coupons for medications through different pharmacies. The prices here do not account for what the cost may be with help from insurance (it may be cheaper with your insurance), but the website can give you the price if you did not use any insurance.  - You can print the associated coupon and take it with your prescription to the pharmacy.  - You may also stop by our office during regular business hours and  pick up a GoodRx coupon card.  - If you need your prescription sent electronically to a different pharmacy, notify our office through [facility] or by phone at [phone]

## 2023-11-19 NOTE — Progress Notes (Signed)
   New Patient Visit   Subjective  Lori Shea is a 48 y.o. NEW female who presents for the following: Painful palpable nodule on left upper inner arm that has been there for over a year and gets sore at times. Has a history of cyst excision (chest). Denies personal or family history of lipomas. She underwent an Ultrasound 12/19/22 but the sonographer had difficulty finding the lesion. Report read: Negative targeted ultrasound of the left upper extremity soft tissues.   Other concerns:  - Lesion on left ear that has been there for many years.  - Scar on mid chest that was from a cyst excision (other Dermatology practice)  ~ 4 years ago.     The following portions of the chart were reviewed this encounter and updated as appropriate: medications, allergies, medical history  Review of Systems:  No other skin or systemic complaints except as noted in HPI or Assessment and Plan.  Objective  Well appearing patient in no apparent distress; mood and affect are within normal limits.   A focused examination was performed of the following areas: Face, left ear, chest and both arms.   Relevant exam findings are noted in the Assessment and Plan.    Assessment & Plan   CYST VS: LIPOMA VS: OTHER  Exam: tender firm nodule (could be 2 separate or 1 bifurcated lesion) under the subcutaneous tissue.  Treatment Plan: Referral for a repeat ultrasound and plastic surgery for discussion of removal.   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  NEOPLASM OF SOFT TISSUE Left Antecubital Fossa Referral to ultrasound and plastic surgery for removal.  Related Procedures US  SOFT TISSUE UPPER EXTREMITY LIMITED LEFT (NON-VASCULAR) Ambulatory referral to Plastic  Surgery HYPERTROPHIC SCAR    Return if symptoms worsen or fail to improve.  LILLETTE Rollene Gobble, RN, am acting as scribe for [employer], PA-C .   Documentation: I have reviewed the above documentation for accuracy and completeness, and I agree with the above.  Alvera Tourigny K, PA-C

## 2023-11-22 ENCOUNTER — Ambulatory Visit (INDEPENDENT_AMBULATORY_CARE_PROVIDER_SITE_OTHER): Admitting: Gastroenterology

## 2023-11-22 ENCOUNTER — Encounter: Payer: Self-pay | Admitting: Gastroenterology

## 2023-11-22 VITALS — BP 128/74 | HR 82 | Ht 65.0 in | Wt 265.0 lb

## 2023-11-22 DIAGNOSIS — A480 Gas gangrene: Secondary | ICD-10-CM

## 2023-11-22 DIAGNOSIS — R12 Heartburn: Secondary | ICD-10-CM | POA: Diagnosis not present

## 2023-11-22 DIAGNOSIS — R141 Gas pain: Secondary | ICD-10-CM

## 2023-11-22 DIAGNOSIS — R140 Abdominal distension (gaseous): Secondary | ICD-10-CM | POA: Diagnosis not present

## 2023-11-22 DIAGNOSIS — K2960 Other gastritis without bleeding: Secondary | ICD-10-CM

## 2023-11-22 DIAGNOSIS — K3184 Gastroparesis: Secondary | ICD-10-CM

## 2023-11-22 NOTE — Progress Notes (Signed)
 GASTROENTEROLOGY OUTPATIENT CLINIC VISIT   Primary Care Provider Oley Bascom RAMAN, NP [address] [place] [address] [phone]   Patient Profile: Lori Shea is a 48 y.o. female with a pmh significant for diabetes, nephrolithiasis, hypertension, asthma, anxiety, migraines, GERD, status post cholecystectomy, hemorrhagic gastritis, diverticulosis, colon polyps (TA's), reported food allergies, gastroparesis.  The patient presents to the [facility] for an evaluation and management of problem(s) noted below:  Problem List 1. Pyrosis   2. Abdominal gas pain   3. Bloating   4. Gastroparesis   5. Other gastritis without hemorrhage, unspecified chronicity    Discussed the use of AI scribe software for clinical note transcription with the patient, who gave verbal consent to proceed.  History of Present Illness Please see prior notes for full details of HPI.  Interval History Lori Shea is a 48 year old female who presents for followup of previously diagnosed gastroparesis and with persistent gas and bloating.  She experiences persistent gas and bloating, describing the gas as 'horrible' and 'hot coming out,' with a strong odor. This issue is constant and embarrassing. She has been taking probiotics, prebiotics, and acidophilus daily.  These can help the patient but they do not alleviate all of the symptoms.  She used Gas-x with some effectiveness but has never used Phazyme.  She has a history of gastroparesis and previously took semaglutide  but she has discontinued.  She now no longer needs Reglan .  She reports doing well without these medications, experiencing only occasional nausea related to food intake and a transient 'full feeling' that resolves within hours.  She is currently managing her bowel movements with cholestyramine , using it twice a week to avoid constipation.  She finds it effective in settling her stomach when needed.  She is  taking Protonix  twice daily. Though she takes her medication at bedtime rather than before her meal.  Recently was initiated on Pepcid  by her PCP.  She believes the addition of Pepcid  has been helpful. No current nausea and vomiting. Reports occasional nausea related to food intake and transient fullness.   GI Review of Systems Positive as above Negative for dysphagia, odynophagia, melena, hematochezia  Review of Systems General: Denies fevers/chills/weight loss unintentionally Cardiovascular: Denies chest pain Pulmonary: Denies shortness of breath Gastroenterological: See HPI Genitourinary: Denies darkened urine Hematological: Denies easy bruising/bleeding Dermatological: Denies jaundice Psychological: Mood is stable   Medications Current Outpatient Medications  Medication Sig Dispense Refill   albuterol  (PROVENTIL ) (2.5 MG/3ML) 0.083% nebulizer solution USE 1 VIAL VIA NEBULIZER EVERY 6 HOURS AS NEEDED FOR WHEEZING OR SHORTNESS OF BREATH 75 mL 6   albuterol  (VENTOLIN  HFA) 108 (90 Base) MCG/ACT inhaler INHALE 2 PUFFS BY MOUTH INTO THE LUNGS EVERY 4 HOURS AS NEEDED FOR WHEEZING OR SHORTNESS OF BREATH 6.7 g 2   amitriptyline  (ELAVIL ) 25 MG tablet TAKE 1 TABLET BY MOUTH AT BEDTIME 30 tablet 0   azithromycin  (ZITHROMAX ) 250 MG tablet Take first 2 tablets together, then 1 every day until finished. 6 tablet 0   Bacillus Coagulans-Inulin (PROBIOTIC-PREBIOTIC PO) Take 1 capsule by mouth daily.     cetirizine  (ZYRTEC ) 10 MG tablet TAKE 1 TABLET BY MOUTH EVERY DAY 30 tablet 11   cholecalciferol (VITAMIN D3) 25 MCG (1000 UNIT) tablet Take 1,000 Units by mouth daily.     cholestyramine  (QUESTRAN ) 4 g packet Take 1 packet (4 g total) by mouth 2 (two) times daily. 60 packet 0   Continuous Glucose Sensor (FREESTYLE LIBRE  3 PLUS SENSOR) MISC Change sensor every 15 days. 6 each 3   ELDERBERRY PO Take 1 tablet by mouth daily. She does not  know the strength     empagliflozin  (JARDIANCE ) 25 MG TABS tablet  Take 1 tablet (25 mg total) by mouth daily. 90 tablet 1   famotidine  (PEPCID ) 20 MG tablet Take 1 tablet (20 mg total) by mouth 2 (two) times daily. 60 tablet 2   fluticasone -salmeterol (ADVAIR DISKUS) 100-50 MCG/ACT AEPB Inhale 1 puff into the lungs 2 (two) times daily. 60 each 0   insulin  glargine, 2 Unit Dial , (TOUJEO  MAX SOLOSTAR) 300 UNIT/ML Solostar Pen Inject 80 Units into the skin daily. May titrate up to 90 units daily if instructed by your provider. 27 mL 3   insulin  lispro (HUMALOG ) 100 UNIT/ML KwikPen INJECT 16-22 UNITS into THE SKIN 3 TIMES DAILY BEFORE MEALS 30 mL 1   Insulin  Pen Needle (PEN NEEDLES) 31G X 8 MM MISC Use to inject insulin  4 times daily 200 each 3   JANUVIA  100 MG tablet TAKE 1 TABLET BY MOUTH EVERY DAY 90 tablet 1   lactobacillus acidophilus (BACID) TABS tablet Take 1 tablet by mouth daily.     meloxicam  (MOBIC ) 15 MG tablet Take 1 tablet (15 mg total) by mouth daily as needed for pain. 30 tablet 0   metFORMIN  (GLUCOPHAGE ) 500 MG tablet Take 1 tablet (500 mg total) by mouth 2 (two) times daily with a meal. 180 tablet 3   montelukast  (SINGULAIR ) 10 MG tablet TAKE 1 TABLET BY MOUTH EVERY DAY 30 tablet 0   Multiple Vitamin (MULTIVITAMIN WITH MINERALS) TABS tablet Take 1 tablet by mouth daily.     olmesartan -hydrochlorothiazide (BENICAR  HCT) 40-25 MG tablet TAKE 1 TABLET BY MOUTH EVERY DAY 90 tablet 0   pantoprazole  (PROTONIX ) 40 MG tablet TAKE 1 TABLET BY MOUTH 2 TIMES DAILY BEFORE A meal 60 tablet 1   rizatriptan  (MAXALT -MLT) 10 MG disintegrating tablet Take 1 tablet (10 mg total) by mouth as needed for migraine. May repeat in 2 hours if needed 12 tablet 0   rosuvastatin  (CRESTOR ) 10 MG tablet Take 1 tablet (10 mg total) by mouth daily. 90 tablet 3   terconazole  (TERAZOL 7 ) 0.4 % vaginal cream Place 1 applicator vaginally at bedtime. Use for seven days 45 g 0   TURMERIC PO Take 1 capsule by mouth daily. She does not know the strength     No current facility-administered  medications for this visit.    Allergies Allergies  Allergen Reactions   Corn-Containing Products Hives    really bad asthma attacks   Honey Shortness Of Breath   Mango Flavoring Agent (Non-Screening) Hives and Swelling    Pt allergic to all mango - tongue and throat swelling   Glipizide      Other reaction(s): Other bad yeast infection; she feels she can take with probiotics   Liraglutide Nausea And Vomiting    victoza   Semaglutide  Nausea Only    gastroparesis    Histories Past Medical History:  Diagnosis Date   Allergy    Foods:  mango, honey, corn.  Allergic to everything tested  15 years ago in [place]   Anemia    with pregnancy   Anxiety    Arthritis    Shoulder and hips   Asthma age 78 yo   Triggers:  cold weather, exercise, allergens, anxiety, URIs   Cholelithiasis age 60 yo   Asymptomatic   Closed avulsion fracture of lateral malleolus of  right fibula 12/27/2016   COVID-19    Diabetes mellitus type 2 2012   Was diagnosed 2 years earlier with PCOS   Elevated liver enzymes 09/13/2016   none since 2018   GERD (gastroesophageal reflux disease)    Heart murmur    with pregnancy 20 yrs ago per pt on 10-12-2021   History of kidney stones    Hypertension 2015   Kidney stones    family history of renal failure   Migraines 2009   Morbid obesity (HCC)    PCOS (polycystic ovarian syndrome) 2010   Pneumonia    several years ago   Wears glasses    Past Surgical History:  Procedure Laterality Date   CHOLECYSTECTOMY N/A 12/01/2019   Procedure: LAPAROSCOPIC CHOLECYSTECTOMY;  Surgeon: Eletha Boas, MD;  Location: [facility];  Service: General;  Laterality: N/A;   colonscopy and endoscopy  05/25/2021   COLPOSCOPY N/A 2021   CYST EXCISION     DILITATION & CURRETTAGE/HYSTROSCOPY WITH NOVASURE ABLATION N/A 10/18/2021   Procedure: DILATATION & CURETTAGE/HYSTEROSCOPY WITH NOVASURE ABLATION;  Surgeon: Izell Harari, MD;  Location: [facility];  Service:  Gynecology;  Laterality: N/A;   LAPAROSCOPIC TUBAL LIGATION Bilateral 01/28/2019   Procedure: LAPAROSCOPIC TUBAL LIGATION AND PAP SMEAR;  Surgeon: Izell Harari, MD;  Location: [facility];  Service: Gynecology;  Laterality: Bilateral;   TONSILLECTOMY AND ADENOIDECTOMY Bilateral 12/23/2018   Procedure: TONSILLECTOMY AND ADENOIDECTOMY;  Surgeon: Karis Clunes, MD;  Location: [facility];  Service: ENT;  Laterality: Bilateral;   Social History   Socioeconomic History   Marital status: Divorced    Spouse name: Not on file   Number of children: 1   Years of education: some comm. college   Highest education level: Not on file  Occupational History   Occupation: CNA    Comment: [employer]  Tobacco Use   Smoking status: Never   Smokeless tobacco: Never  Vaping Use   Vaping status: Never Used  Substance and Sexual Activity   Alcohol use: No   Drug use: No   Sexual activity: Not Currently    Birth control/protection: Surgical    Comment: Sprintec.  Tubal ligation  Other Topics Concern   Not on file  Social History Narrative   Originally from [place], [place] to [place] in [place].   In [place] in 1999   Lives at home with daughter on [place].   Social Drivers of Corporate investment banker Strain: Not on file  Food Insecurity: No Food Insecurity (08/26/2023)   Hunger Vital Sign    Worried About Running Out of Food in the Last Year: Never true    Ran Out of Food in the Last Year: Never true  Transportation Needs: No Transportation Needs (08/26/2023)   PRAPARE - Administrator, Civil Service (Medical): No    Lack of Transportation (Non-Medical): No  Physical Activity: Not on file  Stress: Not on file  Social Connections: Unknown (09/05/2021)   [facility]   Social Network    Social Network: Not on file  Intimate Partner Violence: Unknown (07/28/2021)   Received from [facility]   HITS     Physically Hurt: Not on file    Insult or Talk Down To: Not on file    Threaten Physical Harm: Not on file    Scream or Curse: Not on file   Family History  Problem Relation Age of Onset  Healthy Mother    Diabetes Father    Hyperlipidemia Father    Hypertension Father    Heart disease Father    Kidney disease Father        Developed after 2nd CABG   Colon polyps Father    Diabetes Paternal Grandfather    Heart disease Paternal Grandfather    Hyperlipidemia Paternal Grandfather    Hypertension Paternal Grandfather    Kidney disease Paternal Grandfather    I have reviewed her medical, social, and family history in detail and updated the electronic medical record as necessary.    PHYSICAL EXAMINATION  BP 128/74   Pulse 82   Ht 5' 5 (1.651 m)   Wt 265 lb (120.2 kg)   LMP 10/14/2021 (Exact Date)   BMI 44.10 kg/m  Wt Readings from Last 3 Encounters:  11/22/23 265 lb (120.2 kg)  11/17/23 270 lb (122.5 kg)  10/28/23 267 lb 6.4 oz (121.3 kg)  GEN: NAD, appears stated age, doesn't appear chronically ill PSYCH: Cooperative, without pressured speech EYE: Conjunctivae pink, sclerae anicteric ENT: MMM CV: Nontachycardic RESP: No audible wheezing GI: NABS, soft, protuberant abdomen, rounded, no rebound  MSK/EXT: No significant lower extremity edema SKIN: No jaundice NEURO:  Alert & Oriented x 3, no focal deficits   REVIEW OF DATA  I reviewed the following data at the time of this encounter:  GI Procedures and Studies  No new GI studies to review  Laboratory Studies  Reviewed those in epic  Imaging Studies  No new studies to review  ASSESSMENT  Ms. Betty is a 48 y.o. female with a pmh significant for diabetes, nephrolithiasis, hypertension, asthma, anxiety, migraines, GERD, status post cholecystectomy, hemorrhagic gastritis, diverticulosis, colon polyps (TA's), reported food allergies.  The patient is seen today for evaluation and management of:  1. Pyrosis   2.  Abdominal gas pain   3. Bloating   4. Gastroparesis   5. Other gastritis without hemorrhage, unspecified chronicity    The patient is clinically and hemodynamically stable.  Bile salt diarrhea doing well with cholestyramine  use as needed.  Patient at risk for SIBO and EPI as well as just functional gas.  Patient to trial Phazyme instead of Gas-X to see if that may be more helpful.  SIBO breath testing to be considered.  Fecal elastase/fecal fat assessment to evaluate for EPI.  Gastroparesis symptoms are much improved now without need for Reglan , but can reconsider testing in future pending how the patient is doing.  Colonoscopy surveillance next year, will consider upper endoscopy at the same time to follow-up her previous significant vascular fibrin gastritis.  Otherwise for now unless other issues develop no plan for repeat endoscopy.  All patient questions were answered to the best of my ability, and the patient agrees to the aforementioned plan of action with follow-up as indicated.   PLAN  Continue PPI Consider gastric emptying study repeat in future if concern for persisting gastroparesis symptoms Colonoscopy 2026 EGD in 2026 as well to follow-up for Fibrin associated gastritis Continue cholestyramine  and titrate as you are doing SIBO breath testing to be considered EPI evaluation with fecal fat/fecal elastase testing Trial Phazyme rather than Gas-X Low FODMAP chart discussed with the patient   Orders Placed This Encounter  Procedures   Pancreatic elastase, fecal   Fecal fat, qualitative    New Prescriptions   No medications on file   Modified Medications   No medications on file    Planned Follow Up No follow-ups on  file.   Total Time in Face-to-Face and in Coordination of Care for patient including independent/personal interpretation/review of prior testing, medical history, examination, medication adjustment, communicating results with the patient directly, and  documentation within the EHR is 25 minutes.   Aloha Finner, MD [facility]oscopy Office # [phone]

## 2023-11-22 NOTE — Patient Instructions (Addendum)
 Your provider has requested that you go to the basement level for lab work before leaving today. Press B on the elevator. The lab is located at the first door on the left as you exit the elevator.  You have been given a testing kit to check for small intestine bacterial overgrowth (SIBO) which is completed by a company named [facility]. Make sure to return your test in the mail using the return mailing label given to you along with the kit. The test order, your demographic and insurance information have all already been sent to the company. [facility] will collect an upfront charge of $109.00 for commercial insurance plans and $229.00 if you are paying cash. The potential remaining total after claim submission and review is $120.00. Make sure to discuss with [facility] PRIOR to having the test to see if they have gotten information from your insurance company as to how much your testing will cost out of pocket, if any. Please contact [facility] at phone number [phone] to get instructions regarding how to perform the test as our office is unable to give specific testing instructions.   Try to take Aciphex 30 mins before meal.   Can use Pepcid  as needed.   Focus on decreasing number of foods in Fod-Map chart.   Follow-up in 4 months- office will contact you to schedule.  ______________________________________________________  If your blood pressure at your visit was 140/90 or greater, please contact your primary care physician to follow up on this.  _______________________________________________________  If you are age 44 or older, your body mass index should be between 23-30. Your Body mass index is 44.1 kg/m. If this is out of the aforementioned range listed, please consider follow up with your Primary Care Provider.  If you are age 52 or younger, your body mass index should be between 19-25. Your Body mass index is 44.1 kg/m. If this is out of the aformentioned  range listed, please consider follow up with your Primary Care Provider.   ________________________________________________________  The [facility] providers would like to encourage you to use MYCHART to communicate with providers for non-urgent requests or questions.  Due to long hold times on the telephone, sending your provider a message by [facility] may be a faster and more efficient way to get a response.  Please allow 48 business hours for a response.  Please remember that this is for non-urgent requests.  _______________________________________________________  [facility] is using a team-based approach to care.  Your team is made up of your doctor and two to three APPS. Our APPS (Nurse Practitioners and Physician Assistants) work with your physician to ensure care continuity for you. They are fully qualified to address your health concerns and develop a treatment plan. They communicate directly with your gastroenterologist to care for you. Seeing the Advanced Practice Practitioners on your physician's team can help you by facilitating care more promptly, often allowing for earlier appointments, access to diagnostic testing, procedures, and other specialty referrals.   Thank you for choosing me and [facility].  Dr. Wilhelmenia

## 2023-11-23 ENCOUNTER — Encounter (HOSPITAL_COMMUNITY): Payer: Self-pay

## 2023-11-23 ENCOUNTER — Encounter: Payer: Self-pay | Admitting: Gastroenterology

## 2023-11-23 ENCOUNTER — Ambulatory Visit (HOSPITAL_COMMUNITY)
Admission: RE | Admit: 2023-11-23 | Discharge: 2023-11-23 | Disposition: A | Discharge status: Home / Self Care | Source: Ambulatory Visit | Attending: Family Medicine | Admitting: Family Medicine

## 2023-11-23 VITALS — BP 114/80 | HR 83 | Temp 98.7°F | Resp 18 | Ht 65.0 in | Wt 262.0 lb

## 2023-11-23 DIAGNOSIS — R051 Acute cough: Secondary | ICD-10-CM | POA: Insufficient documentation

## 2023-11-23 DIAGNOSIS — R350 Frequency of micturition: Secondary | ICD-10-CM | POA: Insufficient documentation

## 2023-11-23 DIAGNOSIS — J3089 Other allergic rhinitis: Secondary | ICD-10-CM | POA: Insufficient documentation

## 2023-11-23 DIAGNOSIS — J029 Acute pharyngitis, unspecified: Secondary | ICD-10-CM | POA: Insufficient documentation

## 2023-11-23 DIAGNOSIS — R12 Heartburn: Secondary | ICD-10-CM | POA: Insufficient documentation

## 2023-11-23 DIAGNOSIS — R141 Gas pain: Secondary | ICD-10-CM | POA: Insufficient documentation

## 2023-11-23 DIAGNOSIS — B379 Candidiasis, unspecified: Secondary | ICD-10-CM | POA: Insufficient documentation

## 2023-11-23 DIAGNOSIS — T3695XA Adverse effect of unspecified systemic antibiotic, initial encounter: Secondary | ICD-10-CM | POA: Insufficient documentation

## 2023-11-23 DIAGNOSIS — R140 Abdominal distension (gaseous): Secondary | ICD-10-CM | POA: Insufficient documentation

## 2023-11-23 DIAGNOSIS — K3184 Gastroparesis: Secondary | ICD-10-CM | POA: Insufficient documentation

## 2023-11-23 LAB — POCT URINALYSIS DIP (MANUAL ENTRY)
Bilirubin, UA: NEGATIVE
Blood, UA: NEGATIVE
Glucose, UA: >=1000 mg/dL — AB
Leukocytes, UA: NEGATIVE
Nitrite, UA: NEGATIVE
Protein Ur, POC: NEGATIVE mg/dL
Spec Grav, UA: 1.01 (ref 1.010–1.025)
Urobilinogen, UA: 0.2 U/dL
pH, UA: 5.5 (ref 5.0–8.0)

## 2023-11-23 MED ORDER — BENZONATATE 100 MG PO CAPS: 100.0000 mg | ORAL_CAPSULE | Freq: Three times a day (TID) | ORAL | 0 refills | Status: AC

## 2023-11-23 MED ORDER — FLUCONAZOLE 150 MG PO TABS: 150.0000 mg | ORAL_TABLET | ORAL | 0 refills | Status: DC

## 2023-11-23 NOTE — ED Triage Notes (Signed)
 Was seen here last Sunday but symptoms still here. - Entered by patient.  Chief Complaint: ongoing sore throat, congestion, and sinus pressure. States the inflammation in the throat has resolved. Now also having symptoms of yeast and UTI.  Sick exposure: No  Onset: 6 days   Prescriptions or OTC medications tried: Yes- antibiotic injection, Nyquil, Advil    with mild relief  New foods, medications, or products: No  Recent Travel: No

## 2023-11-23 NOTE — Discharge Instructions (Addendum)
 I suspect that your symptoms due to yeast infection of the vagina.  Take diflucan  1 pill today, then again in 3 days if you continue to have symptoms.   Your swab has been sent for testing, staff will call you in 2-3 days if you test positive for any other infections and will provide treatment at that time.  Drink plenty of water to stay well-hydrated. Avoid drinking caffeine/sugary drinks.  You may take Tessalon  Perles every 8 hours as needed for cough/congestion.  Continue taking your allergy pills for nasal congestion/sore throat.  Take Tylenol  as needed for sore throat and use salt water gargles as needed.  Follow-up with PCP for ongoing management.

## 2023-11-23 NOTE — ED Provider Notes (Signed)
 [facility]    CSN: [id number] Arrival date & time: 11/23/23  1153      History   Chief Complaint Chief Complaint  Patient presents with   Sore Throat   Appointment    12:00    HPI Lori Shea is a 48 y.o. female.   Lori Shea is a 48 y.o. female presenting for chief complaint of sore throat that started approximately 10 days ago.  She was seen for sore throat at urgent care 6 days ago where she tested negative for strep throat, throat culture was not sent, she was given steroid shot for inflammation, and empirically treated with azithromycin .  Azithromycin  did not help much with her sore throat.  Sore throat has not changed in the last 6 or 7 days since urgent care visit.  Her ears feel full and she describes a generalized headache.  Additionally reports intermittent rhinorrhea, nasal congestion, and dry cough that she suspects is due to postnasal drainage and sore throat.  History of allergies, takes Zyrtec  and Singulair  daily.  History of asthma, denies wheezing and shortness of breath/chest pain.  No recent sick contacts with similar symptoms.  She is now experiencing vaginal itching and vaginal irritation.  She states antibiotics frequently cause her to have yeast infections to the vaginal region.  History of type 2 diabetes that is currently well-controlled.  She takes Jardiance  SGLT-2 inhibitor.  She additionally reports urinary frequency, urinary urgency, low back pain, suprapubic pain, and intermittent nausea without vomiting. No flank pain, fever, chills, diarrhea, constipation, blood/mucus in the stools or urine, or dysuria. She is using over-the-counter Monistat to treat suspected vaginal yeast infection without relief.   Sore Throat    Past Medical History:  Diagnosis Date   Allergy    Foods:  mango, honey, corn.  Allergic to everything tested  15 years ago in [place]   Anemia    with pregnancy   Anxiety    Arthritis    Shoulder and hips    Asthma age 57 yo   Triggers:  cold weather, exercise, allergens, anxiety, URIs   Cholelithiasis age 5 yo   Asymptomatic   Closed avulsion fracture of lateral malleolus of right fibula 12/27/2016   COVID-19    Diabetes mellitus type 2 2012   Was diagnosed 2 years earlier with PCOS   Elevated liver enzymes 09/13/2016   none since 2018   GERD (gastroesophageal reflux disease)    Heart murmur    with pregnancy 20 yrs ago per pt on 10-12-2021   History of kidney stones    Hypertension 2015   Kidney stones    family history of renal failure   Migraines 2009   Morbid obesity (HCC)    PCOS (polycystic ovarian syndrome) 2010   Pneumonia    several years ago   Wears glasses     Patient Active Problem List   Diagnosis Date Noted   Pyrosis 11/23/2023   Abdominal gas pain 11/23/2023   Bloating 11/23/2023   Gastroparesis 11/23/2023   Left upper arm pain 12/10/2022   Soft tissue infection 12/10/2022   UTI symptoms 12/10/2022   Chronic diarrhea 01/25/2022   History of colonic polyps 01/25/2022   Gastroesophageal reflux disease 01/25/2022   Chronic nausea 01/25/2022   Gastritis without bleeding 01/25/2022   Stress incontinence of urine 01/02/2021   Skin irritation 01/02/2021   Hepatic steatosis 12/01/2019   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/2019   Hemoglobin A1C greater than 9%, indicating poor  diabetic control 03/23/2019   Hyperglycemia 03/23/2019   Class 3 severe obesity due to excess calories with serious comorbidity and body mass index (BMI) of 40.0 to 44.9 in adult 03/23/2019   Yeast infection 03/23/2019   BMI 40.0-44.9, adult (HCC) 03/09/2019   Cervical dysplasia 07/31/2017   Alopecia of scalp 05/09/2017   Pain in left toe(s) 12/27/2016   Morbid obesity (HCC) 09/13/2016   Elevated liver enzymes 09/13/2016   Allergy    Asthma    Hypertension 04/23/2013   Irregular periods 02/22/2011   Type 2 diabetes mellitus with hyperglycemia, with long-term current use  of insulin  (HCC) 04/23/2010   PCOS (polycystic ovarian syndrome) 04/23/2008   Migraines 04/24/2007    Past Surgical History:  Procedure Laterality Date   CHOLECYSTECTOMY N/A 12/01/2019   Procedure: LAPAROSCOPIC CHOLECYSTECTOMY;  Surgeon: Eletha Boas, MD;  Location: [facility];  Service: General;  Laterality: N/A;   colonscopy and endoscopy  05/25/2021   COLPOSCOPY N/A 2021   CYST EXCISION     DILITATION & CURRETTAGE/HYSTROSCOPY WITH NOVASURE ABLATION N/A 10/18/2021   Procedure: DILATATION & CURETTAGE/HYSTEROSCOPY WITH NOVASURE ABLATION;  Surgeon: Izell Harari, MD;  Location: [facility] [place];  Service: Gynecology;  Laterality: N/A;   LAPAROSCOPIC TUBAL LIGATION Bilateral 01/28/2019   Procedure: LAPAROSCOPIC TUBAL LIGATION AND PAP SMEAR;  Surgeon: Izell Harari, MD;  Location: [facility];  Service: Gynecology;  Laterality: Bilateral;   TONSILLECTOMY AND ADENOIDECTOMY Bilateral 12/23/2018   Procedure: TONSILLECTOMY AND ADENOIDECTOMY;  Surgeon: Karis Clunes, MD;  Location: [facility];  Service: ENT;  Laterality: Bilateral;    OB History     Gravida  4   Para  1   Term  1   Preterm  0   AB  3   Living  1      SAB  3   IAB  0   Ectopic  0   Multiple  0   Live Births  1            Home Medications    Prior to Admission medications   Medication Sig Start Date End Date Taking? Authorizing Provider  albuterol  (PROVENTIL ) (2.5 MG/3ML) 0.083% nebulizer solution USE 1 VIAL VIA NEBULIZER EVERY 6 HOURS AS NEEDED FOR WHEEZING OR SHORTNESS OF BREATH 01/23/23  Yes Oley Bascom RAMAN, NP  albuterol  (VENTOLIN  HFA) 108 (90 Base) MCG/ACT inhaler INHALE 2 PUFFS BY MOUTH INTO THE LUNGS EVERY 4 HOURS AS NEEDED FOR WHEEZING OR SHORTNESS OF BREATH 10/01/23  Yes Nichols, Tonya S, NP  amitriptyline  (ELAVIL ) 25 MG tablet TAKE 1 TABLET BY MOUTH AT BEDTIME 10/28/23  Yes Nichols, Tonya S, NP  Bacillus Coagulans-Inulin (PROBIOTIC-PREBIOTIC PO) Take 1 capsule  by mouth daily.   Yes [provider]  benzonatate  (TESSALON ) 100 MG capsule Take 1 capsule (100 mg total) by mouth every 8 (eight) hours. 11/23/23  Yes Enedelia Dorna HERO, FNP  cetirizine  (ZYRTEC ) 10 MG tablet TAKE 1 TABLET BY MOUTH EVERY DAY 10/28/23  Yes Nichols, Tonya S, NP  cholecalciferol (VITAMIN D3) 25 MCG (1000 UNIT) tablet Take 1,000 Units by mouth daily.   Yes [provider]  cholestyramine  (QUESTRAN ) 4 g packet Take 1 packet (4 g total) by mouth 2 (two) times daily. 10/02/22  Yes Mansouraty, Aloha Raddle., MD  Continuous Glucose Sensor (FREESTYLE LIBRE 3 PLUS SENSOR) MISC Change sensor every 15 days. 08/21/23  Yes Reardon, Benton PARAS, NP  ELDERBERRY PO Take 1 tablet by mouth daily. She does not  know the  strength   Yes [provider]  empagliflozin  (JARDIANCE ) 25 MG TABS tablet Take 1 tablet (25 mg total) by mouth daily. 08/21/23  Yes Therisa Benton PARAS, NP  famotidine  (PEPCID ) 20 MG tablet Take 1 tablet (20 mg total) by mouth 2 (two) times daily. 10/28/23  Yes Oley Bascom RAMAN, NP  fluconazole  (DIFLUCAN ) 150 MG tablet Take 1 tablet (150 mg total) by mouth every 3 (three) days. 11/23/23  Yes Enedelia Dorna HERO, FNP  fluticasone -salmeterol (ADVAIR DISKUS) 100-50 MCG/ACT AEPB Inhale 1 puff into the lungs 2 (two) times daily. 10/04/22  Yes Oley Bascom RAMAN, NP  insulin  glargine, 2 Unit Dial , (TOUJEO  MAX SOLOSTAR) 300 UNIT/ML Solostar Pen Inject 80 Units into the skin daily. May titrate up to 90 units daily if instructed by your provider. 10/01/23  Yes Paseda, Folashade R, FNP  insulin  lispro (HUMALOG ) 100 UNIT/ML KwikPen INJECT 16-22 UNITS into THE SKIN 3 TIMES DAILY BEFORE MEALS 06/04/23  Yes Therisa Benton PARAS, NP  Insulin  Pen Needle (PEN NEEDLES) 31G X 8 MM MISC Use to inject insulin  4 times daily 02/06/23  Yes Reardon, Benton PARAS, NP  JANUVIA  100 MG tablet TAKE 1 TABLET BY MOUTH EVERY DAY 09/23/23  Yes Therisa Benton PARAS, NP  lactobacillus acidophilus (BACID) TABS tablet  Take 1 tablet by mouth daily.   Yes [provider]  meloxicam  (MOBIC ) 15 MG tablet Take 1 tablet (15 mg total) by mouth daily as needed for pain. 10/27/23  Yes Oley Bascom RAMAN, NP  metFORMIN  (GLUCOPHAGE ) 500 MG tablet Take 1 tablet (500 mg total) by mouth 2 (two) times daily with a meal. 02/06/23  Yes Reardon, Benton PARAS, NP  montelukast  (SINGULAIR ) 10 MG tablet TAKE 1 TABLET BY MOUTH EVERY DAY 10/22/23  Yes Nichols, Tonya S, NP  Multiple Vitamin (MULTIVITAMIN WITH MINERALS) TABS tablet Take 1 tablet by mouth daily.   Yes [provider]  olmesartan -hydrochlorothiazide (BENICAR  HCT) 40-25 MG tablet TAKE 1 TABLET BY MOUTH EVERY DAY 09/06/23  Yes Nichols, Tonya S, NP  pantoprazole  (PROTONIX ) 40 MG tablet TAKE 1 TABLET BY MOUTH 2 TIMES DAILY BEFORE A meal 10/01/23  Yes Mansouraty, Aloha Raddle., MD  rizatriptan  (MAXALT -MLT) 10 MG disintegrating tablet Take 1 tablet (10 mg total) by mouth as needed for migraine. May repeat in 2 hours if needed 11/23/22  Yes Oley Bascom RAMAN, NP  rosuvastatin  (CRESTOR ) 10 MG tablet Take 1 tablet (10 mg total) by mouth daily. 07/26/23  Yes Oley Bascom RAMAN, NP  terconazole  (TERAZOL 7 ) 0.4 % vaginal cream Place 1 applicator vaginally at bedtime. Use for seven days 09/02/23  Yes Ajewole, Christana, MD  TURMERIC PO Take 1 capsule by mouth daily. She does not know the strength   Yes [provider]    Family History Family History  Problem Relation Age of Onset   Healthy Mother    Diabetes Father    Hyperlipidemia Father    Hypertension Father    Heart disease Father    Kidney disease Father        Developed after 2nd CABG   Colon polyps Father    Diabetes Paternal Grandfather    Heart disease Paternal Grandfather    Hyperlipidemia Paternal Grandfather    Hypertension Paternal Grandfather    Kidney disease Paternal Grandfather     Social History Social History   Tobacco Use   Smoking status: Never   Smokeless tobacco: Never  Vaping Use    Vaping status: Never Used  Substance Use Topics  Alcohol use: No   Drug use: No     Allergies   Corn-containing products, Honey, Mango flavoring agent (non-screening), Glipizide , Liraglutide, and Semaglutide    Review of Systems Review of Systems Per HPI  Physical Exam Triage Vital Signs ED Triage Vitals  Encounter Vitals Group     BP 11/23/23 1207 114/80     Girls Systolic BP Percentile --      Girls Diastolic BP Percentile --      Boys Systolic BP Percentile --      Boys Diastolic BP Percentile --      Pulse Rate 11/23/23 1207 83     Resp 11/23/23 1207 18     Temp 11/23/23 1207 98.7 F (37.1 C)     Temp Source 11/23/23 1207 Oral     SpO2 11/23/23 1207 96 %     Weight 11/23/23 1207 262 lb (118.8 kg)     Height 11/23/23 1207 5' 5 (1.651 m)     Head Circumference --      Peak Flow --      Pain Score 11/23/23 1204 8     Pain Loc --      Pain Education --      Exclude from Growth Chart --    No data found.  Updated Vital Signs BP 114/80 (BP Location: Left Arm)   Pulse 83   Temp 98.7 F (37.1 C) (Oral)   Resp 18   Ht 5' 5 (1.651 m)   Wt 262 lb (118.8 kg)   LMP 10/14/2021 (Exact Date)   SpO2 96%   BMI 43.60 kg/m   Visual Acuity Right Eye Distance:   Left Eye Distance:   Bilateral Distance:    Right Eye Near:   Left Eye Near:    Bilateral Near:     Physical Exam Vitals and nursing note reviewed.  Constitutional:      Appearance: She is not ill-appearing or toxic-appearing.  HENT:     Head: Normocephalic and atraumatic.     Right Ear: Hearing, tympanic membrane, ear canal and external ear normal.     Left Ear: Hearing, tympanic membrane, ear canal and external ear normal.     Nose: Congestion present.     Mouth/Throat:     Lips: Pink.     Mouth: Mucous membranes are moist. No injury or oral lesions.     Dentition: Normal dentition.     Tongue: No lesions.     Pharynx: Oropharynx is clear. Uvula midline. No pharyngeal swelling, oropharyngeal  exudate, posterior oropharyngeal erythema, uvula swelling or postnasal drip.     Tonsils: No tonsillar exudate or tonsillar abscesses.     Comments: Moderate amount of clear/purulent postnasal drainage streaking to the posterior oropharynx.  Minimal erythema. Eyes:     General: Lids are normal. Vision grossly intact. Gaze aligned appropriately.     Extraocular Movements: Extraocular movements intact.     Conjunctiva/sclera: Conjunctivae normal.  Neck:     Trachea: Trachea and phonation normal.  Cardiovascular:     Rate and Rhythm: Normal rate and regular rhythm.     Heart sounds: Normal heart sounds, S1 normal and S2 normal.  Pulmonary:     Effort: Pulmonary effort is normal. No respiratory distress.     Breath sounds: Normal breath sounds and air entry. No wheezing, rhonchi or rales.  Chest:     Chest wall: No tenderness.  Abdominal:     General: Bowel sounds are normal.     Palpations:  Abdomen is soft.     Tenderness: There is no abdominal tenderness. There is no right CVA tenderness, left CVA tenderness or guarding.  Musculoskeletal:     Cervical back: Neck supple.  Lymphadenopathy:     Cervical: No cervical adenopathy.  Skin:    General: Skin is warm and dry.     Capillary Refill: Capillary refill takes less than 2 seconds.     Findings: No rash.  Neurological:     General: No focal deficit present.     Mental Status: She is alert and oriented to person, place, and time. Mental status is at baseline.     Cranial Nerves: No dysarthria or facial asymmetry.  Psychiatric:        Mood and Affect: Mood normal.        Speech: Speech normal.        Behavior: Behavior normal.        Thought Content: Thought content normal.        Judgment: Judgment normal.      UC Treatments / Results  Labs (all labs ordered are listed, but only abnormal results are displayed) Labs Reviewed  POCT URINALYSIS DIP (MANUAL ENTRY) - Abnormal; Notable for the following components:      Result  Value   Clarity, UA turbid (*)    Glucose, UA >=1,000 (*)    Ketones, POC UA trace (5) (*)    All other components within normal limits  URINE CULTURE  CERVICOVAGINAL ANCILLARY ONLY    EKG   Radiology No results found.  Procedures Procedures (including critical care time)  Medications Ordered in UC Medications - No data to display  Initial Impression / Assessment and Plan / UC Course  I have reviewed the triage vital signs and the nursing notes.  Pertinent labs & imaging results that were available during my care of the patient were reviewed by me and considered in my medical decision making (see chart for details).   1.  Nonseasonal allergic rhinitis, sore throat, acute cough Congestion, cough, and sore throat are all likely secondary to nonseasonal allergic rhinitis versus bacterial pharyngitis/Epstein-Barr virus. Continue allergy medications (Singulair  and Zyrtec ), follow-up with PCP. Tessalon  Perles every 8 hours as needed for cough. Lungs clear, therefore deferred imaging of the chest.  2.  Antibiotic induced yeast infection, urinary frequency Urinalysis shows greater than 1000 secondary to SGLT-2 inhibitor use. No overt signs of UTI, however urine culture is sent due to risks for UTI with jardiance  and symptoms.  Advised to avoid urinary irritants in the meantime and follow-up with PCP. No signs of urosepsis, pyelonephritis, nephrolithiasis, etc.  Counseled patient on potential for adverse effects with medications prescribed/recommended today, strict ER and return-to-clinic precautions discussed, patient verbalized understanding.    Final Clinical Impressions(s) / UC Diagnoses   Final diagnoses:  Non-seasonal allergic rhinitis, unspecified trigger  Sore throat  Antibiotic-induced yeast infection  Acute cough  Urinary frequency     Discharge Instructions      I suspect that your symptoms due to yeast infection of the vagina.  Take diflucan  1 pill today, then  again in 3 days if you continue to have symptoms.   Your swab has been sent for testing, staff will call you in 2-3 days if you test positive for any other infections and will provide treatment at that time.  Drink plenty of water to stay well-hydrated. Avoid drinking caffeine/sugary drinks.  You may take Tessalon  Perles every 8 hours as needed for cough/congestion.  Continue taking your allergy pills for nasal congestion/sore throat.  Take Tylenol  as needed for sore throat and use salt water gargles as needed.  Follow-up with PCP for ongoing management.     ED Prescriptions     Medication Sig Dispense Auth. Provider   fluconazole  (DIFLUCAN ) 150 MG tablet Take 1 tablet (150 mg total) by mouth every 3 (three) days. 2 tablet Enedelia Dorna HERO, FNP   benzonatate  (TESSALON ) 100 MG capsule Take 1 capsule (100 mg total) by mouth every 8 (eight) hours. 21 capsule Enedelia Dorna HERO, FNP      PDMP not reviewed this encounter.   Enedelia Dorna HERO, [place] 11/23/23 1346

## 2023-11-25 ENCOUNTER — Telehealth: Payer: Self-pay

## 2023-11-25 ENCOUNTER — Other Ambulatory Visit: Payer: Self-pay

## 2023-11-25 ENCOUNTER — Other Ambulatory Visit: Payer: Self-pay | Admitting: Nurse Practitioner

## 2023-11-25 ENCOUNTER — Ambulatory Visit (HOSPITAL_COMMUNITY): Payer: Self-pay

## 2023-11-25 ENCOUNTER — Other Ambulatory Visit (HOSPITAL_COMMUNITY): Payer: Self-pay

## 2023-11-25 DIAGNOSIS — E1165 Type 2 diabetes mellitus with hyperglycemia: Secondary | ICD-10-CM

## 2023-11-25 DIAGNOSIS — M79622 Pain in left upper arm: Secondary | ICD-10-CM

## 2023-11-25 DIAGNOSIS — J4530 Mild persistent asthma, uncomplicated: Secondary | ICD-10-CM

## 2023-11-25 LAB — CERVICOVAGINAL ANCILLARY ONLY
Bacterial Vaginitis (gardnerella): NEGATIVE
Candida Glabrata: POSITIVE — AB
Candida Vaginitis: NEGATIVE
Comment: NEGATIVE
Comment: NEGATIVE
Comment: NEGATIVE

## 2023-11-25 LAB — URINE CULTURE: Culture: >=100000 — AB

## 2023-11-25 MED ORDER — CEFDINIR 300 MG PO CAPS: 300.0000 mg | ORAL_CAPSULE | Freq: Two times a day (BID) | ORAL | 0 refills | Status: AC

## 2023-11-25 MED ORDER — TOUJEO MAX SOLOSTAR 300 UNIT/ML ~~LOC~~ SOPN: 90.0000 [IU] | PEN_INJECTOR | Freq: Every day | SUBCUTANEOUS | 11 refills | Status: AC

## 2023-11-25 NOTE — Progress Notes (Signed)
 11/25/2023 Name: Lori Shea MRN: [id number] DOB: 12-Dec-1975  Chief Complaint  Patient presents with   Diabetes    Lori Shea is a 48 y.o. year old female who presented for a telephone visit.   They were referred to the pharmacist by their PCP for assistance in managing diabetes. PMH includes HTN, gastroparesis, cholelithiasis with chronic cholecystitis, MASL, GERD, T2DM, PCOS, BMI > 40.   Subjective: Patient was last seen by PCP, Bascom Borer, NP on 10/28/23. A1C increased from 8.2% to 8.6%. LDL-C decreased from 116 mg/dL to 40 mg/dL after restarting rosuvastatin  10 mg daily. She was last engaged by pharmacy via telephone on 09/30/23. At this appointment she was instructed to switch from Lantus  to Toujeo  U-300. She was also instructed to continue Humalog  5 units with breakfast and dinner and increase to 8 units with dinner, with instructions to continue titrating dinner insulin  dose up to a maximum of 20 units.   Reports that she is doing well, feels that Toujeo  U-300 is working better for her. She reports that she tried to increase dinner time Humalog  dose - did 10 units at dinner for 1x week after our last call and noticed that her sugars stayed elevated overnight, even higher than usual. So she went back to taking 5 units with dinner, and BG normalized again.   Patient reports that she is interested in being seen a [facility] in [place], since driving to [place] is not convenient for her. She also reports that her sugars have been intermittently running higher due to illness/infection. She was given azithromycin . Subsequently she developed s/sx of a UTI and yeast infection. She will start treatment for this (Cefdinir  and fluconazole ) tomorrow.  Care Team: Primary Care Provider: Borer Bascom RAMAN, NP ; Next Scheduled Visit: 01/29/24 Endocrinology: Benton Rio, NP; Patient cancelled, would like to schedule with [facility] Gastroenterology: Aloha Finner, MD:  Scheduled 11/22/23  Medication Access/Adherence  Current Pharmacy:  [facility] - [place], [place] - [address] [place] [address] Phone: [phone] Fax: [phone]  [id number] [place], [place] - [address] AT [facility] [address] [facility] [address] [place] [address] Phone: [phone] Fax: [phone]   Patient reports affordability concerns with their medications: No  Patient reports access/transportation concerns to their pharmacy: No  Patient reports adherence concerns with their medications:  No     Diabetes:  Current medications: Jardiance  25 mg daily, Toujeo  U300 80 units once daily at bedtime, Humalog  16-22 units TID (patient takes 5 units with meals - typically takes with her first bite), Januvia  100 mg daily, metformin  IR 500 mg BID  Medications tried in the past: metformin  1000 mg BID - GI AE, glipizide  - yeast infection, liraglutide - n/v, Rybelsus  - n/v and gastroparesis  Denies UTI/yeast infections recently with Jardiance . Has been having lower back pain - but had recent urinalysis without s/sx of infection.   Current glucose readings:  Using Freestyle Libre 3 Plus CGM - sensors have been working well since our last appt  Date of Download: 11/12/23 to 11/25/23 % Time CGM is active: 96% Average Glucose: 196 mg/dL Glucose Management Indicator: 8.0 Glucose Variability: 22.4 (goal <36%) Time in Goal:  - Time in range 70-180: 38% - Time above range: 62% (9% very high) > compared to 20% very high last time - Time below range: 0% Observed patterns: More stable since switching to Toujeo  U-300. Spikes correlate  with patient-reported illness (sore throat, then UTI)       Patient denies hypoglycemic s/sx including dizziness, shakiness, sweating. Patient reports hyperglycemic symptoms including irritability, anxious. Denies polyuria, polydipsia, polyphagia, nocturia, neuropathy, blurred  vision.  Current meal patterns: Has issues with digestion if she eats too much meat. - Breakfast: protein shake with a fruit (apple, banana) - usually is not hungry in the morning - Lunch: healthy lunch available at work - protein with vegetables - Supper - eats dinner around 5-5:30PM. Has a protein, starch (1/2 cup of rice, mashed potatoes), vegetables. Does usually have something sweet.  - Snacks - none - Drinks - drinking gallon of water daily  Current physical activity: Walking 2.5 miles daily  Current medication access support: none  Hypertension:  Current medications: olmesartan -hydrochlorothiazide 40-25 mg daily  Hyperlipidemia/ASCVD Risk Reduction  Current lipid lowering medications: rosuvastatin  10 mg daily Medications tried in the past: simvastatin  - d/c in the setting of drug recall  Clinical ASCVD: No  The ASCVD Risk score (Arnett DK, et al., 2019) failed to calculate for the following reasons:   The valid total cholesterol range is 130 to 320 mg/dL    Objective:  BP Readings from Last 3 Encounters:  11/23/23 114/80  11/22/23 128/74  11/19/23 132/84    Lab Results  Component Value Date   HGBA1C 8.6 (H) 10/28/2023    Lab Results  Component Value Date   CREATININE 0.90 07/26/2023   BUN 16 07/26/2023   NA 141 07/26/2023   K 4.1 07/26/2023   CL 102 07/26/2023   CO2 24 07/26/2023    Lab Results  Component Value Date   CHOL 113 10/28/2023   HDL 35 (L) 10/28/2023   LDLCALC 40 10/28/2023   TRIG 244 (H) 10/28/2023   CHOLHDL 3.2 10/28/2023    Medications Reviewed Today     Reviewed by Brinda Lorain SQUIBB, RPH (Pharmacist) on 11/25/23 at 1429  Med List Status: <None>   Medication Order Taking? Sig Documenting Provider Last Dose Status Informant  albuterol  (PROVENTIL ) (2.5 MG/3ML) 0.083% nebulizer solution [id number]  USE 1 VIAL VIA NEBULIZER EVERY 6 HOURS AS NEEDED FOR WHEEZING OR SHORTNESS OF SHERIDA Oley Bascom GORMAN, NP  Active   albuterol  (VENTOLIN  HFA) 108  (90 Base) MCG/ACT inhaler [id number]  INHALE 2 PUFFS BY MOUTH INTO THE LUNGS EVERY 4 HOURS AS NEEDED FOR WHEEZING OR SHORTNESS OF BREATH Nichols, Tonya S, NP  Active   amitriptyline  (ELAVIL ) 25 MG tablet [id number]  TAKE 1 TABLET BY MOUTH AT BEDTIME Nichols, Tonya S, NP  Active   Bacillus Coagulans-Inulin (PROBIOTIC-PREBIOTIC PO) [id number]  Take 1 capsule by mouth daily. [provider]  Active   benzonatate  (TESSALON ) 100 MG capsule [id number]  Take 1 capsule (100 mg total) by mouth every 8 (eight) hours. Enedelia Dorna HERO, FNP  Active   cefdinir  (OMNICEF ) 300 MG capsule [id number]  Take 1 capsule (300 mg total) by mouth 2 (two) times daily for 7 days. Hermanns, Ashlee P, PA-C  Active   cetirizine  (ZYRTEC ) 10 MG tablet [id number]  TAKE 1 TABLET BY MOUTH EVERY DAY Nichols, Tonya S, NP  Active   cholecalciferol (VITAMIN D3) 25 MCG (1000 UNIT) tablet [id number]  Take 1,000 Units by mouth daily. [provider]  Active   cholestyramine  (QUESTRAN ) 4 g packet [id number]  Take 1 packet (4 g total) by mouth 2 (two) times daily. Mansouraty, Aloha Raddle., MD  Active            Med Note Captiva,  ASAJAH R   Fri Jul 05, 2023 11:49 AM) Taking differently: 1 packet PRN   Continuous Glucose Sensor (FREESTYLE LIBRE 3 PLUS SENSOR) MISC [id number]  Change sensor every 15 days. Therisa Benton PARAS, NP  Active   ELDERBERRY PO [id number]  Take 1 tablet by mouth daily. She does not  know the strength [provider]  Active   empagliflozin  (JARDIANCE ) 25 MG TABS tablet [id number] Yes Take 1 tablet (25 mg total) by mouth daily. Therisa Benton PARAS, NP  Active   famotidine  (PEPCID ) 20 MG tablet [id number]  Take 1 tablet (20 mg total) by mouth 2 (two) times daily. Oley Bascom RAMAN, NP  Active   fluconazole  (DIFLUCAN ) 150 MG tablet [id number]  Take 1 tablet (150 mg total) by mouth every 3 (three) days. Enedelia Dorna HERO, FNP  Active   fluticasone -salmeterol (ADVAIR DISKUS) 100-50 MCG/ACT AEPB [id number]   Inhale 1 puff into the lungs 2 (two) times daily. Oley Bascom RAMAN, NP  Active   insulin  glargine, 2 Unit Dial , (TOUJEO  MAX SOLOSTAR) 300 UNIT/ML Solostar Pen [id number] Yes Inject 80 Units into the skin daily. May titrate up to 90 units daily if instructed by your provider. Paseda, Folashade R, FNP  Active   insulin  lispro (HUMALOG ) 100 UNIT/ML KwikPen [id number] Yes INJECT 16-22 UNITS into THE SKIN 3 TIMES DAILY BEFORE MEALS Reardon, Benton PARAS, NP  Active            Med Note MAYER, ASAJAH R   Fri Jul 05, 2023 11:52 AM) Taking differently: 5 units TID  Insulin  Pen Needle (PEN NEEDLES) 31G X 8 MM MISC [id number]  Use to inject insulin  4 times daily Therisa Benton PARAS, NP  Active   JANUVIA  100 MG tablet [id number] Yes TAKE 1 TABLET BY MOUTH EVERY DAY Therisa Benton PARAS, NP  Active   lactobacillus acidophilus (BACID) TABS tablet [id number]  Take 1 tablet by mouth daily. [provider]  Active   meloxicam  (MOBIC ) 15 MG tablet [id number]  Take 1 tablet (15 mg total) by mouth daily as needed for pain. Oley Bascom RAMAN, NP  Active   metFORMIN  (GLUCOPHAGE ) 500 MG tablet [id number] Yes Take 1 tablet (500 mg total) by mouth 2 (two) times daily with a meal. Therisa Benton PARAS, NP  Active   montelukast  (SINGULAIR ) 10 MG tablet [id number]  TAKE 1 TABLET BY MOUTH EVERY DAY Nichols, Tonya S, NP  Active   Multiple Vitamin (MULTIVITAMIN WITH MINERALS) TABS tablet [id number]  Take 1 tablet by mouth daily. [provider]  Active   olmesartan -hydrochlorothiazide (BENICAR  HCT) 40-25 MG tablet [id number] Yes TAKE 1 TABLET BY MOUTH EVERY DAY Nichols, Tonya S, NP  Active   pantoprazole  (PROTONIX ) 40 MG tablet [id number]  TAKE 1 TABLET BY MOUTH 2 TIMES DAILY BEFORE A meal Mansouraty, Aloha Raddle., MD  Active   rizatriptan  (MAXALT -MLT) 10 MG disintegrating tablet [id number]  Take 1 tablet (10 mg total) by mouth as needed for migraine. May repeat in 2 hours if needed Oley Bascom RAMAN, NP  Active   rosuvastatin   (CRESTOR ) 10 MG tablet [id number] Yes Take 1 tablet (10 mg total) by mouth daily. Oley Bascom RAMAN, NP  Active   terconazole  (TERAZOL 7 ) 0.4 % vaginal cream [id number]  Place 1 applicator vaginally at bedtime. Use for seven days Ajewole, Christana, MD  Active   TURMERIC PO [id number]  Take 1 capsule by mouth daily. She does not know the strength [provider]  Active   Med List Note Maria, [employer]  M, NP 12/23/19 1206):                Assessment/Plan:   Diabetes: - Currently uncontrolled with last A1C of 8.6% above goal < 7%, but improved from 9.3%. Per AGP report, BG are much more stable since transitioning to concentrated insulin , but are still running high. Inexplicably, patient reports that increasing her dose of Humalog  to 10 units (instead of 5 units) after dinner increases her BG overnight. This trend is reflected on her AGP after our last appt. She is not having hypoglycemia, or regular spikes after meals, so it is appropriate to continue titrating her basal insulin  dose at this time. Patient cannot take GLP-1RA or GLP-1/GIP RA due to gastroparesis. Will consider addition of TZD, pioglitazone at follow-up. - Reviewed long term cardiovascular and renal outcomes of uncontrolled blood sugar - Reviewed goal A1c, goal fasting, and goal 2 hour post prandial glucose - Reviewed dietary modifications including controlling portion size of carbohydrate heavy foods. Commended for excellent adherence to healthy diet.  - Reviewed lifestyle modifications including: continuing regular physical activity. Congratulated on progress with walking 2.5 miles daily.  - Recommend to continue Jardiance  25 mg daily, Januvia  100 mg daily, metformin  IR 500 mg BID  - Recommend to increase to Toujeo  U300 86 units nightly, if no change in BG after 3 days, titrate to 90 units nightly. Patient using $35 copay card for Toujeo . Updated prescription to allow her to get 30ds at a time. - Recommend to continue  Humalog  5 units TID with meals - Will request that PCP place new referral to [facility] in [place]. Provided patient with phone number today. - Patient denies personal or family history of multiple endocrine neoplasia type 2, medullary thyroid cancer; personal history of pancreatitis or gallbladder disease. - Recommend to check glucose continuously with FL3+ CGM.    Hypertension: - Currently controlled - Reviewed long term cardiovascular and renal outcomes of uncontrolled blood pressure - Recommend to continue olmesartan -hydrochlorothiazide 40-25 mg daily    Hyperlipidemia/ASCVD Risk Reduction: - Currently controlled with last LDL-C of 40 mg/dL below goal < 70 mg/dL given U7IF with comorbidities - Reviewed long term complications of uncontrolled cholesterol - Recommend to continue rosuvastatin  10 mg daily    Follow Up Plan: PCP 01/29/24, PharmD telephone 12/20/23, Endo needs to be scheduled  Lorain Baseman, PharmD [facility] [phone]

## 2023-11-25 NOTE — Telephone Encounter (Signed)
 RX sent for cefdinir  given urine culture results

## 2023-11-25 NOTE — Telephone Encounter (Signed)
 Pharmacy Patient Advocate Encounter   Received notification from CoverMyMeds that prior authorization for Januvia  100MG  tablets is required/requested.   Insurance verification completed.   The patient is insured through [facility] .   Per test claim: Refill too soon. PA is not needed at this time. Medication was filled 10/28/23. Next eligible fill date is 11/26/23.

## 2023-11-26 ENCOUNTER — Ambulatory Visit (HOSPITAL_BASED_OUTPATIENT_CLINIC_OR_DEPARTMENT_OTHER)
Admission: RE | Admit: 2023-11-26 | Discharge: 2023-11-26 | Disposition: A | Discharge status: Home / Self Care | Source: Ambulatory Visit | Attending: Physician Assistant | Admitting: Physician Assistant

## 2023-11-26 DIAGNOSIS — D492 Neoplasm of unspecified behavior of bone, soft tissue, and skin: Secondary | ICD-10-CM | POA: Diagnosis present

## 2023-11-27 ENCOUNTER — Ambulatory Visit (INDEPENDENT_AMBULATORY_CARE_PROVIDER_SITE_OTHER): Admitting: Student

## 2023-11-27 ENCOUNTER — Other Ambulatory Visit

## 2023-11-27 ENCOUNTER — Ambulatory Visit: Admitting: Nurse Practitioner

## 2023-11-27 VITALS — BP 117/83 | HR 85 | Ht 65.0 in | Wt 262.2 lb

## 2023-11-27 DIAGNOSIS — R141 Gas pain: Secondary | ICD-10-CM

## 2023-11-27 DIAGNOSIS — R140 Abdominal distension (gaseous): Secondary | ICD-10-CM

## 2023-11-27 DIAGNOSIS — R2232 Localized swelling, mass and lump, left upper limb: Secondary | ICD-10-CM

## 2023-11-27 DIAGNOSIS — R12 Heartburn: Secondary | ICD-10-CM

## 2023-11-27 DIAGNOSIS — K3184 Gastroparesis: Secondary | ICD-10-CM

## 2023-11-27 NOTE — Progress Notes (Cosign Needed Addendum)
 Referring Provider Oley Bascom RAMAN, NP [address] [place],  [place] [address]   CC:  Chief Complaint  Patient presents with   Advice Only      Lori Shea is an 48 y.o. female.  HPI: Patient is a 48 year old female who presents to the clinic today for evaluation of a lesion to her left arm.  Per chart review, patient saw dermatology on 11/19/2023.  At this visit, patient reported she had a palpable nodule on the left upper inner arm that had been present for over a year and would sometimes get sore.  Patient does have history of cyst excision to the chest.  Ultrasound was ordered for a tender firm nodule to her left upper arm and referral was made to plastics.  Patient had ultrasound of the left upper extremity yesterday, 11/26/2023.  It appears that ultrasound has not been read yet.  Today, patient reports she is doing well.  She states that she has had a bump on her left arm for about the past year.  She states that it is somewhat tender especially when she touches it and reports that she sometimes has a burning sensation to the area.  She denies the area draining at all.  She states that it has not really gotten bigger in size.  She denies any trauma to the area.  She states that an ultrasound of the area was ordered in the past, but they were unable to find the lesion.  She states that she had an ultrasound yesterday, but the results still have not been released.  Patient states that she is not a smoker.  She denies any history of cardiac disease.  Patient does report that she is diabetic and her most recent A1c was around 8.  Review of Systems General: Denies any fevers or chills  Physical Exam    11/27/2023    9:38 AM 11/23/2023   12:07 PM 11/22/2023   10:51 AM  Vitals with BMI  Height 5' 5  5' 5  Weight 262 lbs 3 oz  265 lbs  BMI 43.63  44.1  Systolic 117  128  Diastolic 83  74  Pulse 85  82     Information is confidential and restricted. Go to Review  Flowsheets to unlock data.    General:  No acute distress,  Alert and oriented, Non-Toxic, Normal speech and affect On exam, patient is sitting upright in no acute distress.  To the left medial distal upper arm/just superior of the antecubital fossa, there is an approximately 3 cm x 2 cm well-defined soft mobile mass.  There are no overlying skin changes.  Slightly medial/posterior to this, there is a another mass that is approximately 3 x 4 cm and is mobile and soft.  There is a little bit of tenderness to palpation to this area.  There are no overlying skin changes.  Assessment/Plan  Arm mass, left   I discussed with the patient that I will talk with one of our surgeons to discuss excision.  We did discuss the possibility of excision in the operating room.  She expressed understanding and states that she would prefer to be knocked out.  I also discussed with her that we will also have to wait on the results of the ultrasound.  She expressed understanding.  Will plan to do a telephone visit next week to further discuss the next steps.  I also did  discussed with the patient that her  history of diabetes may put her at increased risk of wound healing problems.  She expressed understanding.  Pictures were obtained of the patient and placed in the chart with the patient's or guardian's permission.   ADDEND: I spoke with Dr. Waddell in regards to moving forward with excision of the masses.  He is okay with proceeding with the procedure in the operating room.  Lori Shea 11/27/2023, 10:24 AM

## 2023-11-27 NOTE — Addendum Note (Signed)
 Addended by: ANDRIS STAGGER on: 11/27/2023 01:47 PM   Modules accepted: Orders

## 2023-11-30 LAB — FECAL FAT, QUALITATIVE
Fat Qual Neutral, Stl: NORMAL
Fat Qual Total, Stl: NORMAL

## 2023-12-01 ENCOUNTER — Ambulatory Visit: Payer: Self-pay | Admitting: Gastroenterology

## 2023-12-02 ENCOUNTER — Ambulatory Visit (INDEPENDENT_AMBULATORY_CARE_PROVIDER_SITE_OTHER): Admitting: Student

## 2023-12-02 DIAGNOSIS — R2232 Localized swelling, mass and lump, left upper limb: Secondary | ICD-10-CM

## 2023-12-02 NOTE — Progress Notes (Signed)
 Patient is a 48 year old female who presents for further follow-up of her left upper extremity lesion via telephone visit.  Patient today reports she is doing well.  I discussed with the patient that I discussed with Dr. Waddell and we will go ahead and move forward with excision in the operating room.  I discussed with the patient that per her chart review, ultrasound results have not been read yet.  I discussed with her that we can go over these once they have been read.  She expressed understanding.  All of the patient's questions were answered to her satisfaction.

## 2023-12-05 LAB — PANCREATIC ELASTASE, FECAL: Pancreatic Elastase-1, Stool: 16 ug/g — ABNORMAL LOW (ref >200)

## 2023-12-06 ENCOUNTER — Other Ambulatory Visit: Payer: Self-pay

## 2023-12-06 DIAGNOSIS — R948 Abnormal results of function studies of other organs and systems: Secondary | ICD-10-CM

## 2023-12-10 ENCOUNTER — Other Ambulatory Visit: Payer: Self-pay | Admitting: Nurse Practitioner

## 2023-12-10 DIAGNOSIS — I10 Essential (primary) hypertension: Secondary | ICD-10-CM

## 2023-12-12 ENCOUNTER — Ambulatory Visit: Payer: Self-pay | Admitting: Physician Assistant

## 2023-12-17 ENCOUNTER — Other Ambulatory Visit: Payer: Self-pay | Admitting: Nurse Practitioner

## 2023-12-17 ENCOUNTER — Other Ambulatory Visit: Payer: Self-pay | Admitting: Gastroenterology

## 2023-12-17 ENCOUNTER — Other Ambulatory Visit (HOSPITAL_COMMUNITY): Payer: Self-pay

## 2023-12-17 DIAGNOSIS — K219 Gastro-esophageal reflux disease without esophagitis: Secondary | ICD-10-CM

## 2023-12-17 DIAGNOSIS — E119 Type 2 diabetes mellitus without complications: Secondary | ICD-10-CM

## 2023-12-17 DIAGNOSIS — M79622 Pain in left upper arm: Secondary | ICD-10-CM

## 2023-12-17 DIAGNOSIS — G4700 Insomnia, unspecified: Secondary | ICD-10-CM

## 2023-12-18 ENCOUNTER — Ambulatory Visit (HOSPITAL_COMMUNITY)
Admission: RE | Admit: 2023-12-18 | Discharge: 2023-12-18 | Disposition: A | Discharge status: Home / Self Care | Source: Ambulatory Visit | Attending: Gastroenterology | Admitting: Gastroenterology

## 2023-12-18 ENCOUNTER — Telehealth: Admitting: Physician Assistant

## 2023-12-18 DIAGNOSIS — R948 Abnormal results of function studies of other organs and systems: Secondary | ICD-10-CM | POA: Diagnosis present

## 2023-12-18 DIAGNOSIS — J329 Chronic sinusitis, unspecified: Secondary | ICD-10-CM | POA: Diagnosis not present

## 2023-12-18 MED ORDER — IOHEXOL 300 MG/ML  SOLN
100.0000 mL | Freq: Once | INTRAMUSCULAR | Status: AC | PRN
  Administered 2023-12-18: 100 mL via INTRAVENOUS

## 2023-12-18 MED ORDER — MOMETASONE FUROATE 50 MCG/ACT NA SUSP: 2.0000 | Freq: Every day | NASAL | 12 refills | Status: AC

## 2023-12-18 MED ORDER — AMOXICILLIN-POT CLAVULANATE 875-125 MG PO TABS: 1.0000 | ORAL_TABLET | Freq: Two times a day (BID) | ORAL | 0 refills | Status: AC

## 2023-12-18 NOTE — Progress Notes (Signed)
 Lori Shea, Lori Shea are scheduled for a virtual visit with your provider today.    Just as we do with appointments in the office, we must obtain your consent to participate.  Your consent will be active for this visit and any virtual visit you may have with one of our providers in the next 365 days.    If you have a MyChart account, I can also send a copy of this consent to you electronically.  All virtual visits are billed to your insurance company just like a traditional visit in the office.  As this is a virtual visit, video technology does not allow for your provider to perform a traditional examination.  This may limit your provider's ability to fully assess your condition.  If your provider identifies any concerns that need to be evaluated in person or the need to arrange testing such as labs, EKG, etc, we will make arrangements to do so.    Although advances in technology are sophisticated, we cannot ensure that it will always work on either your end or our end.  If the connection with a video visit is poor, we may have to switch to a telephone visit.  With either a video or telephone visit, we are not always able to ensure that we have a secure connection.   I need to obtain your verbal consent now.   Are you willing to proceed with your visit today?   Lori Shea has provided verbal consent on 12/18/2023 for a virtual visit (video or telephone).   Lori Shea, [place] 12/18/2023  2:06 PM   Date:  12/18/2023   ID:  Comer JINNY Wilfred, DOB 03-Nov-1975, MRN [id number]  Patient Location: Home Provider Location: Home Office   Participants: Patient and Provider for Visit and Wrap up  Method of visit: Video  Location of Patient: Home Location of Provider: Home Office Consent was obtain for visit over the video. Services rendered by provider: Visit was performed via video  A video enabled telemedicine application was used and I verified that I am speaking with the correct person using two  identifiers.  PCP:  Oley Bascom GORMAN, NP   Chief Complaint:  duana  History of Present Illness:    SHATIQUA HEROUX is a 48 y.o. female with history as stated below. Presents video telehealth for an acute care visit  She reports uri sxs for the last 3 weeks. She started with sore throat, bilat ear pain which then led to rhinorrhea, congestion, cough. The rhinorrhea and congestion have not improved and she has had sinus pressure/discomfort. She reports she has had elevated blood sugars with this as well.   Had intermittent fevers for the first two weeks. She had a negative covid test. Denies nv. Denies chest pain or shortness of breath.  Past Medical, Surgical, Social History, Allergies, and Medications have been Reviewed.  Past Medical History:  Diagnosis Date   Allergy    Foods:  mango, honey, corn.  Allergic to everything tested  15 years ago in [place]   Anemia    with pregnancy   Anxiety    Arthritis    Shoulder and hips   Asthma age 68 yo   Triggers:  cold weather, exercise, allergens, anxiety, URIs   Cholelithiasis age 49 yo   Asymptomatic   Closed avulsion fracture of lateral malleolus of right fibula 12/27/2016   COVID-19    Diabetes mellitus type 2 2012   Was diagnosed 2 years earlier with PCOS  Elevated liver enzymes 09/13/2016   none since 2018   GERD (gastroesophageal reflux disease)    Heart murmur    with pregnancy 20 yrs ago per pt on 10-12-2021   History of kidney stones    Hypertension 2015   Kidney stones    family history of renal failure   Migraines 2009   Morbid obesity (HCC)    PCOS (polycystic ovarian syndrome) 2010   Pneumonia    several years ago   Wears glasses     No outpatient medications have been marked as taking for the 12/18/23 encounter (Video Visit) with [facility] PROVIDER.     Allergies:   Corn-containing products, Honey, Mango flavoring agent (non-screening), Glipizide , Liraglutide, and Semaglutide    ROS See HPI for  history of present illness.  Physical Exam Constitutional:      Appearance: Normal appearance.  Pulmonary:     Effort: Pulmonary effort is normal.  Neurological:     Mental Status: She is alert.               MDM: Pt with URI sxs x3 weeks. Suspect bacterial sinusitis. Will tx with augmentin  and fluticasone . Regarding BS, no NV or other sxs to suggest DKA. Likely secondary to current infection. Advised close monitoring and if no improvement after starting meds or any worsening of symptoms then she needs an in person visit   Tests Ordered: No orders of the defined types were placed in this encounter.   Medication Changes: No orders of the defined types were placed in this encounter.    Disposition:  Follow up  Signed, Lori GORMAN Snuffer, PA-C  12/18/2023 2:06 PM

## 2023-12-18 NOTE — Patient Instructions (Signed)
 Shea Lori Jubilee, thank you for joining Lori GORMAN Snuffer, PA-C for today's virtual visit.  While this provider is not your primary care provider (PCP), if your PCP is located in our provider database this encounter information will be shared with them immediately following your visit.   A [facility] MyChart account gives you access to today's visit and all your visits, tests, and labs performed at [facility]  click here if you don't have a [facility] MyChart account or go to [email or web address]  Consent: (Patient) ELLIANAH Shea provided verbal consent for this virtual visit at the beginning of the encounter.  Current Medications:  Current Outpatient Medications:    albuterol  (PROVENTIL ) (2.5 MG/3ML) 0.083% nebulizer solution, USE 1 VIAL VIA NEBULIZER EVERY 6 HOURS AS NEEDED FOR WHEEZING OR SHORTNESS OF BREATH, Disp: 75 mL, Rfl: 6   albuterol  (VENTOLIN  HFA) 108 (90 Base) MCG/ACT inhaler, INHALE 2 PUFFS BY MOUTH INTO THE LUNGS EVERY 4 HOURS AS NEEDED FOR WHEEZING OR SHORTNESS OF BREATH, Disp: 6.7 g, Rfl: 2   amitriptyline  (ELAVIL ) 25 MG tablet, TAKE 1 TABLET BY MOUTH AT BEDTIME, Disp: 30 tablet, Rfl: 0   Bacillus Coagulans-Inulin (PROBIOTIC-PREBIOTIC PO), Take 1 capsule by mouth daily., Disp: , Rfl:    benzonatate  (TESSALON ) 100 MG capsule, Take 1 capsule (100 mg total) by mouth every 8 (eight) hours., Disp: 21 capsule, Rfl: 0   cetirizine  (ZYRTEC ) 10 MG tablet, TAKE 1 TABLET BY MOUTH EVERY DAY, Disp: 30 tablet, Rfl: 11   cholecalciferol (VITAMIN D3) 25 MCG (1000 UNIT) tablet, Take 1,000 Units by mouth daily., Disp: , Rfl:    cholestyramine  (QUESTRAN ) 4 g packet, Take 1 packet (4 g total) by mouth 2 (two) times daily., Disp: 60 packet, Rfl: 0   Continuous Glucose Sensor (FREESTYLE LIBRE 3 PLUS SENSOR) MISC, Change sensor every 15 days., Disp: 6 each, Rfl: 3   ELDERBERRY PO, Take 1 tablet by mouth daily. She does not  know the strength, Disp: , Rfl:    empagliflozin   (JARDIANCE ) 25 MG TABS tablet, Take 1 tablet (25 mg total) by mouth daily., Disp: 90 tablet, Rfl: 1   famotidine  (PEPCID ) 20 MG tablet, Take 1 tablet (20 mg total) by mouth 2 (two) times daily., Disp: 60 tablet, Rfl: 2   fluconazole  (DIFLUCAN ) 150 MG tablet, Take 1 tablet (150 mg total) by mouth every 3 (three) days., Disp: 2 tablet, Rfl: 0   fluticasone -salmeterol (ADVAIR DISKUS) 100-50 MCG/ACT AEPB, Inhale 1 puff into the lungs 2 (two) times daily., Disp: 60 each, Rfl: 0   insulin  glargine, 2 Unit Dial , (TOUJEO  MAX SOLOSTAR) 300 UNIT/ML Solostar Pen, Inject 90 Units into the skin daily. May titrate up to 120 units daily if instructed by your provider., Disp: 12 mL, Rfl: 11   insulin  lispro (HUMALOG ) 100 UNIT/ML KwikPen, INJECT 16-22 UNITS into THE SKIN 3 TIMES DAILY BEFORE MEALS, Disp: 30 mL, Rfl: 1   Insulin  Pen Needle (PEN NEEDLES) 31G X 8 MM MISC, Use to inject insulin  4 times daily, Disp: 200 each, Rfl: 3   JANUVIA  100 MG tablet, TAKE 1 TABLET BY MOUTH EVERY DAY, Disp: 90 tablet, Rfl: 1   lactobacillus acidophilus (BACID) TABS tablet, Take 1 tablet by mouth daily., Disp: , Rfl:    meloxicam  (MOBIC ) 15 MG tablet, Take 1 tablet (15 mg total) by mouth daily as needed for pain., Disp: 30 tablet, Rfl: 0   metFORMIN  (GLUCOPHAGE ) 500 MG tablet, TAKE 1 TABLET BY MOUTH 2 TIMES DAILY WITH A meal,  Disp: 180 tablet, Rfl: 3   montelukast  (SINGULAIR ) 10 MG tablet, TAKE 1 TABLET BY MOUTH EVERY DAY, Disp: 30 tablet, Rfl: 0   Multiple Vitamin (MULTIVITAMIN WITH MINERALS) TABS tablet, Take 1 tablet by mouth daily., Disp: , Rfl:    olmesartan -hydrochlorothiazide (BENICAR  HCT) 40-25 MG tablet, TAKE 1 TABLET BY MOUTH EVERY DAY, Disp: 90 tablet, Rfl: 0   pantoprazole  (PROTONIX ) 40 MG tablet, TAKE 1 TABLET BY MOUTH 2 TIMES DAILY BEFORE A MEAL, Disp: 60 tablet, Rfl: 1   rizatriptan  (MAXALT -MLT) 10 MG disintegrating tablet, Take 1 tablet (10 mg total) by mouth as needed for migraine. May repeat in 2 hours if needed,  Disp: 12 tablet, Rfl: 0   rosuvastatin  (CRESTOR ) 10 MG tablet, Take 1 tablet (10 mg total) by mouth daily., Disp: 90 tablet, Rfl: 3   terconazole  (TERAZOL 7 ) 0.4 % vaginal cream, Place 1 applicator vaginally at bedtime. Use for seven days, Disp: 45 g, Rfl: 0   TURMERIC PO, Take 1 capsule by mouth daily. She does not know the strength, Disp: , Rfl:    Medications ordered in this encounter:  No orders of the defined types were placed in this encounter.    *If you need refills on other medications prior to your next appointment, please contact your pharmacy*  Follow-Up: Call back or seek an in-person evaluation if the symptoms worsen or if the condition fails to improve as anticipated.  [facility] [phone]  Other Instructions You were given a prescription for antibiotics and nasal spray. Please take the antibiotic prescription fully.   Follow up with your regular doctor in 1 week for reassessment and seek care sooner if your symptoms worsen or fail to improve.    If you have been instructed to have an in-person evaluation today at a local Urgent Care facility, please use the link below. It will take you to a list of all of our available [facility], including address, phone number and hours of operation. Please do not delay care.  [facility]  If you or a family member do not have a primary care provider, use the link below to schedule a visit and establish care. When you choose a [facility] primary care physician or advanced practice provider, you gain a long-term partner in health. Find a Primary Care Provider  Learn more about [facility] in-office and virtual care options: [facility] - Get Care Now

## 2023-12-20 ENCOUNTER — Other Ambulatory Visit (HOSPITAL_COMMUNITY): Payer: Self-pay

## 2023-12-20 ENCOUNTER — Other Ambulatory Visit: Payer: Self-pay

## 2023-12-20 ENCOUNTER — Encounter: Payer: Self-pay | Admitting: Gastroenterology

## 2023-12-20 DIAGNOSIS — E1165 Type 2 diabetes mellitus with hyperglycemia: Secondary | ICD-10-CM

## 2023-12-20 NOTE — Progress Notes (Signed)
 12/20/2023 Name: Lori Shea MRN: [id number] DOB: 1975-05-31  Chief Complaint  Patient presents with   Diabetes    Lori Shea is a 48 y.o. year old female who presented for a telephone visit.   They were referred to the pharmacist by their PCP for assistance in managing diabetes. PMH includes HTN, gastroparesis, cholelithiasis with chronic cholecystitis, MASL, GERD, T2DM, PCOS, BMI > 40.   Subjective: Patient was last seen by PCP, Lori Borer, NP on 10/28/23. A1C increased from 8.2% to 8.6%. LDL-C decreased from 116 mg/dL to 40 mg/dL after restarting rosuvastatin  10 mg daily. She was engaged by pharmacy via telephone on 09/30/23. At this appointment she was instructed to switch from Lantus  to Toujeo  U-300. She was also instructed to continue Humalog  5 units with breakfast and dinner and increase to 8 units with dinner, with instructions to continue titrating dinner insulin  dose up to a maximum of 20 units. At last pharmacy telephone appt on 11/25/23, patient reported that she successfully switched to Toujeo  U-300, but when she tried to increase her Humalog  dose at meals she felt it made her BG spike more than usual and stay elevated overnight. States her sugars returned to baseline when she decreased back to Humalog  5 units with her meals. A referral was placed for her to schedule with the endocrinology office in [place] rather than [place]. She also reported she had been dealing with a UTI that could have been contributing to higher sugars. She was instructed to titrate her basal insulin  up to a maximum of 90 units nightly. Appears since then, she has been seen for ongoing URI and was recently prescribed a course of Augmentin .   Today, patient reports doing ok. She just started Augmentin  as prescribed for URI and she immediately noticed that her BG have improved. This is reflected in her FL3+ AGP over the past 2 days. She  reports that she did initially increase her Toujeo , but then  forgot and started taking 86 units again. She was recently seen by GI who recommended trialing Creon with meals, as they suspect she has low pancreatitic enzymes. She states she did not pick up the samples yet, but will contact GI to see if she should start taking these now.   Care Team: Primary Care Provider: Borer Lori RAMAN, NP ; Next Scheduled Visit: 01/29/24 Endocrinology: Benton Rio, NP; Patient cancelled, would like to schedule with [facility] Gastro[facility]ology: Lori Finner, MD: Scheduled 11/22/23  Medication Access/Adherence  Current Pharmacy:  [facility] - [place], [place] - [address] [place] [address] Phone: [phone] Fax: [phone]  [id number] [place], [place] - [address] AT [facility] [address] [facility] [address] [place] [address] Phone: [phone] Fax: [phone]   Patient reports affordability concerns with their medications: No  Patient reports access/transportation concerns to their pharmacy: No  Patient reports adherence concerns with their medications:  No     Diabetes:  Current medications: Jardiance  25 mg daily, Toujeo  U300 86 units once daily at bedtime (reports she is still taking 80 units daily, forgot to continue increased dose), Humalog  16-22 units TID (patient takes 5 units with meals - typically takes with her first bite), Januvia  100 mg daily, metformin  IR 500 mg BID  Medications tried in the past: metformin  1000 mg BID - GI AE, glipizide  - yeast infection, liraglutide - n/v, Rybelsus  - n/v and gastroparesis  Denies UTI/yeast infections recently  with Jardiance . Has been having lower back pain - but had recent urinalysis without s/sx of infection.   Current glucose readings:  Using Freestyle Libre 3 Plus CGM - sensors have been working well since our last appt  Date of Download: 12/07/23- 12/20/23 % Time CGM is active: 95% Average Glucose: 216 mg/dL Glucose  Management Indicator: 8.5 Glucose Variability: 23.6 (goal <36%) Time in Goal:  - Time in range 70-180: 25% - Time above range: 75% (23% very high) > compared to 9% very high last time - Time below range: 0% Observed patterns: Patient feels spikes are related to ongoing URI illness       Patient denies hypoglycemic s/sx including dizziness, shakiness, sweating. Patient reports hyperglycemic symptoms including irritability, anxious. Denies polyuria, polydipsia, polyphagia, nocturia, neuropathy, blurred vision.  Current meal patterns: Has issues with digestion if she eats too much meat - has not initiated samples of Creon recommended by GI yet - Breakfast: protein shake with a fruit (apple, banana) - usually is not hungry in the morning - Lunch: healthy lunch available at work - protein with vegetables - Supper - eats dinner around 5-5:30PM. Has a protein, starch (1/2 cup of rice, mashed potatoes), vegetables. Does usually have something sweet.  - Snacks - none - Drinks - drinking gallon of water daily  Current physical activity: Walking 2.5 miles daily  Current medication access support: none  Hypertension:  Current medications: olmesartan -hydrochlorothiazide 40-25 mg daily  Hyperlipidemia/ASCVD Risk Reduction  Current lipid lowering medications: rosuvastatin  10 mg daily Medications tried in the past: simvastatin  - d/c in the setting of drug recall  Clinical ASCVD: No  The ASCVD Risk score (Arnett DK, et al., 2019) failed to calculate for the following reasons:   The valid total cholesterol range is 130 to 320 mg/dL    Objective:  BP Readings from Last 3 Encounters:  11/27/23 117/83  11/23/23 114/80  11/22/23 128/74    Lab Results  Component Value Date   HGBA1C 8.6 (H) 10/28/2023    Lab Results  Component Value Date   CREATININE 0.90 07/26/2023   BUN 16 07/26/2023   NA 141 07/26/2023   K 4.1 07/26/2023   CL 102 07/26/2023   CO2 24 07/26/2023    Lab Results   Component Value Date   CHOL 113 10/28/2023   HDL 35 (L) 10/28/2023   LDLCALC 40 10/28/2023   TRIG 244 (H) 10/28/2023   CHOLHDL 3.2 10/28/2023    Medications Reviewed Today     Reviewed by Brinda Lorain SQUIBB, RPH (Pharmacist) on 12/20/23 at 1357  Med List Status: <None>   Medication Order Taking? Sig Documenting Provider Last Dose Status Informant  albuterol  (PROVENTIL ) (2.5 MG/3ML) 0.083% nebulizer solution [id number]  USE 1 VIAL VIA NEBULIZER EVERY 6 HOURS AS NEEDED FOR WHEEZING OR SHORTNESS OF SHERIDA Oley Lori GORMAN, NP  Active   albuterol  (VENTOLIN  HFA) 108 (90 Base) MCG/ACT inhaler [id number]  INHALE 2 PUFFS BY MOUTH INTO THE LUNGS EVERY 4 HOURS AS NEEDED FOR WHEEZING OR SHORTNESS OF BREATH Nichols, Tonya S, NP  Active   amitriptyline  (ELAVIL ) 25 MG tablet [id number] Yes TAKE 1 TABLET BY MOUTH AT BEDTIME Nichols, Tonya S, NP  Active   amoxicillin -clavulanate (AUGMENTIN ) 875-125 MG tablet [id number] Yes Take 1 tablet by mouth 2 (two) times daily for 7 days. Couture, Cortni S, PA-C  Active   Bacillus Coagulans-Inulin (PROBIOTIC-PREBIOTIC PO) [id number]  Take 1 capsule by mouth daily. [provider]  Active   benzonatate  (TESSALON ) 100 MG  capsule [id number]  Take 1 capsule (100 mg total) by mouth every 8 (eight) hours. Enedelia Dorna HERO, FNP  Active   cetirizine  (ZYRTEC ) 10 MG tablet [id number]  TAKE 1 TABLET BY MOUTH EVERY DAY Nichols, Tonya S, NP  Active   cholecalciferol (VITAMIN D3) 25 MCG (1000 UNIT) tablet [id number]  Take 1,000 Units by mouth daily. [provider]  Active   cholestyramine  (QUESTRAN ) 4 g packet [id number] Yes Take 1 packet (4 g total) by mouth 2 (two) times daily. Mansouraty, Lori Raddle., MD  Active            Med Note MAYER, [facility] R   Fri Jul 05, 2023 11:49 AM) Taking differently: 1 packet PRN   Continuous Glucose Sensor (FREESTYLE LIBRE 3 PLUS SENSOR) MISC [id number] Yes Change sensor every 15 days. Therisa Benton PARAS, NP  Active   ELDERBERRY PO  [id number]  Take 1 tablet by mouth daily. She does not  know the strength [provider]  Active   empagliflozin  (JARDIANCE ) 25 MG TABS tablet [id number] Yes Take 1 tablet (25 mg total) by mouth daily. Therisa Benton PARAS, NP  Active   famotidine  (PEPCID ) 20 MG tablet [id number]  Take 1 tablet (20 mg total) by mouth 2 (two) times daily. Oley Lori RAMAN, NP  Active     Discontinued 12/20/23 1356 (Completed Course)   fluticasone -salmeterol (ADVAIR DISKUS) 100-50 MCG/ACT AEPB [id number]  Inhale 1 puff into the lungs 2 (two) times daily. Oley Lori RAMAN, NP  Active   insulin  glargine, 2 Unit Dial , (TOUJEO  MAX SOLOSTAR) 300 UNIT/ML Solostar Pen [id number] Yes Inject 90 Units into the skin daily. May titrate up to 120 units daily if instructed by your provider. Oley Lori RAMAN, NP  Active   insulin  lispro (HUMALOG ) 100 UNIT/ML KwikPen [id number] Yes INJECT 16-22 UNITS into THE SKIN 3 TIMES DAILY BEFORE MEALS Reardon, Benton PARAS, NP  Active            Med Note MAYER, ASAJAH R   Fri Jul 05, 2023 11:52 AM) Taking differently: 5 units TID  Insulin  Pen Needle (PEN NEEDLES) 31G X 8 MM MISC [id number]  Use to inject insulin  4 times daily Therisa Benton PARAS, NP  Active   JANUVIA  100 MG tablet [id number] Yes TAKE 1 TABLET BY MOUTH EVERY DAY Therisa Benton PARAS, NP  Active   lactobacillus acidophilus (BACID) TABS tablet [id number]  Take 1 tablet by mouth daily. [provider]  Active   meloxicam  (MOBIC ) 15 MG tablet [id number]  Take 1 tablet (15 mg total) by mouth daily as needed for pain. Oley Lori RAMAN, NP  Active   metFORMIN  (GLUCOPHAGE ) 500 MG tablet [id number] Yes TAKE 1 TABLET BY MOUTH 2 TIMES DAILY WITH A meal Therisa Benton PARAS, NP  Active   mometasone  (NASONEX ) 50 MCG/ACT nasal spray [id number]  Place 2 sprays into the nose daily. Couture, Cortni S, PA-C  Active   montelukast  (SINGULAIR ) 10 MG tablet [id number] Yes TAKE 1 TABLET BY MOUTH EVERY DAY Nichols, Tonya S, NP  Active   Multiple Vitamin  (MULTIVITAMIN WITH MINERALS) TABS tablet [id number]  Take 1 tablet by mouth daily. [provider]  Active   olmesartan -hydrochlorothiazide (BENICAR  HCT) 40-25 MG tablet [id number] Yes TAKE 1 TABLET BY MOUTH EVERY DAY Nichols, Tonya S, NP  Active   pantoprazole  (PROTONIX ) 40 MG tablet [id number] Yes TAKE 1 TABLET BY MOUTH 2 TIMES DAILY BEFORE A MEAL Mansouraty, Gabriel Jr., MD  Active   rizatriptan  (MAXALT -MLT) 10 MG disintegrating  tablet [id number]  Take 1 tablet (10 mg total) by mouth as needed for migraine. May repeat in 2 hours if needed Oley Lori RAMAN, NP  Active   rosuvastatin  (CRESTOR ) 10 MG tablet [id number] Yes Take 1 tablet (10 mg total) by mouth daily. Oley Lori RAMAN, NP  Active   terconazole  (TERAZOL 7 ) 0.4 % vaginal cream [id number]  Place 1 applicator vaginally at bedtime. Use for seven days Ajewole, Christana, MD  Active   TURMERIC PO [id number]  Take 1 capsule by mouth daily. She does not know the strength [provider]  Active   Med List Note Maria Camelia HERO, [place] 12/23/19 1206):                Assessment/Plan:   Diabetes: - Currently uncontrolled with last A1C of 8.6% above goal < 7%, but improved from 9.3%. Glycemic control per AGP largely unchanged from previous despite transitioning to concentrated insulin . Recurrent infections likely leading to worsened glycemic control. Patient did not increase basal insulin  as instructed previously, so will proceed with dose increase today as previously recommended. Inexplicably, patient reports that increasing her dose of Humalog  to 10 units (instead of 5 units) after dinner increased her BG overnight. This trend was reflected on her AGP after our last appt. She noted the same phenomenon with Novolog  vs Humalog . Discussed with her that we typically target at 50/50 or 40/60 split between basal/bolus insulin . If recommended increase in basal insulin  does not improve glycemic control, can consider transitioning from  Humalog  to Lyumjev  (ultra rapid acting insulin  lispro) to see if this improves efficacy. Also recommended that patient schedule with endocrinology to potentially discuss transitioning to U500 insulin  given significant insulin  resistance.  - Reviewed long term cardiovascular and renal outcomes of uncontrolled blood sugar - Reviewed goal A1c, goal fasting, and goal 2 hour post prandial glucose - Reviewed dietary modifications including controlling portion size of carbohydrate heavy foods. Commended for excellent adherence to healthy diet.  - Reviewed lifestyle modifications including: continuing regular physical activity. Congratulated on progress with walking 2.5 miles daily.  - Recommend to continue Jardiance  25 mg daily, Januvia  100 mg daily, metformin  IR 500 mg BID  - Recommend to increase to Toujeo  U300 86 units nightly, if no change in BG after 3 days, titrate to 90 units nightly. Patient using $35 copay card for Toujeo .  - Recommend to continue Humalog  5 units TID with meals. Consider transitioning to Lyumjev  at follow-up if further increase in basal insulin  fails to result in improved glycemic control. - Provided patient with information to schedule with endocrinology office in [place].  - Patient denies personal or family history of multiple endocrine neoplasia type 2, medullary thyroid cancer; personal history of pancreatitis or gallbladder disease. - Recommend to check glucose continuously with FL3+ CGM.    Hypertension: - Currently controlled - Reviewed long term cardiovascular and renal outcomes of uncontrolled blood pressure - Recommend to continue olmesartan -hydrochlorothiazide 40-25 mg daily    Hyperlipidemia/ASCVD Risk Reduction: - Currently controlled with last LDL-C of 40 mg/dL below goal < 70 mg/dL given U7IF with comorbidities - Reviewed long term complications of uncontrolled cholesterol - Recommend to continue rosuvastatin  10 mg daily    Follow Up Plan: PCP 01/29/24,  PharmD telephone 12/20/23, Endo needs to be scheduled  Lorain Baseman, PharmD [facility] [phone]

## 2023-12-30 ENCOUNTER — Other Ambulatory Visit: Payer: Self-pay | Admitting: Nurse Practitioner

## 2023-12-30 DIAGNOSIS — M79622 Pain in left upper arm: Secondary | ICD-10-CM

## 2023-12-30 DIAGNOSIS — J4530 Mild persistent asthma, uncomplicated: Secondary | ICD-10-CM

## 2023-12-30 DIAGNOSIS — I10 Essential (primary) hypertension: Secondary | ICD-10-CM

## 2023-12-30 DIAGNOSIS — G4700 Insomnia, unspecified: Secondary | ICD-10-CM

## 2023-12-31 ENCOUNTER — Ambulatory Visit: Payer: Self-pay | Admitting: Gastroenterology

## 2024-01-03 ENCOUNTER — Encounter: Payer: Self-pay | Admitting: Gastroenterology

## 2024-01-04 NOTE — Telephone Encounter (Signed)
 Please let the patient know that, this is not a side effect profile that we would expect from the enzymes themselves.  Even if the initial trials the amount of hyperglycemia that was noted in patients who were on Creon with similar to those who were on placebo. With that being said, our bodies are amazing things. I would see if she can continue to try to use the medication for at least the next week and monitor her blood sugars that she is doing. If she continues to have issues then we will likely have to consider decreasing her dose. Ask her what her current dosing is and maybe we can start trying to just decrease that if she continues to have issues after next week. If she feels that her blood sugars are getting too high and she cannot get controlled, then she may stop the medicine and monitor her blood sugars. Thanks. GM

## 2024-01-06 ENCOUNTER — Other Ambulatory Visit: Payer: Self-pay | Admitting: Nurse Practitioner

## 2024-01-06 NOTE — Telephone Encounter (Signed)
 Lets see how she does this week, and as I documented, if she continues to have issues then we will consider decreasing the dose or stopping Creon if hyperglycemia continues to be an issue. GM

## 2024-01-15 ENCOUNTER — Other Ambulatory Visit: Payer: Self-pay

## 2024-01-15 ENCOUNTER — Ambulatory Visit: Payer: Self-pay

## 2024-01-15 MED ORDER — PANCRELIPASE (LIP-PROT-AMYL) 36000-114000 UNITS PO CPEP: ORAL_CAPSULE | ORAL | 11 refills | Status: AC

## 2024-01-15 NOTE — Telephone Encounter (Signed)
 I am glad to hear this. I would like for her to continue the current Creon  dosing in this case. Thank you. GM

## 2024-01-17 ENCOUNTER — Other Ambulatory Visit: Payer: Self-pay

## 2024-01-17 DIAGNOSIS — E1165 Type 2 diabetes mellitus with hyperglycemia: Secondary | ICD-10-CM

## 2024-01-17 NOTE — Progress Notes (Signed)
 01/17/2024 Name: Lori Shea MRN: [id number] DOB: 1976/02/21  Chief Complaint  Patient presents with   Diabetes    Lori Shea is a 48 y.o. year old female who presented for a telephone visit.   They were referred to the pharmacist by their PCP for assistance in managing diabetes. 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 A1C increased from 8.2% to 8.6%. LDL-C decreased from 116 mg/dL to 40 mg/dL after restarting rosuvastatin  10 mg daily. She was engaged by pharmacy via telephone on 09/30/23. At this appointment she was instructed to switch from Lantus  to Toujeo  U-300. She was also instructed to continue Humalog  5 units with breakfast and dinner and increase to 8 units with dinner, with instructions to continue titrating dinner insulin  dose up to a maximum of 20 units. At  pharmacy telephone appt on 11/25/23, patient reported that she successfully switched to Toujeo  U-300, but when she tried to increase her Humalog  dose at meals she felt it made her BG spike more than usual and stay elevated overnight. States her sugars returned to baseline when she decreased back to Humalog  5 units with her meals. A referral was placed for her to schedule with the endocrinology office in [place] rather than [place]. She also reported she had been dealing with a UTI that could have been contributing to higher sugars. She was instructed to titrate her basal insulin  up to a maximum of 90 units nightly. Appears since then, she has been seen for ongoing URI and was recently prescribed a course of Augmentin . At pharmacy telephone appt on 12/19/23, pt reported recent URI causing elevated BG. She had recently been seen by GI who recommended taking Creon  with meals for exocrine pancreatic insufficiency. She had not increased to 86 units of insulin   daily as previously instructed, so was instructed to increase as previously discusssed. She had a CT abd/pelvis on 12/18/23 that demonstrated hepatic steatosis and fatty atrophy of the pancreas with no inflammatory stranding or significant change compared to 2020.   Today, patient reports doing ok. The Creon  has helped her GI symptoms immensely. She had a stomach bug yesterday and had persistently elevated BG, but otherwise reports that the increase in basal insulin  helped control her sugars. She reports that she has continued Humalog  5 units with  meals, but occasionally, if BG remains elevated 2 hours after eating, she will give herself another 5 units. She has not scheduled with endo yet.  Care Team: Primary Care Provider: Borer Bascom RAMAN, NP ; Next Scheduled Visit: needs to reschedule Endocrinology: Shea Rio, NP; Patient cancelled, would like to schedule with [facility] Gastroenterology: Aloha Finner, MD: Scheduled 11/22/23  Medication Access/Adherence  Current Pharmacy:  [facility] - [place], [place] - [address] [place] [address] Phone: [phone] Fax: [phone]  [id number] [place], [place] - [address] AT [facility] [address] [facility] [address] [place] [address] Phone: [phone] Fax: [phone]   Patient reports affordability concerns with their medications: No  Patient reports access/transportation concerns to their pharmacy: No  Patient reports adherence concerns with their medications:  No     Diabetes:  Current medications: Jardiance  25 mg daily, Toujeo  U300 86 units once daily at bedtime, Humalog  16-22 units TID (patient takes 5 units with meals -  typically takes with her first bite), Januvia  100 mg daily, metformin  IR 500 mg BID  Medications tried in the past: metformin  1000 mg BID - GI AE, glipizide  - yeast infection, liraglutide - n/v, Rybelsus  - n/v and gastroparesis  Denies  UTI/yeast infections recently with Jardiance . Reported that she did not take Jardiance  yesterday given nausea/vomiting.  Current glucose readings:  Using Freestyle Libre 3 Plus CGM - sensors have been working well since our last appt  Date of Download: 01/04/24 to 01/17/24       Patient denies hypoglycemic s/sx including dizziness, shakiness, sweating. Patient reports hyperglycemic symptoms including irritability, anxious. Denies polyuria, polydipsia, polyphagia, nocturia, neuropathy, blurred vision.  Current meal patterns: Digestion issues have improved since starting Creon . - Breakfast: protein shake with a fruit (apple, banana) - usually is not hungry in the morning - Lunch: healthy lunch available at work - protein with vegetables - Supper - eats dinner around 5-5:30PM. Has a protein, starch (1/2 cup of rice, mashed potatoes), vegetables. Does usually have something sweet.  - Snacks - none - Drinks - drinking gallon of water daily  Current physical activity: Walking 2.5 miles daily  Current medication access support: none  Hypertension:  Current medications: olmesartan -hydrochlorothiazide 40-25 mg daily  Hyperlipidemia/ASCVD Risk Reduction  Current lipid lowering medications: rosuvastatin  10 mg daily Medications tried in the past: simvastatin  - d/c in the setting of drug recall  Clinical ASCVD: No  The ASCVD Risk score (Arnett DK, et al., 2019) failed to calculate for the following reasons:   The valid total cholesterol range is 130 to 320 mg/dL    Objective:  BP Readings from Last 3 Encounters:  11/27/23 117/83  11/23/23 114/80  11/22/23 128/74    Lab Results  Component Value Date   HGBA1C 8.6 (H) 10/28/2023    Lab Results  Component Value Date   CREATININE 0.90 07/26/2023   BUN 16 07/26/2023   NA 141 07/26/2023   K 4.1 07/26/2023   CL 102 07/26/2023   CO2 24 07/26/2023    Lab Results  Component Value Date   CHOL 113 10/28/2023   HDL 35 (L)  10/28/2023   LDLCALC 40 10/28/2023   TRIG 244 (H) 10/28/2023   CHOLHDL 3.2 10/28/2023    EXAM: NUCLEAR MEDICINE GASTRIC EMPTYING SCAN - August 2024   TECHNIQUE: After oral ingestion of radiolabeled meal, sequential abdominal images were obtained for 4 hours. Percentage of activity emptying the stomach was calculated at 1 hour, 2 hour, 3 hour, and 4 hours.   RADIOPHARMACEUTICALS:  2.1 mCi Tc-45m sulfur  colloid in standardized meal   COMPARISON:  CT February 01, 2019   FINDINGS: Expected location of the stomach in the left upper quadrant. Ingested meal empties the stomach gradually over the course of the study.   0% emptied at 1 hr ( normal >= 10%)   4% emptied at 2 hr ( normal >= 40%)   7% emptied at 3 hr ( normal >= 70%)   18% emptied at 4 hr ( normal >= 90%)   IMPRESSION: Scintigraphic findings compatible with delayed gastric emptying  Medications Reviewed Today     Reviewed by Brinda Lorain SQUIBB, RPH (Pharmacist) on 01/17/24 at 1008  Med List Status: <None>   Medication Order Taking? Sig Documenting Provider Last Dose Status Informant  albuterol  (PROVENTIL ) (2.5 MG/3ML) 0.083% nebulizer solution [id number]  USE 1 VIAL VIA NEBULIZER EVERY 6 HOURS AS NEEDED FOR WHEEZING OR SHORTNESS OF BREATH Oley Bascom RAMAN, NP  Active  albuterol  (VENTOLIN  HFA) 108 (90 Base) MCG/ACT inhaler [id number]  INHALE 2 PUFFS BY MOUTH INTO THE LUNGS EVERY 4 HOURS AS NEEDED FOR WHEEZING OR SHORTNESS OF BREATH Nichols, Tonya S, NP  Active   amitriptyline  (ELAVIL ) 25 MG tablet [id number]  TAKE 1 TABLET BY MOUTH AT BEDTIME Nichols, Tonya S, NP  Active   Bacillus Coagulans-Inulin (PROBIOTIC-PREBIOTIC PO) [id number]  Take 1 capsule by mouth daily. [provider]  Active   benzonatate  (TESSALON ) 100 MG capsule [id number]  Take 1 capsule (100 mg total) by mouth every 8 (eight) hours. Enedelia Dorna HERO, FNP  Active   cetirizine  (ZYRTEC ) 10 MG tablet [id number]  TAKE 1 TABLET BY MOUTH EVERY DAY Nichols,  Tonya S, NP  Active   cholecalciferol (VITAMIN D3) 25 MCG (1000 UNIT) tablet [id number]  Take 1,000 Units by mouth daily. [provider]  Active   cholestyramine  (QUESTRAN ) 4 g packet [id number]  Take 1 packet (4 g total) by mouth 2 (two) times daily. Mansouraty, Aloha Raddle., MD  Active            Med Note MAYER, [facility] R   Fri Jul 05, 2023 11:49 AM) Taking differently: 1 packet PRN   Continuous Glucose Sensor (FREESTYLE LIBRE 3 PLUS SENSOR) MISC [id number] Yes Change sensor every 15 days. Therisa Shea PARAS, NP  Active   ELDERBERRY PO [id number]  Take 1 tablet by mouth daily. She does not  know the strength [provider]  Active   famotidine  (PEPCID ) 20 MG tablet [id number]  Take 1 tablet (20 mg total) by mouth 2 (two) times daily. Oley Bascom RAMAN, NP  Active   fluticasone -salmeterol (ADVAIR DISKUS) 100-50 MCG/ACT AEPB [id number]  Inhale 1 puff into the lungs 2 (two) times daily. Oley Bascom RAMAN, NP  Active   insulin  glargine, 2 Unit Dial , (TOUJEO  MAX SOLOSTAR) 300 UNIT/ML Solostar Pen [id number] Yes Inject 90 Units into the skin daily. May titrate up to 120 units daily if instructed by your provider. Oley Bascom RAMAN, NP  Active   insulin  lispro (HUMALOG ) 100 UNIT/ML KwikPen [id number] Yes INJECT 16-22 UNITS into THE SKIN 3 TIMES DAILY BEFORE MEALS Reardon, Shea PARAS, NP  Active            Med Note MAYER, ASAJAH R   Fri Jul 05, 2023 11:52 AM) Taking differently: 5 units TID  Insulin  Pen Needle (PEN NEEDLES) 31G X 8 MM MISC [id number]  Use to inject insulin  4 times daily Therisa Shea PARAS, NP  Active   JANUVIA  100 MG tablet [id number] Yes TAKE 1 TABLET BY MOUTH EVERY DAY Therisa Shea PARAS, NP  Active   JARDIANCE  25 MG TABS tablet [id number] Yes Take 1 tablet (25 mg total) by mouth daily. Therisa Shea PARAS, NP  Active   lactobacillus acidophilus (BACID) TABS tablet [id number]  Take 1 tablet by mouth daily. [provider]  Active   lipase/protease/amylase (CREON ) 36000  UNITS CPEP capsule [id number] Yes Take 2 capsules (72,000 Units total) by mouth 3 (three) times daily with meals. May also take 1 capsule (36,000 Units total) as needed (with snacks). Mansouraty, Aloha Raddle., MD  Active   meloxicam  (MOBIC ) 15 MG tablet [id number]  Take 1 tablet (15 mg total) by mouth daily as needed for pain. Oley Bascom RAMAN, NP  Active   metFORMIN  (GLUCOPHAGE ) 500 MG tablet [id number] Yes TAKE 1 TABLET BY MOUTH 2 TIMES DAILY WITH A meal Therisa Shea PARAS, NP  Active   mometasone  (NASONEX ) 50 MCG/ACT nasal spray [id number]  Place 2 sprays into the nose daily. Couture, Cortni S, PA-C  Active   montelukast  (SINGULAIR ) 10 MG tablet [id number]  TAKE 1 TABLET BY MOUTH EVERY DAY Nichols, Tonya S, NP  Active   Multiple Vitamin (MULTIVITAMIN WITH MINERALS) TABS tablet [id number]  Take 1 tablet by mouth daily. [provider]  Active   olmesartan -hydrochlorothiazide (BENICAR  HCT) 40-25 MG tablet [id number] Yes TAKE 1 TABLET BY MOUTH EVERY DAY Nichols, Tonya S, NP  Active   pantoprazole  (PROTONIX ) 40 MG tablet [id number]  TAKE 1 TABLET BY MOUTH 2 TIMES DAILY BEFORE A MEAL Mansouraty, Gabriel Jr., MD  Active   rizatriptan  (MAXALT -MLT) 10 MG disintegrating tablet [id number]  Take 1 tablet (10 mg total) by mouth as needed for migraine. May repeat in 2 hours if needed Oley Bascom RAMAN, NP  Active   rosuvastatin  (CRESTOR ) 10 MG tablet [id number] Yes Take 1 tablet (10 mg total) by mouth daily. Oley Bascom RAMAN, NP  Active   terconazole  (TERAZOL 7 ) 0.4 % vaginal cream [id number]  Place 1 applicator vaginally at bedtime. Use for seven days Ajewole, Christana, MD  Active   TURMERIC PO [id number]  Take 1 capsule by mouth daily. She does not know the strength [provider]  Active   Med List Note Maria Camelia HERO, [place] 12/23/19 1206):                Assessment/Plan:   Diabetes: - Currently uncontrolled with last A1C of 8.6% above goal < 7%, but improved from 9.3%. Glycemic control per  AGP slightly improved from previous with increase in Toujeo . Would recommend continued titration. Typically would target basal/bolus split of 50/50 or 40/60, but patient continues to report that if she injects > 5 units of prandial insulin  when she starts eating it causes BG to become more elevated. With hx of gastroparesis, will should consider initiation of extended or split bolus dosing with meals. Pt does not have evidence of pancreatitis on CT scan, so it is appropriate to continue DPP4i, though unsure how much glycemic benefit she is getting. Continued to emphasize that patient would benefit from appt with endocrinology to discuss transitioning to U500 insulin . Given evidence of insulin  resistance, will continue to titrate basal insulin .  - Reviewed long term cardiovascular and renal outcomes of uncontrolled blood sugar - Reviewed goal A1c, goal fasting, and goal 2 hour post prandial glucose - Reviewed dietary modifications including controlling portion size of carbohydrate heavy foods. Commended for excellent adherence to healthy diet.  - Reviewed lifestyle modifications including: continuing regular physical activity. Congratulated on progress with walking 2.5 miles daily.  - Recommend to continue Jardiance  25 mg daily, Januvia  100 mg daily, metformin  IR 500 mg BID  - Recommend to increase to Toujeo  U300 90 units nightly. Patient using $35 copay card for Toujeo .  - Recommend to continue Humalog  5 units TID with meals. Instructed patient that if she has a larger meal, she can inject an additional 5 units 1-2 hours after her meal if BG are still elevated due to hx of gastroparesis. - Reminded patient to schedule with endocrinology office in [place].  - Patient denies personal or family history of multiple endocrine neoplasia type 2, medullary thyroid cancer; personal history of pancreatitis or gallbladder disease. - Recommend to check glucose continuously with FL3+ CGM.    Hypertension: -  Currently controlled - Reviewed long term cardiovascular and renal outcomes of uncontrolled blood pressure - Recommend to continue olmesartan -hydrochlorothiazide 40-25 mg daily    Hyperlipidemia/ASCVD Risk Reduction: -  Currently controlled with last LDL-C of 40 mg/dL below goal < 70 mg/dL given U7IF with comorbidities - Reviewed long term complications of uncontrolled cholesterol - Recommend to continue rosuvastatin  10 mg daily    Follow Up Plan: PCP needs to reschedule, PharmD telephone 02/21/24, Endo needs to be scheduled  Lorain Baseman, PharmD [facility] [phone]

## 2024-01-22 ENCOUNTER — Ambulatory Visit (INDEPENDENT_AMBULATORY_CARE_PROVIDER_SITE_OTHER): Admitting: Student

## 2024-01-22 VITALS — BP 118/79 | HR 81 | Wt 260.0 lb

## 2024-01-22 DIAGNOSIS — R2232 Localized swelling, mass and lump, left upper limb: Secondary | ICD-10-CM

## 2024-01-22 NOTE — Progress Notes (Signed)
 Patient ID: Lori Shea, female    DOB: [birth year], 48 y.o.   MRN: [id number]  Chief Complaint  Patient presents with   Pre-op Exam      ICD-10-CM   1. Arm mass, left  R22.32 MR FOREARM LEFT W WO CONTRAST       History of Present Illness: Lori Shea is a 48 y.o.  female  with a history of mass to left upper extremity.  She presents for preoperative evaluation for upcoming procedure, excision of mass to the left upper extremity, scheduled for 02/03/2024 with Dr. Waddell.  The patient has not had problems with anesthesia.  Patient denies any history of cardiac disease.  She denies taking any blood thinners.  Patient reports she is not a smoker.  Patient denies taking any birth control or hormone replacement.  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  She denies any personal family history of blood clots or clotting diseases.  She denies any recent surgeries, traumas or infections.  She denies any history of stroke or heart attack.  She denies any history of Crohn's disease or ulcerative colitis.  She denies any history of COPD.  Patient does report she has asthma, but it is well-controlled.  She denies any history of cancer.  She denies any varicosities to her lower extremities.  She denies any recent fevers, chills or changes in her health.  Per chart review, patient had ultrasound completed on 11/26/2023.  Results showed: IMPRESSION: 1. Indeterminate 2.0 x 1.2 x 1.3 cm soft tissue nodular region in the medial elbow which is fairly homogeneous and isoechoic to surrounding subcutaneous tissues. Vascular flow is present in this region. This may represent a lipoma. 2. Indeterminate 3.7 x 0.8 x 2.2 cm isoechoic focal area in the subcutaneous tissues in the antecubital fossa, possibly second lipoma. 3. Consider further evaluation with MRI.  Discussed results with Dr. Waddell and we will go ahead with ordering an MRI for the patient.  This was discussed with the patient and she  expressed understanding.  We also did discuss the possibility of depending on the results of the MRI, her surgery may be canceled or delayed.  She expressed understanding.   Summary of Previous Visit: Patient was initially seen on 11/27/2023.  At this visit, patient reported a bump on her left arm for about a year which was growing in size.  Plan was to move forward with excision  Job: Home care, planning to take 1 week off.  Patient understands that she will not be able to do any sort of heavy lifting for minimum of 4 to 6 weeks.  PMH Significant for: Hypertension, migraines, GERD, type 2 diabetes, mass to the left upper extremity   Past Medical History: Allergies: Allergies  Allergen Reactions   Corn-Containing Products Hives    really bad asthma attacks   Honey Shortness Of Breath   Mango Flavoring Agent (Non-Screening) Hives and Swelling    Pt allergic to all mango - tongue and throat swelling   Glipizide      Other reaction(s): Other bad yeast infection; she feels she can take with probiotics   Liraglutide Nausea And Vomiting    victoza   Semaglutide  Nausea Only    gastroparesis    Current Medications:  Current Outpatient Medications:    albuterol  (PROVENTIL ) (2.5 MG/3ML) 0.083% nebulizer solution, USE 1 VIAL VIA NEBULIZER EVERY 6 HOURS AS NEEDED FOR WHEEZING OR SHORTNESS OF BREATH, Disp: 75 mL, Rfl: 6   albuterol  (VENTOLIN   HFA) 108 (90 Base) MCG/ACT inhaler, INHALE 2 PUFFS BY MOUTH INTO THE LUNGS EVERY 4 HOURS AS NEEDED FOR WHEEZING OR SHORTNESS OF BREATH, Disp: 6.7 g, Rfl: 2   amitriptyline  (ELAVIL ) 25 MG tablet, TAKE 1 TABLET BY MOUTH AT BEDTIME, Disp: 30 tablet, Rfl: 0   Bacillus Coagulans-Inulin (PROBIOTIC-PREBIOTIC PO), Take 1 capsule by mouth daily., Disp: , Rfl:    benzonatate  (TESSALON ) 100 MG capsule, Take 1 capsule (100 mg total) by mouth every 8 (eight) hours., Disp: 21 capsule, Rfl: 0   cetirizine  (ZYRTEC ) 10 MG tablet, TAKE 1 TABLET BY MOUTH EVERY DAY, Disp: 30  tablet, Rfl: 11   cholecalciferol (VITAMIN D3) 25 MCG (1000 UNIT) tablet, Take 1,000 Units by mouth daily., Disp: , Rfl:    cholestyramine  (QUESTRAN ) 4 g packet, Take 1 packet (4 g total) by mouth 2 (two) times daily., Disp: 60 packet, Rfl: 0   Continuous Glucose Sensor (FREESTYLE LIBRE 3 PLUS SENSOR) MISC, Change sensor every 15 days., Disp: 6 each, Rfl: 3   ELDERBERRY PO, Take 1 tablet by mouth daily. She does not  know the strength, Disp: , Rfl:    famotidine  (PEPCID ) 20 MG tablet, Take 1 tablet (20 mg total) by mouth 2 (two) times daily., Disp: 60 tablet, Rfl: 2   fluticasone -salmeterol (ADVAIR DISKUS) 100-50 MCG/ACT AEPB, Inhale 1 puff into the lungs 2 (two) times daily., Disp: 60 each, Rfl: 0   insulin  glargine, 2 Unit Dial , (TOUJEO  MAX SOLOSTAR) 300 UNIT/ML Solostar Pen, Inject 90 Units into the skin daily. May titrate up to 120 units daily if instructed by your provider., Disp: 12 mL, Rfl: 11   insulin  lispro (HUMALOG ) 100 UNIT/ML KwikPen, INJECT 16-22 UNITS into THE SKIN 3 TIMES DAILY BEFORE MEALS, Disp: 30 mL, Rfl: 1   Insulin  Pen Needle (PEN NEEDLES) 31G X 8 MM MISC, Use to inject insulin  4 times daily, Disp: 200 each, Rfl: 3   JANUVIA  100 MG tablet, TAKE 1 TABLET BY MOUTH EVERY DAY, Disp: 90 tablet, Rfl: 1   JARDIANCE  25 MG TABS tablet, Take 1 tablet (25 mg total) by mouth daily., Disp: 90 tablet, Rfl: 1   lactobacillus acidophilus (BACID) TABS tablet, Take 1 tablet by mouth daily., Disp: , Rfl:    lipase/protease/amylase (CREON ) 36000 UNITS CPEP capsule, Take 2 capsules (72,000 Units total) by mouth 3 (three) times daily with meals. May also take 1 capsule (36,000 Units total) as needed (with snacks)., Disp: 240 capsule, Rfl: 11   meloxicam  (MOBIC ) 15 MG tablet, Take 1 tablet (15 mg total) by mouth daily as needed for pain., Disp: 30 tablet, Rfl: 0   metFORMIN  (GLUCOPHAGE ) 500 MG tablet, TAKE 1 TABLET BY MOUTH 2 TIMES DAILY WITH A meal, Disp: 180 tablet, Rfl: 3   mometasone  (NASONEX ) 50  MCG/ACT nasal spray, Place 2 sprays into the nose daily., Disp: 1 each, Rfl: 12   montelukast  (SINGULAIR ) 10 MG tablet, TAKE 1 TABLET BY MOUTH EVERY DAY, Disp: 30 tablet, Rfl: 0   Multiple Vitamin (MULTIVITAMIN WITH MINERALS) TABS tablet, Take 1 tablet by mouth daily., Disp: , Rfl:    olmesartan -hydrochlorothiazide (BENICAR  HCT) 40-25 MG tablet, TAKE 1 TABLET BY MOUTH EVERY DAY, Disp: 90 tablet, Rfl: 0   pantoprazole  (PROTONIX ) 40 MG tablet, TAKE 1 TABLET BY MOUTH 2 TIMES DAILY BEFORE A MEAL, Disp: 60 tablet, Rfl: 1   rizatriptan  (MAXALT -MLT) 10 MG disintegrating tablet, Take 1 tablet (10 mg total) by mouth as needed for migraine. May repeat in 2 hours if needed,  Disp: 12 tablet, Rfl: 0   rosuvastatin  (CRESTOR ) 10 MG tablet, Take 1 tablet (10 mg total) by mouth daily., Disp: 90 tablet, Rfl: 3   terconazole  (TERAZOL 7 ) 0.4 % vaginal cream, Place 1 applicator vaginally at bedtime. Use for seven days, Disp: 45 g, Rfl: 0   TURMERIC PO, Take 1 capsule by mouth daily. She does not know the strength, Disp: , Rfl:   Past Medical Problems: Past Medical History:  Diagnosis Date   Allergy    Foods:  mango, honey, corn.  Allergic to everything tested  15 years ago in [place]   Anemia    with pregnancy   Anxiety    Arthritis    Shoulder and hips   Asthma age 48 yo   Triggers:  cold weather, exercise, allergens, anxiety, URIs   Cholelithiasis age 56 yo   Asymptomatic   Closed avulsion fracture of lateral malleolus of right fibula 12/27/2016   COVID-19    Diabetes mellitus type 2 2012   Was diagnosed 2 years earlier with PCOS   Elevated liver enzymes 09/13/2016   none since 2018   GERD (gastroesophageal reflux disease)    Heart murmur    with pregnancy 20 yrs ago per pt on 10-12-2021   History of kidney stones    Hypertension 2015   Kidney stones    family history of renal failure   Migraines 2009   Morbid obesity (HCC)    PCOS (polycystic ovarian syndrome) 2010   Pneumonia    several years  ago   Wears glasses     Past Surgical History: Past Surgical History:  Procedure Laterality Date   CHOLECYSTECTOMY N/A 12/01/2019   Procedure: LAPAROSCOPIC CHOLECYSTECTOMY;  Surgeon: Eletha Boas, MD;  Location: [facility];  Service: General;  Laterality: N/A;   colonscopy and endoscopy  05/25/2021   COLPOSCOPY N/A 2021   CYST EXCISION     DILITATION & CURRETTAGE/HYSTROSCOPY WITH NOVASURE ABLATION N/A 10/18/2021   Procedure: DILATATION & CURETTAGE/HYSTEROSCOPY WITH NOVASURE ABLATION;  Surgeon: Izell Harari, MD;  Location: [facility];  Service: Gynecology;  Laterality: N/A;   LAPAROSCOPIC TUBAL LIGATION Bilateral 01/28/2019   Procedure: LAPAROSCOPIC TUBAL LIGATION AND PAP SMEAR;  Surgeon: Izell Harari, MD;  Location: [facility];  Service: Gynecology;  Laterality: Bilateral;   TONSILLECTOMY AND ADENOIDECTOMY Bilateral 12/23/2018   Procedure: TONSILLECTOMY AND ADENOIDECTOMY;  Surgeon: Karis Clunes, MD;  Location: [facility];  Service: ENT;  Laterality: Bilateral;    Social History: Social History   Socioeconomic History   Marital status: Divorced    Spouse name: Not on file   Number of children: 1   Years of education: some comm. college   Highest education level: Not on file  Occupational History   Occupation: CNA    Comment: [employer]  Tobacco Use   Smoking status: Never   Smokeless tobacco: Never  Vaping Use   Vaping status: Never Used  Substance and Sexual Activity   Alcohol use: No   Drug use: No   Sexual activity: Not Currently    Birth control/protection: Surgical    Comment: Sprintec.  Tubal ligation  Other Topics Concern   Not on file  Social History Narrative   Originally from [place], [place] to [place] in 1988.   In [place] in 1999   Lives at home with daughter on [place].   Social Drivers of Corporate investment banker Strain: Not on [employer]  Insecurity: No Food  Insecurity (08/26/2023)   Hunger Vital Sign    Worried About Running Out of Food in the Last Year: Never true    Ran Out of Food in the Last Year: Never true  Transportation Needs: No Transportation Needs (08/26/2023)   PRAPARE - Administrator, Civil Service (Medical): No    Lack of Transportation (Non-Medical): No  Physical Activity: Not on file  Stress: Not on file  Social Connections: Unknown (09/05/2021)   Received [facility]   Social Network    Social Network: Not on file  Intimate Partner Violence: Unknown (07/28/2021)   Received from [facility]   HITS    Physically Hurt: Not on file    Insult or Talk Down To: Not on file    Threaten Physical Harm: Not on file    Scream or Curse: Not on file    Family History: Family History  Problem Relation Age of Onset   Healthy Mother    Diabetes Father    Hyperlipidemia Father    Hypertension Father    Heart disease Father    Kidney disease Father        Developed after 2nd CABG   Colon polyps Father    Diabetes Paternal Grandfather    Heart disease Paternal Grandfather    Hyperlipidemia Paternal Grandfather    Hypertension Paternal Grandfather    Kidney disease Paternal Grandfather     Review of Systems: Denies any recent fevers, chills or changes in her health  Physical Exam: Vital Signs BP 118/79 (BP Location: Left Arm, Patient Position: Sitting, Cuff Size: Large)   Pulse 81   Wt 260 lb (117.9 kg)   LMP 10/14/2021 (Exact Date)   SpO2 98%   BMI 43.27 kg/m   Physical Exam  Constitutional:      General: Not in acute distress.    Appearance: Normal appearance. Not ill-appearing.  HENT:     Head: Normocephalic and atraumatic.  Neck:     Musculoskeletal: Normal range of motion.  Cardiovascular:     Rate and Rhythm: Normal rate Pulmonary:     Effort: Pulmonary effort is normal. No respiratory distress.  Musculoskeletal: Normal range of motion.  Skin:    General: Skin is warm and dry.      Findings: No erythema or rash.  Mass to the left upper extremity Neurological:     Mental Status: Alert and oriented to person, place, and time. Mental status is at baseline.  Psychiatric:        Mood and Affect: Mood normal.        Behavior: Behavior normal.    Assessment/Plan: The patient is scheduled for excision of left upper extremity mass with Dr. Waddell.  Risks, benefits, and alternatives of procedure discussed, questions answered and consent obtained.    Smoking Status: Non-smoker; Counseling Given?  N/A  Caprini Score: 6; Risk Factors include: Age, BMI > 25, insulin -dependent diabetes, history of 3 miscarriages and length of planned surgery. Recommendation for mechanical prophylaxis. Encourage early ambulation.   Pictures obtained: @consult   Post-op Rx sent to pharmacy: Will hold off on sending medications until we get results of the MRI back and surgeries planned for sure.  Will plan on sending Phenergan  as patient states that she does not like the side effects of Zofran .  Will also send in oxycodone  for pain.  Instructed the patient to hold her amitriptyline , cholestyramine , Jardiance , rizatriptan , metformin , and olmesartan  the day of surgery.  Discussed with  her she needs to hold her meloxicam , multivitamins and supplements at least 1 week prior to surgery.  Discussed with patient that she may take half the dose of her insulin  the night before surgery.  I did discuss with her that if her sugar does get low, she may have a little bit of a clear juice such as apple juice to help bring her sugar back up.  Patient states in the past she has had 2 hold half of her insulin  prior to surgery and feels comfortable with doing that.  Patient was provided with the General Surgical Risk consent document and Pain Medication Agreement prior to their appointment.  They had adequate time to read through the risk consent documents and Pain Medication Agreement. We also discussed them in person  together during this preop appointment. All of their questions were answered to their satisfaction.  Recommended calling if they have any further questions.  Risk consent form and Pain Medication Agreement to be scanned into patient's chart.  The consent was obtained with risks and complications reviewed which included bleeding, pain, scar, infection and the risk of anesthesia.  The patients questions were answered to the patients expressed satisfaction.   I discussed with the patient that given her history of diabetes, she is at increased risk of wound healing issues postoperatively.  She expressed understanding.  Dr. Waddell also had the opportunity to meet the patient today and evaluate her.   Electronically signed by: Estefana FORBES Peck, PA-C 01/22/2024 5:05 PM

## 2024-01-25 ENCOUNTER — Ambulatory Visit (HOSPITAL_COMMUNITY)
Admission: RE | Admit: 2024-01-25 | Discharge: 2024-01-25 | Disposition: A | Source: Ambulatory Visit | Attending: Student

## 2024-01-25 DIAGNOSIS — R2232 Localized swelling, mass and lump, left upper limb: Secondary | ICD-10-CM | POA: Insufficient documentation

## 2024-01-25 MED ORDER — GADOBUTROL 1 MMOL/ML IV SOLN
10.0000 mL | Freq: Once | INTRAVENOUS | Status: AC | PRN
  Administered 2024-01-25: 10 mL via INTRAVENOUS

## 2024-01-27 ENCOUNTER — Other Ambulatory Visit: Payer: Self-pay

## 2024-01-27 ENCOUNTER — Other Ambulatory Visit: Payer: Self-pay | Admitting: Nurse Practitioner

## 2024-01-27 ENCOUNTER — Encounter (HOSPITAL_BASED_OUTPATIENT_CLINIC_OR_DEPARTMENT_OTHER): Payer: Self-pay | Admitting: Plastic Surgery

## 2024-01-27 DIAGNOSIS — M79622 Pain in left upper arm: Secondary | ICD-10-CM

## 2024-01-27 DIAGNOSIS — G4700 Insomnia, unspecified: Secondary | ICD-10-CM

## 2024-01-29 ENCOUNTER — Telehealth: Payer: Self-pay | Admitting: Student

## 2024-01-29 ENCOUNTER — Ambulatory Visit: Payer: Self-pay | Admitting: Nurse Practitioner

## 2024-01-29 NOTE — Telephone Encounter (Signed)
 Patient had MRI of the left upper extremity completed and results showed as followed:  IMPRESSION: 1. No suspicious soft tissue mass or abnormal enhancement identified. Clinical follow up of any palpable concern recommended. 2. The subcutaneous fat within the medial aspect of the antecubital fossa appears prominent and lobulated without well-defined subcutaneous lipoma. 3. No acute osseous findings or significant arthropathic changes.  I reviewed the results with Dr. Waddell and given results and patient's symptoms, it was felt that surgery would not benefit this patient and her issue.  I called the patient and discussed this with her.  She expressed understanding.  Discussed with her that we will go ahead and cancel surgery.  Recommended that she follow-up with her PCP and consider possible neurology referral given symptoms.  Patient expressed understanding.  Message was sent to surgery scheduler to cancel patient's surgery.

## 2024-02-03 ENCOUNTER — Ambulatory Visit (HOSPITAL_BASED_OUTPATIENT_CLINIC_OR_DEPARTMENT_OTHER): Admission: RE | Admit: 2024-02-03 | Source: Home / Self Care | Admitting: Plastic Surgery

## 2024-02-03 DIAGNOSIS — E119 Type 2 diabetes mellitus without complications: Secondary | ICD-10-CM

## 2024-02-03 SURGERY — EXCISION MASS UPPER EXTREMITIES
Anesthesia: Choice | Laterality: Left

## 2024-02-10 ENCOUNTER — Encounter: Admitting: Plastic Surgery

## 2024-02-11 ENCOUNTER — Other Ambulatory Visit: Payer: Self-pay | Admitting: Gastroenterology

## 2024-02-12 ENCOUNTER — Encounter: Admitting: Student

## 2024-02-21 ENCOUNTER — Telehealth: Payer: Self-pay

## 2024-02-21 ENCOUNTER — Other Ambulatory Visit: Payer: Self-pay

## 2024-02-21 ENCOUNTER — Other Ambulatory Visit (HOSPITAL_COMMUNITY): Payer: Self-pay

## 2024-02-21 NOTE — Progress Notes (Unsigned)
 02/21/2024 Name: Lori Shea MRN: [id number] DOB: April 19, 1976  No chief complaint on file.   Lori Shea is a 48 y.o. year old female who presented for a telephone visit.   They were referred to the pharmacist by their PCP for assistance in managing diabetes. 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 A1C increased from 8.2% to 8.6%. LDL-C decreased from 116 mg/dL to 40 mg/dL after restarting rosuvastatin  10 mg daily. She was engaged by pharmacy via telephone on 09/30/23. At this appointment she was instructed to switch from Lantus  to Toujeo  U-300. She was also instructed to continue Humalog  5 units with breakfast and dinner and increase to 8 units with dinner, with instructions to continue titrating dinner insulin  dose up to a maximum of 20 units. At  pharmacy telephone appt on 11/25/23, patient reported that she successfully switched to Toujeo  U-300, but when she tried to increase her Humalog  dose at meals she felt it made her BG spike more than usual and stay elevated overnight. States her sugars returned to baseline when she decreased back to Humalog  5 units with her meals. A referral was placed for her to schedule with the endocrinology office in [place] rather than [place]. She also reported she had been dealing with a UTI that could have been contributing to higher sugars. She was instructed to titrate her basal insulin  up to a maximum of 90 units nightly. Appears since then, she has been seen for ongoing URI and was recently prescribed a course of Augmentin . At pharmacy telephone appt on 12/19/23, pt reported recent URI causing elevated BG. She had recently been seen by GI who recommended taking Creon  with meals for exocrine pancreatic insufficiency. She had not increased to 86 units of insulin  daily as previously  instructed, so was instructed to increase as previously discusssed. She had a CT abd/pelvis on 12/18/23 that demonstrated hepatic steatosis and fatty atrophy of the pancreas with no inflammatory stranding or significant change compared to 2020. At pharmacy call on 01/17/24, patient was instructed to increase Toujeo  U300 to 90 units daily.  Today, patient reports doing ok. The Creon  has helped her GI symptoms immensely. She had a stomach bug yesterday and had persistently elevated BG, but otherwise reports that the increase in basal insulin  helped control her sugars. She reports that she has continued Humalog  5 units with  meals, but occasionally, if BG remains elevated 2 hours after eating, she will give herself another 5 units. She has not scheduled with endo yet.  Xigduo XR with increased dose of metformin ? > 90ds is $0 Vs continuing to increase basal insulin   Care Team: Primary Care Provider: Borer Bascom RAMAN, NP ; Next Scheduled Visit: needs to reschedule Endocrinology: Benton Rio, NP; Patient cancelled, would like to schedule with [facility]ology: Aloha Finner, MD: Scheduled 11/22/23  Medication Access/Adherence  Current Pharmacy:  [facility] - [place], [place] - [address] [place] [address] Phone: [phone] Fax: [phone]  [id number] [place],  - [address] AT [facility] [address] [facility] [address] [place] [address] Phone: [phone] Fax: [phone]   Patient reports affordability concerns with their medications: No  Patient reports access/transportation concerns to their pharmacy: No  Patient reports adherence concerns with their medications:  No  Diabetes:  Current medications: Jardiance  25 mg daily, Toujeo  U300 86 units once daily at bedtime, Humalog  16-22 units TID (patient takes 5 units with meals - typically takes with her first bite), Januvia  100 mg daily,  metformin  IR 500 mg BID  Medications tried in the past: metformin  1000 mg BID - GI AE, glipizide  - yeast infection, liraglutide - n/v, Rybelsus  - n/v and gastroparesis  Denies UTI/yeast infections recently with Jardiance . Reported that she did not take Jardiance  yesterday given nausea/vomiting.  Current glucose readings:  Using Freestyle Libre 3 Plus CGM - sensors have been working well since our last appt  Date of Download: 01/04/24 to 01/17/24       Patient denies hypoglycemic s/sx including dizziness, shakiness, sweating. Patient reports hyperglycemic symptoms including irritability, anxious. Denies polyuria, polydipsia, polyphagia, nocturia, neuropathy, blurred vision.  Current meal patterns: Digestion issues have improved since starting Creon . - Breakfast: protein shake with a fruit (apple, banana) - usually is not hungry in the morning - Lunch: healthy lunch available at work - protein with vegetables - Supper - eats dinner around 5-5:30PM. Has a protein, starch (1/2 cup of rice, mashed potatoes), vegetables. Does usually have something sweet.  - Snacks - none - Drinks - drinking gallon of water daily  Current physical activity: Walking 2.5 miles daily  Current medication access support: none  Hypertension:  Current medications: olmesartan -hydrochlorothiazide 40-25 mg daily  Hyperlipidemia/ASCVD Risk Reduction  Current lipid lowering medications: rosuvastatin  10 mg daily Medications tried in the past: simvastatin  - d/c in the setting of drug recall  Clinical ASCVD: No  The ASCVD Risk score (Arnett DK, et al., 2019) failed to calculate for the following reasons:   The valid total cholesterol range is 130 to 320 mg/dL    Objective:  BP Readings from Last 3 Encounters:  01/22/24 118/79  11/27/23 117/83  11/23/23 114/80    Lab Results  Component Value Date   HGBA1C 8.6 (H) 10/28/2023    Lab Results  Component Value Date   CREATININE 0.90 07/26/2023   BUN 16  07/26/2023   NA 141 07/26/2023   K 4.1 07/26/2023   CL 102 07/26/2023   CO2 24 07/26/2023    Lab Results  Component Value Date   CHOL 113 10/28/2023   HDL 35 (L) 10/28/2023   LDLCALC 40 10/28/2023   TRIG 244 (H) 10/28/2023   CHOLHDL 3.2 10/28/2023    EXAM: NUCLEAR MEDICINE GASTRIC EMPTYING SCAN - August 2024   TECHNIQUE: After oral ingestion of radiolabeled meal, sequential abdominal images were obtained for 4 hours. Percentage of activity emptying the stomach was calculated at 1 hour, 2 hour, 3 hour, and 4 hours.   RADIOPHARMACEUTICALS:  2.1 mCi Tc-68m sulfur  colloid in standardized meal   COMPARISON:  CT February 01, 2019   FINDINGS: Expected location of the stomach in the left upper quadrant. Ingested meal empties the stomach gradually over the course of the study.   0% emptied at 1 hr ( normal >= 10%)   4% emptied at 2 hr ( normal >= 40%)   7% emptied at 3 hr ( normal >= 70%)   18% emptied at 4 hr ( normal >= 90%)   IMPRESSION: Scintigraphic findings compatible with delayed gastric emptying  Medications Reviewed Today   Medications were not reviewed in this encounter       Assessment/Plan:   Diabetes: - Currently uncontrolled with last A1C of 8.6% above goal < 7%, but improved from 9.3%. Glycemic control per AGP  slightly improved from previous with increase in Toujeo . Would recommend continued titration. Typically would target basal/bolus split of 50/50 or 40/60, but patient continues to report that if she injects > 5 units of prandial insulin  when she starts eating it causes BG to become more elevated. With hx of gastroparesis, will should consider initiation of extended or split bolus dosing with meals. Pt does not have evidence of pancreatitis on CT scan, so it is appropriate to continue DPP4i, though unsure how much glycemic benefit she is getting. Continued to emphasize that patient would benefit from appt with endocrinology to discuss transitioning to  U500 insulin . Given evidence of insulin  resistance, will continue to titrate basal insulin .  - Reviewed long term cardiovascular and renal outcomes of uncontrolled blood sugar - Reviewed goal A1c, goal fasting, and goal 2 hour post prandial glucose - Reviewed dietary modifications including controlling portion size of carbohydrate heavy foods. Commended for excellent adherence to healthy diet.  - Reviewed lifestyle modifications including: continuing regular physical activity. Congratulated on progress with walking 2.5 miles daily.  - Recommend to continue Jardiance  25 mg daily, Januvia  100 mg daily, metformin  IR 500 mg BID  - Recommend to increase to Toujeo  U300 90 units nightly. Patient using $35 copay card for Toujeo .  - Recommend to continue Humalog  5 units TID with meals. Instructed patient that if she has a larger meal, she can inject an additional 5 units 1-2 hours after her meal if BG are still elevated due to hx of gastroparesis. - Reminded patient to schedule with endocrinology office in [place].  - Patient denies personal or family history of multiple endocrine neoplasia type 2, medullary thyroid cancer; personal history of pancreatitis or gallbladder disease. - Recommend to check glucose continuously with FL3+ CGM.    Hypertension: - Currently controlled - Reviewed long term cardiovascular and renal outcomes of uncontrolled blood pressure - Recommend to continue olmesartan -hydrochlorothiazide 40-25 mg daily    Hyperlipidemia/ASCVD Risk Reduction: - Currently controlled with last LDL-C of 40 mg/dL below goal < 70 mg/dL given U7IF with comorbidities - Reviewed long term complications of uncontrolled cholesterol - Recommend to continue rosuvastatin  10 mg daily    Follow Up Plan: PCP needs to reschedule, PharmD telephone 02/21/24, Endo needs to be scheduled  Lorain Baseman, PharmD [facility] [phone]

## 2024-02-21 NOTE — Progress Notes (Signed)
 Attempted to contact patient for scheduled appointment for medication management. Left HIPAA compliant message for patient to return my call at their convenience.   Conducted remote review of patient's LibreView report, which is slightly improved from previous visit. Likely need to continue titration of insulin  and/or non-insulin  antihyperglycemics (like metformin ). Will discuss with patient when we are able to reschedule appt. I see that she was able to schedule with Dr. Mercie (Endo) in Jan 2026.      Lorain Baseman, PharmD [facility] [phone]

## 2024-02-24 ENCOUNTER — Encounter: Admitting: Student

## 2024-03-02 ENCOUNTER — Other Ambulatory Visit: Payer: Self-pay | Admitting: Nurse Practitioner

## 2024-03-02 DIAGNOSIS — J4530 Mild persistent asthma, uncomplicated: Secondary | ICD-10-CM

## 2024-03-03 ENCOUNTER — Other Ambulatory Visit: Payer: Self-pay | Admitting: Nurse Practitioner

## 2024-03-03 DIAGNOSIS — G4700 Insomnia, unspecified: Secondary | ICD-10-CM

## 2024-03-03 DIAGNOSIS — M79622 Pain in left upper arm: Secondary | ICD-10-CM

## 2024-03-03 NOTE — Telephone Encounter (Signed)
 Please advise [facility]

## 2024-03-09 ENCOUNTER — Other Ambulatory Visit: Payer: Self-pay | Admitting: Nurse Practitioner

## 2024-03-09 ENCOUNTER — Encounter: Admitting: Student

## 2024-03-09 DIAGNOSIS — K219 Gastro-esophageal reflux disease without esophagitis: Secondary | ICD-10-CM

## 2024-03-25 ENCOUNTER — Ambulatory Visit: Admitting: Nurse Practitioner

## 2024-03-25 ENCOUNTER — Other Ambulatory Visit: Payer: Self-pay | Admitting: Nurse Practitioner

## 2024-03-25 ENCOUNTER — Encounter: Payer: Self-pay | Admitting: Nurse Practitioner

## 2024-03-25 VITALS — BP 109/59 | HR 80 | Wt 254.2 lb

## 2024-03-25 DIAGNOSIS — R52 Pain, unspecified: Secondary | ICD-10-CM | POA: Diagnosis not present

## 2024-03-25 DIAGNOSIS — Z794 Long term (current) use of insulin: Secondary | ICD-10-CM | POA: Diagnosis not present

## 2024-03-25 DIAGNOSIS — T148XXA Other injury of unspecified body region, initial encounter: Secondary | ICD-10-CM | POA: Diagnosis not present

## 2024-03-25 DIAGNOSIS — R350 Frequency of micturition: Secondary | ICD-10-CM | POA: Diagnosis not present

## 2024-03-25 DIAGNOSIS — E1165 Type 2 diabetes mellitus with hyperglycemia: Secondary | ICD-10-CM

## 2024-03-25 LAB — POCT URINE DIPSTICK
Bilirubin, UA: NEGATIVE
Blood, UA: NEGATIVE
Glucose, UA: >=1000 mg/dL — AB
Leukocytes, UA: NEGATIVE
Nitrite, UA: NEGATIVE
POC PROTEIN,UA: NEGATIVE
Spec Grav, UA: 1.015 (ref 1.010–1.025)
Urobilinogen, UA: 0.2 U/dL
pH, UA: 5.5 (ref 5.0–8.0)

## 2024-03-25 LAB — POCT GLYCOSYLATED HEMOGLOBIN (HGB A1C): Hemoglobin A1C: 8.6 % — AB (ref 4.0–5.6)

## 2024-03-25 MED ORDER — KETOROLAC TROMETHAMINE 60 MG/2ML IM SOLN
60.0000 mg | Freq: Once | INTRAMUSCULAR | Status: AC
  Administered 2024-03-25: 60 mg via INTRAMUSCULAR

## 2024-03-25 MED ORDER — MUPIROCIN 2 % EX OINT: 1.0000 | TOPICAL_OINTMENT | Freq: Two times a day (BID) | CUTANEOUS | 0 refills | Status: AC
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
## 2024-03-25 NOTE — Telephone Encounter (Signed)
 Pt was made an appt for today.   Suzen Shove   CMA II

## 2024-03-27 ENCOUNTER — Other Ambulatory Visit: Payer: Self-pay

## 2024-03-27 DIAGNOSIS — J45909 Unspecified asthma, uncomplicated: Secondary | ICD-10-CM

## 2024-03-27 MED ORDER — METHOCARBAMOL 500 MG PO TABS: 500.0000 mg | ORAL_TABLET | Freq: Three times a day (TID) | ORAL | 2 refills | Status: DC | PRN

## 2024-03-27 MED ORDER — IBUPROFEN 800 MG PO TABS: 800.0000 mg | ORAL_TABLET | Freq: Three times a day (TID) | ORAL | 2 refills | Status: AC

## 2024-03-27 MED ORDER — FLUTICASONE-SALMETEROL 100-50 MCG/ACT IN AEPB: 1.0000 | INHALATION_SPRAY | Freq: Two times a day (BID) | RESPIRATORY_TRACT | 0 refills | Status: DC

## 2024-03-27 NOTE — Telephone Encounter (Signed)
 Per pt , Good morning Dr.Nichols could please send me in some refills on the Ibuprofen  and methocarbamol ,and advair?Please send to [facility].

## 2024-03-30 ENCOUNTER — Other Ambulatory Visit: Payer: Self-pay | Admitting: Nurse Practitioner

## 2024-03-30 NOTE — Telephone Encounter (Signed)
 Please advise [facility]

## 2024-04-07 ENCOUNTER — Other Ambulatory Visit: Payer: Self-pay | Admitting: Nurse Practitioner

## 2024-04-07 DIAGNOSIS — M79622 Pain in left upper arm: Secondary | ICD-10-CM

## 2024-04-07 DIAGNOSIS — J4530 Mild persistent asthma, uncomplicated: Secondary | ICD-10-CM

## 2024-04-07 DIAGNOSIS — G4700 Insomnia, unspecified: Secondary | ICD-10-CM

## 2024-04-07 NOTE — Telephone Encounter (Signed)
 Please advise [facility]

## 2024-04-08 ENCOUNTER — Other Ambulatory Visit: Payer: Self-pay | Admitting: Gastroenterology

## 2024-04-21 ENCOUNTER — Ambulatory Visit: Payer: Self-pay
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
## 2024-04-21 NOTE — Telephone Encounter (Signed)
 This RN attempted x1 to contact patient, no answer, left voicemail message. Will place in Call Back folder.
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
## 2024-04-21 NOTE — Telephone Encounter (Signed)
 Pt went to ER. Just FYI. KH
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
## 2024-04-27 ENCOUNTER — Other Ambulatory Visit: Payer: Self-pay | Admitting: Nurse Practitioner

## 2024-04-27 DIAGNOSIS — M79622 Pain in left upper arm: Secondary | ICD-10-CM

## 2024-04-27 DIAGNOSIS — G4700 Insomnia, unspecified: Secondary | ICD-10-CM

## 2024-04-27 NOTE — Telephone Encounter (Signed)
 meloxicam  (MOBIC ) 15 MG tablet [Pharmacy Med Name: meloxicam  15 mg tablet]     amitriptyline  (ELAVIL ) 25 MG tablet [Pharmacy Med Name: amitriptyline  25 mg tablet]

## 2024-04-27 NOTE — Telephone Encounter (Signed)
 Please Advise.  CB.

## 2024-04-28 ENCOUNTER — Other Ambulatory Visit: Payer: Self-pay | Admitting: Nurse Practitioner

## 2024-04-28 DIAGNOSIS — J4530 Mild persistent asthma, uncomplicated: Secondary | ICD-10-CM

## 2024-04-28 DIAGNOSIS — I10 Essential (primary) hypertension: Secondary | ICD-10-CM

## 2024-04-30 ENCOUNTER — Ambulatory Visit: Admitting: Endocrinology

## 2024-05-01 ENCOUNTER — Encounter: Payer: Self-pay | Admitting: *Deleted

## 2024-05-01 ENCOUNTER — Ambulatory Visit: Payer: Self-pay | Attending: *Deleted | Admitting: *Deleted
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
## 2024-05-12 ENCOUNTER — Other Ambulatory Visit: Payer: Self-pay | Admitting: Nurse Practitioner

## 2024-05-12 DIAGNOSIS — K219 Gastro-esophageal reflux disease without esophagitis: Secondary | ICD-10-CM

## 2024-05-13 ENCOUNTER — Other Ambulatory Visit: Payer: Self-pay | Admitting: Nurse Practitioner

## 2024-05-13 DIAGNOSIS — J45909 Unspecified asthma, uncomplicated: Secondary | ICD-10-CM

## 2024-05-13 NOTE — Telephone Encounter (Signed)
 Please advise [facility]

## 2024-05-13 NOTE — Telephone Encounter (Signed)
 fluticasone -salmeterol (ADVAIR) 100-50 MCG/ACT AEPB [Pharmacy Med Name: fluticasone  100 mcg-salmeterol 50 mcg/dose blistr powdr for inhalation]

## 2024-05-14 ENCOUNTER — Telehealth: Payer: Self-pay

## 2024-05-14 ENCOUNTER — Other Ambulatory Visit: Payer: Self-pay

## 2024-05-14 NOTE — Telephone Encounter (Signed)
 Pharmacy Patient Advocate Encounter   Received notification from CoverMyMeds that prior authorization for TOUJEO  MAX is required/requested.   Insurance verification completed.   The patient is insured through [facility] [employer].   Per test claim: PA required; PA submitted to above mentioned insurance via CoverMyMeds Key/confirmation #/EOC [id number] Status is pending

## 2024-05-15 ENCOUNTER — Other Ambulatory Visit: Payer: Self-pay

## 2024-05-15 ENCOUNTER — Other Ambulatory Visit: Payer: Self-pay | Admitting: Nurse Practitioner

## 2024-05-15 DIAGNOSIS — J4530 Mild persistent asthma, uncomplicated: Secondary | ICD-10-CM

## 2024-05-15 DIAGNOSIS — G4700 Insomnia, unspecified: Secondary | ICD-10-CM

## 2024-05-15 NOTE — Telephone Encounter (Signed)
 Pharmacy Patient Advocate Encounter  Received notification from [employer] that Prior Authorization for TOUJEO  MAX has been APPROVED from 05/14/2024 to 05/14/2025   PA #/Case ID/Reference #: [id number]

## 2024-05-15 NOTE — Telephone Encounter (Signed)
 Please advise [facility]

## 2024-05-15 NOTE — Telephone Encounter (Signed)
 amitriptyline  (ELAVIL ) 25 MG tablet [Pharmacy Med Name: amitriptyline  25 mg tablet]      montelukast  (SINGULAIR ) 10 MG tablet [Pharmacy Med Name: montelukast  10 mg tablet]

## 2024-05-19 ENCOUNTER — Other Ambulatory Visit (HOSPITAL_COMMUNITY): Payer: Self-pay

## 2024-05-19 ENCOUNTER — Encounter: Payer: Self-pay | Admitting: Pharmacy Technician

## 2024-05-19 ENCOUNTER — Other Ambulatory Visit: Payer: Self-pay | Admitting: Nurse Practitioner

## 2024-05-19 ENCOUNTER — Telehealth: Payer: Self-pay | Admitting: Pharmacy Technician

## 2024-05-19 NOTE — Telephone Encounter (Signed)
 Pharmacy Patient Advocate Encounter   Received notification from [facility] KEY that prior authorization for FreeStyle Libre 3 Plus Sensor  is required/requested.   Insurance verification completed.   The patient is insured through [facility] [employer].   Per test claim: PA required; PA submitted to above mentioned insurance via Latent Key/confirmation #/EOC [id number] Status is pending

## 2024-05-19 NOTE — Telephone Encounter (Signed)
 methocarbamol  (ROBAXIN ) 500 MG tablet [Pharmacy Med Name: methocarbamol  500 mg tablet]

## 2024-05-19 NOTE — Telephone Encounter (Signed)
 Please Advise.  CB.

## 2024-05-19 NOTE — Telephone Encounter (Signed)
 error

## 2024-05-20 ENCOUNTER — Telehealth: Payer: Self-pay | Admitting: *Deleted

## 2024-05-20 NOTE — Telephone Encounter (Signed)
 Orvil with [facility] called and left a message asking about the patients PA for the [facility] 3+ sensors that was sent over last week.  I returned her call and left a VM sharing that our PA team and started this PA on yesterday, it is now pending. Once we hear something we will let them know.

## 2024-05-21 ENCOUNTER — Other Ambulatory Visit (HOSPITAL_COMMUNITY): Payer: Self-pay

## 2024-05-21 ENCOUNTER — Telehealth: Payer: Self-pay | Admitting: Pharmacy Technician

## 2024-05-21 NOTE — Telephone Encounter (Signed)
 Pharmacy Patient Advocate Encounter  Wrong form was picked in the beginning for this PA and it was cancelled.      Changed form and resubmitted PA. Key: [id number]

## 2024-05-21 NOTE — Telephone Encounter (Signed)
 Pharmacy Patient Advocate Encounter  Received notification from [facility] [employer] that Prior Authorization for FreeStyle Libre 3 Plus Sensor  has been CANCELLED due to pharmacy benefits terminated as of 04/22/24.    PA #/Case ID/Reference #: [id number]  **Per eligibility check, pt has another insurance that pops up as well. See new encounter from 05/21/24.**

## 2024-05-21 NOTE — Telephone Encounter (Signed)
 Pharmacy Patient Advocate Encounter   Received notification from [facility] KEY that prior authorization for FreeStyle Libre 3 Plus Sensor  is required/requested.   Insurance verification completed.   The patient is insured through [facility].   Per test claim: PA required; PA submitted to above mentioned insurance via Latent Key/confirmation #/EOC [id number] Status is pending

## 2024-05-22 ENCOUNTER — Other Ambulatory Visit (HOSPITAL_COMMUNITY): Payer: Self-pay

## 2024-05-22 NOTE — Telephone Encounter (Signed)
Patient was called and a message was left with this information.

## 2024-05-22 NOTE — Telephone Encounter (Signed)
 Pharmacy Patient Advocate Encounter  Received notification from [facility] that Prior Authorization for FreeStyle Libre 3 Plus Sensor  has been APPROVED from 05/21/24 to 05/21/25. Ran test claim, Copay is $39.99. This test claim was processed through [facility]- copay amounts may vary at other pharmacies due to pharmacy/plan contracts, or as the patient moves through the different stages of their insurance plan.   PA #/Case ID/Reference #: [id number]

## 2024-06-24 ENCOUNTER — Ambulatory Visit: Payer: Self-pay | Admitting: Nurse Practitioner
